# Patient Record
Sex: Male | Born: 1970 | Race: Black or African American | Hispanic: No | Marital: Single | Smoking: Current every day smoker
Health system: Southern US, Community
[De-identification: ages and names within clinical notes are randomized; demographics above are authoritative.]

## PROBLEM LIST (undated history)

## (undated) DIAGNOSIS — E785 Hyperlipidemia, unspecified: Secondary | ICD-10-CM

## (undated) DIAGNOSIS — I739 Peripheral vascular disease, unspecified: Secondary | ICD-10-CM

## (undated) DIAGNOSIS — I10 Essential (primary) hypertension: Secondary | ICD-10-CM

## (undated) DIAGNOSIS — E119 Type 2 diabetes mellitus without complications: Secondary | ICD-10-CM

## (undated) DIAGNOSIS — Z720 Tobacco use: Secondary | ICD-10-CM

## (undated) HISTORY — DX: Essential (primary) hypertension: I10

## (undated) HISTORY — DX: Type 2 diabetes mellitus without complications: E11.9

## (undated) HISTORY — DX: Hyperlipidemia, unspecified: E78.5

---

## 2019-12-10 ENCOUNTER — Other Ambulatory Visit: Payer: Self-pay

## 2019-12-10 ENCOUNTER — Encounter (HOSPITAL_COMMUNITY): Payer: Self-pay

## 2019-12-10 ENCOUNTER — Ambulatory Visit (HOSPITAL_COMMUNITY)
Admission: EM | Admit: 2019-12-10 | Discharge: 2019-12-10 | Disposition: A | Discharge status: Home / Self Care | Payer: Self-pay | Attending: Family Medicine | Admitting: Family Medicine

## 2019-12-10 DIAGNOSIS — M722 Plantar fascial fibromatosis: Secondary | ICD-10-CM

## 2019-12-10 DIAGNOSIS — M79672 Pain in left foot: Secondary | ICD-10-CM

## 2019-12-10 DIAGNOSIS — R030 Elevated blood-pressure reading, without diagnosis of hypertension: Secondary | ICD-10-CM

## 2019-12-10 MED ORDER — MELOXICAM 15 MG PO TABS: 15.0000 mg | ORAL_TABLET | Freq: Every day | ORAL | 0 refills | Status: DC

## 2019-12-10 NOTE — Discharge Instructions (Addendum)
Your blood pressure was noted to be elevated during your visit today. If you are currently taking medication for high blood pressure, please ensure you are taking this as directed. If you do not have a history of high blood pressure and your blood pressure remains persistently elevated, you may need to begin taking a medication at some point. You may return here within the next few days to recheck if unable to see your primary care provider or if do not have a one.  BP (!) 209/101 (BP Location: Right Arm)   Pulse 85   Temp 98.6 F (37 C) (Oral)   Resp 16   SpO2 100%

## 2019-12-10 NOTE — ED Provider Notes (Signed)
[facility]   [id number] 12/10/19 Arrival Time: 1056  ASSESSMENT & PLAN:  1. Foot pain, left   2. Elevated blood pressure reading without diagnosis of hypertension   3. Plantar fasciitis     No indication for plain imaging of foot today. See AVS for written information given. No s/s of hypertensive urgency.  Begin: Meds ordered this encounter  Medications  . meloxicam (MOBIC) 15 MG tablet    Sig: Take 1 tablet (15 mg total) by mouth daily.    Dispense:  14 tablet    Refill:  0    Fitted with CAM boot. WBAT.  Recommend: Follow-up Information    [facility]   Specialty: Specialist Why: If worsening or failing to improve as anticipated. Contact information: [address] [place] [address] [phone]        Schedule an appointment as soon as possible for a visit  with [facility].   Why: To re-check your blood pressure. Contact information: [address] [place] [address] [phone]            Discharge Instructions     Your blood pressure was noted to be elevated during your visit today. If you are currently taking medication for high blood pressure, please ensure you are taking this as directed. If you do not have a history of high blood pressure and your blood pressure remains persistently elevated, you may need to begin taking a medication at some point. You may return here within the next few days to recheck if unable to see your primary care provider or if do not have a one.  BP (!) 209/101 (BP Location: Right Arm)   Pulse 85   Temp 98.6 F (37 C) (Oral)   Resp 16   SpO2 100%        Reviewed expectations re: course of current medical issues. Questions answered. Outlined signs and symptoms indicating need for more acute intervention. Patient verbalized understanding. After Visit Summary given.  SUBJECTIVE: History from: patient. Jeffrey Fitzgerald is a 49 y.o. male who  reports fairly persistent marked pain of his planter left foot; described as aching; without radiation. Onset: gradual. First noted: approx 4 mo ago. Injury/trama: no. Symptoms have waxed and waned since beginning but still present. Worse in the morning. Aggravating factors: weight bearing. Alleviating factors: have not been identified. Associated symptoms: none reported. Extremity sensation changes or weakness: none. Self treatment: occas OTC without much change in symptoms.  History of similar: no.  History reviewed. No pertinent surgical history.   Increased blood pressure noted today. Reports that he has not been treated for hypertension in the past.  He reports no chest pain on exertion, no dyspnea on exertion, no swelling of ankles, no orthostatic dizziness or lightheadedness, no orthopnea or paroxysmal nocturnal dyspnea, no palpitations and no intermittent claudication symptoms.   OBJECTIVE:  Vitals:   12/10/19 1149  BP: (!) 209/101  Pulse: 85  Resp: 16  Temp: 98.6 F (37 C)  TempSrc: Oral  SpO2: 100%    General appearance: alert; no distress HEENT: [place]; AT Neck: supple with FROM CV: reg Resp: unlabored respirations; speaks full sentences without difficulty Extremities: . LLE: warm with well perfused appearance; plantar foot with TTP over insertion of plantar fascia at heal; otherise unremarkable Skin: warm and dry; no visible rashes Neurologic: gait normal; normal sensation and strength of LLE Psychological: alert and cooperative; normal mood and affect    No Known  Allergies  History reviewed. No pertinent past medical history.   Social History   Socioeconomic History  . Marital status: Single    Spouse name: Not on file  . Number of children: Not on file  . Years of education: Not on file  . Highest education level: Not on file  Occupational History  . Not on file  Tobacco Use  . Smoking status: Current Every Day Smoker    Packs/day: 0.50    Types:  Cigarettes  . Smokeless tobacco: Never Used  Substance and Sexual Activity  . Alcohol use: Yes    Comment: occasionally  . Drug use: Not on file  . Sexual activity: Not on file  Other Topics Concern  . Not on file  Social History Narrative  . Not on file   Social Determinants of Health   Financial Resource Strain:   . Difficulty of Paying Living Expenses:   Food Insecurity:   . Worried About Charity fundraiser in the Last Year:   . Arboriculturist in the Last Year:   Transportation Needs:   . Film/video editor (Medical):   Marland Kitchen Lack of Transportation (Non-Medical):   Physical Activity:   . Days of Exercise per Week:   . Minutes of Exercise per Session:   Stress:   . Feeling of Stress :   Social Connections:   . Frequency of Communication with Friends and Family:   . Frequency of Social Gatherings with Friends and Family:   . Attends Religious Services:   . Active Member of Clubs or Organizations:   . Attends Archivist Meetings:   Marland Kitchen Marital Status:    No family history on file. History reviewed. No pertinent surgical history.    Vanessa Kick, MD 12/10/19 1324

## 2019-12-10 NOTE — ED Triage Notes (Signed)
Pt reports left food pain that started in December. States his mom and sister made him come today to get checked out d/t the fact he has had no relief

## 2020-01-09 ENCOUNTER — Ambulatory Visit: Payer: Self-pay

## 2020-01-09 ENCOUNTER — Telehealth: Payer: Self-pay | Admitting: *Deleted

## 2020-01-09 ENCOUNTER — Encounter: Payer: Self-pay | Admitting: General Practice

## 2020-01-09 NOTE — Telephone Encounter (Signed)
Call to patient about missed appointment .  Message left that Clinics had called and to call to reschedule if needed.  Angelina Ok, RN 01/09/2020 11:28 AM.

## 2020-01-12 ENCOUNTER — Emergency Department (HOSPITAL_COMMUNITY): Payer: Self-pay

## 2020-01-12 ENCOUNTER — Encounter (HOSPITAL_COMMUNITY): Payer: Self-pay | Admitting: Emergency Medicine

## 2020-01-12 ENCOUNTER — Other Ambulatory Visit: Payer: Self-pay

## 2020-01-12 ENCOUNTER — Emergency Department (HOSPITAL_COMMUNITY)
Admission: EM | Admit: 2020-01-12 | Discharge: 2020-01-12 | Disposition: A | Discharge status: Home / Self Care | Payer: Self-pay | Attending: Emergency Medicine | Admitting: Emergency Medicine

## 2020-01-12 DIAGNOSIS — R030 Elevated blood-pressure reading, without diagnosis of hypertension: Secondary | ICD-10-CM | POA: Insufficient documentation

## 2020-01-12 DIAGNOSIS — F17210 Nicotine dependence, cigarettes, uncomplicated: Secondary | ICD-10-CM | POA: Insufficient documentation

## 2020-01-12 DIAGNOSIS — M79672 Pain in left foot: Secondary | ICD-10-CM | POA: Insufficient documentation

## 2020-01-12 LAB — CBC WITH DIFFERENTIAL/PLATELET
Abs Immature Granulocytes: 0.03 10*3/uL (ref 0.00–0.07)
Basophils Absolute: 0 10*3/uL (ref 0.0–0.1)
Basophils Relative: 0 %
Eosinophils Absolute: 0.1 10*3/uL (ref 0.0–0.5)
Eosinophils Relative: 1 %
HCT: 48.1 % (ref 39.0–52.0)
Hemoglobin: 15.6 g/dL (ref 13.0–17.0)
Immature Granulocytes: 0 %
Lymphocytes Relative: 37 %
Lymphs Abs: 4.3 10*3/uL — ABNORMAL HIGH (ref 0.7–4.0)
MCH: 28.8 pg (ref 26.0–34.0)
MCHC: 32.4 g/dL (ref 30.0–36.0)
MCV: 88.7 fL (ref 80.0–100.0)
Monocytes Absolute: 0.9 10*3/uL (ref 0.1–1.0)
Monocytes Relative: 7 %
Neutro Abs: 6.4 10*3/uL (ref 1.7–7.7)
Neutrophils Relative %: 55 %
Platelets: 258 10*3/uL (ref 150–400)
RBC: 5.42 MIL/uL (ref 4.22–5.81)
RDW: 14.6 % (ref 11.5–15.5)
WBC: 11.7 10*3/uL — ABNORMAL HIGH (ref 4.0–10.5)
nRBC: 0 % (ref 0.0–0.2)

## 2020-01-12 LAB — BASIC METABOLIC PANEL
Anion gap: 11 (ref 5–15)
BUN: 15 mg/dL (ref 6–20)
CO2: 27 mmol/L (ref 22–32)
Calcium: 9.2 mg/dL (ref 8.9–10.3)
Chloride: 99 mmol/L (ref 98–111)
Creatinine, Ser: 1.31 mg/dL — ABNORMAL HIGH (ref 0.61–1.24)
GFR calc Af Amer: >60 mL/min (ref >60)
GFR calc non Af Amer: >60 mL/min (ref >60)
Glucose, Bld: 93 mg/dL (ref 70–99)
Potassium: 4.8 mmol/L (ref 3.5–5.1)
Sodium: 137 mmol/L (ref 135–145)

## 2020-01-12 MED ORDER — CYCLOBENZAPRINE HCL 10 MG PO TABS: 10.0000 mg | ORAL_TABLET | Freq: Every day | ORAL | 0 refills | Status: DC

## 2020-01-12 MED ORDER — AMLODIPINE BESYLATE 5 MG PO TABS: 5.0000 mg | ORAL_TABLET | Freq: Every day | ORAL | 0 refills | Status: DC

## 2020-01-12 MED ORDER — DICLOFENAC SODIUM 1 % EX GEL: 4.0000 g | Freq: Four times a day (QID) | CUTANEOUS | 0 refills | Status: DC

## 2020-01-12 MED ORDER — HYDROCODONE-ACETAMINOPHEN 5-325 MG PO TABS
1.0000 | ORAL_TABLET | Freq: Once | ORAL | Status: AC
  Administered 2020-01-12: 1 via ORAL
  Filled 2020-01-12: qty 1

## 2020-01-12 MED ORDER — CLONIDINE HCL 0.1 MG PO TABS: 0.1000 mg | ORAL_TABLET | Freq: Once | ORAL | Status: DC

## 2020-01-12 NOTE — Discharge Instructions (Addendum)
You have been seen today for foot pain. Please read and follow all provided instructions. Return to the emergency room for worsening condition or new concerning symptoms.    1. Medications:  -Prescription sent to your pharmacy for amlodipine.  This is a medication used to treat high blood pressure.  Please take as prescribed.  This medicine can cause swelling to your legs, if you notice that please return to the emergency department for evaluation. -Prescription also sent for Voltaren gel.  This is used to help treat pain.  Applied here foot as needed. -Prescription also sent for Flexeril.  This is a muscle relaxer.  It can help with pain.  This medicine can make you drowsy so do not take if you are going to be working or driving.  Continue usual home medications Take medications as prescribed. Please review all of the medicines and only take them if you do not have an allergy to them.   2. Treatment: rest.  Wear the Ace wrap for comfort.  Elevate your foot when you have pain.  You can also try getting insoles for your shoes.  3. Follow Up:  Please follow up with [facility] to get your blood pressure rechecked within 1 week.  I have given you the contact information above.  Call the office to schedule an example appointment.   It is also a possibility that you have an allergic reaction to any of the medicines that you have been prescribed - Everybody reacts differently to medications and while MOST people have no trouble with most medicines, you may have a reaction such as nausea, vomiting, rash, swelling, shortness of breath. If this is the case, please stop taking the medicine immediately and contact your physician.  ?

## 2020-01-12 NOTE — ED Provider Notes (Signed)
[facility]-EMERGENCY DEPT Provider Note   CSN: [id number] Arrival date & time: 01/12/20  1230     History Chief Complaint  Patient presents with  . Foot Pain    Jeffrey Fitzgerald is a 49 y.o. male with no known past medical history presenting to emergency department today with chief complaint of intermittent left foot pain x5 months.  Patient states his pain has progressively worsened over the last 2 days.  He is reporting a sharp pain in his left foot that radiates up his left leg.  He denies any recent fall, injury or trauma to the ankle.  He states he was seen by urgent care last month and was told he had plantar fasciitis and prescribed meloxicam.  He denies any improvement with the pain despite taking the meloxicam.  He reports that his pain is worse with ambulation.  He has not tried any other over-the-counter medications.  He denies any fever, chills, headache, neck pain, chest pain, abdominal pain, nausea, vomiting, numbness, weakness, decreased sensation in left lower extremity.    History reviewed. No pertinent past medical history.  There are no problems to display for this patient.   History reviewed. No pertinent surgical history.     No family history on file.  Social History   Tobacco Use  . Smoking status: Current Every Day Smoker    Packs/day: 0.50    Types: Cigarettes  . Smokeless tobacco: Never Used  Substance Use Topics  . Alcohol use: Yes    Comment: occasionally  . Drug use: Not on file    Home Medications Prior to Admission medications   Medication Sig Start Date End Date Taking? Authorizing Provider  amLODipine (NORVASC) 5 MG tablet Take 1 tablet (5 mg total) by mouth daily. 01/12/20 02/11/20  Harrold Fitchett E, PA-C  cyclobenzaprine (FLEXERIL) 10 MG tablet Take 1 tablet (10 mg total) by mouth at bedtime. 01/12/20   Burnie Hank E, PA-C  diclofenac Sodium (VOLTAREN) 1 % GEL Apply 4 g topically 4 (four) times daily. 01/12/20    Layaan Mott E, PA-C  meloxicam (MOBIC) 15 MG tablet Take 1 tablet (15 mg total) by mouth daily. 12/10/19   Mardella Layman, MD    Allergies    Patient has no known allergies.  Review of Systems   Review of Systems All other systems are reviewed and are negative for acute change except as noted in the HPI.  Physical Exam Updated Vital Signs BP (!) 164/114 (BP Location: Left Arm)   Pulse (!) 104   Temp 98.3 F (36.8 C) (Oral)   Resp 17   SpO2 96%   Physical Exam Vitals and nursing note reviewed.  Constitutional:      General: He is not in acute distress.    Appearance: He is not ill-appearing.  HENT:     Head: Normocephalic and atraumatic.     Right Ear: Tympanic membrane and external ear normal.     Left Ear: Tympanic membrane and external ear normal.     Nose: Nose normal.     Mouth/Throat:     Mouth: Mucous membranes are moist.     Pharynx: Oropharynx is clear.  Eyes:     General: No scleral icterus.       Right eye: No discharge.        Left eye: No discharge.     Extraocular Movements: Extraocular movements intact.     Conjunctiva/sclera: Conjunctivae normal.     Pupils: Pupils are equal,  round, and reactive to light.  Neck:     Vascular: No JVD.  Cardiovascular:     Rate and Rhythm: Normal rate and regular rhythm.     Pulses: Normal pulses.          Radial pulses are 2+ on the right side and 2+ on the left side.       Dorsalis pedis pulses are 2+ on the right side and 2+ on the left side.     Heart sounds: Normal heart sounds.  Pulmonary:     Comments: Lungs clear to auscultation in all fields. Symmetric chest rise. No wheezing, rales, or rhonchi. Abdominal:     Comments: Abdomen is soft, non-distended, and non-tender in all quadrants. No rigidity, no guarding. No peritoneal signs.  Musculoskeletal:        General: Normal range of motion.     Cervical back: Normal range of motion.     Comments: Homans sign absent bilaterally, no lower extremity edema, no  palpable cords, compartments are soft.   There is no swelling noted to left foot or lower extremity.  He does have bony tenderness over forefoot and medial malleolus.  No overt deformity.  No tenderness to palpation of base of fifth metatarsal.  The ankle joint is intact. No break in skin. Good pedal pulse and cap refill of all toes. Wiggling toes without difficulty.   Skin:    General: Skin is warm and dry.     Capillary Refill: Capillary refill takes less than 2 seconds.  Neurological:     Mental Status: He is oriented to person, place, and time.     GCS: GCS eye subscore is 4. GCS verbal subscore is 5. GCS motor subscore is 6.     Comments: Speech is clear and goal oriented, follows commands CN III-XII intact, no facial droop Normal strength in upper and lower extremities bilaterally including dorsiflexion and plantar flexion, strong and equal grip strength Sensation normal to light and sharp touch Moves extremities without ataxia, coordination intact Normal finger to nose and rapid alternating movements Normal balance, mildly antalgic gait   Psychiatric:        Behavior: Behavior normal.       ED Results / Procedures / Treatments   Labs (all labs ordered are listed, but only abnormal results are displayed) Labs Reviewed  CBC WITH DIFFERENTIAL/PLATELET - Abnormal; Notable for the following components:      Result Value   WBC 11.7 (*)    Lymphs Abs 4.3 (*)    All other components within normal limits  BASIC METABOLIC PANEL - Abnormal; Notable for the following components:   Creatinine, Ser 1.31 (*)    All other components within normal limits    EKG None  Radiology DG Ankle Complete Left  Result Date: 01/12/2020 CLINICAL DATA:  Plantar pain for several months, no known injury, initial encounter EXAM: LEFT ANKLE COMPLETE - 3+ VIEW COMPARISON:  None. FINDINGS: There is no evidence of fracture, dislocation, or joint effusion. There is no evidence of arthropathy or other  focal bone abnormality. Soft tissues are unremarkable. IMPRESSION: No acute abnormality noted. Electronically Signed   By: Inez Catalina M.D.   On: 01/12/2020 14:44   DG Foot Complete Left  Result Date: 01/12/2020 CLINICAL DATA:  Plantar pain for several months, no known injury, initial encounter EXAM: LEFT FOOT - COMPLETE 3+ VIEW COMPARISON:  None. FINDINGS: There is no evidence of fracture or dislocation. There is no evidence of arthropathy or  other focal bone abnormality. Soft tissues are unremarkable. IMPRESSION: No acute abnormality noted. Electronically Signed   By: Alcide Clever M.D.   On: 01/12/2020 14:45    Procedures Procedures (including critical care time)  Medications Ordered in ED Medications  HYDROcodone-acetaminophen (NORCO/VICODIN) 5-325 MG per tablet 1 tablet (1 tablet Oral Given 01/12/20 1439)    ED Course  I have reviewed the triage vital signs and the nursing notes.  Pertinent labs & imaging results that were available during my care of the patient were reviewed by me and considered in my medical decision making (see chart for details).  Vitals:   01/12/20 1243 01/12/20 1400 01/12/20 1536  BP: (!) 164/114 (!) 204/154 (!) 178/104  Pulse: (!) 104 93 98  Resp: 17 16 18   Temp: 98.3 F (36.8 C)    TempSrc: Oral    SpO2: 96% 98% 95%      MDM Rules/Calculators/A&P                      History provided by patient with additional history obtained from chart review.  I reviewed patient's urgent care visit from 12/10/2019.  He was discharged with prescription for meloxicam and given a walking boot.  It was recommended he follow up with [facility] for blood pressure recheck. There is also a telephone note from 01/09/2020 that patient missed his appointment and it was recommended he call to reschedule.   Patient seen and examined. Patient presents awake, alert, hemodynamically stable, afebrile, non toxic. His blood pressure on arrival is 164/114 he is tachycardic  to 104.  On my exam I rechecked pressure and it is 204/154 and his heart rate is 93.  Patient tells me he has not seen a primary care doctor since he was a child.  His ankle has no obvious deformity.  He has tenderness palpation of forefoot and medial malleolus.  No tenderness to base of fifth metatarsal.  There are no open wounds.  Brisk cap refill.  He is able to ambulate with mildly antalgic gait.  Negative Homans' sign.  Neuro exam is normal except that patient ambulates with mildly antalgic gait.  No focal weakness.  Strong equal strength in bilateral upper and lower extremities.  Left lower extremity is appropriately warm to the touch and has a well perfused appearance. Left lower extremity is neurovascularly intact.  He continues to deny any headache, neck pain, chest pain, back pain.  Basic labs checked given he has had multiple readings of elevated high blood pressure rule out endorgan damage.  CBC with nonspecific leukocytosis of 11.7, without left shift. No anemia. BMP shows no severe electrolyte derangement. He does have elevated creatinine at 1.31, no prior to compare. BUN is normal at 15. Xrays of left ankle and foot viewed by me are negative for dislocation or fracture.   Patient given pain medication.  Blood pressure was rechecked and is improved at 178/104.  I discussed patient with ED attending Dr. 03/10/2020 who agrees with plan to start amlodipine. His creatinine is slightly elevated but we feel he does not need admission for hypertensive emergency at this time.   With his BP improving and negative films, will discharge home with symptomatic care and start patient on amlodipine.  Discussed the possible side effect of peripheral edema and signs and symptoms that would warrant him to return to emergency department. Also recommend he follow up with orthopedics to further evaluate his pain.  The patient appears reasonably screened  and/or stabilized for discharge and I doubt any other medical  condition or other [facility] requiring further screening, evaluation, or treatment in the ED at this time prior to discharge. The patient is safe for discharge with strict return precautions discussed.    Portions of this note were generated with Scientist, clinical (histocompatibility and immunogenetics). Dictation errors may occur despite best attempts at proofreading.    Final Clinical Impression(s) / ED Diagnoses Final diagnoses:  Elevated blood pressure reading  Left foot pain    Rx / DC Orders ED Discharge Orders         Ordered    amLODipine (NORVASC) 5 MG tablet  Daily     01/12/20 1603    cyclobenzaprine (FLEXERIL) 10 MG tablet  Daily at bedtime     01/12/20 1603    diclofenac Sodium (VOLTAREN) 1 % GEL  4 times daily     01/12/20 1603           Apryle Stowell, Keddie 01/12/20 1657    Lorre Nick, MD 01/13/20 1215

## 2020-01-12 NOTE — ED Triage Notes (Addendum)
Per EMS, patient from home, c/o pain in left foot after injury in December. Seen at UC last week and given boot and meloxicam. States dx with plantar fasciitis Reports he is out of meloxicam. Hypertensive with EMS. Denies BP prescription. Denies headache, chest pain, SOB.

## 2020-01-12 NOTE — Progress Notes (Signed)
Orthopedic Tech Progress Note Patient Details:  Jeffrey Fitzgerald 1970/12/05 [id number]  Ortho Devices Type of Ortho Device: Ace wrap, Postop shoe/boot Ortho Device/Splint Interventions: Application   Post Interventions Patient Tolerated: Well Instructions Provided: Care of device   Saul Fordyce 01/12/2020, 4:35 PM

## 2020-01-25 ENCOUNTER — Other Ambulatory Visit: Payer: Self-pay

## 2020-01-25 ENCOUNTER — Ambulatory Visit: Payer: Self-pay | Admitting: Internal Medicine

## 2020-01-25 DIAGNOSIS — M79672 Pain in left foot: Secondary | ICD-10-CM

## 2020-01-25 DIAGNOSIS — I10 Essential (primary) hypertension: Secondary | ICD-10-CM

## 2020-01-25 MED ORDER — HYDROCHLOROTHIAZIDE 25 MG PO TABS: 25.0000 mg | ORAL_TABLET | Freq: Every day | ORAL | 0 refills | Status: DC

## 2020-01-25 MED ORDER — AMLODIPINE BESYLATE 5 MG PO TABS: 5.0000 mg | ORAL_TABLET | Freq: Every day | ORAL | 0 refills | Status: DC

## 2020-01-25 MED ORDER — DICLOFENAC SODIUM 1 % EX GEL: 4.0000 g | Freq: Four times a day (QID) | CUTANEOUS | 0 refills | Status: DC

## 2020-01-25 NOTE — Assessment & Plan Note (Addendum)
Patient states that he has been having left foot pain for the past few months.Paitent started noticing pain on the plantar aspect of his foot since December 2020. He has noticed the pain every 2 weeks since that time.   The patient now presents with severe pain in the the distal aspect of the left foot especially over his toes, worsened when he puts his foot down, when he walks around, and at nighttime. Alleviated with voltaren gel and flexeril. Left foot xray and ankle 01/12/20 without any fracture or bony abnormality. Patient is sitting in a wheelchair and tearful on exam secondary to pain. On physical exam the patient's pain is localized to metatarsal region on plantar aspect of foot with notable pain in first and 3rd metatarsal joints.   Assessment and plan  Patient's signs and symptoms are concerning for metatarsalgia vs gout vs pad (given patient's risk factor of smoking). Patient's severe pain appears out of proportion to exam and history of alleviation with voltaren gel. Ordered ABIs which showed that bilateral pad. Advised to stop smoking. Will send to vascular surgery. Symptomatic relief with voltaren gel, ice, and low dose ibuprofen.

## 2020-01-25 NOTE — Assessment & Plan Note (Signed)
Patient's blood pressure during this visit is 173/116. He has been prescribed amlodipine 5mg  qd that he has been taking for the past 2 weeks. Bmp shows elevated creatinine of 1.31. Electrolytes within normal range.   Denies headaches, chest pain, palpitations, dyspnea, blurry vision.   Assessment and plan  Patient's blood pressure is not under control. Will add hctz 25mg  to amlodipine 5mg  qd. Counseled on smoking cessation.

## 2020-01-25 NOTE — Progress Notes (Signed)
Internal Medicine Clinic Attending  I saw and evaluated the patient.  I personally confirmed the key portions of the history and exam documented by Dr. Delma Officer and I reviewed pertinent patient test results.  The assessment, diagnosis, and plan were formulated together and I agree with the documentation in the resident's note.   49 year old patient here to establish care and with acute complaint of progressive severe left fore-foot pain. On exam, he has tinea pedis, onychomycosis, no ulcers or injury to the foot, pain with even light palpation of the toes, top and bottom of the foot. I could not palpate a DP pulse, foot is warm. Office ABI suggests severe PAD, wave forms are very diminished in both legs. He uses tobacco so is at risk of large vessel disease. No reliable rest pain or ulcers, so I don't think this is critical limb ischemia. Will refer to vascular surgery, lack of insurance may be a barrier to accessing specialty care, will try to work through this. Medical management for now with aspirin and strongly encouraged tobacco cessation.

## 2020-01-25 NOTE — Progress Notes (Signed)
   CC: Left foot pain   HPI:  [name] is a 49 y.o. with recent diagnosis of hypertension and left foot pain who comes to establish care and follow up on these problems. Please see problem based charting for evaluation, assessment, and plan. The patient has not had a pcp in the past 30 yrs.   Past Medical History:  Diagnosis Date  . Hypertension      Family History  Problem Relation Age of Onset  . Hypertension Father   . Heart disease Father    Social History   Socioeconomic History  . Marital status: Single    Spouse name: Not on file  . Number of children: Not on file  . Years of education: Not on file  . Highest education level: Not on file  Occupational History  . Not on file  Tobacco Use  . Smoking status: Current Every Day Smoker    Packs/day: 0.50    Years: 30.00    Pack years: 15.00    Types: Cigarettes  . Smokeless tobacco: Never Used  Substance and Sexual Activity  . Alcohol use: Yes    Comment: occasionally  . Drug use: Yes    Types: Marijuana  . Sexual activity: Not on file  Other Topics Concern  . Not on file  Social History Narrative  . Not on file   Social Determinants of Health   Financial Resource Strain:   . Difficulty of Paying Living Expenses:   Food Insecurity:   . Worried About Programme researcher, broadcasting/film/video in the Last Year:   . Barista in the Last Year:   Transportation Needs:   . Freight forwarder (Medical):   Marland Kitchen Lack of Transportation (Non-Medical):   Physical Activity:   . Days of Exercise per Week:   . Minutes of Exercise per Session:   Stress:   . Feeling of Stress :   Social Connections:   . Frequency of Communication with Friends and Family:   . Frequency of Social Gatherings with Friends and Family:   . Attends Religious Services:   . Active Member of Clubs or Organizations:   . Attends Banker Meetings:   Marland Kitchen Marital Status:     Review of Systems:    Per hpi  Physical Exam:  Vitals:   01/25/20 0941  BP: (!) 173/116  Pulse: (!) 113  Temp: 98.2 F (36.8 C)  TempSrc: Oral  SpO2: 100%  Weight: 145 lb 11.2 oz (66.1 kg)  Height: 5\' 5"  (1.651 m)   Physical Exam  Constitutional: He appears well-developed and well-nourished. No distress.  HENT:  Head: Normocephalic and atraumatic.  Cardiovascular: Normal rate, regular rhythm and normal heart sounds.  Faint dp palpable  Respiratory: Effort normal and breath sounds normal. No respiratory distress. He has no wheezes.  GI: Soft. Bowel sounds are normal. He exhibits no distension. There is no abdominal tenderness.  Musculoskeletal:        General: No edema.     Comments: ttp in metatarsal region most notably in first and third metatarsal joints. Decreased rom in toes.   Neurological: He is alert.  Skin: He is not diaphoretic.  Psychiatric: He has a normal mood and affect. His behavior is normal. Judgment and thought content normal.   Assessment & Plan:   See Encounters Tab for problem based charting.  Patient discussed with Dr. 

## 2020-01-25 NOTE — Patient Instructions (Addendum)
It was a pleasure to see you today Mr. Duell. Please make the following changes:  It appears that you may have peripheral artery disease. We will refer you to a vascular surgeon -please start taking aspirin 81mg  daily -please stop smoking -please continue to use voltaren gel as needed on the left foot  -please take ibuprofen 200mg  every 6hrs as needed for the pain  -apply ice to the left foot  -please start using hydrochlorothiazide in addition to amlodipine for your blood pressure  -follow up in 2 weeks for blood pressure recheck   If you have any questions or concerns, please call our clinic at [phone] between 9am-5pm and after hours call [phone] and ask for the internal medicine resident on call. If you feel you are having a medical emergency please call 911.   Thank you, we look forward to help you remain healthy!  [phone], MD Internal Medicine PGY3   To schedule an appointment for a COVID vaccine or be added to the vaccine wait list: Go to [phone]   OR Go to Lorenso Courier                  OR Call [phone]                                     OR Call [phone] and select Option 2  [phone] [facility] [place]) [place] [facility]. [place], [facility] [place] Hours: Mon,Thu 8-5, Sat 8-12 Type: Pfizer (12+)  [place]  [facility] [place]) [facility] [employer] [address] [place], [place] Hours: Thu: 1-5 Type: Pfizer (12+)    Atherosclerosis  Atherosclerosis is narrowing and hardening of the arteries. Arteries are blood vessels that carry blood from the heart to all parts of the body. This blood contains oxygen. Arteries can become narrow or clogged with a buildup of fat, cholesterol, calcium, and other substances (plaque). Plaque decreases the amount of blood that can flow through the artery. Atherosclerosis can affect any artery in the body, including:  Heart arteries (coronary  artery disease). This may cause a heart attack.  Brain arteries. This may cause a stroke (cerebrovascular accident).  Leg, arm, and pelvis arteries (peripheral artery disease). This may cause pain and numbness.  Kidney arteries. This may cause kidney (renal) failure. Treatment may slow the disease and prevent further damage to the heart, brain, peripheral arteries, and kidneys. What are the causes? Atherosclerosis develops slowly over many years. The inner layers of your arteries become damaged and allow the gradual buildup of plaque. The exact cause of atherosclerosis is not fully understood. Symptoms of atherosclerosis do not occur until the artery becomes narrow or blocked. What increases the risk? The following factors may make you more likely to develop this condition:  High blood pressure.  High cholesterol.  Being middle-aged or older.  Having a family history of atherosclerosis.  Having high blood fats (triglycerides).  Diabetes.  Being overweight.  Smoking tobacco.  Not exercising enough (sedentary lifestyle).  Having a substance in the blood called C-reactive protein (CRP). This is a sign of increased levels of inflammation in the body.  Sleep apnea.  Being stressed.  Drinking too much alcohol. What are the signs or symptoms? This condition may not cause any symptoms. If you have symptoms, they are caused by damage to an area of your body that is not getting enough blood.  Coronary  artery disease may cause chest pain and shortness of breath.  Decreased blood supply to your brain may cause a stroke. Signs of a stroke may include sudden: ? Weakness on one side of the body. ? Confusion. ? Changes in vision. ? Inability to speak or understand speech. ? Loss of balance, coordination, or the ability to walk. ? Severe headache. ? Loss of consciousness.  Peripheral arterial disease may cause pain and numbness, often in the legs and hips.  Renal failure may  cause fatigue, nausea, swelling, and itchy skin. How is this diagnosed? This condition is diagnosed based on your medical history and a physical exam. During the exam:  Your health care provider will: ? Check your pulse in different places. ? Listen for a "whooshing" sound over your arteries (bruit).  You may have tests, such as: ? Blood tests to check your levels of cholesterol, triglycerides, and CRP. ? Electrocardiogram (ECG) to check for heart damage. ? Chest X-ray to see if you have an enlarged heart, which is a sign of heart failure. ? Stress test to see how your heart reacts to exercise. ? Echocardiogram to get images of the inside of your heart. ? Ankle-brachial index to compare blood pressure in your arms to blood pressure in your ankles. ? Ultrasound of your peripheral arteries to check blood flow. ? CT scan to check for damage to your heart or brain. ? X-rays of blood vessels after dye has been injected (angiogram) to check blood flow. How is this treated? Treatment starts with lifestyle changes, which may include:  Changing your diet.  Losing weight.  Reducing stress.  Exercising and being physically active more regularly.  Not smoking. You may also need medicine to:  Lower triglycerides and cholesterol.  Control blood pressure.  Prevent blood clots.  Lower inflammation in your body.  Control your blood sugar. Sometimes, surgery is needed to:  Remove plaque from an artery (endarterectomy).  Open or widen a narrowed heart artery (angioplasty).  Create a new path for your blood with one of these procedures: ? Heart (coronary) artery bypass graft surgery. ? Peripheral artery bypass graft surgery. Follow these instructions at home: Eating and drinking   Eat a heart-healthy diet. Talk with your health care provider or a diet and nutrition specialist (dietitian) if you need help. A heart-healthy diet involves: ? Limiting unhealthy fats and increasing  healthy fats. Some examples of healthy fats are olive oil and canola oil. ? Eating plant-based foods, such as fruits, vegetables, nuts, whole grains, and legumes (such as peas and lentils).  Limit alcohol intake to no more than 1 drink a day for nonpregnant women and 2 drinks a day for men. One drink equals 12 oz of beer, 5 oz of wine, or 1 oz of hard liquor. Lifestyle  Follow an exercise program as told by your health care provider.  Maintain a healthy weight. Lose weight if your health care provider says that you need to do that.  Rest when you are tired.  Learn to manage your stress.  Do not use any products that contain nicotine or tobacco, such as cigarettes and e-cigarettes. If you need help quitting, ask your health care provider.  Do not abuse drugs. General instructions  Take over-the-counter and prescription medicines only as told by your health care provider.  Manage other health conditions as told by your health care provider.  Keep all follow-up visits as told by your health care provider. This is important. Contact a health care  provider if:  You have chest pain or discomfort. This includes squeezing chest pain that may feel like indigestion (angina).  You have shortness of breath.  You have an irregular heartbeat.  You have unexplained fatigue.  You have unexplained pain or numbness in an arm, leg, or hip.  You have nausea, swelling of your hands or feet, and itchy skin. Get help right away if:  You have any symptoms of a heart attack, such as: ? Chest pain. ? Shortness of breath. ? Pain in your neck, jaw, arms, back, or stomach. ? Cold sweat. ? Nausea. ? Light-headedness.  You have any symptoms of a stroke. "BE FAST" is an easy way to remember the main warning signs of a stroke: ? B - Balance. Signs are dizziness, sudden trouble walking, or loss of balance. ? E - Eyes. Signs are trouble seeing or a sudden change in vision. ? F - Face. Signs are  sudden weakness or numbness of the face, or the face or eyelid drooping on one side. ? A - Arms. Signs are weakness or numbness in an arm. This happens suddenly and usually on one side of the body. ? S - Speech. Signs are sudden trouble speaking, slurred speech, or trouble understanding what people say. ? T - Time. Time to call emergency services. Write down what time symptoms started.  You have other signs of a stroke, such as: ? A sudden, severe headache with no known cause. ? Nausea or vomiting. ? Seizure. These symptoms may represent a serious problem that is an emergency. Do not wait to see if the symptoms will go away. Get medical help right away. Call your local emergency services (911 in the [place] Do not drive yourself to the hospital. Summary  Atherosclerosis is narrowing and hardening of the arteries.  Arteries can become narrow or clogged with a buildup of fat, cholesterol, calcium, and other substances (plaque).  This condition may not cause any symptoms. If you do have symptoms, they are caused by damage to an area of your body that is not getting enough blood.  Treatment may include lifestyle changes and medicines. In some cases, surgery is needed. This information is not intended to replace advice given to you by your health care provider. Make sure you discuss any questions you have with your health care provider. Document Revised: 11/25/2017 Document Reviewed: 04/21/2017 Elsevier Patient Education  [place].

## 2020-02-20 ENCOUNTER — Ambulatory Visit: Payer: Self-pay

## 2020-03-06 ENCOUNTER — Encounter: Payer: Self-pay | Admitting: Internal Medicine

## 2020-04-07 NOTE — Addendum Note (Signed)
Addended by: Neomia Dear on: 04/07/2020 07:14 PM   Modules accepted: Orders

## 2021-02-06 ENCOUNTER — Encounter: Payer: Self-pay | Admitting: *Deleted

## 2021-05-02 IMAGING — CR DG FOOT COMPLETE 3+V*L*
3 series · 3 of 3 positions shown · non-contrast
Comparison: None.

CLINICAL DATA: Plantar pain for several months, no known injury,
initial encounter

EXAM:
LEFT FOOT - COMPLETE 3+ VIEW

[x foot lat left]
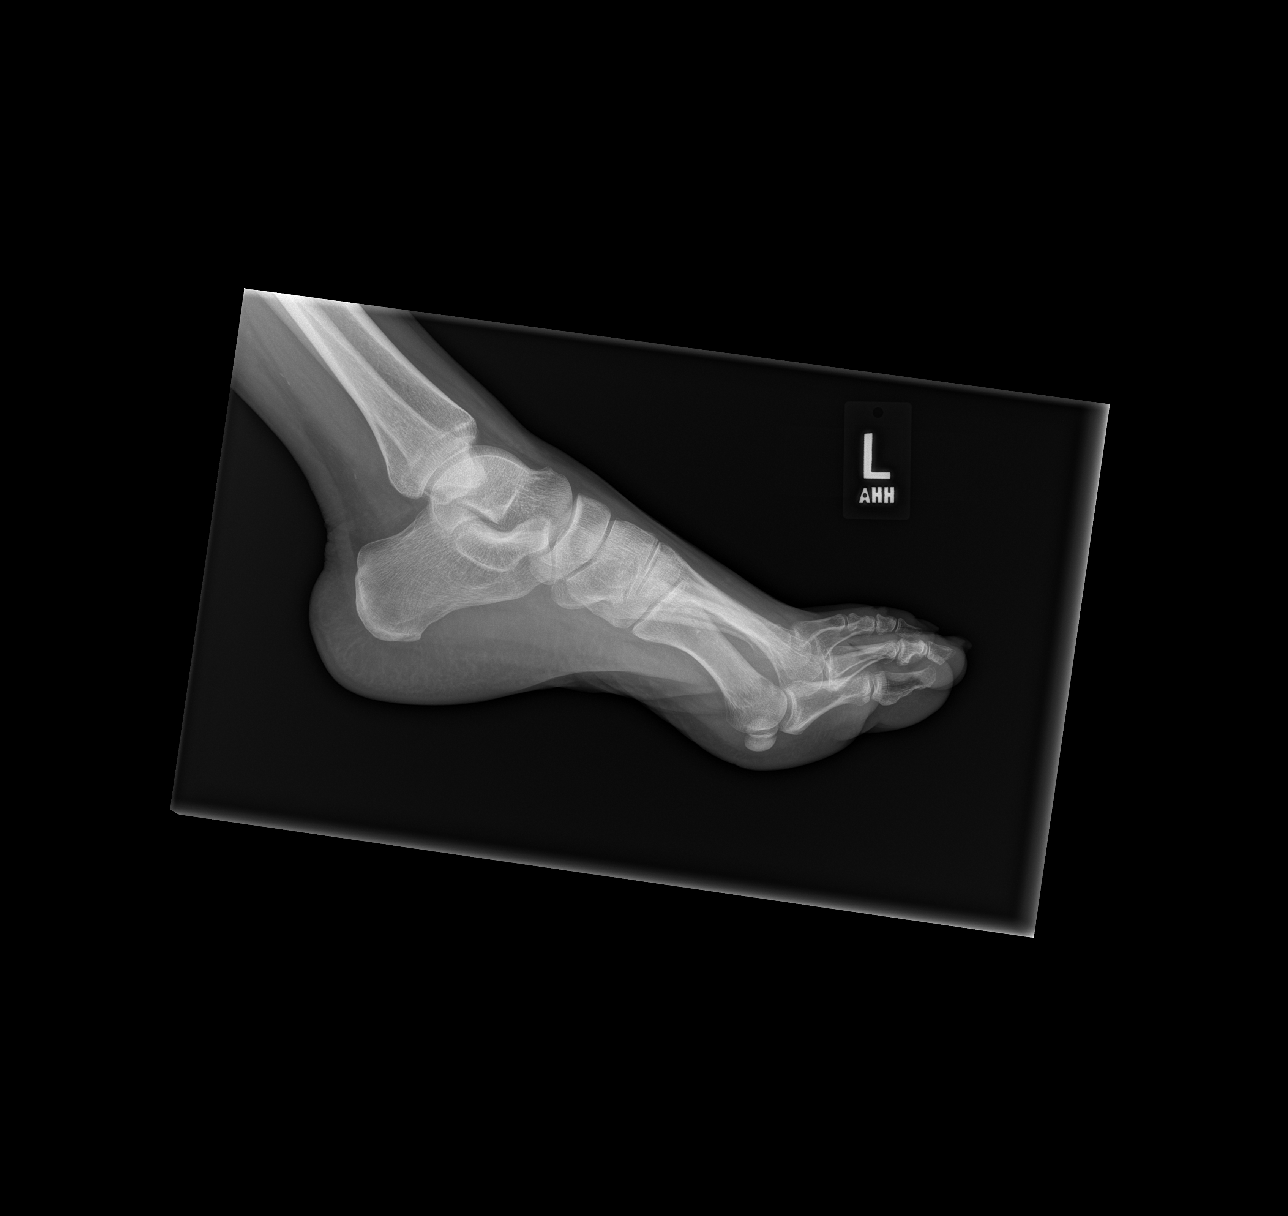

[x foot ap left]
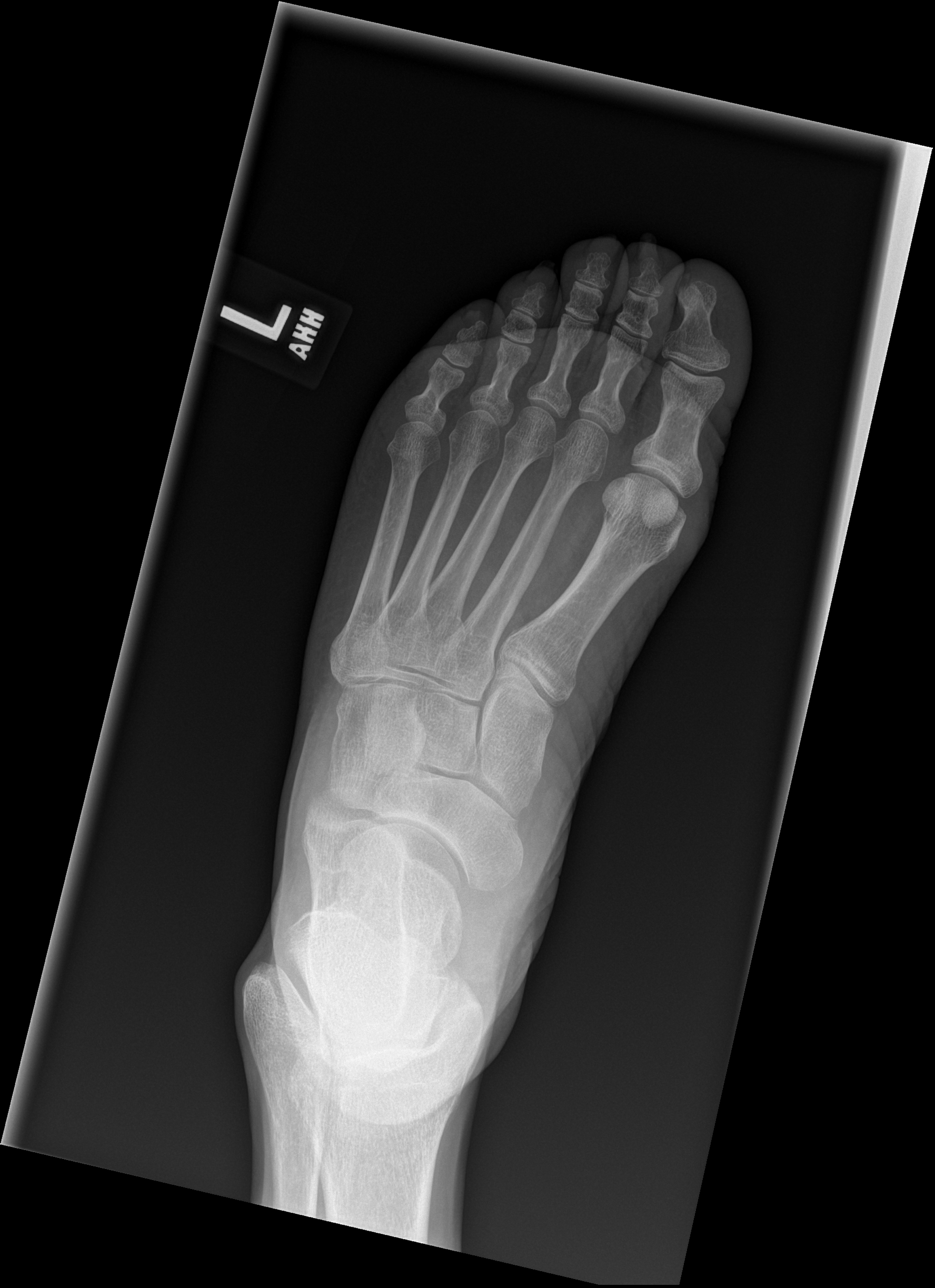

[x foot obl left]
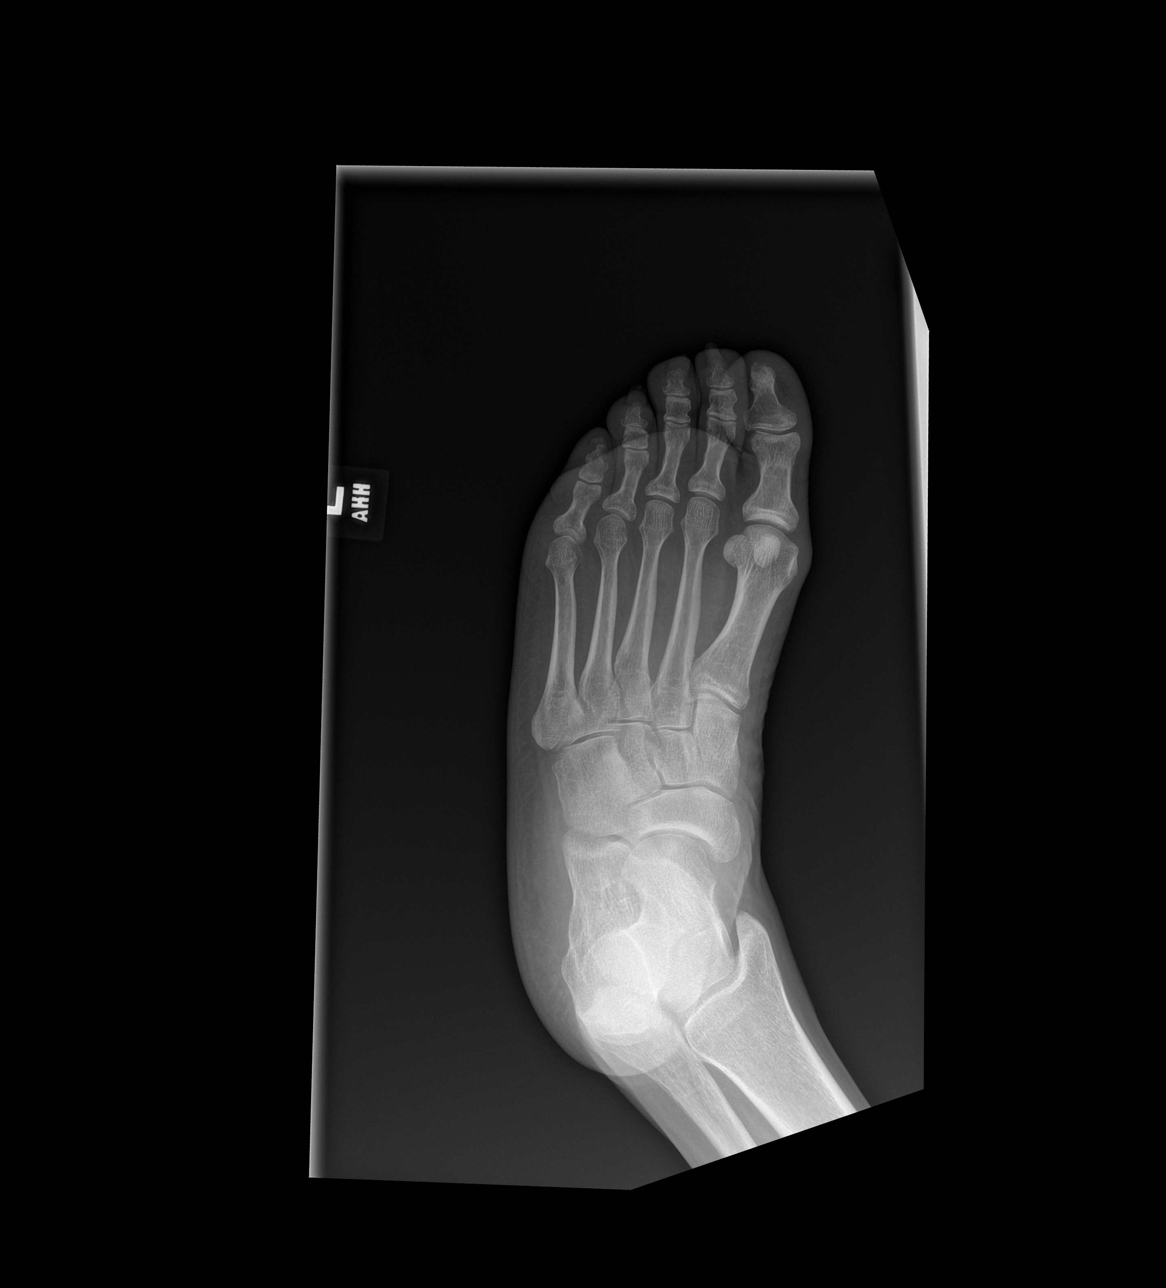

[3 of 3 positions shown; findings below may reference images not displayed]

FINDINGS: There is no evidence of fracture or dislocation. There is no
evidence of arthropathy or other focal bone abnormality. Soft
tissues are unremarkable.
IMPRESSION: No acute abnormality noted.

## 2021-05-02 IMAGING — CR DG ANKLE COMPLETE 3+V*L*
3 series · 3 of 3 positions shown · non-contrast
Comparison: None.

CLINICAL DATA: Plantar pain for several months, no known injury,
initial encounter

EXAM:
LEFT ANKLE COMPLETE - 3+ VIEW

[x ankle ap left]
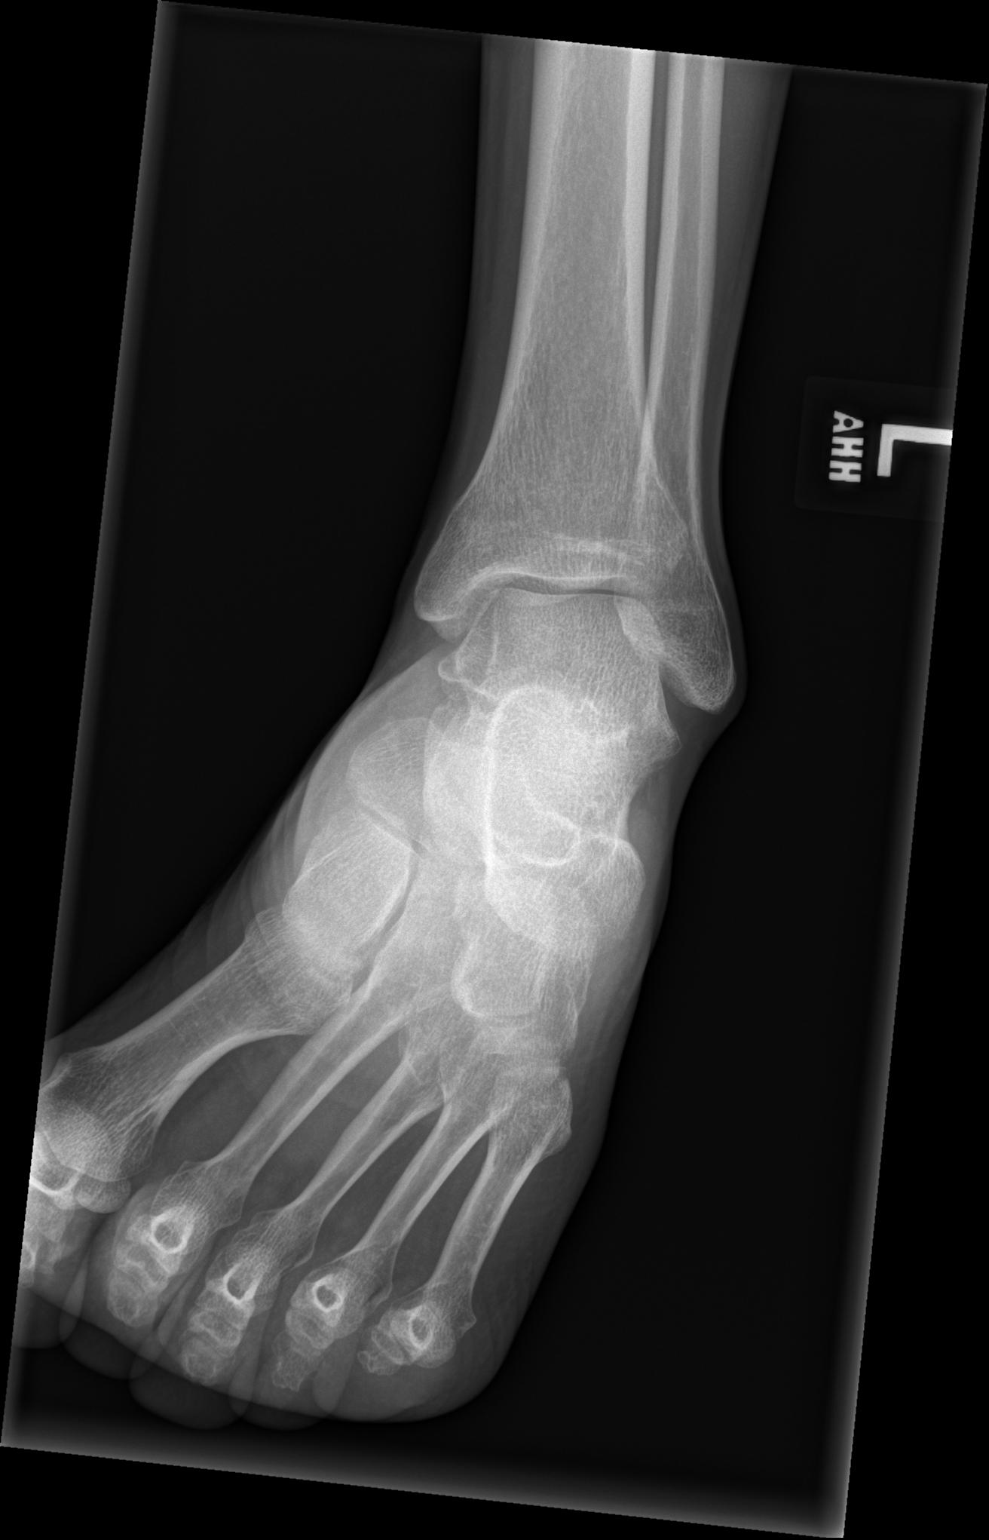

[x ankle obl left]
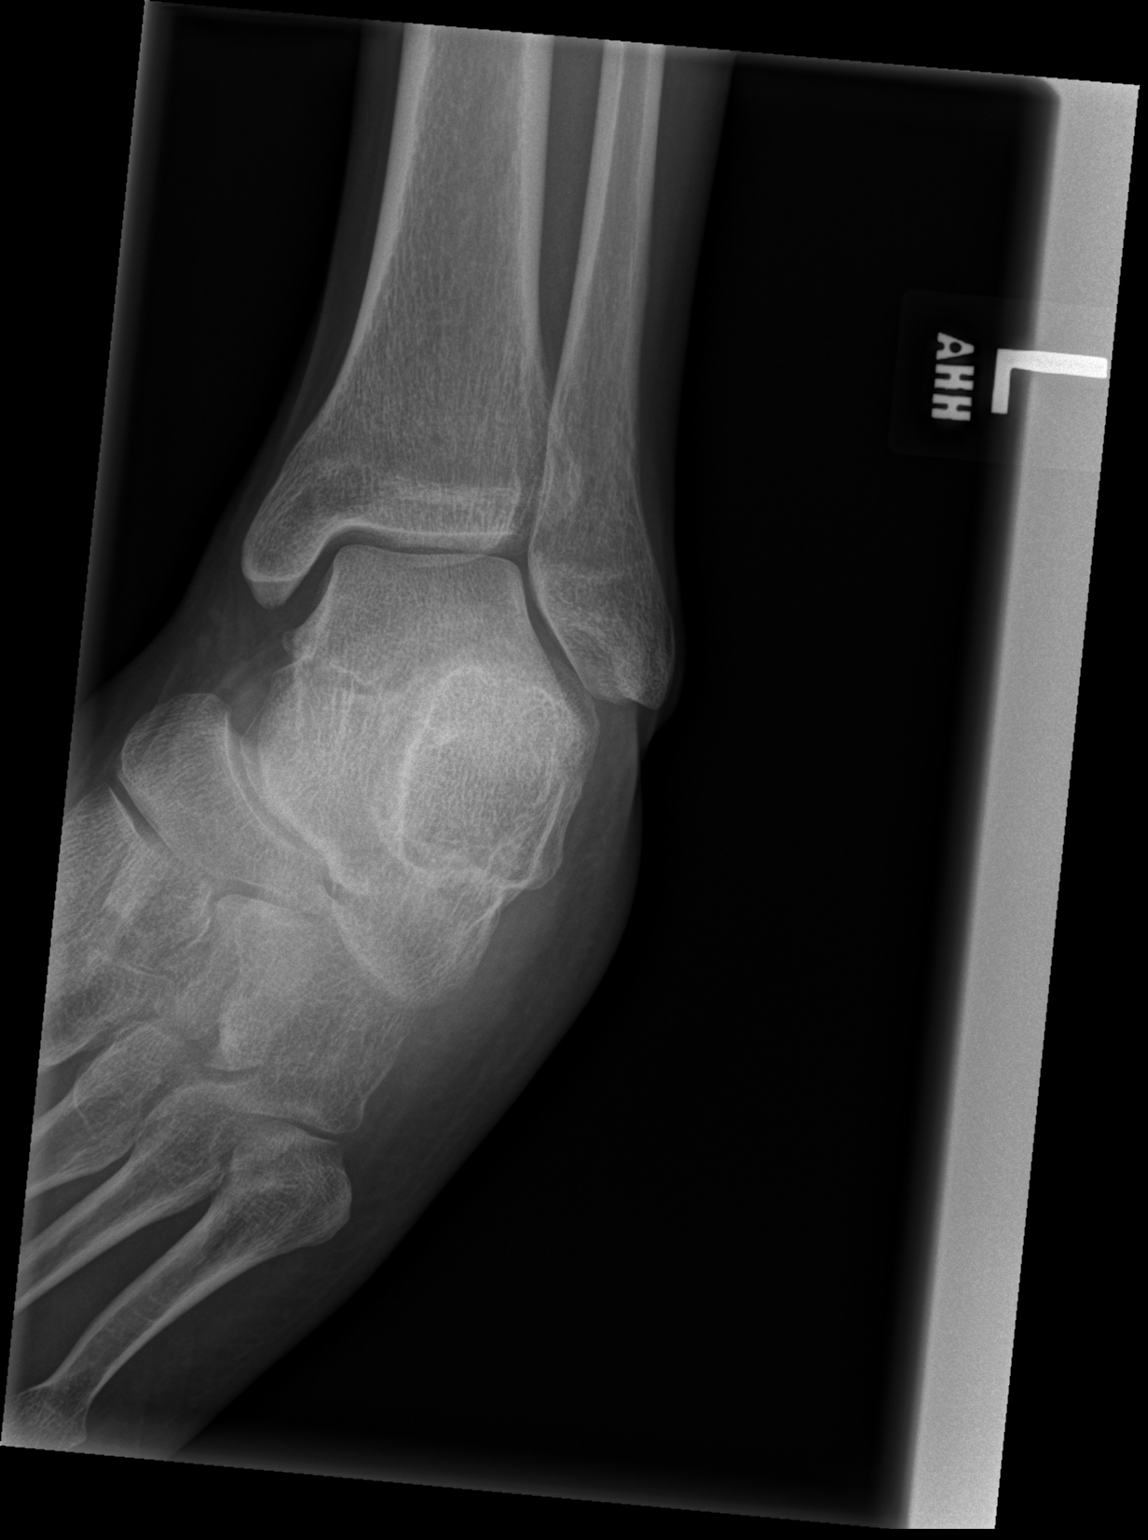

[x ankle lat left]
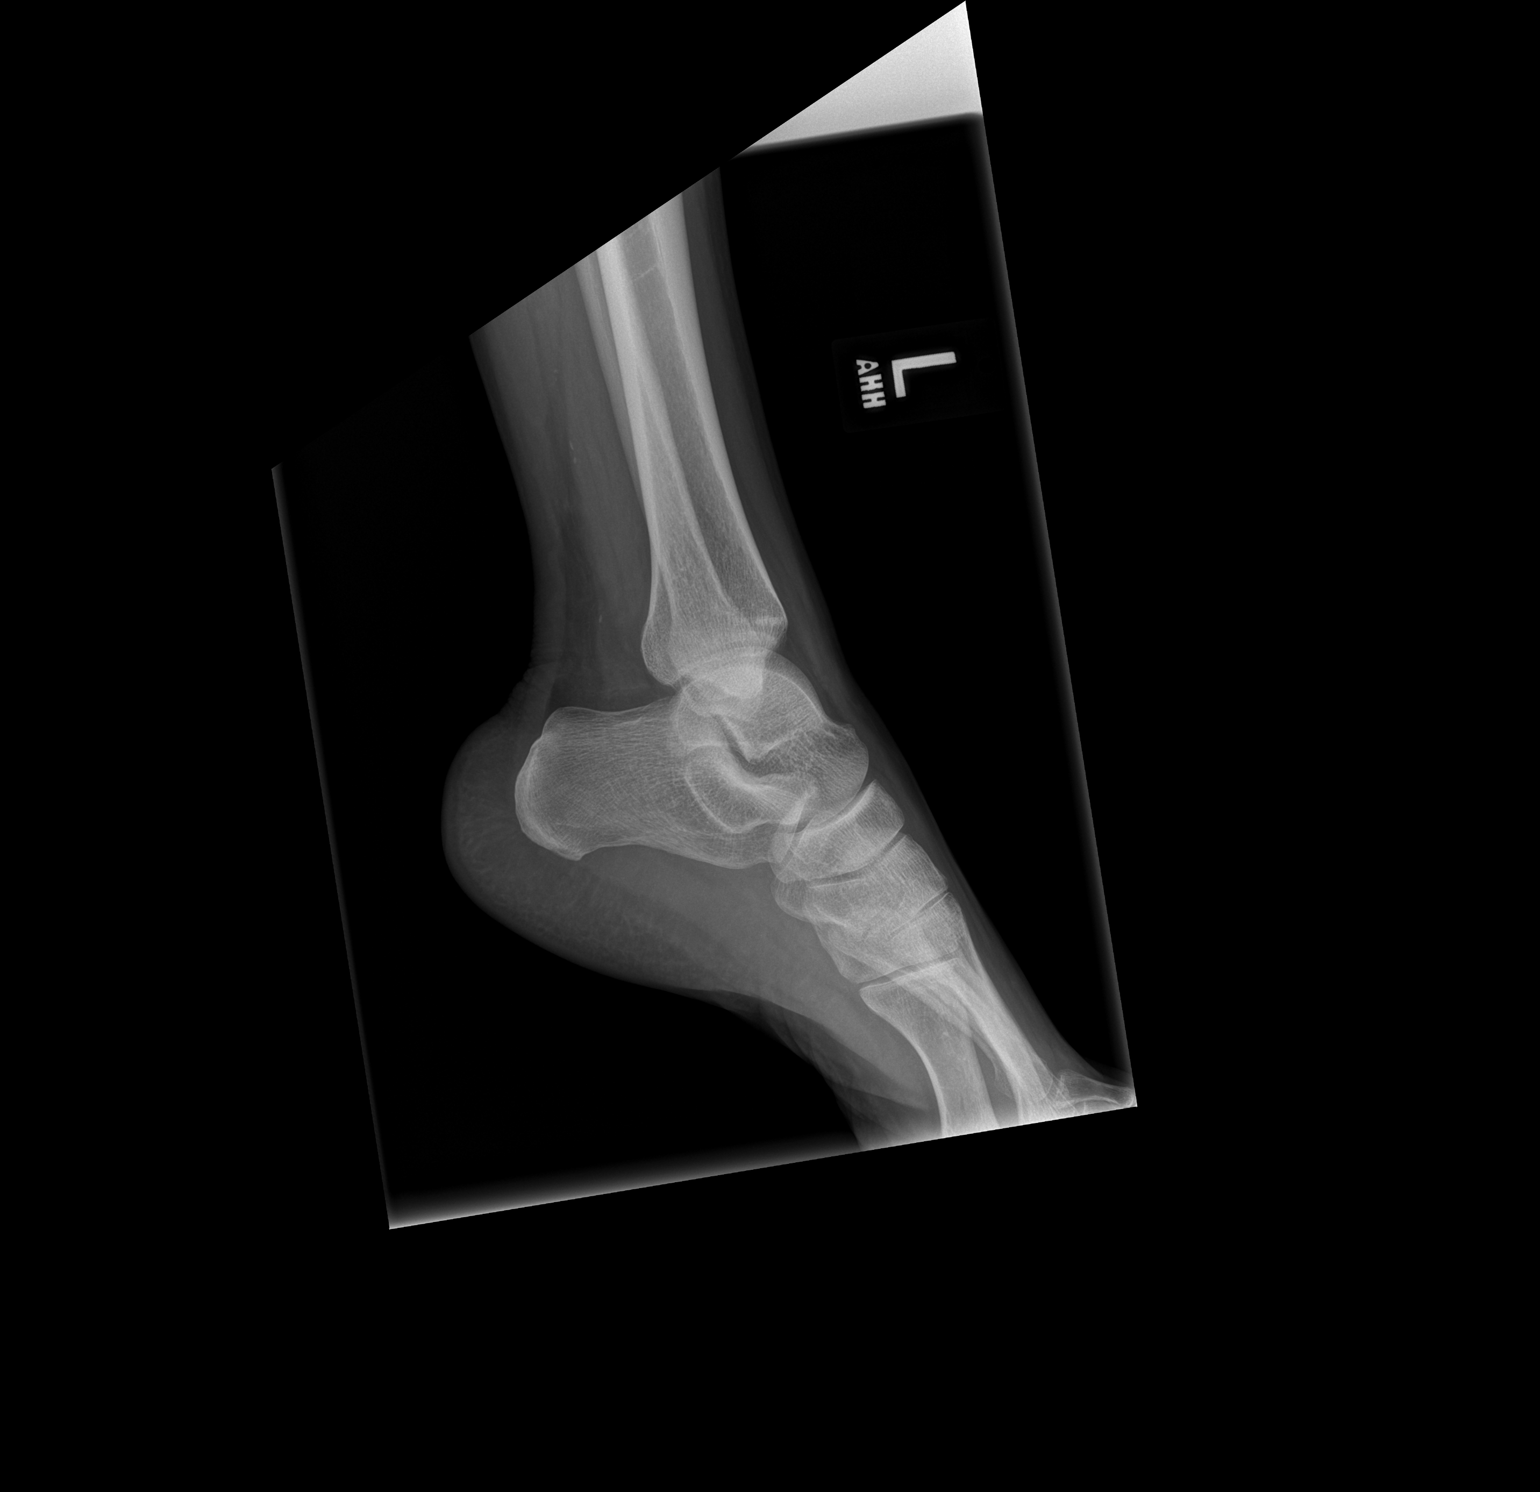

[3 of 3 positions shown; findings below may reference images not displayed]

FINDINGS: There is no evidence of fracture, dislocation, or joint effusion.
There is no evidence of arthropathy or other focal bone abnormality.
Soft tissues are unremarkable.
IMPRESSION: No acute abnormality noted.

## 2022-03-08 ENCOUNTER — Emergency Department (HOSPITAL_COMMUNITY)
Admission: EM | Admit: 2022-03-08 | Discharge: 2022-03-08 | Disposition: A | Discharge status: Home / Self Care | Payer: Self-pay | Attending: Emergency Medicine | Admitting: Emergency Medicine

## 2022-03-08 ENCOUNTER — Other Ambulatory Visit: Payer: Self-pay

## 2022-03-08 ENCOUNTER — Emergency Department (HOSPITAL_COMMUNITY): Payer: Self-pay

## 2022-03-08 DIAGNOSIS — Z720 Tobacco use: Secondary | ICD-10-CM | POA: Insufficient documentation

## 2022-03-08 DIAGNOSIS — I10 Essential (primary) hypertension: Secondary | ICD-10-CM | POA: Insufficient documentation

## 2022-03-08 DIAGNOSIS — Z79899 Other long term (current) drug therapy: Secondary | ICD-10-CM | POA: Insufficient documentation

## 2022-03-08 DIAGNOSIS — R7309 Other abnormal glucose: Secondary | ICD-10-CM | POA: Insufficient documentation

## 2022-03-08 DIAGNOSIS — I739 Peripheral vascular disease, unspecified: Secondary | ICD-10-CM

## 2022-03-08 DIAGNOSIS — L97329 Non-pressure chronic ulcer of left ankle with unspecified severity: Secondary | ICD-10-CM | POA: Insufficient documentation

## 2022-03-08 DIAGNOSIS — B351 Tinea unguium: Secondary | ICD-10-CM | POA: Insufficient documentation

## 2022-03-08 LAB — BASIC METABOLIC PANEL
Anion gap: 9 (ref 5–15)
BUN: 16 mg/dL (ref 6–20)
CO2: 25 mmol/L (ref 22–32)
Calcium: 9.4 mg/dL (ref 8.9–10.3)
Chloride: 104 mmol/L (ref 98–111)
Creatinine, Ser: 1.25 mg/dL — ABNORMAL HIGH (ref 0.61–1.24)
GFR, Estimated: >60 mL/min (ref >60)
Glucose, Bld: 92 mg/dL (ref 70–99)
Potassium: 4.4 mmol/L (ref 3.5–5.1)
Sodium: 138 mmol/L (ref 135–145)

## 2022-03-08 LAB — CBC WITH DIFFERENTIAL/PLATELET
Abs Immature Granulocytes: 0.02 10*3/uL (ref 0.00–0.07)
Basophils Absolute: 0 10*3/uL (ref 0.0–0.1)
Basophils Relative: 0 %
Eosinophils Absolute: 0.1 10*3/uL (ref 0.0–0.5)
Eosinophils Relative: 1 %
HCT: 45.4 % (ref 39.0–52.0)
Hemoglobin: 14.4 g/dL (ref 13.0–17.0)
Immature Granulocytes: 0 %
Lymphocytes Relative: 45 %
Lymphs Abs: 3.6 10*3/uL (ref 0.7–4.0)
MCH: 28.1 pg (ref 26.0–34.0)
MCHC: 31.7 g/dL (ref 30.0–36.0)
MCV: 88.5 fL (ref 80.0–100.0)
Monocytes Absolute: 0.6 10*3/uL (ref 0.1–1.0)
Monocytes Relative: 7 %
Neutro Abs: 3.7 10*3/uL (ref 1.7–7.7)
Neutrophils Relative %: 47 %
Platelets: 303 10*3/uL (ref 150–400)
RBC: 5.13 MIL/uL (ref 4.22–5.81)
RDW: 14.9 % (ref 11.5–15.5)
WBC: 7.9 10*3/uL (ref 4.0–10.5)
nRBC: 0 % (ref 0.0–0.2)

## 2022-03-08 LAB — CBG MONITORING, ED: Glucose-Capillary: 110 mg/dL — ABNORMAL HIGH (ref 70–99)

## 2022-03-08 MED ORDER — IOHEXOL 350 MG/ML SOLN
115.0000 mL | Freq: Once | INTRAVENOUS | Status: AC | PRN
  Administered 2022-03-08: 115 mL via INTRAVENOUS

## 2022-03-08 MED ORDER — ACETAMINOPHEN 325 MG PO TABS
650.0000 mg | ORAL_TABLET | Freq: Once | ORAL | Status: AC
  Administered 2022-03-08: 650 mg via ORAL
  Filled 2022-03-08: qty 2

## 2022-03-08 NOTE — ED Notes (Signed)
Pt discharged home. Discharge instructions provided and pt verbalized understanding. Pt refused vital signs at discharge stating "I'm ready to go home./ Able to wheel himself to ED lobby with no distress noted.

## 2022-03-08 NOTE — ED Provider Notes (Signed)
[facility] EMERGENCY DEPARTMENT Provider Note   CSN: [id number] Arrival date & time: 03/08/22  1338     History  Chief Complaint  Patient presents with   Leg Pain    Jeffrey Fitzgerald is a 51 y.o. male presenting with 2 years of left foot pain.  Says it has become intolerable.  Has started to walk with a walker because he is having difficulty getting up in the morning.  Has not tried any NSAIDs but says he occasionally takes Tylenol.  Has never been seen by PCP.  Denies any medical problems.    Leg Pain      Home Medications Prior to Admission medications   Medication Sig Start Date End Date Taking? Authorizing Provider  amLODipine (NORVASC) 5 MG tablet Take 1 tablet (5 mg total) by mouth daily. 01/25/20 04/24/20  Lorenso Courier, MD  cyclobenzaprine (FLEXERIL) 10 MG tablet Take 1 tablet (10 mg total) by mouth at bedtime. 01/12/20   Walisiewicz, Yvonna Alanis E, PA-C  diclofenac Sodium (VOLTAREN) 1 % GEL Apply 4 g topically 4 (four) times daily. 01/25/20   Chundi, Sherlyn Lees, MD  hydrochlorothiazide (HYDRODIURIL) 25 MG tablet Take 1 tablet (25 mg total) by mouth daily. 01/25/20 04/24/20  Lorenso Courier, MD  meloxicam (MOBIC) 15 MG tablet Take 1 tablet (15 mg total) by mouth daily. 12/10/19   Mardella Layman, MD      Allergies    Patient has no known allergies.    Review of Systems   Review of Systems  Physical Exam Updated Vital Signs BP (!) 176/97 (BP Location: Left Arm)   Pulse 96   Temp 98.8 F (37.1 C) (Oral)   Resp 20   SpO2 97%  Physical Exam Vitals and nursing note reviewed.  Constitutional:      Appearance: Normal appearance.  HENT:     Head: Normocephalic and atraumatic.  Eyes:     General: No scleral icterus.    Conjunctiva/sclera: Conjunctivae normal.  Cardiovascular:     Comments: Very faint palpable radial pulses bilaterally.  No palpable or dopplerable DP/PT pulses. Pulmonary:     Effort: Pulmonary effort is normal. No respiratory distress.   Musculoskeletal:     Comments: Range of motion of all joints of the bilateral lower extremities.  Sensation intact.  Falling off left fourth toenail as well as onychomycosis in all toes.  Ulcerated wound noted to the left lateral ankle  Skin:    General: Skin is dry.     Findings: No rash.  Neurological:     Mental Status: He is alert.  Psychiatric:        Mood and Affect: Mood normal.     ED Results / Procedures / Treatments   Labs (all labs ordered are listed, but only abnormal results are displayed) Labs Reviewed  CBG MONITORING, ED - Abnormal; Notable for the following components:      Result Value   Glucose-Capillary 110 (*)    All other components within normal limits  CBC WITH DIFFERENTIAL/PLATELET  BASIC METABOLIC PANEL    EKG None  Radiology No results found.  Procedures Procedures   Medications Ordered in ED Medications  acetaminophen (TYLENOL) tablet 650 mg (has no administration in time range)    ED Course/ Medical Decision Making/ A&P                           Medical Decision Making Amount and/or Complexity of Data Reviewed Labs: ordered. Radiology:  ordered.  Risk OTC drugs.   This patient presents to the ED for concern of left leg pain.  Differential includes but is not limited to DVT, cellulitis, venous insufficiency, PAD, claudication,   This is not an exhaustive differential.    Past Medical History / Co-morbidities / Social History: Hypertension, tobacco use   Additional history: Per chart review, patient was diagnosed with hypertension via the emergency department in May 2021.  The next week he followed up with internal medicine who referred him to vascular surgery due to severe PAD. This visit did not occur.  Internal medicine physician said he had a very faint palpable DP pulse at that time.  ABIs with IM showed bilateral peripheral artery disease.   Physical Exam: Pertinent physical exam findings include no palpable or dopplerable  distal lower extremity pulses.  Very faint palpable radial pulses bilaterally.  Lab Tests: CBC and chemistry in process.  Fingerstick glucose 110.   Imaging Studies: I ordered vascular runoff.  Pending at this time.    Medications: I ordered medication including Tylenol for patient's current discomfort.  Disposition: Imaging and labs pending at this time.  Patient signed out to Dr. Jeraldine Loots and Dr. Geraldo Pitter.  Please see their notes for results and ultimate disposition.  I suspect patient will need a vascular surgery consult prior to discharge or potentially during admission depending on the findings and lab results.    Woodroe Chen 03/08/22 1903    Gerhard Munch, MD 03/08/22 2156

## 2022-03-08 NOTE — Discharge Instructions (Signed)
As discussed, our vascular surgery colleagues will contact you in the next day or 2 for follow-up.  It is extremely important to keep that appointment as, you and I discussed, your peripheral arterial disease is extensive and requires prompt management.  In the interim, monitor your condition carefully and do not hesitate to return here if you develop new, or concerning changes in your condition.

## 2022-03-08 NOTE — ED Triage Notes (Signed)
Pt reports left leg pain x 2 years.  HX: HTN

## 2022-03-09 ENCOUNTER — Emergency Department (HOSPITAL_COMMUNITY)
Admission: EM | Admit: 2022-03-09 | Discharge: 2022-03-10 | Disposition: A | Discharge status: Home / Self Care | Payer: Self-pay | Attending: Emergency Medicine | Admitting: Emergency Medicine

## 2022-03-09 ENCOUNTER — Encounter (HOSPITAL_COMMUNITY): Payer: Self-pay

## 2022-03-09 ENCOUNTER — Emergency Department (HOSPITAL_COMMUNITY): Payer: Self-pay

## 2022-03-09 ENCOUNTER — Other Ambulatory Visit: Payer: Self-pay

## 2022-03-09 DIAGNOSIS — I10 Essential (primary) hypertension: Secondary | ICD-10-CM | POA: Insufficient documentation

## 2022-03-09 DIAGNOSIS — I739 Peripheral vascular disease, unspecified: Secondary | ICD-10-CM | POA: Insufficient documentation

## 2022-03-09 NOTE — ED Provider Triage Note (Signed)
Emergency Medicine Provider Triage Evaluation Note  Jeffrey Fitzgerald , a 51 y.o. male  was evaluated in triage.  Pt complains of ischemic leg. Pt report progressive worsening pain to L left leg.  Was seen at [facility] yesterday for his leg discomfort and a non healing wound to L ankle at the urging of her aunt.  Was noted to have lack of palpable or dopplerable pulses bilaterally. Had CT angio of the legs and was recommended intervention by vascular surgeon Dr. Chestine Spore, however pt prefers to manage outpt.  He is here again today at the urging of his aunt to have intervention.  He is amenable to staying for further management.  Denies fever, chest pain, sob.  Noted to have elevated BP of 211/133  Review of Systems  Positive: As above Negative: As above  Physical Exam  BP (!) 211/133 (BP Location: Right Arm)   Pulse (!) 104   Temp 98.1 F (36.7 C) (Oral)   Resp 18   Ht 5\' 5"  (1.651 m)   Wt 65.8 kg   SpO2 99%   BMI 24.13 kg/m  Gen:   Awake, no distress   Resp:  Normal effort  MSK:   Moves extremities without difficulty  Other:  Sitting on an electric scooter.    Medical Decision Making  Medically screening exam initiated at 11:10 PM.  Appropriate orders placed.  Jeffrey Fitzgerald was informed that the remainder of the evaluation will be completed by another provider, this initial triage assessment does not replace that evaluation, and the importance of remaining in the ED until their evaluation is complete.     Jeffrey Pitt, PA-C 03/09/22 2313

## 2022-03-09 NOTE — ED Triage Notes (Signed)
Patient seen yesterday, for the same chronic left foot pain that he has had for 2 years. Able to feel his foot. Uses a scooter to get around.

## 2022-03-10 LAB — CBC WITH DIFFERENTIAL/PLATELET
Abs Immature Granulocytes: 0.01 10*3/uL (ref 0.00–0.07)
Basophils Absolute: 0 10*3/uL (ref 0.0–0.1)
Basophils Relative: 0 %
Eosinophils Absolute: 0.1 10*3/uL (ref 0.0–0.5)
Eosinophils Relative: 1 %
HCT: 45.3 % (ref 39.0–52.0)
Hemoglobin: 14.3 g/dL (ref 13.0–17.0)
Immature Granulocytes: 0 %
Lymphocytes Relative: 52 %
Lymphs Abs: 4.3 10*3/uL — ABNORMAL HIGH (ref 0.7–4.0)
MCH: 28.1 pg (ref 26.0–34.0)
MCHC: 31.6 g/dL (ref 30.0–36.0)
MCV: 89.2 fL (ref 80.0–100.0)
Monocytes Absolute: 0.5 10*3/uL (ref 0.1–1.0)
Monocytes Relative: 7 %
Neutro Abs: 3.3 10*3/uL (ref 1.7–7.7)
Neutrophils Relative %: 40 %
Platelets: 268 10*3/uL (ref 150–400)
RBC: 5.08 MIL/uL (ref 4.22–5.81)
RDW: 15.1 % (ref 11.5–15.5)
WBC: 8.2 10*3/uL (ref 4.0–10.5)
nRBC: 0 % (ref 0.0–0.2)

## 2022-03-10 LAB — BASIC METABOLIC PANEL
Anion gap: 8 (ref 5–15)
BUN: 12 mg/dL (ref 6–20)
CO2: 27 mmol/L (ref 22–32)
Calcium: 9.7 mg/dL (ref 8.9–10.3)
Chloride: 106 mmol/L (ref 98–111)
Creatinine, Ser: 1.22 mg/dL (ref 0.61–1.24)
GFR, Estimated: >60 mL/min (ref >60)
Glucose, Bld: 109 mg/dL — ABNORMAL HIGH (ref 70–99)
Potassium: 4.2 mmol/L (ref 3.5–5.1)
Sodium: 141 mmol/L (ref 135–145)

## 2022-03-10 LAB — PROTIME-INR
INR: 0.9 (ref 0.8–1.2)
Prothrombin Time: 12.5 seconds (ref 11.4–15.2)

## 2022-03-10 MED ORDER — HYDRALAZINE HCL 20 MG/ML IJ SOLN
5.0000 mg | Freq: Once | INTRAMUSCULAR | Status: AC
  Administered 2022-03-10: 5 mg via INTRAVENOUS
  Filled 2022-03-10: qty 1

## 2022-03-10 NOTE — ED Notes (Signed)
Doppler to left foot. Unable to palpate or auscultate pedal pulses on doppler. Pitting edema to left foot with odor.

## 2022-03-10 NOTE — Discharge Instructions (Addendum)
You were seen today for concerns for known peripheral vascular disease.  You should receive a call later today to schedule your angiography.  You were notably hypertensive.  Make sure to take your blood pressure medications as prescribed.

## 2022-03-10 NOTE — ED Provider Notes (Addendum)
[facility]-EMERGENCY DEPT Provider Note   CSN: [id number] Arrival date & time: 03/09/22  2251     History  Chief Complaint  Patient presents with   Foot Pain    Osmel Granberg is a 51 y.o. male.  HPI     This is a 51 year old male with a history of claudication and known peripheral vascular disease who presents "to see vascular surgery."  Patient was seen and evaluated yesterday.  At that time he was having increasing left leg pain.  CT angio was obtained secondary to nondopplerable pulses and a nonhealing wound on the left ankle.  CT angio was concerning for occlusion with distal revascularization.  Intervention was recommended by vascular surgery although the patient declined admission.  He was to follow-up as an outpatient.  Patient states that since being discharged, he had a family member who was a nurse and told him that he could die if he did not have this fixed.  This "changed his mind."  He states that his leg pain is unchanged.  Home Medications Prior to Admission medications   Medication Sig Start Date End Date Taking? Authorizing Provider  amLODipine (NORVASC) 5 MG tablet Take 1 tablet (5 mg total) by mouth daily. 01/25/20 04/24/20  Lorenso Courier, MD  cyclobenzaprine (FLEXERIL) 10 MG tablet Take 1 tablet (10 mg total) by mouth at bedtime. 01/12/20   Walisiewicz, Yvonna Alanis E, PA-C  diclofenac Sodium (VOLTAREN) 1 % GEL Apply 4 g topically 4 (four) times daily. 01/25/20   Chundi, Sherlyn Lees, MD  hydrochlorothiazide (HYDRODIURIL) 25 MG tablet Take 1 tablet (25 mg total) by mouth daily. 01/25/20 04/24/20  Lorenso Courier, MD  meloxicam (MOBIC) 15 MG tablet Take 1 tablet (15 mg total) by mouth daily. 12/10/19   Mardella Layman, MD      Allergies    Patient has no known allergies.    Review of Systems   Review of Systems  Constitutional:  Negative for fever.  Skin:  Positive for wound.  All other systems reviewed and are negative.   Physical Exam Updated Vital  Signs BP (!) 190/101   Pulse 82   Temp 98.1 F (36.7 C) (Oral)   Resp 20   Ht 1.651 m (5\' 5" )   Wt 65.8 kg   SpO2 99%   BMI 24.13 kg/m  Physical Exam Vitals and nursing note reviewed.  Constitutional:      Appearance: He is well-developed. He is not ill-appearing.  HENT:     Head: Normocephalic and atraumatic.  Eyes:     Pupils: Pupils are equal, round, and reactive to light.  Cardiovascular:     Rate and Rhythm: Normal rate and regular rhythm.  Pulmonary:     Effort: Pulmonary effort is normal. No respiratory distress.  Abdominal:     Palpations: Abdomen is soft.     Tenderness: There is no abdominal tenderness. There is no rebound.  Musculoskeletal:     Cervical back: Neck supple.     Comments: Slightly swollen left foot with ulcer noted on the lateral malleolus, unable to palpate DP or PT pulses, foot is tepid to touch  Lymphadenopathy:     Cervical: No cervical adenopathy.  Skin:    General: Skin is warm and dry.  Neurological:     Mental Status: He is alert and oriented to person, place, and time.     ED Results / Procedures / Treatments   Labs (all labs ordered are listed, but only abnormal results are displayed) Labs  Reviewed  BASIC METABOLIC PANEL - Abnormal; Notable for the following components:      Result Value   Glucose, Bld 109 (*)    All other components within normal limits  CBC WITH DIFFERENTIAL/PLATELET - Abnormal; Notable for the following components:   Lymphs Abs 4.3 (*)    All other components within normal limits  PROTIME-INR    EKG EKG Interpretation  Date/Time:  Wednesday March 10 2022 01:08:51 EDT Ventricular Rate:  101 PR Interval:  180 QRS Duration: 95 QT Interval:  344 QTC Calculation: 446 R Axis:   45 Text Interpretation: Sinus tachycardia Probable left atrial enlargement Left ventricular hypertrophy ST elev, probable normal early repol pattern Baseline wander in lead(s) V1 Confirmed by Ross Marcus ([id number]) on 03/10/2022  3:10:01 AM  Radiology DG Chest 2 View  Result Date: 03/09/2022 CLINICAL DATA:  Preoperative. EXAM: CHEST - 2 VIEW COMPARISON:  CT angiogram abdominal aorta. FINDINGS: The heart size and mediastinal contours are within normal limits. Both lungs are clear. The visualized skeletal structures are unremarkable. IMPRESSION: No active cardiopulmonary disease. Electronically Signed   By: Darliss Cheney M.D.   On: 03/09/2022 23:43   CT Angio Aortobifemoral W and/or Wo Contrast  Result Date: 03/08/2022 CLINICAL DATA:  Left foot pain. Pain for 2 years, progressive over the last month. EXAM: CT ANGIOGRAPHY OF ABDOMINAL AORTA WITH ILIOFEMORAL RUNOFF TECHNIQUE: Multidetector CT imaging of the abdomen, pelvis and lower extremities was performed using the standard protocol during bolus administration of intravenous contrast. Multiplanar CT image reconstructions and MIPs were obtained to evaluate the vascular anatomy. RADIATION DOSE REDUCTION: This exam was performed according to the departmental dose-optimization program which includes automated exposure control, adjustment of the mA and/or kV according to patient size and/or use of iterative reconstruction technique. CONTRAST:  OMNIPAQUE IOHEXOL 350 MG/ML SOLN COMPARISON:  None Available. FINDINGS: VASCULAR Aorta: Moderate calcified and irregular noncalcified atheromatous plaque. Noncalcified plaque causes 40% stenosis of the distal abdominal aorta. No aneurysm, dissection, or acute aortic findings. No vasculitis or perivascular haziness. Celiac: Patent without evidence of aneurysm, dissection, vasculitis or significant stenosis. SMA: Patent without evidence of aneurysm, dissection, vasculitis or significant stenosis. Renals: Single right and 2 left renal arteries. All renal arteries are patent. The inferior left renal arteries accessory. No stenosis, dissection, vasculitis or fibromuscular dysplasia. IMA: Patent coursing through atheromatous plaque. RIGHT Lower  Extremity Inflow: Moderately calcified with approximately 50% stenosis of the external iliac just beyond the internal external bifurcation. The internal iliac artery is diffusely small in caliber, intermittently occluded. There is distal reconstitution as it exits pelvis Outflow: Common iliac artery is moderately calcified with less than 50% stenosis. The superficial femoral artery is diffusely diseased. Complete occlusion of the superficial femoral artery in the midportion with distal reconstitution at the popliteal, occluded segment spans greater than 20 cm. The popliteal artery is diffusely diseased and small in caliber. Runoff: The calf vessels are diffusely diseased and small in caliber, calcifications limits runoff assessment. There is likely single-vessel runoff to the ankle. LEFT Lower Extremity Inflow: Common iliac artery is occluded at the bifurcation. The external iliac artery is completely occluded. There is distal reconstitution at the inguinal crease. Outflow: Common femoral artery is mildly diseased with less than 50% stenosis. The superficial femoral arteries intermittently occluded, including short segment occlusion at the origin. More moderate length segment of occlusion spans nearly 13 cm. There is some reconstitution of the superficial femoral artery in the mid thigh. The more distal superficial femoral artery is  diffusely diseased with areas of high-grade stenosis and near occlusion. The popliteal artery is small in caliber, high-grade stenosis of the distal popliteal artery. Runoff: Calf vessels are diffusely diseased. There is only intermittent flow. There is reconstituted 2 vessel runoff to the ankle. Veins: Not well assessed on this arterial phase exam. Presumed contrast mixing within the portal and splenic veins. Review of the MIP images confirms the above findings. NON-VASCULAR Lower chest: No basilar pulmonary embolus. Subsegmental lingular atelectasis. No pleural effusion. Hepatobiliary:  No focal hepatic abnormality on this arterial phase exam. Gallbladder physiologically distended, no calcified stone. No biliary dilatation. Pancreas: Unremarkable. No pancreatic ductal dilatation or surrounding inflammatory changes. Spleen: Normal in size. Arterial enhancing focus in the anterior spleen is nonspecific, possible hemangioma. Adrenals/Urinary Tract: Normal adrenal glands. No hydronephrosis. No evidence of focal renal abnormality on this arterial phase exam. Partially distended urinary bladder. Stomach/Bowel: Stomach is within normal limits. Appendix appears normal. No evidence of bowel wall thickening, distention, or inflammatory changes. Lymphatic: No bulky adenopathy. Reproductive: Prostate is unremarkable. Other: No ascites. No free air. Minimal fat containing right inguinal hernia Musculoskeletal: There are no acute or suspicious osseous abnormalities. IMPRESSION: VASCULAR Aorta: 1. Advanced vascular disease, particularly for age. 2. Mixed calcified and irregular noncalcified atheromatous plaque in the abdominal aorta. Noncalcified plaque causes 40% luminal narrowing of the distal aorta. Right lower extremity: 1. Diffusely diseased common and external iliac artery. Superficial femoral artery occlusion spans more than 20 cm. 2. Calcified and small caliber calf vessels, poorly opacified. There is single-vessel runoff to the ankle. Left lower extremity: 1. Long segment common iliac artery occlusion extending into the internal and external iliac arteries. Distal reconstitution at the distal external iliac. 2. Diffusely diseased superficial femoral artery, intermittently occluded including a 13 cm length occlusion. Distal superficial femoral artery is diffusely diseased and small in caliber with varying degrees of stenosis. 3. Calf vessels are diffusely calcified and small in caliber. There is 2 vessel runoff to the foot. NON-VASCULAR 1. No acute nonvascular abnormalities. 2. Small fat containing right  inguinal hernia. Electronically Signed   By: Keith Rake M.D.   On: 03/08/2022 21:03    Procedures Procedures    Medications Ordered in ED Medications  hydrALAZINE (APRESOLINE) injection 5 mg (5 mg Intravenous Given 03/10/22 0349)    ED Course/ Medical Decision Making/ A&P Clinical Course as of 03/10/22 0555  Wed Mar 10, 2022  0553 Spoke with Dr. Donzetta Matters, vascular surgery.  Patient's exam is stable from yesterday based on chart review.  I reviewed CTA imaging with Dr. Donzetta Matters who recommends that he have his angiogram as an outpatient as previously planned.  He will contact him later today for scheduling.  I advised the patient that this is very important but can be done as an outpatient especially given stable exam and chronicity of findings. [CH]    Clinical Course User Index [CH] Jarryn Altland, Barbette Hair, MD                           Medical Decision Making Risk Prescription drug management.   This patient presents to the ED for concern of left leg pain, this involves an extensive number of treatment options, and is a complaint that carries with it a high risk of complications and morbidity.  I considered the following differential and admission for this acute, potentially life threatening condition.  The differential diagnosis includes peripheral vascular disease, acute occlusion  MDM:  This is a 51 year old male with known peripheral vascular disease.  Was offered admission yesterday for arterial obstruction but declined.  Returns after family member convinced him.  Symptoms are unchanged.  Exam based on chart review is unchanged as well.  I am unable to Doppler pulses as well.  Labs are largely reassuring.  He is hypertensive with a blood pressure 199/107.  Do not feel I need to wake up vascular surgery at this early hour given that the exam is unchanged.  We will consult with them later this morning regarding with the patient should be admitted versus continuing with outpatient  follow-up.  (Labs, imaging, consults)  Labs: I Ordered, and personally interpreted labs.  The pertinent results include: CBC, BMP  Imaging Studies ordered: I ordered imaging studies including reviewed CTA from prior evaluation I independently visualized and interpreted imaging. I agree with the radiologist interpretation  Additional history obtained from chart review.  External records from outside source obtained and reviewed including CTA  Cardiac Monitoring: The patient was maintained on a cardiac monitor.  I personally viewed and interpreted the cardiac monitored which showed an underlying rhythm of: Normal sinus rhythm  Reevaluation: After the interventions noted above, I reevaluated the patient and found that they have :stayed the same  Social Determinants of Health: Lives independently  Disposition: Pending  Co morbidities that complicate the patient evaluation  Past Medical History:  Diagnosis Date   Hypertension      Medicines Meds ordered this encounter  Medications   hydrALAZINE (APRESOLINE) injection 5 mg    I have reviewed the patients home medicines and have made adjustments as needed  Problem List / ED Course: Problem List Items Addressed This Visit       Cardiovascular and Mediastinum   Hypertension   Other Visit Diagnoses     Claudication [facility])    -  Primary                   Final Clinical Impression(s) / ED Diagnoses Final diagnoses:  Claudication [facility])  Primary hypertension    Rx / DC Orders ED Discharge Orders     None         Eknoor Novack, Barbette Hair, MD 03/10/22 0344    Merryl Hacker, MD 03/10/22 [phone]

## 2022-03-23 ENCOUNTER — Other Ambulatory Visit: Payer: Self-pay | Admitting: *Deleted

## 2022-03-23 DIAGNOSIS — M79672 Pain in left foot: Secondary | ICD-10-CM

## 2022-03-30 ENCOUNTER — Other Ambulatory Visit: Payer: Self-pay | Admitting: *Deleted

## 2022-03-30 ENCOUNTER — Encounter: Payer: Self-pay | Admitting: *Deleted

## 2022-03-30 ENCOUNTER — Encounter: Payer: Self-pay | Admitting: Vascular Surgery

## 2022-03-30 ENCOUNTER — Ambulatory Visit (INDEPENDENT_AMBULATORY_CARE_PROVIDER_SITE_OTHER): Payer: Medicaid Other | Admitting: Vascular Surgery

## 2022-03-30 ENCOUNTER — Ambulatory Visit (HOSPITAL_COMMUNITY)
Admission: RE | Admit: 2022-03-30 | Discharge: 2022-03-30 | Disposition: A | Discharge status: Home / Self Care | Payer: Medicaid Other | Source: Ambulatory Visit | Attending: Vascular Surgery | Admitting: Vascular Surgery

## 2022-03-30 DIAGNOSIS — I70243 Atherosclerosis of native arteries of left leg with ulceration of ankle: Secondary | ICD-10-CM | POA: Diagnosis not present

## 2022-03-30 DIAGNOSIS — I745 Embolism and thrombosis of iliac artery: Secondary | ICD-10-CM

## 2022-03-30 DIAGNOSIS — M79672 Pain in left foot: Secondary | ICD-10-CM | POA: Diagnosis present

## 2022-03-30 DIAGNOSIS — I70222 Atherosclerosis of native arteries of extremities with rest pain, left leg: Secondary | ICD-10-CM

## 2022-03-30 NOTE — Progress Notes (Addendum)
Patient name: Jeffrey Fitzgerald MRN: [id number] DOB: 1971/05/26 Sex: male  REASON FOR CONSULT: Critical limb ischemia of left lower extremity with wound  HPI: Jeffrey Fitzgerald is a 51 y.o. male, with history of hypertension that presents for evaluation of critical limb ischemia of the left lower extremity with tissue loss.  Patient states he has had pain in his left foot for the last 2 years.  He cannot sleep at night.  States he really has had difficult time even moving his foot for several years.  He has since developed a wound on the left lateral ankle as well as the left fourth toe.  He states this was about a month ago when he fell out of his scooter.  No previous abdominal surgery.  States he smokes half a pack of cigarettes a day for the last 30 years.  Denies other illicit drug use.  He denies any problems with his right lower extremity  Past Medical History:  Diagnosis Date   Hypertension     History reviewed. No pertinent surgical history.  Family History  Problem Relation Age of Onset   Hypertension Father    Heart disease Father     SOCIAL HISTORY: Social History   Socioeconomic History   Marital status: Single    Spouse name: Not on file   Number of children: Not on file   Years of education: Not on file   Highest education level: Not on file  Occupational History   Not on file  Tobacco Use   Smoking status: Every Day    Packs/day: 0.50    Years: 30.00    Total pack years: 15.00    Types: Cigarettes   Smokeless tobacco: Never  Vaping Use   Vaping Use: Never used  Substance and Sexual Activity   Alcohol use: Yes    Comment: occasionally   Drug use: Yes    Types: Marijuana   Sexual activity: Not on file  Other Topics Concern   Not on file  Social History Narrative   Not on file   Social Determinants of Health   Financial Resource Strain: Not on file  Food Insecurity: Not on file  Transportation Needs: Not on file  Physical Activity: Not on file   Stress: Not on file  Social Connections: Not on file  Intimate Partner Violence: Not on file    No Known Allergies  Current Outpatient Medications  Medication Sig Dispense Refill   cyclobenzaprine (FLEXERIL) 10 MG tablet Take 1 tablet (10 mg total) by mouth at bedtime. 7 tablet 0   diclofenac Sodium (VOLTAREN) 1 % GEL Apply 4 g topically 4 (four) times daily. 350 g 0   meloxicam (MOBIC) 15 MG tablet Take 1 tablet (15 mg total) by mouth daily. 14 tablet 0   amLODipine (NORVASC) 5 MG tablet Take 1 tablet (5 mg total) by mouth daily. 90 tablet 0   hydrochlorothiazide (HYDRODIURIL) 25 MG tablet Take 1 tablet (25 mg total) by mouth daily. 90 tablet 0   No current facility-administered medications for this visit.    REVIEW OF SYSTEMS:  [X]  denotes positive finding, [ ]  denotes negative finding Cardiac  Comments:  Chest pain or chest pressure:    Shortness of breath upon exertion:    Short of breath when lying flat:    Irregular heart rhythm:        Vascular    Pain in calf, thigh, or hip brought on by ambulation:    Pain in feet  at night that wakes you up from your sleep:  x Left   Blood clot in your veins:    Leg swelling:         Pulmonary    Oxygen at home:    Productive cough:     Wheezing:         Neurologic    Sudden weakness in arms or legs:     Sudden numbness in arms or legs:     Sudden onset of difficulty speaking or slurred speech:    Temporary loss of vision in one eye:     Problems with dizziness:         Gastrointestinal    Blood in stool:     Vomited blood:         Genitourinary    Burning when urinating:     Blood in urine:        Psychiatric    Major depression:         Hematologic    Bleeding problems:    Problems with blood clotting too easily:        Skin    Rashes or ulcers:        Constitutional    Fever or chills:      PHYSICAL EXAM: Vitals:   03/30/22 1449  BP: (!) 180/90  Pulse: (!) 111  Resp: 16  Temp: 98.2 F (36.8 C)   TempSrc: Temporal  SpO2: 96%  Weight: 145 lb (65.8 kg)  Height: 5\' 5"  (1.651 m)    GENERAL: The patient is a well-nourished male, in no acute distress. The vital signs are documented above. CARDIAC: There is a regular rate and rhythm.  VASCULAR:  Right femoral pulse palpable Left femoral pulse nonpalpable Left lateral ankle ulcer pictured Left 4th toe with open ulcer and purulent drainage PULMONARY: No respiratory distress. ABDOMEN: Soft and non-tender. MUSCULOSKELETAL: There are no major deformities or cyanosis. NEUROLOGIC: No focal weakness or paresthesias are detected. SKIN: There are no ulcers or rashes noted. PSYCHIATRIC: The patient has a normal affect.     DATA:   ABIs today are 0.33 on the right dampened monophasic and absent on the left  CTA reviewed from 03/08/2022 showing a long segment left common and external iliac occlusion.  He reconstitutes his common femoral with disease in the proximal profunda.  He has significant infrainguinal disease with an occluded left SFA and likely popliteal and tibial disease.  CTA shows two-vessel runoff to the ankle in the PT and AT.  Assessment/Plan:  51 year old male presents with critical limb ischemia with tissue loss of the left lower extremity.  He describes about 2 years of rest pain in the left foot now progressing to tissue loss over the last month with a left ankle wound as well as a left fourth toe wound.  I have ultimately discussed the limb threatening nature of his presentation.  CTA from 03/08/2022 reviewed and shows a long segment left common and external iliac occlusion with significant infrainguinal disease as well with occluded SFA and significant popliteal and tibial disease.  I discussed that I think he needs an aortobifemoral bypass with likely left leg bypass as well.  I will plan to do an angiogram on Thursday in the Cath Lab from a right transfemoral approach to evaluate for distal targets in the left leg.  I will  then admit him after his procedure to get cardiology clearance as I think we need to expedite his care and he also has  some purulence draining from the left 4th toe and I would like to put him on antibiotics prior to bypass.  I discussed I would tentatively schedule his aortobifemoral bypass with left lower extremity bypass next Wednesday, 04/07/2022.  Discussed that this is a high cardiac risk procedure with aortobifemoral bypass and we will need to ensure cardiology feels he can tolerate the operation.  I discussed risk of aortobifemoral bypass including risk of heart attack, stroke, renal failure, wound problems including graft infection, risk of anesthesia, risk of bleeding, prolonged hospital stay, even death.    Cephus Shelling, MD [facility] Office: [phone]

## 2022-04-01 ENCOUNTER — Encounter (HOSPITAL_COMMUNITY)
Admission: RE | Disposition: A | Discharge status: Home Health | Payer: Self-pay | Source: Home / Self Care | Attending: Vascular Surgery

## 2022-04-01 ENCOUNTER — Other Ambulatory Visit: Payer: Self-pay

## 2022-04-01 ENCOUNTER — Inpatient Hospital Stay (HOSPITAL_COMMUNITY)
Admission: RE | Admit: 2022-04-01 | Discharge: 2022-05-19 | DRG: 271 | Disposition: A | Discharge status: Home Health | Payer: Medicaid Other | Attending: Vascular Surgery | Admitting: Vascular Surgery

## 2022-04-01 ENCOUNTER — Encounter (HOSPITAL_COMMUNITY): Payer: Self-pay | Admitting: Vascular Surgery

## 2022-04-01 DIAGNOSIS — D62 Acute posthemorrhagic anemia: Secondary | ICD-10-CM | POA: Diagnosis not present

## 2022-04-01 DIAGNOSIS — I160 Hypertensive urgency: Secondary | ICD-10-CM | POA: Diagnosis present

## 2022-04-01 DIAGNOSIS — E876 Hypokalemia: Secondary | ICD-10-CM | POA: Diagnosis present

## 2022-04-01 DIAGNOSIS — I70222 Atherosclerosis of native arteries of extremities with rest pain, left leg: Secondary | ICD-10-CM | POA: Diagnosis not present

## 2022-04-01 DIAGNOSIS — Z515 Encounter for palliative care: Secondary | ICD-10-CM | POA: Diagnosis not present

## 2022-04-01 DIAGNOSIS — D649 Anemia, unspecified: Secondary | ICD-10-CM | POA: Diagnosis present

## 2022-04-01 DIAGNOSIS — M79A21 Nontraumatic compartment syndrome of right lower extremity: Secondary | ICD-10-CM | POA: Diagnosis not present

## 2022-04-01 DIAGNOSIS — N289 Disorder of kidney and ureter, unspecified: Secondary | ICD-10-CM | POA: Diagnosis not present

## 2022-04-01 DIAGNOSIS — T82868A Thrombosis of vascular prosthetic devices, implants and grafts, initial encounter: Secondary | ICD-10-CM | POA: Diagnosis not present

## 2022-04-01 DIAGNOSIS — N179 Acute kidney failure, unspecified: Secondary | ICD-10-CM | POA: Diagnosis not present

## 2022-04-01 DIAGNOSIS — Z79899 Other long term (current) drug therapy: Secondary | ICD-10-CM | POA: Diagnosis not present

## 2022-04-01 DIAGNOSIS — I9589 Other hypotension: Secondary | ICD-10-CM | POA: Diagnosis not present

## 2022-04-01 DIAGNOSIS — Y838 Other surgical procedures as the cause of abnormal reaction of the patient, or of later complication, without mention of misadventure at the time of the procedure: Secondary | ICD-10-CM | POA: Diagnosis not present

## 2022-04-01 DIAGNOSIS — I70223 Atherosclerosis of native arteries of extremities with rest pain, bilateral legs: Secondary | ICD-10-CM | POA: Diagnosis present

## 2022-04-01 DIAGNOSIS — I7409 Other arterial embolism and thrombosis of abdominal aorta: Secondary | ICD-10-CM | POA: Diagnosis not present

## 2022-04-01 DIAGNOSIS — I743 Embolism and thrombosis of arteries of the lower extremities: Secondary | ICD-10-CM | POA: Diagnosis not present

## 2022-04-01 DIAGNOSIS — K219 Gastro-esophageal reflux disease without esophagitis: Secondary | ICD-10-CM | POA: Diagnosis not present

## 2022-04-01 DIAGNOSIS — L97329 Non-pressure chronic ulcer of left ankle with unspecified severity: Secondary | ICD-10-CM | POA: Diagnosis present

## 2022-04-01 DIAGNOSIS — D6959 Other secondary thrombocytopenia: Secondary | ICD-10-CM | POA: Diagnosis not present

## 2022-04-01 DIAGNOSIS — I70243 Atherosclerosis of native arteries of left leg with ulceration of ankle: Secondary | ICD-10-CM | POA: Diagnosis not present

## 2022-04-01 DIAGNOSIS — R200 Anesthesia of skin: Secondary | ICD-10-CM | POA: Diagnosis not present

## 2022-04-01 DIAGNOSIS — E872 Acidosis, unspecified: Secondary | ICD-10-CM | POA: Diagnosis not present

## 2022-04-01 DIAGNOSIS — I739 Peripheral vascular disease, unspecified: Secondary | ICD-10-CM

## 2022-04-01 DIAGNOSIS — E785 Hyperlipidemia, unspecified: Secondary | ICD-10-CM | POA: Diagnosis present

## 2022-04-01 DIAGNOSIS — T82898A Other specified complication of vascular prosthetic devices, implants and grafts, initial encounter: Secondary | ICD-10-CM | POA: Diagnosis not present

## 2022-04-01 DIAGNOSIS — I70263 Atherosclerosis of native arteries of extremities with gangrene, bilateral legs: Secondary | ICD-10-CM | POA: Diagnosis not present

## 2022-04-01 DIAGNOSIS — F17210 Nicotine dependence, cigarettes, uncomplicated: Secondary | ICD-10-CM | POA: Diagnosis present

## 2022-04-01 DIAGNOSIS — I745 Embolism and thrombosis of iliac artery: Secondary | ICD-10-CM | POA: Diagnosis present

## 2022-04-01 DIAGNOSIS — K567 Ileus, unspecified: Secondary | ICD-10-CM | POA: Diagnosis not present

## 2022-04-01 DIAGNOSIS — R7401 Elevation of levels of liver transaminase levels: Secondary | ICD-10-CM | POA: Diagnosis not present

## 2022-04-01 DIAGNOSIS — L97529 Non-pressure chronic ulcer of other part of left foot with unspecified severity: Secondary | ICD-10-CM | POA: Diagnosis present

## 2022-04-01 DIAGNOSIS — I998 Other disorder of circulatory system: Secondary | ICD-10-CM | POA: Diagnosis not present

## 2022-04-01 DIAGNOSIS — L8990 Pressure ulcer of unspecified site, unspecified stage: Secondary | ICD-10-CM | POA: Diagnosis not present

## 2022-04-01 DIAGNOSIS — D6489 Other specified anemias: Secondary | ICD-10-CM | POA: Diagnosis not present

## 2022-04-01 DIAGNOSIS — Z781 Physical restraint status: Secondary | ICD-10-CM

## 2022-04-01 DIAGNOSIS — I70221 Atherosclerosis of native arteries of extremities with rest pain, right leg: Secondary | ICD-10-CM | POA: Diagnosis not present

## 2022-04-01 DIAGNOSIS — T8131XA Disruption of external operation (surgical) wound, not elsewhere classified, initial encounter: Secondary | ICD-10-CM | POA: Diagnosis not present

## 2022-04-01 DIAGNOSIS — I97618 Postprocedural hemorrhage and hematoma of a circulatory system organ or structure following other circulatory system procedure: Secondary | ICD-10-CM | POA: Diagnosis not present

## 2022-04-01 DIAGNOSIS — I509 Heart failure, unspecified: Secondary | ICD-10-CM | POA: Diagnosis not present

## 2022-04-01 DIAGNOSIS — Z597 Insufficient social insurance and welfare support: Secondary | ICD-10-CM

## 2022-04-01 DIAGNOSIS — I130 Hypertensive heart and chronic kidney disease with heart failure and stage 1 through stage 4 chronic kidney disease, or unspecified chronic kidney disease: Secondary | ICD-10-CM | POA: Diagnosis not present

## 2022-04-01 DIAGNOSIS — Z791 Long term (current) use of non-steroidal anti-inflammatories (NSAID): Secondary | ICD-10-CM

## 2022-04-01 DIAGNOSIS — R6510 Systemic inflammatory response syndrome (SIRS) of non-infectious origin without acute organ dysfunction: Secondary | ICD-10-CM | POA: Diagnosis not present

## 2022-04-01 DIAGNOSIS — I70262 Atherosclerosis of native arteries of extremities with gangrene, left leg: Secondary | ICD-10-CM | POA: Diagnosis not present

## 2022-04-01 DIAGNOSIS — N170 Acute kidney failure with tubular necrosis: Secondary | ICD-10-CM | POA: Diagnosis not present

## 2022-04-01 DIAGNOSIS — D72829 Elevated white blood cell count, unspecified: Secondary | ICD-10-CM | POA: Diagnosis not present

## 2022-04-01 DIAGNOSIS — N189 Chronic kidney disease, unspecified: Secondary | ICD-10-CM | POA: Diagnosis not present

## 2022-04-01 DIAGNOSIS — M24562 Contracture, left knee: Secondary | ICD-10-CM | POA: Diagnosis not present

## 2022-04-01 DIAGNOSIS — Z9181 History of falling: Secondary | ICD-10-CM

## 2022-04-01 DIAGNOSIS — Z01810 Encounter for preprocedural cardiovascular examination: Secondary | ICD-10-CM | POA: Diagnosis not present

## 2022-04-01 DIAGNOSIS — N171 Acute kidney failure with acute cortical necrosis: Secondary | ICD-10-CM | POA: Diagnosis not present

## 2022-04-01 DIAGNOSIS — Z8249 Family history of ischemic heart disease and other diseases of the circulatory system: Secondary | ICD-10-CM

## 2022-04-01 DIAGNOSIS — I10 Essential (primary) hypertension: Secondary | ICD-10-CM | POA: Diagnosis not present

## 2022-04-01 DIAGNOSIS — Y832 Surgical operation with anastomosis, bypass or graft as the cause of abnormal reaction of the patient, or of later complication, without mention of misadventure at the time of the procedure: Secondary | ICD-10-CM | POA: Diagnosis not present

## 2022-04-01 DIAGNOSIS — I110 Hypertensive heart disease with heart failure: Secondary | ICD-10-CM | POA: Diagnosis not present

## 2022-04-01 DIAGNOSIS — F17200 Nicotine dependence, unspecified, uncomplicated: Secondary | ICD-10-CM | POA: Diagnosis not present

## 2022-04-01 DIAGNOSIS — R350 Frequency of micturition: Secondary | ICD-10-CM | POA: Diagnosis not present

## 2022-04-01 DIAGNOSIS — Z7189 Other specified counseling: Secondary | ICD-10-CM | POA: Diagnosis not present

## 2022-04-01 DIAGNOSIS — E7800 Pure hypercholesterolemia, unspecified: Secondary | ICD-10-CM | POA: Diagnosis not present

## 2022-04-01 DIAGNOSIS — I97638 Postprocedural hematoma of a circulatory system organ or structure following other circulatory system procedure: Secondary | ICD-10-CM | POA: Diagnosis not present

## 2022-04-01 DIAGNOSIS — S81801A Unspecified open wound, right lower leg, initial encounter: Secondary | ICD-10-CM | POA: Diagnosis not present

## 2022-04-01 DIAGNOSIS — R066 Hiccough: Secondary | ICD-10-CM | POA: Diagnosis not present

## 2022-04-01 HISTORY — PX: ABDOMINAL AORTOGRAM W/LOWER EXTREMITY: CATH118223

## 2022-04-01 HISTORY — DX: Tobacco use: Z72.0

## 2022-04-01 LAB — POCT I-STAT, CHEM 8
BUN: 15 mg/dL (ref 6–20)
Calcium, Ion: 1.26 mmol/L (ref 1.15–1.40)
Chloride: 100 mmol/L (ref 98–111)
Creatinine, Ser: 1.1 mg/dL (ref 0.61–1.24)
Glucose, Bld: 142 mg/dL — ABNORMAL HIGH (ref 70–99)
HCT: 48 % (ref 39.0–52.0)
Hemoglobin: 16.3 g/dL (ref 13.0–17.0)
Potassium: 4.1 mmol/L (ref 3.5–5.1)
Sodium: 139 mmol/L (ref 135–145)
TCO2: 26 mmol/L (ref 22–32)

## 2022-04-01 SURGERY — ABDOMINAL AORTOGRAM W/LOWER EXTREMITY
Anesthesia: LOCAL

## 2022-04-01 MED ORDER — MIDAZOLAM HCL 2 MG/2ML IJ SOLN
INTRAMUSCULAR | Status: AC
  Filled 2022-04-01: qty 2

## 2022-04-01 MED ORDER — FENTANYL CITRATE (PF) 100 MCG/2ML IJ SOLN
INTRAMUSCULAR | Status: DC | PRN
  Administered 2022-04-01 (×2): 25 ug via INTRAVENOUS

## 2022-04-01 MED ORDER — HYDRALAZINE HCL 20 MG/ML IJ SOLN
5.0000 mg | INTRAMUSCULAR | Status: AC | PRN
  Administered 2022-04-02 – 2022-04-04 (×2): 5 mg via INTRAVENOUS
  Filled 2022-04-01 (×2): qty 1

## 2022-04-01 MED ORDER — MORPHINE SULFATE (PF) 2 MG/ML IV SOLN
2.0000 mg | INTRAVENOUS | Status: DC | PRN
  Administered 2022-04-02 – 2022-04-07 (×2): 2 mg via INTRAVENOUS
  Filled 2022-04-01 (×2): qty 1

## 2022-04-01 MED ORDER — SODIUM CHLORIDE 0.9 % IV SOLN: INTRAVENOUS | Status: AC

## 2022-04-01 MED ORDER — MIDAZOLAM HCL 2 MG/2ML IJ SOLN
INTRAMUSCULAR | Status: DC | PRN
  Administered 2022-04-01 (×2): 1 mg via INTRAVENOUS

## 2022-04-01 MED ORDER — SODIUM CHLORIDE 0.9 % IV SOLN
250.0000 mL | INTRAVENOUS | Status: DC | PRN
  Administered 2022-04-13: 250 mL via INTRAVENOUS

## 2022-04-01 MED ORDER — HEPARIN (PORCINE) IN NACL 1000-0.9 UT/500ML-% IV SOLN
INTRAVENOUS | Status: AC
  Filled 2022-04-01: qty 500

## 2022-04-01 MED ORDER — LABETALOL HCL 5 MG/ML IV SOLN
10.0000 mg | INTRAVENOUS | Status: AC | PRN
  Administered 2022-04-02 (×4): 10 mg via INTRAVENOUS
  Filled 2022-04-01 (×4): qty 4

## 2022-04-01 MED ORDER — FENTANYL CITRATE (PF) 100 MCG/2ML IJ SOLN
INTRAMUSCULAR | Status: AC
  Filled 2022-04-01: qty 2

## 2022-04-01 MED ORDER — LIDOCAINE HCL (PF) 1 % IJ SOLN
INTRAMUSCULAR | Status: AC
  Filled 2022-04-01: qty 30

## 2022-04-01 MED ORDER — HYDRALAZINE HCL 20 MG/ML IJ SOLN
INTRAMUSCULAR | Status: AC
  Filled 2022-04-01: qty 1

## 2022-04-01 MED ORDER — ONDANSETRON HCL 4 MG/2ML IJ SOLN: 4.0000 mg | Freq: Four times a day (QID) | INTRAMUSCULAR | Status: DC | PRN

## 2022-04-01 MED ORDER — CARVEDILOL 6.25 MG PO TABS
6.2500 mg | ORAL_TABLET | Freq: Two times a day (BID) | ORAL | Status: DC
  Administered 2022-04-01 – 2022-04-05 (×9): 6.25 mg via ORAL
  Filled 2022-04-01 (×11): qty 1

## 2022-04-01 MED ORDER — LABETALOL HCL 5 MG/ML IV SOLN
INTRAVENOUS | Status: AC
  Filled 2022-04-01: qty 4

## 2022-04-01 MED ORDER — HYDRALAZINE HCL 20 MG/ML IJ SOLN
10.0000 mg | Freq: Once | INTRAMUSCULAR | Status: AC
  Administered 2022-04-06: 10 mg via INTRAVENOUS

## 2022-04-01 MED ORDER — MORPHINE SULFATE (PF) 2 MG/ML IV SOLN
2.0000 mg | Freq: Once | INTRAVENOUS | Status: AC
  Administered 2022-04-01: 2 mg via INTRAVENOUS
  Filled 2022-04-01: qty 1

## 2022-04-01 MED ORDER — ATORVASTATIN CALCIUM 40 MG PO TABS
40.0000 mg | ORAL_TABLET | Freq: Every day | ORAL | Status: DC
  Administered 2022-04-01 – 2022-04-06 (×6): 40 mg via ORAL
  Filled 2022-04-01 (×6): qty 1

## 2022-04-01 MED ORDER — LABETALOL HCL 5 MG/ML IV SOLN
INTRAVENOUS | Status: DC | PRN
  Administered 2022-04-01: 10 mg via INTRAVENOUS

## 2022-04-01 MED ORDER — LIDOCAINE HCL (PF) 1 % IJ SOLN
INTRAMUSCULAR | Status: DC | PRN
  Administered 2022-04-01: 15 mL
  Administered 2022-04-01: 10 mL

## 2022-04-01 MED ORDER — SODIUM CHLORIDE 0.9% FLUSH: 3.0000 mL | INTRAVENOUS | Status: DC | PRN

## 2022-04-01 MED ORDER — HEPARIN (PORCINE) IN NACL 1000-0.9 UT/500ML-% IV SOLN
INTRAVENOUS | Status: DC | PRN
  Administered 2022-04-01 (×2): 500 mL

## 2022-04-01 MED ORDER — ACETAMINOPHEN 325 MG PO TABS
650.0000 mg | ORAL_TABLET | ORAL | Status: DC | PRN
  Administered 2022-04-03: 650 mg via ORAL
  Filled 2022-04-01: qty 2

## 2022-04-01 MED ORDER — HYDRALAZINE HCL 20 MG/ML IJ SOLN
INTRAMUSCULAR | Status: DC | PRN
  Administered 2022-04-01: 10 mg via INTRAVENOUS

## 2022-04-01 MED ORDER — SODIUM CHLORIDE 0.9 % IV SOLN: INTRAVENOUS | Status: DC

## 2022-04-01 MED ORDER — SODIUM CHLORIDE 0.9% FLUSH
3.0000 mL | Freq: Two times a day (BID) | INTRAVENOUS | Status: DC
  Administered 2022-04-02 – 2022-05-19 (×67): 3 mL via INTRAVENOUS

## 2022-04-01 SURGICAL SUPPLY — 11 items
CATH OMNI FLUSH 5F 65CM (CATHETERS) ×1 IMPLANT
KIT MICROPUNCTURE NIT STIFF (SHEATH) ×1 IMPLANT
KIT PV (KITS) ×2 IMPLANT
SHEATH PINNACLE 5F 10CM (SHEATH) ×1 IMPLANT
SHEATH PROBE COVER 6X72 (BAG) ×1 IMPLANT
SYR MEDRAD MARK 7 150ML (SYRINGE) ×2 IMPLANT
TRANSDUCER W/STOPCOCK (MISCELLANEOUS) ×2 IMPLANT
TRAY PV CATH (CUSTOM PROCEDURE TRAY) ×2 IMPLANT
TUBING INJECTOR 48 (MISCELLANEOUS) ×1 IMPLANT
WIRE BENTSON .035X145CM (WIRE) ×1 IMPLANT
WIRE TORQFLEX AUST .018X40CM (WIRE) ×1 IMPLANT

## 2022-04-01 NOTE — Op Note (Signed)
Patient name: Mical Kicklighter MRN: [id number] DOB: 15-May-1971 Sex: male  04/01/2022 Pre-operative Diagnosis:  1.  Critical limb ischemia of the left lower extremity with tissue loss 2.  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  Attempted ultrasound-guided access left common femoral artery 2.  Ultrasound-guided access right common femoral artery 3.  Aortogram with catheter selection of aorta 4.  Bilateral lower extremity arteriogram with runoff 5.  32 minutes monitored moderate conscious sedation time  Indications: 51 year old male who presented to the office on Tuesday with evidence of critical limb ischemia with tissue loss in the left lower extremity with a ulcer over his lateral ankle and also an open ulcer on the left fourth toe.  ABIs were 0.  CT scan from the ED showed left common and external iliac occlusion with significant infrainguinal occlusive disease.  He is here today for evaluation of targets in the left lower extremity needing multilevel revascularization.  Findings:   Aortogram showed his infrarenal aorta was very diseased but patent.  The left common and external iliac artery are completely occluded.  The right common and external iliac artery are patent with multiple levels of high-grade calcified stenosis.  The left common femoral artery was underfilled due to the proximal iliac artery occlusion and was poorly visualized.    The right common femoral is patent with two very diseased profunda branches.  He has a short stump of SFA on the right that then occludes.  There is no visualized SFA on the left.  We had a very difficult time evaluating targets on the left with mostly collaterals in the left leg.  With a high volume of contrast on delayed imaging we did visualize a posterior tibial in the  mid calf on the left.   Procedure:  The patient was identified in the holding area and taken to room 8.  The patient was then placed supine on the table and prepped and draped in the usual sterile fashion.  A time out was called.  Patient received Versed and fentanyl for moderate sedation.  We monitored heart rate, oxygenation, blood pressure and respiratory rate and I was present for all of sedation.  Ultrasound was initially used to evaluate the left common femoral artery.  I knew that there was a more proximal iliac occlusion.  I wanted to put a sheath in the left groin in order to evaluate runoff for target.  I placed a microneedle into the left common femoral artery and then a microwire but this would only advance a couple centimeters and I did not have enough purchase to place the sheath.  I then went to the right groin where he had a femoral pulse.  This was evaluated with ultrasound and was patent.  Micro access needle was placed in the artery with a microwire and a micro sheath.  Advanced a Bentson wire to the infrarenal aorta and then placed a 5 French sheath and then an Omni Flush catheter.  We then got an abdominal aortogram with bilateral lower extremity runoff.  He has significant aortoiliac and infrainguinal disease that made visualizing targets extremely difficult.  Pertinent findings are noted above.  Wires and catheters were removed.  Taken to holding to have the sheath removed  Plan: Patient is being evaluated for complex multilevel revascularization including aortobifemoral bypass and left femoral to tibial bypass.  I am going to admit him for cardiac work-up.  Echocardiogram and stress test tomorrow.  We will further discuss options with the patient as he has severe multilevel occlusive disease.  Cephus Shelling, MD [facility] Office: [phone]

## 2022-04-01 NOTE — Progress Notes (Signed)
Site area: Right groin a 5 french arterial sheath was removed Jeffrey Fitzgerald  Site Prior to Removal:  Level 0  Pressure Applied For 20 MINUTES    Bedrest Beginning at 1715 X 4 hours  Manual:   Yes.    Patient Status During Pull:  stable  Post Pull Groin Site:  Level 0  Post Pull Instructions Given:  Yes.    Post Pull Pulses Present:  Yes.    Dressing Applied:  Yes.    Comments:

## 2022-04-01 NOTE — H&P (Signed)
Patient name: Jeffrey Fitzgerald          MRN: [id number]        DOB: [birth year]        Sex: male   REASON FOR CONSULT: Critical limb ischemia of left lower extremity with wound   HPI: Jeffrey Fitzgerald is a 51 y.o. male, with history of hypertension that presents for evaluation of critical limb ischemia of the left lower extremity with tissue loss.  Patient states he has had pain in his left foot for the last 2 years.  He cannot sleep at night.  States he really has had difficult time even moving his foot for several years.  He has since developed a wound on the left lateral ankle as well as the left fourth toe.  He states this was about a month ago when he fell out of his scooter.  No previous abdominal surgery.  States he smokes half a pack of cigarettes a day for the last 30 years.  Denies other illicit drug use.  He denies any problems with his right lower extremity       Past Medical History:  Diagnosis Date   Hypertension        History reviewed. No pertinent surgical history.        Family History  Problem Relation Age of Onset   Hypertension Father     Heart disease Father        SOCIAL HISTORY: Social History         Socioeconomic History   Marital status: Single      Spouse name: Not on file   Number of children: Not on file   Years of education: Not on file   Highest education level: Not on file  Occupational History   Not on file  Tobacco Use   Smoking status: Every Day      Packs/day: 0.50      Years: 30.00      Total pack years: 15.00      Types: Cigarettes   Smokeless tobacco: Never  Vaping Use   Vaping Use: Never used  Substance and Sexual Activity   Alcohol use: Yes      Comment: occasionally   Drug use: Yes      Types: Marijuana   Sexual activity: Not on file  Other Topics Concern   Not on file  Social History Narrative   Not on file    Social Determinants of Health    Financial Resource Strain: Not on file  Food Insecurity: Not on file   Transportation Needs: Not on file  Physical Activity: Not on file  Stress: Not on file  Social Connections: Not on file  Intimate Partner Violence: Not on file      No Known Allergies         Current Outpatient Medications  Medication Sig Dispense Refill   cyclobenzaprine (FLEXERIL) 10 MG tablet Take 1 tablet (10 mg total) by mouth at bedtime. 7 tablet 0   diclofenac Sodium (VOLTAREN) 1 % GEL Apply 4 g topically 4 (four) times daily. 350 g 0   meloxicam (MOBIC) 15 MG tablet Take 1 tablet (15 mg total) by mouth daily. 14 tablet 0   amLODipine (NORVASC) 5 MG tablet Take 1 tablet (5 mg total) by mouth daily. 90 tablet 0   hydrochlorothiazide (HYDRODIURIL) 25 MG tablet Take 1 tablet (25 mg total) by mouth daily. 90 tablet 0    No current facility-administered medications for this visit.  REVIEW OF SYSTEMS:  [X]  denotes positive finding, [ ]  denotes negative finding Cardiac   Comments:  Chest pain or chest pressure:      Shortness of breath upon exertion:      Short of breath when lying flat:      Irregular heart rhythm:             Vascular      Pain in calf, thigh, or hip brought on by ambulation:      Pain in feet at night that wakes you up from your sleep:  x Left   Blood clot in your veins:      Leg swelling:              Pulmonary      Oxygen at home:      Productive cough:       Wheezing:              Neurologic      Sudden weakness in arms or legs:       Sudden numbness in arms or legs:       Sudden onset of difficulty speaking or slurred speech:      Temporary loss of vision in one eye:       Problems with dizziness:              Gastrointestinal      Blood in stool:       Vomited blood:              Genitourinary      Burning when urinating:       Blood in urine:             Psychiatric      Major depression:              Hematologic      Bleeding problems:      Problems with blood clotting too easily:             Skin      Rashes or ulcers:              Constitutional      Fever or chills:          PHYSICAL EXAM:    Vitals:    03/30/22 1449  BP: (!) 180/90  Pulse: (!) 111  Resp: 16  Temp: 98.2 F (36.8 C)  TempSrc: Temporal  SpO2: 96%  Weight: 145 lb (65.8 kg)  Height: 5\' 5"  (1.651 m)      GENERAL: The patient is a well-nourished male, in no acute distress. The vital signs are documented above. CARDIAC: There is a regular rate and rhythm.  VASCULAR:  Right femoral pulse palpable Left femoral pulse nonpalpable Left lateral ankle ulcer pictured Left 4th toe with open ulcer and purulent drainage PULMONARY: No respiratory distress. ABDOMEN: Soft and non-tender. MUSCULOSKELETAL: There are no major deformities or cyanosis. NEUROLOGIC: No focal weakness or paresthesias are detected. SKIN: There are no ulcers or rashes noted. PSYCHIATRIC: The patient has a normal affect.        DATA:    ABIs today are 0.33 on the right dampened monophasic and absent on the left   CTA reviewed from 03/08/2022 showing a long segment left common and external iliac occlusion.  He reconstitutes his common femoral with disease in the proximal profunda.  He has significant infrainguinal disease with an occluded left SFA and likely popliteal and tibial disease.  CTA shows  some purulence draining from the left 4th toe and I would like to put him on antibiotics prior to bypass.  I discussed I would tentatively schedule his aortobifemoral bypass with left lower extremity bypass next Wednesday, 04/07/2022.  Discussed that this is a high cardiac risk procedure with aortobifemoral bypass and we will need to ensure cardiology feels he can tolerate the operation.  I discussed risk of aortobifemoral bypass including risk of heart attack, stroke, renal failure, wound problems including graft infection, risk of anesthesia, risk of bleeding, prolonged hospital stay, even death.    Jeffrey Mackowiak J. Perris Conwell, MD [facility] [place] Office: [phone]     

## 2022-04-01 NOTE — Progress Notes (Signed)
Patient arrived to 4E from the cath lab. Vitals taken and stable. Tele placed and CCMD notified. Patient on bedrest until 2200. Patient verbally acknowledged bedrest. Patient oriented to unit and staff. Call bell within reach. CHG completed.  [place] C Alexa Golebiewski

## 2022-04-01 NOTE — Consult Note (Addendum)
Cardiology Consultation:   Patient ID: Jeffrey Fitzgerald MRN: [id number]; DOB: 03-08-1971  Admit date: 04/01/2022 Date of Consult: 04/01/2022  PCP:  Patient, No Pcp Per   [facility] Providers Cardiologist:  Jeffrey Rotunda, MD   {  Patient Profile:   Jeffrey Fitzgerald is a 51 y.o. male with a hx of HTN, tobacco smoking and PVD who is being seen 04/01/2022 for the evaluation of pre op cardiac clearance for aortobifemoral bypass  at the request of Jeffrey Fitzgerald.  History of Present Illness:   Jeffrey Fitzgerald had no prior cardiac hx. Established care with Jeffrey Fitzgerald 03/30/2022 for critical limb ischemia of left lower extremity with wound. Has painful left foot for past 2 years. Mostly sedentary life style and using electric chair/scooter for movement due to worsening left leg pain. Has unsteady gait. He fall from scooter about 1 month ago and  how has wound on left fourth toe and left lateral ankle.  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   ABI 03/30/2022: Right 0.33 Left absent   He presented today for abdominal aortogram with LE extremity today and plan to keep here until scheduled aorta bifemoral bypass w/ left leg on 04/07/2022. Cardiology is asked for surgical clearance.   He smokes 1/2 pack of cigarettes for last 30 years. He used to smoke marijuana but hasn't done in past 6 months. No hx of cocaine use.   He was diagnosed with HTN about 5 years ago. Does not have PCP. He is not taking antihypertensive medication of past one year as unable to refill this.    Past Medical History:  Diagnosis Date   Hypertension    Tobacco abuse     History reviewed. No pertinent surgical history.    Inpatient Medications: Scheduled Meds:  carvedilol  6.25 mg Oral BID WC   hydrALAZINE  10 mg Intravenous Once    Continuous Infusions:  sodium chloride 100  mL/hr at 04/01/22 1150   PRN Meds:   Allergies:   No Known Allergies  Social History:   Social History   Socioeconomic History   Marital status: Single    Spouse name: Not on file   Number of children: Not on file   Years of education: Not on file   Highest education level: Not on file  Occupational History   Not on file  Tobacco Use   Smoking status: Every Day    Packs/day: 0.50    Years: 30.00    Total pack years: 15.00    Types: Cigarettes   Smokeless tobacco: Never  Vaping Use   Vaping Use: Never used  Substance and Sexual Activity   Alcohol use: Yes    Comment: occasionally   Drug use: Yes    Types: Marijuana   Sexual activity: Not on file  Other Topics Concern   Not on file  Social History Narrative   He is currently living with his mom.  He still smokes cigarettes.  Used to work in a [employer] .   Social Determinants of Health   Financial Resource Strain: Not on file  Food Insecurity: Not on file  Transportation Needs: Not on file  Physical Activity: Not on file  Stress: Not on file  Social Connections: Not on file  Intimate Partner Violence: Not on file    Family History:    Family History  Problem Relation Age of  Onset   Hypertension Father    Heart disease Father        No details.  Died in his earlies 9s and might have had heart attacks or coronary disease prior to that.     ROS:  Please see the history of present illness.  All other ROS reviewed and negative.     Physical Exam/Data:   Vitals:   04/01/22 1121 04/01/22 1135  BP: (!) 195/118 (!) 216/121  Pulse: (!) 125 (!) 136  Temp: 97.7 F (36.5 C)   TempSrc: Temporal   SpO2: 100% 100%  Weight: 65.8 kg   Height: 5\' 5"  (1.651 m)    No intake or output data in the 24 hours ending 04/01/22 1423    04/01/2022   11:21 AM 03/30/2022    2:49 PM 03/09/2022   10:58 PM  Last 3 Weights  Weight (lbs) 145 lb 145 lb 145 lb  Weight (kg) 65.772 kg 65.772 kg 65.772 kg     Body mass index is  24.13 kg/m.  General:  Well nourished, well developed, in no acute distress HEENT: normal Neck: no JVD Vascular: No carotid bruits; Distal pulses 2+ bilaterally Cardiac:  normal S1, S2; regular tachycardic; no murmur  Lungs:  clear to auscultation bilaterally, no wheezing, rhonchi or rales  Abd: soft, nontender, no hepatomegaly  Ext: no edema, Left lateral ankle and 4th toe with ulcer Musculoskeletal:  BUE and BLE strength normal and equal Skin: warm and dry  Neuro:  CNs 2-12 intact, no focal abnormalities noted Psych:  Normal affect   EKG:  The EKG was personally reviewed and demonstrates:  Sinus tachycardia, LVH Telemetry:  Telemetry was personally reviewed and demonstrates:  N/A  Relevant CV Studies: N/A  Laboratory Data:  Chemistry Recent Labs  Lab 04/01/22 1130  NA 139  K 4.1  CL 100  GLUCOSE 142*  BUN 15  CREATININE 1.10    Hematology Recent Labs  Lab 04/01/22 1130  HGB 16.3  HCT 48.0   Radiology/Studies:  VAS 06/01/22 ABI WITH/WO TBI  Result Date: 03/30/2022  LOWER EXTREMITY DOPPLER STUDY [name]  Date of Exam:   03/30/2022 Medical Rec #: [id number]         Accession #:    [id number] Date of Birth: 1970-12-03        Patient Gender: M Patient Age:   53 years Exam Location:  [facility] Procedure:      VAS Rudene Anda ABI WITH/WO TBI Referring Phys: --------------------------------------------------------------------------------  Indications: Rest pain, and ulceration. High Risk Factors: Current smoker.  Vascular Interventions: 03/08/2022 CT Angio:                         Left lower extremity:                          1. Long segment common iliac artery occlusion extending                         into the                         internal and external iliac arteries. Distal                         reconstitution at the  distal external iliac.                         2. Diffusely diseased superficial femoral artery,                          intermittently                         occluded including a 13 cm length occlusion. Distal                         superficial                         femoral artery is diffusely diseased and small in                         caliber with                         varying degrees of stenosis.                         3. Calf vessels are diffusely calcified and small in                         caliber. There                         is 2 vessel runoff to the foot.                          NON-VASCULAR. Limitations: Today's exam was limited due to Poor patient cooperation. Performing Technologist: Dorthula Matas RVS, RCS  Examination Guidelines: A complete evaluation includes at minimum, Doppler waveform signals and systolic blood pressure reading at the level of bilateral brachial, anterior tibial, and posterior tibial arteries, when vessel segments are accessible. Bilateral testing is considered an integral part of a complete examination. Photoelectric Plethysmograph (PPG) waveforms and toe systolic pressure readings are included as required and additional duplex testing as needed. Limited examinations for reoccurring indications may be performed as noted.  ABI Findings: +-----+------------------+-----+-------------------+--------+ RightRt Pressure (mmHg)IndexWaveform           Comment  +-----+------------------+-----+-------------------+--------+ PTA  70                0.32 dampened monophasic         +-----+------------------+-----+-------------------+--------+ DP   72                0.33 dampened monophasic         +-----+------------------+-----+-------------------+--------+ +--------+------------------+-----+--------+-------+ Left    Lt Pressure (mmHg)IndexWaveformComment +--------+------------------+-----+--------+-------+ [id number]                                    +--------+------------------+-----+--------+-------+ PTA                            absent           +--------+------------------+-----+--------+-------+ DP  absent          +--------+------------------+-----+--------+-------+ +-------+-----------+----------------+------------+------------+ ABI/TBIToday's ABIToday's TBI     Previous ABIPrevious TBI +-------+-----------+----------------+------------+------------+ Right  0.33       unable to obtain                         +-------+-----------+----------------+------------+------------+ Left   absent     unable to obtain                         +-------+-----------+----------------+------------+------------+   Summary: Right: Resting right ankle-brachial index indicates severe right lower extremity arterial disease. Left: Resting left pedal pulses could not be detected.  *See table(s) above for measurements and observations.  Electronically signed by Sherald Hesshristopher Clark MD on 03/30/2022 at 4:40:14 PM.    Final      Assessment and Plan:   Critical limb ischemia of left lower extremity with wound PAD - Plan for abdominal aortogram with LE extremity today & keep here until scheduled aorta bifemoral bypass w/ left leg on 04/07/2022 -Consider Lipid panel, TSH, UA and HgbA1c   3. Hypertensive Urgency -Dx 5 years ago. Not taking antihypertensive for past 1 year. - BP 216/1231 - Start Coreg 6.25mg  BID (helps with tachycardia as well) - Give IV hydralazine 10mg  x 1  4. Pre-op cardiac clearance - Patient is not getting > 4 Mets of activity. Get stress test tomorrow and echo.   5. Tobacco use - Recommended cessation  He will need CM and SW help to established primary care.    For questions or updates, please contact [facility] Please consult [email or web address] for contact info under    Signed, Jeffrey RotundaJames Cheyenna Pankowski, MD  04/01/2022 2:23 PM   History and all data above reviewed.  Patient examined.  I agree with the findings as above.   The patient presents for preoperative evaluation prior to her extremity  revascularization.  He has no significant past cardiac history although he has not had a lot of medical care.  He says he has been getting leg pain for about 2 years dating back to when he was living in the [place].  Been there about a year.  This has progressed to pain at rest and nonhealing left lower extremity wounds.  He has been very limited in his activities because of this and actually getting around with a scooter.  He is not describing any chest discomfort, neck or arm discomfort.  Not describing any shortness of breath.  However, his functional level is very low.  He is not having any palpitations, presyncope or syncope.  He still smokes cigarettes.  The patient exam reveals COR: Regular rate and rhythm, S1 and S2 within normal limits, no S3, no S4, no murmurs.,  Lungs: Clear to auscultation bilaterally,  Abd: Positive bowel sounds normal in frequency and pitch, no rebound or guarding. Ext 2+ pulses, absent dorsalis pedis posterior tibialis, unable to appreciate popliteals, nonhealing ulcers on the left lower extremity.  All available labs, radiology testing, previous records reviewed. Agree with documented assessment and plan.  Preoperative evaluation: The patient has very low functional status.  Significant vascular disease.  He is going for high risk procedure.  I made it very prudent to screen him with an echocardiogram and perfusion study prior to surgery.  I discussed this with the patient and he understands and agrees with that approach.  Tobacco: We discussed need to stop smoking we will continue to educate him.  Risk reduction we will be checking a lipid, TSH and A1c.  Hypertension: Blood pressure is not well controlled.  Will be managed as above.  Jeffrey Fitzgerald  2:23 PM  04/01/2022

## 2022-04-02 ENCOUNTER — Inpatient Hospital Stay (HOSPITAL_COMMUNITY): Payer: Medicaid Other

## 2022-04-02 ENCOUNTER — Encounter (HOSPITAL_COMMUNITY): Payer: Self-pay | Admitting: Vascular Surgery

## 2022-04-02 DIAGNOSIS — Z01810 Encounter for preprocedural cardiovascular examination: Secondary | ICD-10-CM

## 2022-04-02 DIAGNOSIS — I70223 Atherosclerosis of native arteries of extremities with rest pain, bilateral legs: Secondary | ICD-10-CM

## 2022-04-02 LAB — NM MYOCAR MULTI W/SPECT W/WALL MOTION / EF
Estimated workload: 1
Exercise duration (min): 5 min
Exercise duration (sec): 16 s
Peak HR: 117 {beats}/min
Rest HR: 90 {beats}/min
ST Depression (mm): 0 mm

## 2022-04-02 LAB — ECHOCARDIOGRAM COMPLETE
Height: 65 in
S' Lateral: 3.2 cm
Weight: 2320 oz

## 2022-04-02 LAB — LIPID PANEL
Cholesterol: 207 mg/dL — ABNORMAL HIGH (ref 0–200)
HDL: 45 mg/dL (ref >40)
LDL Cholesterol: 144 mg/dL — ABNORMAL HIGH (ref 0–99)
Total CHOL/HDL Ratio: 4.6 RATIO
Triglycerides: 92 mg/dL (ref <150)
VLDL: 18 mg/dL (ref 0–40)

## 2022-04-02 LAB — TSH: TSH: 0.753 u[IU]/mL (ref 0.350–4.500)

## 2022-04-02 LAB — HEMOGLOBIN A1C
Hgb A1c MFr Bld: 5.6 % (ref 4.8–5.6)
Mean Plasma Glucose: 114.02 mg/dL

## 2022-04-02 MED ORDER — TECHNETIUM TC 99M TETROFOSMIN IV KIT
10.1000 | PACK | Freq: Once | INTRAVENOUS | Status: AC | PRN
  Administered 2022-04-02: 10.1 via INTRAVENOUS

## 2022-04-02 MED ORDER — TECHNETIUM TC 99M TETROFOSMIN IV KIT
30.8000 | PACK | Freq: Once | INTRAVENOUS | Status: AC | PRN
  Administered 2022-04-02: 30.8 via INTRAVENOUS

## 2022-04-02 MED ORDER — PIPERACILLIN-TAZOBACTAM 3.375 G IVPB
3.3750 g | Freq: Three times a day (TID) | INTRAVENOUS | Status: AC
  Administered 2022-04-02 – 2022-04-15 (×39): 3.375 g via INTRAVENOUS
  Filled 2022-04-02 (×40): qty 50

## 2022-04-02 MED ORDER — REGADENOSON 0.4 MG/5ML IV SOLN
0.4000 mg | Freq: Once | INTRAVENOUS | Status: AC
  Filled 2022-04-02: qty 5

## 2022-04-02 MED ORDER — NICOTINE 14 MG/24HR TD PT24
14.0000 mg | MEDICATED_PATCH | Freq: Every day | TRANSDERMAL | Status: DC
  Administered 2022-04-02 – 2022-04-06 (×4): 14 mg via TRANSDERMAL
  Filled 2022-04-02 (×5): qty 1

## 2022-04-02 MED ORDER — AMLODIPINE BESYLATE 5 MG PO TABS
5.0000 mg | ORAL_TABLET | Freq: Every day | ORAL | Status: DC
  Administered 2022-04-02 – 2022-04-09 (×7): 5 mg via ORAL
  Filled 2022-04-02 (×7): qty 1

## 2022-04-02 MED ORDER — OXYCODONE-ACETAMINOPHEN 5-325 MG PO TABS
1.0000 | ORAL_TABLET | ORAL | Status: DC | PRN
  Administered 2022-04-02 – 2022-05-11 (×33): 2 via ORAL
  Filled 2022-04-02 (×8): qty 2
  Filled 2022-04-02: qty 1
  Filled 2022-04-02 (×7): qty 2
  Filled 2022-04-02: qty 1
  Filled 2022-04-02 (×20): qty 2

## 2022-04-02 MED ORDER — VANCOMYCIN HCL 1500 MG/300ML IV SOLN
1500.0000 mg | Freq: Once | INTRAVENOUS | Status: AC
  Administered 2022-04-02: 1500 mg via INTRAVENOUS
  Filled 2022-04-02: qty 300

## 2022-04-02 MED ORDER — VANCOMYCIN HCL 750 MG/150ML IV SOLN
750.0000 mg | Freq: Two times a day (BID) | INTRAVENOUS | Status: DC
  Administered 2022-04-02 – 2022-04-04 (×4): 750 mg via INTRAVENOUS
  Filled 2022-04-02 (×5): qty 150

## 2022-04-02 MED ORDER — REGADENOSON 0.4 MG/5ML IV SOLN
INTRAVENOUS | Status: AC
  Administered 2022-04-02: 0.4 mg via INTRAVENOUS
  Filled 2022-04-02: qty 5

## 2022-04-02 NOTE — Progress Notes (Signed)
  Echocardiogram 2D Echocardiogram has been performed.  Leta Jungling M 04/02/2022, 8:03 AM

## 2022-04-02 NOTE — Progress Notes (Signed)
Jeffrey Fitzgerald presented for a nuclear stress test today.  No immediate complications.  Stress imaging is pending at this time.   Preliminary EKG findings may be listed in the chart, but the stress test result will not be finalized until perfusion imaging is complete.   Manson Passey, [place]  04/02/2022 12:54 PM

## 2022-04-02 NOTE — Progress Notes (Signed)
Pharmacy Antibiotic Note  Jeffrey Fitzgerald is a 51 y.o. male admitted on 04/01/2022 with critical limb ischemia, wound with drainage.  Pharmacy has been consulted for Vancomycin / Zosyn dosing.  Plan: Vancomycin 1500 mg iv x 1 then 750 mg iv Q 12 Zosyn 4 hr infusion  Follow up vascular plan  Height: 5\' 5"  (165.1 cm) Weight: 65.8 kg (145 lb) IBW/kg (Calculated) : 61.5  Temp (24hrs), Avg:98.1 F (36.7 C), Min:97.7 F (36.5 C), Max:98.7 F (37.1 C)  Recent Labs  Lab 04/01/22 1130  CREATININE 1.10    Estimated Creatinine Clearance: 69.9 mL/min (by C-G formula based on SCr of 1.1 mg/dL).    No Known Allergies  Thank you for allowing pharmacy to be a part of this patient's care. 06/01/22, PharmD  04/02/2022 8:50 AM

## 2022-04-02 NOTE — Progress Notes (Addendum)
  Progress Note    04/02/2022 7:54 AM 1 Day Post-Op  Subjective:  L foot pain   Vitals:   04/02/22 0646 04/02/22 0700  BP: (!) 179/100 (!) 169/103  Pulse: 88 88  Resp:    Temp:    SpO2: 100% 100%   Physical Exam: Lungs:  non labored Incisions:  R groin cath site without hematoma Ext: purulent drainage L 4th toe Neurologic: A&O  CBC    Component Value Date/Time   WBC 8.2 03/09/2022 2310   RBC 5.08 03/09/2022 2310   HGB 16.3 04/01/2022 1130   HCT 48.0 04/01/2022 1130   PLT 268 03/09/2022 2310   MCV 89.2 03/09/2022 2310   MCH 28.1 03/09/2022 2310   MCHC 31.6 03/09/2022 2310   RDW 15.1 03/09/2022 2310   LYMPHSABS 4.3 (H) 03/09/2022 2310   MONOABS 0.5 03/09/2022 2310   EOSABS 0.1 03/09/2022 2310   BASOSABS 0.0 03/09/2022 2310    BMET    Component Value Date/Time   NA 139 04/01/2022 1130   K 4.1 04/01/2022 1130   CL 100 04/01/2022 1130   CO2 27 03/09/2022 2310   GLUCOSE 142 (H) 04/01/2022 1130   BUN 15 04/01/2022 1130   CREATININE 1.10 04/01/2022 1130   CALCIUM 9.7 03/09/2022 2310   GFRNONAA >60 03/09/2022 2310   GFRAA >60 01/12/2020 1421    INR    Component Value Date/Time   INR 0.9 03/09/2022 2310     Intake/Output Summary (Last 24 hours) at 04/02/2022 0754 Last data filed at 04/02/2022 0335 Gross per 24 hour  Intake 273.7 ml  Output 300 ml  Net -26.3 ml     Assessment/Plan:  51 y.o. male is s/p Aortogram 1 Day Post-Op   Based on angiography, plan will be for Aortobifemoral bypass with LLE bypass.  He was admitted for pre-op cardiac workup.  He is scheduled for a stress test today.  We will start the patient on broad spectrum IV antibiotics due to purulent drainage from L 4th toe.  The above surgery is tentatively planned for 8/9.   Emilie Rutter, PA-C [facility] [phone] 04/02/2022 7:54 AM   I have seen and evaluated the patient. I agree with the PA note as documented above.  51 year old male admitted for critical  limb ischemia with tissue loss in the left lower extremity with extensive multilevel occlusive disease.  He underwent angiogram yesterday to evaluate for distal target.  Tentative plan is aortobifemoral bypass with a left fem-tib bypass on Wednesday pending further work-up.  Appreciate cardiology input.  Echo and stress test today.  We will start broad-spectrum antibiotics for purulent drainage from the left fourth toe wound.  He appears to have some cognitive delay.  He does not have any family that I can talk to.  I have updated him on the plan of care.  Cephus Shelling, MD [facility] [place] Office: [phone]

## 2022-04-02 NOTE — Progress Notes (Signed)
Patient returned from stress test. Vitals stable and patient oriented to unit and staff. Call bell within reach. Jeffrey Fitzgerald

## 2022-04-02 NOTE — Progress Notes (Addendum)
Progress Note  Patient Name: Jeffrey Fitzgerald Date of Encounter: 04/02/2022  [facility] Cardiologist: Minus Breeding, MD   Subjective   Pt sitting on side of bed. Only complaint is left foot pain.   Inpatient Medications    Scheduled Meds:  atorvastatin  40 mg Oral Daily   carvedilol  6.25 mg Oral BID WC   hydrALAZINE  10 mg Intravenous Once   sodium chloride flush  3 mL Intravenous Q12H   Continuous Infusions:  sodium chloride     PRN Meds: sodium chloride, acetaminophen, hydrALAZINE, morphine injection, ondansetron (ZOFRAN) IV, sodium chloride flush   Vital Signs    Vitals:   04/02/22 0500 04/02/22 0530 04/02/22 0600 04/02/22 0646  BP: (!) 181/110 (!) 175/101 (!) 175/103 (!) 179/100  Pulse: 93 95 87 88  Resp:      Temp:      TempSrc:      SpO2: 100% 100% 100% 100%  Weight:      Height:        Intake/Output Summary (Last 24 hours) at 04/02/2022 0658 Last data filed at 04/02/2022 0335 Gross per 24 hour  Intake 273.7 ml  Output 300 ml  Net -26.3 ml      04/01/2022   11:21 AM 03/30/2022    2:49 PM 03/09/2022   10:58 PM  Last 3 Weights  Weight (lbs) 145 lb 145 lb 145 lb  Weight (kg) 65.772 kg 65.772 kg 65.772 kg      Telemetry    NA - Personally Reviewed  ECG    No new tracings - Personally Reviewed  Physical Exam   GEN: No acute distress.   Neck: No JVD Cardiac: RRR, no murmurs, rubs, or gallops.  Respiratory: Clear to auscultation bilaterally. GI: Soft, nontender, non-distended  MS: No edema; Neuro:  Nonfocal  Psych: Normal affect   Labs    High Sensitivity Troponin:  No results for input(s): "TROPONINIHS" in the last 720 hours.   Chemistry Recent Labs  Lab 04/01/22 1130  NA 139  K 4.1  CL 100  GLUCOSE 142*  BUN 15  CREATININE 1.10    Lipids  Recent Labs  Lab 04/02/22 0304  CHOL 207*  TRIG 92  HDL 45  LDLCALC 144*  CHOLHDL 4.6    Hematology Recent Labs  Lab 04/01/22 1130  HGB 16.3  HCT 48.0   Thyroid  Recent Labs   Lab 04/02/22 0304  TSH 0.753    BNPNo results for input(s): "BNP", "PROBNP" in the last 168 hours.  DDimer No results for input(s): "DDIMER" in the last 168 hours.   Radiology    PERIPHERAL VASCULAR CATHETERIZATION  Result Date: [id number] Patient name: Jeffrey Fitzgerald          MRN: [id number]        DOB: Oct 16, 1970        Sex: male  04/01/2022 Pre-operative Diagnosis: 1.  Critical limb ischemia of the left lower extremity with tissue loss 2.  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  Attempted ultrasound-guided access left common femoral artery 2.  Ultrasound-guided access right common femoral artery 3.  Aortogram with catheter selection of aorta 4.  Bilateral lower extremity arteriogram with runoff 5.  32 minutes monitored moderate conscious sedation time  Indications: 51 year old male who presented to the office on Tuesday with evidence of critical limb ischemia  with tissue loss in the left lower extremity with a ulcer over his lateral ankle and also an open ulcer on the left fourth toe.  ABIs were 0.  CT scan from the ED showed left common and external iliac occlusion with significant infrainguinal occlusive disease.  He is here today for evaluation of targets in the left lower extremity needing multilevel revascularization.  Findings:  Aortogram showed his infrarenal aorta was very diseased but patent.  The left common and external iliac artery are completely occluded.  The right common and external iliac artery are patent with multiple levels of high-grade calcified stenosis.  The left common femoral artery was underfilled due to the proximal iliac artery occlusion and was poorly visualized.   The right common femoral is patent with two very diseased profunda branches.  He has a short stump of SFA on the right that then  occludes.  There is no visualized SFA on the left.  We had a very difficult time evaluating targets on the left with mostly collaterals in the left leg.  With a high volume of contrast on delayed imaging we did visualize a posterior tibial in the mid calf on the left.             Procedure:  The patient was identified in the holding area and taken to room 8.  The patient was then placed supine on the table and prepped and draped in the usual sterile fashion.  A time out was called.  Patient received Versed and fentanyl for moderate sedation.  We monitored heart rate, oxygenation, blood pressure and respiratory rate and I was present for all of sedation.  Ultrasound was initially used to evaluate the left common femoral artery.  I knew that there was a more proximal iliac occlusion.  I wanted to put a sheath in the left groin in order to evaluate runoff for target.  I placed a microneedle into the left common femoral artery and then a microwire but this would only advance a couple centimeters and I did not have enough purchase to place the sheath.  I then went to the right groin where he had a femoral pulse.  This was evaluated with ultrasound and was patent.  Micro access needle was placed in the artery with a microwire and a micro sheath.  Advanced a Bentson wire to the infrarenal aorta and then placed a 5 French sheath and then an Omni Flush catheter.  We then got an abdominal aortogram with bilateral lower extremity runoff.  He has significant aortoiliac and infrainguinal disease that made visualizing targets extremely difficult.  Pertinent findings are noted above.  Wires and catheters were removed.  Taken to holding to have the sheath removed  Plan: Patient is being evaluated for complex multilevel revascularization including aortobifemoral bypass and left femoral to tibial bypass.  I am going to admit him for cardiac work-up.  Echocardiogram and stress test tomorrow.  We will further discuss options with the  patient as he has severe multilevel occlusive disease.  Cephus Shelling, MD [facility] [place] Office: [phone]    Cardiac Studies   Echo pending  Nuclear stress test pending  Patient Profile     51 y.o. male with a hx of HTN, tobacco smoking and PVD who is being seen 04/01/2022 for the evaluation of pre op cardiac clearance for aortobifemoral bypass    Assessment & Plan    Preoperative evaluation for risk of MACE Obtain echo and nuclear  stress test today   Untreated hypertension Hypertensive urgency No medications prior to arrival Started coreg 6.25 mg BID BP remains elevated Will start amlodipine   Hyperlipidemia with LDL goal < 70 04/02/2022: Cholesterol 207; HDL 45; LDL Cholesterol 144; Triglycerides 92; VLDL 18 On 40 mg lipitor - new this admission   PAD Critical limb ischemia - left VVS planning for bifemoral bypass next week pending our evaluation   Disposition Will need to establish with PCP     For questions or updates, please contact [facility] Please consult [email or web address] for contact info under        Signed, Marcelino Duster, PA  04/02/2022, 6:58 AM    History and all data above reviewed.  Patient examined.  I agree with the findings as above.  No chest pain or SOB The patient exam reveals COR:RRR  ,  Lungs: Decreased breath sounds  ,  Abd: Positive bowel sounds, no rebound no guarding, Ext No edema  .  All available labs, radiology testing, previous records reviewed. Agree with documented assessment and plan.  Echo with normal LV function.  Lexiscan Myoview without ischemia.  No further work up.  Acceptable risk for planned surgery.  HTN:  Started Adrian Prince Nolan Tuazon  10:07 AM  04/02/2022

## 2022-04-03 NOTE — Progress Notes (Signed)
   Studies as listed.  No high risk findings.  Please call with further questions.

## 2022-04-03 NOTE — Progress Notes (Addendum)
  Progress Note    04/03/2022 7:42 AM 2 Days Post-Op  Subjective:  Feeling pain in his left foot.  Vitals:   04/02/22 2345 04/03/22 0424  BP: 129/77 (!) 156/91  Pulse: (!) 116 100  Resp: 20 20  Temp: 98.1 F (36.7 C) 98.6 F (37 C)  SpO2: 100% 100%    Physical Exam: Lungs:  nonlabored Incisions:  R groin cath site is intact without drainage or hematoma Extremities:  No drainage from L 4th toe currently. Toe is exquisitely tender  CBC    Component Value Date/Time   WBC 8.2 03/09/2022 2310   RBC 5.08 03/09/2022 2310   HGB 16.3 04/01/2022 1130   HCT 48.0 04/01/2022 1130   PLT 268 03/09/2022 2310   MCV 89.2 03/09/2022 2310   MCH 28.1 03/09/2022 2310   MCHC 31.6 03/09/2022 2310   RDW 15.1 03/09/2022 2310   LYMPHSABS 4.3 (H) 03/09/2022 2310   MONOABS 0.5 03/09/2022 2310   EOSABS 0.1 03/09/2022 2310   BASOSABS 0.0 03/09/2022 2310    BMET    Component Value Date/Time   NA 139 04/01/2022 1130   K 4.1 04/01/2022 1130   CL 100 04/01/2022 1130   CO2 27 03/09/2022 2310   GLUCOSE 142 (H) 04/01/2022 1130   BUN 15 04/01/2022 1130   CREATININE 1.10 04/01/2022 1130   CALCIUM 9.7 03/09/2022 2310   GFRNONAA >60 03/09/2022 2310   GFRAA >60 01/12/2020 1421    INR    Component Value Date/Time   INR 0.9 03/09/2022 2310     Intake/Output Summary (Last 24 hours) at 04/03/2022 0742 Last data filed at 04/03/2022 0544 Gross per 24 hour  Intake 522.21 ml  Output 150 ml  Net 372.21 ml     Assessment/Plan:  51 y.o. male is 2 days post op, s/p: aortogram   -Plan is for aortobifemoral bypass with left fem-tib bypass on Wednesday, 8/9. Pending cardiac workup and clearance -Patient is on broad-spectrum antibiotics currently for L 4th toe. Toe has no drainage today, however it is very tender -R groin intact without hematoma   Loel Dubonnet, PA-C [facility] [phone] 04/03/2022 7:42 AM  I have interviewed the patient and examined the patient. I agree  with the findings by the PA.    Cardiology is completing his cardiac work-up.  His stress test yesterday showed no reversible ischemia or infarct.  Left ventricular ejection fraction was 36% which was significantly lower than the echo results.  This may have been an issue with patient movement.   His echo showed a left ventricular ejection fraction of 60 to 65% with normal LV function.   He has significant pain in the left foot and an ulcer on his lateral malleolus and also several toes.  He is at high risk for limb loss.     Cari Caraway, MD

## 2022-04-04 LAB — CREATININE, SERUM
Creatinine, Ser: 1.69 mg/dL — ABNORMAL HIGH (ref 0.61–1.24)
GFR, Estimated: 49 mL/min — ABNORMAL LOW (ref >60)

## 2022-04-04 MED ORDER — VANCOMYCIN HCL 1250 MG/250ML IV SOLN
1250.0000 mg | INTRAVENOUS | Status: DC
Start: 2022-04-05 — End: 2022-04-05

## 2022-04-04 NOTE — Progress Notes (Addendum)
Pharmacy Antibiotic Note  Jeffrey Fitzgerald is a 51 y.o. male admitted on 04/01/2022 with critical limb ischemia, wound with drainage.  Pharmacy has been consulted for Vancomycin / Zosyn dosing.  Scr up to 1.69 today. We will adjust vanc. Avoid using zosyn/vanc combo due to risk for nephotoxicity.    Plan: Change vanc to 1.25g IV q48 starting tomorrow night Zosyn 4 hr infusion  Daily bmet  Height: 5\' 5"  (165.1 cm) Weight: 65.8 kg (145 lb) IBW/kg (Calculated) : 61.5  Temp (24hrs), Avg:98.1 F (36.7 C), Min:97.8 F (36.6 C), Max:98.5 F (36.9 C)  Recent Labs  Lab 04/01/22 1130 04/04/22 1607  CREATININE 1.10 1.69*     Estimated Creatinine Clearance: 45.5 mL/min (A) (by C-G formula based on SCr of 1.69 mg/dL (H)).    No Known Allergies  06/04/22, PharmD, BCIDP, AAHIVP, CPP Infectious Disease Pharmacist 04/04/2022 5:30 PM

## 2022-04-04 NOTE — Progress Notes (Addendum)
  Progress Note    04/04/2022 7:34 AM 3 Days Post-Op  Subjective: He reports severe pain in his left foot.  He would like some coffee this morning    Vitals:   04/04/22 0348 04/04/22 0704  BP: (!) 168/111 (!) 181/100  Pulse: 91   Resp: 18   Temp: 98 F (36.7 C)   SpO2: 100%     Physical Exam: Lungs: Nonlabored Incisions: R groin cath site is intact without drainage or hematoma Extremities:  L fourth toe ulceration is currently dry. L lateral malleolus ulceration is currently dry.   CBC    Component Value Date/Time   WBC 8.2 03/09/2022 2310   RBC 5.08 03/09/2022 2310   HGB 16.3 04/01/2022 1130   HCT 48.0 04/01/2022 1130   PLT 268 03/09/2022 2310   MCV 89.2 03/09/2022 2310   MCH 28.1 03/09/2022 2310   MCHC 31.6 03/09/2022 2310   RDW 15.1 03/09/2022 2310   LYMPHSABS 4.3 (H) 03/09/2022 2310   MONOABS 0.5 03/09/2022 2310   EOSABS 0.1 03/09/2022 2310   BASOSABS 0.0 03/09/2022 2310    BMET    Component Value Date/Time   NA 139 04/01/2022 1130   K 4.1 04/01/2022 1130   CL 100 04/01/2022 1130   CO2 27 03/09/2022 2310   GLUCOSE 142 (H) 04/01/2022 1130   BUN 15 04/01/2022 1130   CREATININE 1.10 04/01/2022 1130   CALCIUM 9.7 03/09/2022 2310   GFRNONAA >60 03/09/2022 2310   GFRAA >60 01/12/2020 1421    INR    Component Value Date/Time   INR 0.9 03/09/2022 2310     Intake/Output Summary (Last 24 hours) at 04/04/2022 0734 Last data filed at 04/04/2022 0200 Gross per 24 hour  Intake 730 ml  Output 725 ml  Net 5 ml     Assessment/Plan:  51 y.o. male is 3 days postop, s/p: Aortogram   -Current cardiac work-up reveals no high risks for surgery.  Plan is for aortobifemoral bypass with left fem-tib bypass on Wednesday, 8/9 -Right groin intact without hematoma   Loel Dubonnet, PA-C [facility] [phone] 04/04/2022 7:34 AM  I have interviewed the patient and examined the patient. I agree with the findings by the PA.  Cardiology has  cleared him for surgery.  I believe he is scheduled for Wednesday for aortofemoral bypass grafting.  Cari Caraway, MD

## 2022-04-04 NOTE — Progress Notes (Signed)
Patient called front desk to inquire if he could leave the unit with his cousin to go outside for "some fresh air."  He was told that he needed to stay on the unit.   This nurse left the room after the patient and his cousin started arguing regarding nurse's response. 15 minutes later, this nurse was at the front desk when patient left the unit on his scooter with his cousin.  He was asked where he was going, he stated that he was going "outside to smoke."  I advised him that the doctor will be consulted regarding his non-compliance.   Security was called due to patient leaving unit to smoke without permission.

## 2022-04-05 LAB — BASIC METABOLIC PANEL
Anion gap: 10 (ref 5–15)
BUN: 20 mg/dL (ref 6–20)
CO2: 22 mmol/L (ref 22–32)
Calcium: 9 mg/dL (ref 8.9–10.3)
Chloride: 104 mmol/L (ref 98–111)
Creatinine, Ser: 1.46 mg/dL — ABNORMAL HIGH (ref 0.61–1.24)
GFR, Estimated: 58 mL/min — ABNORMAL LOW (ref >60)
Glucose, Bld: 90 mg/dL (ref 70–99)
Potassium: 3.8 mmol/L (ref 3.5–5.1)
Sodium: 136 mmol/L (ref 135–145)

## 2022-04-05 MED ORDER — SODIUM CHLORIDE 0.9 % IV SOLN: INTRAVENOUS | Status: DC

## 2022-04-05 MED ORDER — VANCOMYCIN HCL 1250 MG/250ML IV SOLN
1250.0000 mg | INTRAVENOUS | Status: DC
  Administered 2022-04-05: 1250 mg via INTRAVENOUS
  Filled 2022-04-05: qty 250

## 2022-04-05 NOTE — Progress Notes (Signed)
Pharmacy Antibiotic Note  Jeffrey Fitzgerald is a 51 y.o. male admitted on 04/01/2022 with critical limb ischemia and LLE wound with drainage.  Pharmacy has been consulted for Vancomycin / Zosyn dosing.  SCr improved to 1.46.  Will need to adjust Vancomycin dosing.  Plan for Vancomycin 1250mg  IV q24h with estimated AUC 548 (goal 400-600).  Would caution the use of Vancomycin/Zosyn combination given risk for nephotoxicity.   Plan: Change Vancomycin to 1.25g IV q24h starting at 6pm Continues on Zosyn 3.375 gm IV q8h (4 hour infusion). Daily BMET to continue to monitor renal function.  Height: 5\' 5"  (165.1 cm) Weight: 65.8 kg (145 lb) IBW/kg (Calculated) : 61.5  Temp (24hrs), Avg:98 F (36.7 C), Min:97.8 F (36.6 C), Max:98.1 F (36.7 C)  Recent Labs  Lab 04/01/22 1130 04/04/22 1607 04/05/22 0254  CREATININE 1.10 1.69* 1.46*     Estimated Creatinine Clearance: 52.7 mL/min (A) (by C-G formula based on SCr of 1.46 mg/dL (H)).    No Known Allergies Jeffrey Fitzgerald, Pharm.D., BCPS Clinical Pharmacist Clinical phone for 04/05/2022 from 7:30-3:00 is [phone].  **Pharmacist phone directory can be found on [email or web address] listed under [facility].  04/05/2022 10:20 AM

## 2022-04-05 NOTE — Progress Notes (Signed)
Mobility Specialist: Progress Note   04/05/22 1100  Mobility  Activity Ambulated with assistance in hallway  Level of Assistance Minimal assist, patient does 75% or more  Assistive Device Front wheel walker  Distance Ambulated (ft) 70 ft  Activity Response Tolerated well  $Mobility charge 1 Mobility   Post-Mobility: 92 HR, 179/93 BP, 100% SpO2  Pt received in the bed and agreeable to mobility. C/o 5-6/10 LLE pain during session, otherwise asymptomatic. Pt sitting EOB after session with call bell and phone at his side.   [facility] Keiera Strathman Mobility Specialist Mobility Specialist [address] [place]: [phone]

## 2022-04-05 NOTE — Progress Notes (Addendum)
  Progress Note    04/05/2022 7:13 AM 4 Days Post-Op  Subjective:  continues to have left leg pain  Afebrile HR 70's-90's NSR 140's-160's systolic 92% RA  Vitals:   04/04/22 2346 04/05/22 0345  BP: (!) 158/107 (!) 169/92  Pulse: 90 92  Resp: 18 17  Temp: 98 F (36.7 C) 98 F (36.7 C)  SpO2: 98% 99%    Physical Exam: General:  no distress Lungs:  non labored   CBC    Component Value Date/Time   WBC 8.2 03/09/2022 2310   RBC 5.08 03/09/2022 2310   HGB 16.3 04/01/2022 1130   HCT 48.0 04/01/2022 1130   PLT 268 03/09/2022 2310   MCV 89.2 03/09/2022 2310   MCH 28.1 03/09/2022 2310   MCHC 31.6 03/09/2022 2310   RDW 15.1 03/09/2022 2310   LYMPHSABS 4.3 (H) 03/09/2022 2310   MONOABS 0.5 03/09/2022 2310   EOSABS 0.1 03/09/2022 2310   BASOSABS 0.0 03/09/2022 2310    BMET    Component Value Date/Time   NA 136 04/05/2022 0254   K 3.8 04/05/2022 0254   CL 104 04/05/2022 0254   CO2 22 04/05/2022 0254   GLUCOSE 90 04/05/2022 0254   BUN 20 04/05/2022 0254   CREATININE 1.46 (H) 04/05/2022 0254   CALCIUM 9.0 04/05/2022 0254   GFRNONAA 58 (L) 04/05/2022 0254   GFRAA >60 01/12/2020 1421    INR    Component Value Date/Time   INR 0.9 03/09/2022 2310     Intake/Output Summary (Last 24 hours) at 04/05/2022 0713 Last data filed at 04/05/2022 0600 Gross per 24 hour  Intake 674.13 ml  Output 610 ml  Net 64.13 ml     Assessment/Plan:  51 y.o. male is s/p:  Angiogram 04/01/2022 by Dr. Chestine Spore  4 Days Post-Op   -pt with CLI left leg and plan for ABF with left femoral to tibial bypass on 8/9.   -pt had AKI with creatinine up to 1.69 yesterday and improved today to 1.46.  (creatinine on admit was 1.10).  pt on vanc/zosyn, which is being titrated by pharmacy.   May need IVF for hydration.  -DVT prophylaxis:  will d/w Dr. Chestine Spore if ok to start sq heparin -continue pain control    Doreatha Massed, PA-C [facility] [phone] 04/05/2022 7:13 AM   I  have seen and evaluated the patient. I agree with the PA note as documented above.  51 year old male with multilevel occlusive disease with critical limb ischemia with tissue loss in the left leg.  Cardiology has cleared him for aortic surgery after stress test and echo.  I went over plans today for aortobifemoral bypass with a left fem-tib bypass on Wednesday.  He is exceedingly high risk for limb loss.  I discussed with him risk of stroke, MI, bleeding problems including return to the OR, renal failure, infections, even risk of death and prolonged ICU stay.  I discussed even with maximal intervention there is some risk he could lose his leg given the severity of his occlusive disease.  All questions answered.  He wishes to proceed.  We will continue antibiotics until Wednesday given some purulent drainage from the toe.  I will gently hydrate him today as he had a slight bump in his creatinine.  Cephus Shelling, MD [facility] [place] Office: [phone]

## 2022-04-06 ENCOUNTER — Encounter (HOSPITAL_COMMUNITY): Payer: Self-pay | Admitting: Vascular Surgery

## 2022-04-06 ENCOUNTER — Other Ambulatory Visit: Payer: Self-pay

## 2022-04-06 DIAGNOSIS — I10 Essential (primary) hypertension: Secondary | ICD-10-CM

## 2022-04-06 DIAGNOSIS — E7800 Pure hypercholesterolemia, unspecified: Secondary | ICD-10-CM

## 2022-04-06 LAB — BASIC METABOLIC PANEL
Anion gap: 7 (ref 5–15)
BUN: 12 mg/dL (ref 6–20)
CO2: 23 mmol/L (ref 22–32)
Calcium: 9.1 mg/dL (ref 8.9–10.3)
Chloride: 108 mmol/L (ref 98–111)
Creatinine, Ser: 1.08 mg/dL (ref 0.61–1.24)
GFR, Estimated: >60 mL/min (ref >60)
Glucose, Bld: 88 mg/dL (ref 70–99)
Potassium: 3.9 mmol/L (ref 3.5–5.1)
Sodium: 138 mmol/L (ref 135–145)

## 2022-04-06 MED ORDER — CARVEDILOL 12.5 MG PO TABS
12.5000 mg | ORAL_TABLET | Freq: Two times a day (BID) | ORAL | Status: DC
  Administered 2022-04-06 – 2022-04-09 (×5): 12.5 mg via ORAL
  Filled 2022-04-06 (×5): qty 1

## 2022-04-06 MED ORDER — VANCOMYCIN HCL 750 MG/150ML IV SOLN
750.0000 mg | Freq: Two times a day (BID) | INTRAVENOUS | Status: DC
  Administered 2022-04-06 – 2022-04-08 (×4): 750 mg via INTRAVENOUS
  Filled 2022-04-06 (×8): qty 150

## 2022-04-06 MED ORDER — HYDRALAZINE HCL 20 MG/ML IJ SOLN
5.0000 mg | Freq: Four times a day (QID) | INTRAMUSCULAR | Status: DC | PRN
  Administered 2022-04-07 (×2): 5 mg via INTRAVENOUS
  Filled 2022-04-06 (×3): qty 1

## 2022-04-06 NOTE — Plan of Care (Signed)

## 2022-04-06 NOTE — Progress Notes (Addendum)
  Progress Note    04/06/2022 7:36 AM 5 Days Post-Op  Subjective:  sitting on side of bed.  No complaints.  Ready to proceed with surgery.   Afebrile HR 90's-100's NSR 160's-180's systolic   Vitals:   04/06/22 3474 04/06/22 0344  BP: (!) 165/95 (!) 174/90  Pulse: 91 95  Resp: 20 18  Temp:  98.7 F (37.1 C)  SpO2:  98%    Physical Exam: General:  no distress Lungs:  non labored  CBC    Component Value Date/Time   WBC 8.2 03/09/2022 2310   RBC 5.08 03/09/2022 2310   HGB 16.3 04/01/2022 1130   HCT 48.0 04/01/2022 1130   PLT 268 03/09/2022 2310   MCV 89.2 03/09/2022 2310   MCH 28.1 03/09/2022 2310   MCHC 31.6 03/09/2022 2310   RDW 15.1 03/09/2022 2310   LYMPHSABS 4.3 (H) 03/09/2022 2310   MONOABS 0.5 03/09/2022 2310   EOSABS 0.1 03/09/2022 2310   BASOSABS 0.0 03/09/2022 2310    BMET    Component Value Date/Time   NA 138 04/06/2022 0328   K 3.9 04/06/2022 0328   CL 108 04/06/2022 0328   CO2 23 04/06/2022 0328   GLUCOSE 88 04/06/2022 0328   BUN 12 04/06/2022 0328   CREATININE 1.08 04/06/2022 0328   CALCIUM 9.1 04/06/2022 0328   GFRNONAA >60 04/06/2022 0328   GFRAA >60 01/12/2020 1421    INR    Component Value Date/Time   INR 0.9 03/09/2022 2310     Intake/Output Summary (Last 24 hours) at 04/06/2022 0736 Last data filed at 04/06/2022 0353 Gross per 24 hour  Intake 1110.87 ml  Output 900 ml  Net 210.87 ml     Assessment/Plan:  51 y.o. male is s/p:  Angiogram 04/01/2022 by Dr. Chestine Spore  5 Days Post-Op   -pt with CLI of left leg -pt cleared by cardiology for surgery after stress test and echo. -pt with AKI-hydrated with IVF; creatinine is 1.08 this morning. - check labs for tomorrow  -npo after MN/labs in am/consent -hypertensive-was not on antihypertensives at home.  He is on Coreg/Norvasc here in hospital started by cardiology.  Will order prn hydralazine.  Will ask cardiology to re-evaluate for his HTN.   Doreatha Massed, PA-C [facility] [phone] 04/06/2022 7:36 AM  I have seen and evaluated the patient. I agree with the PA note as documented above.  Discussed plan for aortobifemoral bypass with a left fem-tib bypass tomorrow for critical limb ischemia with tissue loss.  He is exceedingly high risk for limb loss given the severity of his occlusive disease.  I again discussed risk of heart attack, stroke, renal insufficiency including renal failure, wound problems bleeding even risk of death and prolonged ICU stay.  He wishes to proceed.  Coreg per cardiology.  He had a negative stress test and preserved echo and has been cleared by cardiology for aortic surgery.  Please keep n.p.o. after midnight.  Gentle hydration.  Cr 1.46 --> 1.08 today.   Cephus Shelling, MD [facility] [place] Office: [phone]

## 2022-04-06 NOTE — Progress Notes (Signed)
Mobility Specialist: Progress Note   04/06/22 1216  Mobility  Activity Ambulated with assistance in hallway  Level of Assistance Minimal assist, patient does 75% or more  Assistive Device Front wheel walker  Distance Ambulated (ft) 100 ft  Activity Response Tolerated well  $Mobility charge 1 Mobility   Pre-Mobility: 98 HR Post-Mobility: 106 HR  Received pt in bed having no complaints and agreeable to mobility. MinA to stand from EOB. Pt was asymptomatic throughout ambulation and returned to room w/o fault. Left sitting EOB w/ call bell in reach and all needs met.  [facility] Sherlon Nied Mobility Specialist Mobility Specialist [address]: [phone]

## 2022-04-06 NOTE — Progress Notes (Signed)
Progress Note  Patient Name: Jeffrey Fitzgerald Date of Encounter: 04/06/2022  [facility] Cardiologist: Rollene Rotunda, MD   Subjective   No CP or dyspnea; complains of foot pain  Inpatient Medications    Scheduled Meds:  amLODipine  5 mg Oral Daily   atorvastatin  40 mg Oral Daily   carvedilol  6.25 mg Oral BID WC   hydrALAZINE  10 mg Intravenous Once   nicotine  14 mg Transdermal Daily   sodium chloride flush  3 mL Intravenous Q12H   Continuous Infusions:  sodium chloride     sodium chloride 50 mL/hr at 04/05/22 2117   piperacillin-tazobactam (ZOSYN)  IV 3.375 g (04/06/22 0257)   vancomycin     PRN Meds: sodium chloride, acetaminophen, hydrALAZINE, morphine injection, ondansetron (ZOFRAN) IV, oxyCODONE-acetaminophen, sodium chloride flush   Vital Signs    Vitals:   04/05/22 2045 04/05/22 2302 04/06/22 0004 04/06/22 0344  BP: (!) 163/88 (!) 184/94 (!) 165/95 (!) 174/90  Pulse: 92 92 91 95  Resp:  18 20 18   Temp: 98.4 F (36.9 C) 98.3 F (36.8 C)  98.7 F (37.1 C)  TempSrc: Oral Oral  Oral  SpO2: 100% 96%  98%  Weight:      Height:        Intake/Output Summary (Last 24 hours) at 04/06/2022 0940 Last data filed at 04/06/2022 0900 Gross per 24 hour  Intake 1110.87 ml  Output 900 ml  Net 210.87 ml      04/01/2022   11:21 AM 03/30/2022    2:49 PM 03/09/2022   10:58 PM  Last 3 Weights  Weight (lbs) 145 lb 145 lb 145 lb  Weight (kg) 65.772 kg 65.772 kg 65.772 kg      Telemetry    Sinus tachycardia - Personally Reviewed   Physical Exam   GEN: No acute distress.   Neck: No JVD Cardiac: RRR, no murmurs, rubs, or gallops.  Respiratory: Clear to auscultation bilaterally. GI: Soft, nontender, non-distended  MS: Trace edema Neuro:  Nonfocal  Psych: Normal affect   Labs     Chemistry Recent Labs  Lab 04/01/22 1130 04/04/22 1607 04/05/22 0254 04/06/22 0328  NA 139  --  136 138  K 4.1  --  3.8 3.9  CL 100  --  104 108  CO2  --   --  22 23   GLUCOSE 142*  --  90 88  BUN 15  --  20 12  CREATININE 1.10 1.69* 1.46* 1.08  CALCIUM  --   --  9.0 9.1  GFRNONAA  --  49* 58* >60  ANIONGAP  --   --  10 7    Lipids  Recent Labs  Lab 04/02/22 0304  CHOL 207*  TRIG 92  HDL 45  LDLCALC 144*  CHOLHDL 4.6    Hematology Recent Labs  Lab 04/01/22 1130  HGB 16.3  HCT 48.0   Thyroid  Recent Labs  Lab 04/02/22 0304  TSH 0.753    Patient Profile     51 y.o. male with past medical history of hypertension, tobacco abuse, peripheral vascular disease recently evaluated preoperatively prior to bifemoral bypass for follow-up of hypertension.  Nuclear study August 2023 showed no ischemia or infarction and ejection fraction 36%.  Echocardiogram August 2023 showed normal LV function, basal inferior hypokinesis, grade 1 diastolic dysfunction.  Assessment & Plan    1 preoperative evaluation prior to peripheral vascular surgery-nuclear study showed no ischemia.  Ejection fraction was reduced but  follow-up echocardiogram showed normal LV function.  Patient may proceed without further cardiac evaluation.  2 peripheral vascular disease-continue statin.  He is for OR tomorrow for bifemoral bypass.  3 hypertension-patient's blood pressure remains elevated.  Increase carvedilol to 12.5 mg twice daily.  We will continue to follow his pressure and advance regimen as needed.  Can continue to advance carvedilol or amlodipine.  4 hyperlipidemia-given vascular disease will increase Lipitor to 80 mg daily.  Needs lipids and liver checked in 8 weeks.  Olga Millers, MD   For questions or updates, please contact [facility] Please consult [email or web address] for contact info under        Signed, Olga Millers, MD  04/06/2022, 9:40 AM

## 2022-04-06 NOTE — Progress Notes (Addendum)
Jeffrey Fitzgerald denies chest pain or shortness of breath. Patient denies having any s/s of Covid in his household.  Patient denies any known exposure to Covid.   Jeffrey Fitzgerald has had HTN in the past, but not at this time.  Jeffrey Fitzgerald does not have a PCP.  After speaking to Jeffrey Fitzgerald, I see that it has that he is on 4E, I called back and confirmed with patient that he is  in the hospital.

## 2022-04-06 NOTE — TOC Initial Note (Signed)
Transition of Care (TOC) - Initial/Assessment Note  Donn Pierini RN, BSN Transitions of Care Unit 4E- RN Case Manager See Treatment Team for direct phone #    Patient Details  Name: Jeffrey Fitzgerald MRN: [id number] Date of Birth: March 22, 1971  Transition of Care [facility]) CM/SW Contact:    Darrold Span, RN Phone Number: 04/06/2022, 12:08 PM  Clinical Narrative:                 Pt awaiting OR for ABFB tomorrow.  CM spoke with pt at bedside to discuss potential transition needs.  Pt lives at home, reports that he will have family that can assist him if needed post discharge and provide transportation home.   Pt has electric scooter at bedside, also reports he has walkers at home. Will assess HH and further DME needs post surgery.   Pt confirmed he does not have insurance, no PCP- will need to be set up with one of the clinics for primary care needs closer to discharge- TOC to follow. Will also follow for potential MATCH needs at discharge. Discussed PCP and MATCH with pt who who agreeable.   We will continue to monitor patient advancement through interdisciplinary progression rounds. If new patient transition needs arise, please place a TOC consult.   Expected Discharge Plan: Home w Home Health Services Barriers to Discharge: Continued Medical Work up   Patient Goals and CMS Choice Patient states their goals for this hospitalization and ongoing recovery are:: have surgery and get better to return home      Expected Discharge Plan and Services Expected Discharge Plan: Home w Home Health Services   Discharge Planning Services: CM Consult   Living arrangements for the past 2 months: Apartment                                      Prior Living Arrangements/Services Living arrangements for the past 2 months: Apartment Lives with:: Self Patient language and need for interpreter reviewed:: Yes Do you feel safe going back to the place where you live?: Yes      Need  for Family Participation in Patient Care: Yes (Comment) Care giver support system in place?: Yes (comment) Current home services: [employer], walkers) Criminal Activity/Legal Involvement Pertinent to Current Situation/Hospitalization: No - Comment as needed  Activities of Daily Living Home Assistive Devices/Equipment: Electric scooter ADL Screening (condition at time of admission) Patient's cognitive ability adequate to safely complete daily activities?: No Is the patient deaf or have difficulty hearing?: No Does the patient have difficulty seeing, even when wearing glasses/contacts?: No Does the patient have difficulty concentrating, remembering, or making decisions?: No Patient able to express need for assistance with ADLs?: Yes Does the patient have difficulty dressing or bathing?: Yes Independently performs ADLs?: Yes (appropriate for developmental age) Does the patient have difficulty walking or climbing stairs?: Yes Weakness of Legs: Both Weakness of Arms/Hands: None  Permission Sought/Granted                  Emotional Assessment Appearance:: Appears stated age Attitude/Demeanor/Rapport: Engaged Affect (typically observed): Accepting, Appropriate, Pleasant Orientation: : Oriented to Self, Oriented to Place, Oriented to  Time, Oriented to Situation Alcohol / Substance Use: Not Applicable Psych Involvement: No (comment)  Admission diagnosis:  Critical limb ischemia of both lower extremities (HCC) [I70.223] Patient Active Problem List   Diagnosis Date Noted   Critical limb ischemia of  both lower extremities (HCC) 04/01/2022   Critical limb ischemia of left lower extremity with ulceration of ankle (HCC) 03/30/2022   Iliac artery occlusion, 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   Hypertension 01/25/2020   Left foot pain 01/25/2020   PCP:  Patient, No Pcp Per Pharmacy:   [id number] - [place] - [address] AT [facility] [address] [place] [address] Phone: [phone] Fax: [phone]  [id number] [place], [place] - [address] AT [facility] & [address] [place] [address] Phone: [phone] Fax: [phone]     Social Determinants of Health (SDOH) Interventions    Readmission Risk Interventions     No data to display

## 2022-04-06 NOTE — Progress Notes (Signed)
Pharmacy Antibiotic Note  Jeffrey Fitzgerald is a 51 y.o. male admitted on 04/01/2022 with critical limb ischemia and LLE wound with drainage.  Pharmacy has been consulted for Vancomycin / Zosyn dosing.  SCr continues to improve, back ot baseline at 1.08.  Will need to adjust Vancomycin dosing.    Plan: Change Vancomycin to 750 IV q12 (estimated AUC 498, SCr 1.08) Continues on Zosyn 3.375 gm IV q8h (4 hour infusion). Daily BMET to continue to monitor renal function.  Height: 5\' 5"  (165.1 cm) Weight: 65.8 kg (145 lb) IBW/kg (Calculated) : 61.5  Temp (24hrs), Avg:98.3 F (36.8 C), Min:97.9 F (36.6 C), Max:98.7 F (37.1 C)  Recent Labs  Lab 04/01/22 1130 04/04/22 1607 04/05/22 0254 04/06/22 0328  CREATININE 1.10 1.69* 1.46* 1.08     Estimated Creatinine Clearance: 71.2 mL/min (by C-G formula based on SCr of 1.08 mg/dL).    No Known Allergies Tron Flythe, Pharm.D., BCPS Clinical Pharmacist Clinical phone for 04/06/2022 from 7:30-3:00 is [phone].  **Pharmacist phone directory can be found on [email or web address] listed under [facility].  04/06/2022 9:01 AM

## 2022-04-07 ENCOUNTER — Inpatient Hospital Stay (HOSPITAL_COMMUNITY): Payer: Medicaid Other | Admitting: Certified Registered Nurse Anesthetist

## 2022-04-07 ENCOUNTER — Other Ambulatory Visit: Payer: Self-pay

## 2022-04-07 ENCOUNTER — Encounter (HOSPITAL_COMMUNITY): Payer: Self-pay

## 2022-04-07 ENCOUNTER — Encounter (HOSPITAL_COMMUNITY)
Admission: RE | Disposition: A | Discharge status: Home Health | Payer: Self-pay | Source: Home / Self Care | Attending: Vascular Surgery

## 2022-04-07 ENCOUNTER — Inpatient Hospital Stay (HOSPITAL_COMMUNITY): Payer: Medicaid Other

## 2022-04-07 ENCOUNTER — Inpatient Hospital Stay (HOSPITAL_COMMUNITY): Payer: Medicaid Other | Admitting: Anesthesiology

## 2022-04-07 ENCOUNTER — Inpatient Hospital Stay (HOSPITAL_COMMUNITY): Admission: RE | Admit: 2022-04-07 | Payer: Self-pay | Source: Home / Self Care | Admitting: Vascular Surgery

## 2022-04-07 DIAGNOSIS — I739 Peripheral vascular disease, unspecified: Secondary | ICD-10-CM | POA: Diagnosis present

## 2022-04-07 DIAGNOSIS — I70262 Atherosclerosis of native arteries of extremities with gangrene, left leg: Secondary | ICD-10-CM

## 2022-04-07 DIAGNOSIS — I10 Essential (primary) hypertension: Secondary | ICD-10-CM

## 2022-04-07 DIAGNOSIS — F17200 Nicotine dependence, unspecified, uncomplicated: Secondary | ICD-10-CM

## 2022-04-07 DIAGNOSIS — I70221 Atherosclerosis of native arteries of extremities with rest pain, right leg: Secondary | ICD-10-CM

## 2022-04-07 DIAGNOSIS — I998 Other disorder of circulatory system: Secondary | ICD-10-CM

## 2022-04-07 DIAGNOSIS — I7409 Other arterial embolism and thrombosis of abdominal aorta: Secondary | ICD-10-CM

## 2022-04-07 DIAGNOSIS — I97638 Postprocedural hematoma of a circulatory system organ or structure following other circulatory system procedure: Secondary | ICD-10-CM

## 2022-04-07 DIAGNOSIS — F17210 Nicotine dependence, cigarettes, uncomplicated: Secondary | ICD-10-CM

## 2022-04-07 HISTORY — DX: Peripheral vascular disease, unspecified: I73.9

## 2022-04-07 HISTORY — PX: FEMORAL-POPLITEAL BYPASS GRAFT: SHX937

## 2022-04-07 HISTORY — PX: THROMBECTOMY FEMORAL ARTERY: SHX6406

## 2022-04-07 HISTORY — PX: AORTA - BILATERAL FEMORAL ARTERY BYPASS GRAFT: SHX1175

## 2022-04-07 HISTORY — PX: PATCH ANGIOPLASTY: SHX6230

## 2022-04-07 LAB — COMPREHENSIVE METABOLIC PANEL
ALT: 26 U/L (ref 0–44)
AST: 39 U/L (ref 15–41)
Albumin: 2.3 g/dL — ABNORMAL LOW (ref 3.5–5.0)
Alkaline Phosphatase: 23 U/L — ABNORMAL LOW (ref 38–126)
Anion gap: 7 (ref 5–15)
BUN: 12 mg/dL (ref 6–20)
CO2: 19 mmol/L — ABNORMAL LOW (ref 22–32)
Calcium: 6.9 mg/dL — ABNORMAL LOW (ref 8.9–10.3)
Chloride: 112 mmol/L — ABNORMAL HIGH (ref 98–111)
Creatinine, Ser: 1.11 mg/dL (ref 0.61–1.24)
GFR, Estimated: >60 mL/min (ref >60)
Glucose, Bld: 201 mg/dL — ABNORMAL HIGH (ref 70–99)
Potassium: 4 mmol/L (ref 3.5–5.1)
Sodium: 138 mmol/L (ref 135–145)
Total Bilirubin: 2 mg/dL — ABNORMAL HIGH (ref 0.3–1.2)
Total Protein: 3.9 g/dL — ABNORMAL LOW (ref 6.5–8.1)

## 2022-04-07 LAB — POCT I-STAT 7, (LYTES, BLD GAS, ICA,H+H)
Acid-base deficit: 3 mmol/L — ABNORMAL HIGH (ref 0.0–2.0)
Acid-base deficit: 2 mmol/L (ref 0.0–2.0)
Acid-base deficit: 5 mmol/L — ABNORMAL HIGH (ref 0.0–2.0)
Acid-base deficit: 11 mmol/L — ABNORMAL HIGH (ref 0.0–2.0)
Acid-base deficit: 2 mmol/L (ref 0.0–2.0)
Bicarbonate: 21.9 mmol/L (ref 20.0–28.0)
Bicarbonate: 23 mmol/L (ref 20.0–28.0)
Bicarbonate: 20.9 mmol/L (ref 20.0–28.0)
Bicarbonate: 14.3 mmol/L — ABNORMAL LOW (ref 20.0–28.0)
Bicarbonate: 23 mmol/L (ref 20.0–28.0)
Calcium, Ion: 1.17 mmol/L (ref 1.15–1.40)
Calcium, Ion: 1.13 mmol/L — ABNORMAL LOW (ref 1.15–1.40)
Calcium, Ion: 1.04 mmol/L — ABNORMAL LOW (ref 1.15–1.40)
Calcium, Ion: 0.84 mmol/L — CL (ref 1.15–1.40)
Calcium, Ion: 1.08 mmol/L — ABNORMAL LOW (ref 1.15–1.40)
HCT: 31 % — ABNORMAL LOW (ref 39.0–52.0)
HCT: 26 % — ABNORMAL LOW (ref 39.0–52.0)
HCT: 28 % — ABNORMAL LOW (ref 39.0–52.0)
HCT: 25 % — ABNORMAL LOW (ref 39.0–52.0)
HCT: 25 % — ABNORMAL LOW (ref 39.0–52.0)
Hemoglobin: 10.5 g/dL — ABNORMAL LOW (ref 13.0–17.0)
Hemoglobin: 8.8 g/dL — ABNORMAL LOW (ref 13.0–17.0)
Hemoglobin: 9.5 g/dL — ABNORMAL LOW (ref 13.0–17.0)
Hemoglobin: 8.5 g/dL — ABNORMAL LOW (ref 13.0–17.0)
Hemoglobin: 8.5 g/dL — ABNORMAL LOW (ref 13.0–17.0)
O2 Saturation: 100 %
O2 Saturation: 100 %
O2 Saturation: 100 %
O2 Saturation: 100 %
O2 Saturation: 100 %
Patient temperature: 35.5
Patient temperature: 35.8
Potassium: 3.3 mmol/L — ABNORMAL LOW (ref 3.5–5.1)
Potassium: 3.5 mmol/L (ref 3.5–5.1)
Potassium: 3.8 mmol/L (ref 3.5–5.1)
Potassium: 2.9 mmol/L — ABNORMAL LOW (ref 3.5–5.1)
Potassium: 3.4 mmol/L — ABNORMAL LOW (ref 3.5–5.1)
Sodium: 138 mmol/L (ref 135–145)
Sodium: 140 mmol/L (ref 135–145)
Sodium: 139 mmol/L (ref 135–145)
Sodium: 146 mmol/L — ABNORMAL HIGH (ref 135–145)
Sodium: 144 mmol/L (ref 135–145)
TCO2: 23 mmol/L (ref 22–32)
TCO2: 24 mmol/L (ref 22–32)
TCO2: 22 mmol/L (ref 22–32)
TCO2: 15 mmol/L — ABNORMAL LOW (ref 22–32)
TCO2: 24 mmol/L (ref 22–32)
pCO2 arterial: 36.8 mmHg (ref 32–48)
pCO2 arterial: 40.6 mmHg (ref 32–48)
pCO2 arterial: 39.9 mmHg (ref 32–48)
pCO2 arterial: 27.4 mmHg — ABNORMAL LOW (ref 32–48)
pCO2 arterial: 35.9 mmHg (ref 32–48)
pH, Arterial: 7.383 (ref 7.35–7.45)
pH, Arterial: 7.36 (ref 7.35–7.45)
pH, Arterial: 7.327 — ABNORMAL LOW (ref 7.35–7.45)
pH, Arterial: 7.319 — ABNORMAL LOW (ref 7.35–7.45)
pH, Arterial: 7.409 (ref 7.35–7.45)
pO2, Arterial: 242 mmHg — ABNORMAL HIGH (ref 83–108)
pO2, Arterial: 289 mmHg — ABNORMAL HIGH (ref 83–108)
pO2, Arterial: 254 mmHg — ABNORMAL HIGH (ref 83–108)
pO2, Arterial: 434 mmHg — ABNORMAL HIGH (ref 83–108)
pO2, Arterial: 218 mmHg — ABNORMAL HIGH (ref 83–108)

## 2022-04-07 LAB — ABO/RH: ABO/RH(D): A POS

## 2022-04-07 LAB — BLOOD GAS, ARTERIAL
Acid-base deficit: 8.6 mmol/L — ABNORMAL HIGH (ref 0.0–2.0)
Bicarbonate: 16.6 mmol/L — ABNORMAL LOW (ref 20.0–28.0)
Drawn by: 44475
O2 Saturation: 99.5 %
Patient temperature: 36.1
pCO2 arterial: 32 mmHg (ref 32–48)
pH, Arterial: 7.32 — ABNORMAL LOW (ref 7.35–7.45)
pO2, Arterial: 97 mmHg (ref 83–108)

## 2022-04-07 LAB — CBC
HCT: 38.8 % — ABNORMAL LOW (ref 39.0–52.0)
HCT: 39.9 % (ref 39.0–52.0)
Hemoglobin: 13.1 g/dL (ref 13.0–17.0)
Hemoglobin: 13.5 g/dL (ref 13.0–17.0)
MCH: 28.7 pg (ref 26.0–34.0)
MCH: 29.9 pg (ref 26.0–34.0)
MCHC: 33.8 g/dL (ref 30.0–36.0)
MCHC: 33.8 g/dL (ref 30.0–36.0)
MCV: 84.9 fL (ref 80.0–100.0)
MCV: 88.3 fL (ref 80.0–100.0)
Platelets: 235 10*3/uL (ref 150–400)
Platelets: UNDETERMINED 10*3/uL (ref 150–400)
RBC: 4.57 MIL/uL (ref 4.22–5.81)
RBC: 4.52 MIL/uL (ref 4.22–5.81)
RDW: 14.8 % (ref 11.5–15.5)
RDW: 14 % (ref 11.5–15.5)
WBC: 6.9 10*3/uL (ref 4.0–10.5)
WBC: 12.8 10*3/uL — ABNORMAL HIGH (ref 4.0–10.5)
nRBC: 0 % (ref 0.0–0.2)
nRBC: 0 % (ref 0.0–0.2)

## 2022-04-07 LAB — PREPARE RBC (CROSSMATCH)

## 2022-04-07 LAB — BASIC METABOLIC PANEL
Anion gap: 9 (ref 5–15)
BUN: 11 mg/dL (ref 6–20)
CO2: 23 mmol/L (ref 22–32)
Calcium: 9.5 mg/dL (ref 8.9–10.3)
Chloride: 105 mmol/L (ref 98–111)
Creatinine, Ser: 1.02 mg/dL (ref 0.61–1.24)
GFR, Estimated: >60 mL/min (ref >60)
Glucose, Bld: 101 mg/dL — ABNORMAL HIGH (ref 70–99)
Potassium: 3.8 mmol/L (ref 3.5–5.1)
Sodium: 137 mmol/L (ref 135–145)

## 2022-04-07 LAB — POCT ACTIVATED CLOTTING TIME
Activated Clotting Time: 251 seconds
Activated Clotting Time: 335 seconds
Activated Clotting Time: 227 seconds
Activated Clotting Time: 239 seconds
Activated Clotting Time: 245 seconds

## 2022-04-07 LAB — GLUCOSE, CAPILLARY: Glucose-Capillary: 143 mg/dL — ABNORMAL HIGH (ref 70–99)

## 2022-04-07 LAB — PROTIME-INR
INR: 1.5 — ABNORMAL HIGH (ref 0.8–1.2)
Prothrombin Time: 18.1 seconds — ABNORMAL HIGH (ref 11.4–15.2)

## 2022-04-07 LAB — MRSA NEXT GEN BY PCR, NASAL: MRSA by PCR Next Gen: NOT DETECTED

## 2022-04-07 LAB — MAGNESIUM: Magnesium: 1.4 mg/dL — ABNORMAL LOW (ref 1.7–2.4)

## 2022-04-07 LAB — APTT: aPTT: 30 seconds (ref 24–36)

## 2022-04-07 SURGERY — CREATION, BYPASS, ARTERIAL, AORTA TO FEMORAL, BILATERAL, USING GRAFT
Anesthesia: General | Site: Leg Lower

## 2022-04-07 SURGERY — ANGIOPLASTY, USING PATCH GRAFT
Anesthesia: General | Site: Groin | Laterality: Right

## 2022-04-07 MED ORDER — DEXMEDETOMIDINE (PRECEDEX) IN NS 20 MCG/5ML (4 MCG/ML) IV SYRINGE
PREFILLED_SYRINGE | INTRAVENOUS | Status: DC | PRN
  Administered 2022-04-07: 12 ug via INTRAVENOUS

## 2022-04-07 MED ORDER — MIDAZOLAM HCL 2 MG/2ML IJ SOLN
INTRAMUSCULAR | Status: AC
  Filled 2022-04-07: qty 2

## 2022-04-07 MED ORDER — PROPOFOL 10 MG/ML IV BOLUS
INTRAVENOUS | Status: AC
  Filled 2022-04-07: qty 20

## 2022-04-07 MED ORDER — SODIUM CHLORIDE 0.9% IV SOLUTION: Freq: Once | INTRAVENOUS | Status: DC

## 2022-04-07 MED ORDER — CHLORHEXIDINE GLUCONATE CLOTH 2 % EX PADS
6.0000 | MEDICATED_PAD | Freq: Once | CUTANEOUS | Status: AC
  Administered 2022-04-07: 6 via TOPICAL

## 2022-04-07 MED ORDER — LABETALOL HCL 5 MG/ML IV SOLN
INTRAVENOUS | Status: AC
  Filled 2022-04-07: qty 4

## 2022-04-07 MED ORDER — GUAIFENESIN-DM 100-10 MG/5ML PO SYRP: 15.0000 mL | ORAL_SOLUTION | ORAL | Status: DC | PRN

## 2022-04-07 MED ORDER — HEPARIN 6000 UNIT IRRIGATION SOLUTION
Status: DC | PRN
  Administered 2022-04-07: 1

## 2022-04-07 MED ORDER — SUGAMMADEX SODIUM 200 MG/2ML IV SOLN
INTRAVENOUS | Status: DC | PRN
  Administered 2022-04-07: 200 mg via INTRAVENOUS

## 2022-04-07 MED ORDER — DOCUSATE SODIUM 100 MG PO CAPS
100.0000 mg | ORAL_CAPSULE | Freq: Every day | ORAL | Status: DC
  Administered 2022-04-09 – 2022-05-04 (×17): 100 mg via ORAL
  Filled 2022-04-07 (×21): qty 1

## 2022-04-07 MED ORDER — CHLORHEXIDINE GLUCONATE CLOTH 2 % EX PADS
6.0000 | MEDICATED_PAD | Freq: Every day | CUTANEOUS | Status: DC
  Administered 2022-04-08 – 2022-04-23 (×13): 6 via TOPICAL

## 2022-04-07 MED ORDER — FENTANYL CITRATE (PF) 100 MCG/2ML IJ SOLN
INTRAMUSCULAR | Status: DC | PRN
  Administered 2022-04-07: 150 ug via INTRAVENOUS
  Administered 2022-04-07: 100 ug via INTRAVENOUS
  Administered 2022-04-07 (×2): 50 ug via INTRAVENOUS

## 2022-04-07 MED ORDER — CALCIUM CHLORIDE 10 % IV SOLN
INTRAVENOUS | Status: DC | PRN
  Administered 2022-04-07 (×4): 200 mg via INTRAVENOUS

## 2022-04-07 MED ORDER — CEFAZOLIN SODIUM-DEXTROSE 2-4 GM/100ML-% IV SOLN: 2.0000 g | INTRAVENOUS | Status: DC

## 2022-04-07 MED ORDER — PHENYLEPHRINE 80 MCG/ML (10ML) SYRINGE FOR IV PUSH (FOR BLOOD PRESSURE SUPPORT)
PREFILLED_SYRINGE | INTRAVENOUS | Status: AC
  Filled 2022-04-07: qty 10

## 2022-04-07 MED ORDER — 0.9 % SODIUM CHLORIDE (POUR BTL) OPTIME
TOPICAL | Status: DC | PRN
  Administered 2022-04-07: 2000 mL

## 2022-04-07 MED ORDER — AMISULPRIDE (ANTIEMETIC) 5 MG/2ML IV SOLN
10.0000 mg | Freq: Once | INTRAVENOUS | Status: DC | PRN
Start: 2022-04-07 — End: 2022-04-07

## 2022-04-07 MED ORDER — PROTAMINE SULFATE 10 MG/ML IV SOLN
INTRAVENOUS | Status: AC
  Filled 2022-04-07: qty 5

## 2022-04-07 MED ORDER — FENTANYL CITRATE (PF) 250 MCG/5ML IJ SOLN
INTRAMUSCULAR | Status: DC | PRN
  Administered 2022-04-07: 50 ug via INTRAVENOUS
  Administered 2022-04-07: 100 ug via INTRAVENOUS
  Administered 2022-04-07 (×4): 50 ug via INTRAVENOUS
  Administered 2022-04-07: 100 ug via INTRAVENOUS
  Administered 2022-04-07: 50 ug via INTRAVENOUS

## 2022-04-07 MED ORDER — HYDRALAZINE HCL 20 MG/ML IJ SOLN
5.0000 mg | INTRAMUSCULAR | Status: AC | PRN
  Administered 2022-04-20 – 2022-04-22 (×2): 5 mg via INTRAVENOUS
  Filled 2022-04-07 (×2): qty 1

## 2022-04-07 MED ORDER — SUCCINYLCHOLINE CHLORIDE 200 MG/10ML IV SOSY
PREFILLED_SYRINGE | INTRAVENOUS | Status: DC | PRN
  Administered 2022-04-07: 140 mg via INTRAVENOUS

## 2022-04-07 MED ORDER — ONDANSETRON HCL 4 MG/2ML IJ SOLN
INTRAMUSCULAR | Status: AC
  Filled 2022-04-07: qty 2

## 2022-04-07 MED ORDER — VASOPRESSIN 20 UNIT/ML IV SOLN
INTRAVENOUS | Status: DC | PRN
  Administered 2022-04-07 (×4): 1 [IU] via INTRAVENOUS

## 2022-04-07 MED ORDER — MEPERIDINE HCL 25 MG/ML IJ SOLN: 6.2500 mg | INTRAMUSCULAR | Status: DC | PRN

## 2022-04-07 MED ORDER — OXYCODONE HCL 5 MG/5ML PO SOLN: 5.0000 mg | Freq: Once | ORAL | Status: DC | PRN

## 2022-04-07 MED ORDER — LABETALOL HCL 5 MG/ML IV SOLN
10.0000 mg | INTRAVENOUS | Status: AC | PRN
  Administered 2022-04-11 – 2022-04-20 (×4): 10 mg via INTRAVENOUS
  Filled 2022-04-07 (×3): qty 4

## 2022-04-07 MED ORDER — VASOPRESSIN 20 UNIT/ML IV SOLN
INTRAVENOUS | Status: AC
  Filled 2022-04-07: qty 1

## 2022-04-07 MED ORDER — PHENYLEPHRINE HCL (PRESSORS) 10 MG/ML IV SOLN
INTRAVENOUS | Status: DC | PRN
  Administered 2022-04-07: 160 ug via INTRAVENOUS
  Administered 2022-04-07: 80 ug via INTRAVENOUS

## 2022-04-07 MED ORDER — SODIUM CHLORIDE 0.9 % IV SOLN: INTRAVENOUS | Status: DC

## 2022-04-07 MED ORDER — HEPARIN 6000 UNIT IRRIGATION SOLUTION
Status: AC
  Filled 2022-04-07: qty 500

## 2022-04-07 MED ORDER — OXYCODONE HCL 5 MG PO TABS: 5.0000 mg | ORAL_TABLET | Freq: Once | ORAL | Status: DC | PRN

## 2022-04-07 MED ORDER — FENTANYL CITRATE (PF) 250 MCG/5ML IJ SOLN
INTRAMUSCULAR | Status: AC
  Filled 2022-04-07: qty 5

## 2022-04-07 MED ORDER — ACETAMINOPHEN 325 MG RE SUPP: 325.0000 mg | RECTAL | Status: DC | PRN

## 2022-04-07 MED ORDER — ROCURONIUM BROMIDE 10 MG/ML (PF) SYRINGE
PREFILLED_SYRINGE | INTRAVENOUS | Status: DC | PRN
  Administered 2022-04-07: 20 mg via INTRAVENOUS
  Administered 2022-04-07: 30 mg via INTRAVENOUS

## 2022-04-07 MED ORDER — LIDOCAINE 2% (20 MG/ML) 5 ML SYRINGE
INTRAMUSCULAR | Status: AC
  Filled 2022-04-07: qty 5

## 2022-04-07 MED ORDER — ROCURONIUM BROMIDE 10 MG/ML (PF) SYRINGE
PREFILLED_SYRINGE | INTRAVENOUS | Status: AC
  Filled 2022-04-07: qty 10

## 2022-04-07 MED ORDER — PHENYLEPHRINE 80 MCG/ML (10ML) SYRINGE FOR IV PUSH (FOR BLOOD PRESSURE SUPPORT)
PREFILLED_SYRINGE | INTRAVENOUS | Status: DC | PRN
  Administered 2022-04-07: 80 ug via INTRAVENOUS
  Administered 2022-04-07: 160 ug via INTRAVENOUS
  Administered 2022-04-07 (×3): 80 ug via INTRAVENOUS
  Administered 2022-04-07: 40 ug via INTRAVENOUS
  Administered 2022-04-07: 160 ug via INTRAVENOUS
  Administered 2022-04-07: 40 ug via INTRAVENOUS
  Administered 2022-04-07 (×2): 80 ug via INTRAVENOUS
  Administered 2022-04-07 (×2): 160 ug via INTRAVENOUS
  Administered 2022-04-07 (×3): 80 ug via INTRAVENOUS

## 2022-04-07 MED ORDER — HYDROMORPHONE HCL 1 MG/ML IJ SOLN
0.2500 mg | INTRAMUSCULAR | Status: DC | PRN
  Administered 2022-04-07 (×2): 0.5 mg via INTRAVENOUS

## 2022-04-07 MED ORDER — ONDANSETRON HCL 4 MG/2ML IJ SOLN
4.0000 mg | Freq: Four times a day (QID) | INTRAMUSCULAR | Status: DC | PRN
Start: 2022-04-07 — End: 2022-05-19

## 2022-04-07 MED ORDER — SODIUM CHLORIDE 0.9 % IV SOLN
10.0000 mL/h | Freq: Once | INTRAVENOUS | Status: AC
  Administered 2022-04-07: 10 mL/h via INTRAVENOUS

## 2022-04-07 MED ORDER — ACETAMINOPHEN 325 MG PO TABS
325.0000 mg | ORAL_TABLET | ORAL | Status: DC | PRN
  Administered 2022-04-17 – 2022-05-11 (×6): 650 mg via ORAL
  Filled 2022-04-07 (×6): qty 2

## 2022-04-07 MED ORDER — DEXAMETHASONE SODIUM PHOSPHATE 10 MG/ML IJ SOLN
INTRAMUSCULAR | Status: DC | PRN
  Administered 2022-04-07: 10 mg via INTRAVENOUS

## 2022-04-07 MED ORDER — SODIUM BICARBONATE 8.4 % IV SOLN
INTRAVENOUS | Status: DC | PRN
  Administered 2022-04-07 (×2): 50 meq via INTRAVENOUS

## 2022-04-07 MED ORDER — PROTAMINE SULFATE 10 MG/ML IV SOLN
INTRAVENOUS | Status: DC | PRN
  Administered 2022-04-07 (×5): 10 mg via INTRAVENOUS

## 2022-04-07 MED ORDER — MAGNESIUM SULFATE 2 GM/50ML IV SOLN
2.0000 g | Freq: Every day | INTRAVENOUS | Status: AC | PRN
  Administered 2022-04-08: 2 g via INTRAVENOUS
  Filled 2022-04-07: qty 50

## 2022-04-07 MED ORDER — HEPARIN SODIUM (PORCINE) 1000 UNIT/ML IJ SOLN
INTRAMUSCULAR | Status: AC
  Filled 2022-04-07: qty 10

## 2022-04-07 MED ORDER — ONDANSETRON HCL 4 MG/2ML IJ SOLN
INTRAMUSCULAR | Status: DC | PRN
  Administered 2022-04-07: 4 mg via INTRAVENOUS

## 2022-04-07 MED ORDER — ESMOLOL HCL 100 MG/10ML IV SOLN
INTRAVENOUS | Status: DC | PRN
  Administered 2022-04-07: 30 mg via INTRAVENOUS

## 2022-04-07 MED ORDER — ASPIRIN 81 MG PO TBEC
81.0000 mg | DELAYED_RELEASE_TABLET | Freq: Every day | ORAL | Status: DC
Start: 2022-04-08 — End: 2022-04-17
  Administered 2022-04-10 – 2022-04-16 (×7): 81 mg via ORAL
  Filled 2022-04-07 (×8): qty 1

## 2022-04-07 MED ORDER — HEPARIN SODIUM (PORCINE) 5000 UNIT/ML IJ SOLN
5000.0000 [IU] | Freq: Three times a day (TID) | INTRAMUSCULAR | Status: DC
  Administered 2022-04-08: 5000 [IU] via SUBCUTANEOUS
  Filled 2022-04-07: qty 1

## 2022-04-07 MED ORDER — HYDROMORPHONE HCL 1 MG/ML IJ SOLN: 0.2500 mg | INTRAMUSCULAR | Status: DC | PRN

## 2022-04-07 MED ORDER — STERILE WATER FOR IRRIGATION IR SOLN
Status: DC | PRN
  Administered 2022-04-07: 1000 mL

## 2022-04-07 MED ORDER — PROPOFOL 10 MG/ML IV BOLUS
INTRAVENOUS | Status: DC | PRN
  Administered 2022-04-07: 150 mg via INTRAVENOUS

## 2022-04-07 MED ORDER — LIDOCAINE 2% (20 MG/ML) 5 ML SYRINGE
INTRAMUSCULAR | Status: DC | PRN
  Administered 2022-04-07: 80 mg via INTRAVENOUS

## 2022-04-07 MED ORDER — DEXAMETHASONE SODIUM PHOSPHATE 10 MG/ML IJ SOLN
INTRAMUSCULAR | Status: AC
  Filled 2022-04-07: qty 1

## 2022-04-07 MED ORDER — HYDROMORPHONE HCL 1 MG/ML IJ SOLN
INTRAMUSCULAR | Status: AC
  Filled 2022-04-07: qty 1

## 2022-04-07 MED ORDER — PHENYLEPHRINE HCL-NACL 20-0.9 MG/250ML-% IV SOLN
INTRAVENOUS | Status: DC | PRN
  Administered 2022-04-07: 25 ug/min via INTRAVENOUS

## 2022-04-07 MED ORDER — ESMOLOL HCL 100 MG/10ML IV SOLN
INTRAVENOUS | Status: AC
  Filled 2022-04-07: qty 10

## 2022-04-07 MED ORDER — HEPARIN SODIUM (PORCINE) 1000 UNIT/ML IJ SOLN
INTRAMUSCULAR | Status: DC | PRN
  Administered 2022-04-07: 3000 [IU] via INTRAVENOUS
  Administered 2022-04-07: 8000 [IU] via INTRAVENOUS
  Administered 2022-04-07: 2000 [IU] via INTRAVENOUS

## 2022-04-07 MED ORDER — HEMOSTATIC AGENTS (NO CHARGE) OPTIME
TOPICAL | Status: DC | PRN
  Administered 2022-04-07: 1 via TOPICAL

## 2022-04-07 MED ORDER — POLYETHYLENE GLYCOL 3350 17 G PO PACK
17.0000 g | PACK | Freq: Every day | ORAL | Status: DC | PRN
  Administered 2022-04-19: 17 g via ORAL
  Filled 2022-04-07 (×2): qty 1

## 2022-04-07 MED ORDER — HEPARIN SODIUM (PORCINE) 1000 UNIT/ML IJ SOLN
INTRAMUSCULAR | Status: DC | PRN
  Administered 2022-04-07: 2000 [IU] via INTRAVENOUS
  Administered 2022-04-07: 6000 [IU] via INTRAVENOUS

## 2022-04-07 MED ORDER — ESMOLOL HCL 100 MG/10ML IV SOLN
INTRAVENOUS | Status: DC | PRN
  Administered 2022-04-07: 20 mg via INTRAVENOUS

## 2022-04-07 MED ORDER — CEFAZOLIN SODIUM-DEXTROSE 2-4 GM/100ML-% IV SOLN
2.0000 g | Freq: Three times a day (TID) | INTRAVENOUS | Status: AC
  Administered 2022-04-07: 2 g via INTRAVENOUS
  Filled 2022-04-07 (×2): qty 100

## 2022-04-07 MED ORDER — PANTOPRAZOLE SODIUM 40 MG PO TBEC
40.0000 mg | DELAYED_RELEASE_TABLET | Freq: Every day | ORAL | Status: DC
  Administered 2022-04-08 – 2022-05-19 (×41): 40 mg via ORAL
  Filled 2022-04-07 (×43): qty 1

## 2022-04-07 MED ORDER — HEMOSTATIC AGENTS (NO CHARGE) OPTIME
TOPICAL | Status: DC | PRN
  Administered 2022-04-07: 3 via TOPICAL

## 2022-04-07 MED ORDER — HEMOSTATIC AGENTS (NO CHARGE) OPTIME
TOPICAL | Status: DC | PRN
  Administered 2022-04-07 (×4): 1

## 2022-04-07 MED ORDER — SODIUM CHLORIDE 0.9 % IV SOLN: 500.0000 mL | Freq: Once | INTRAVENOUS | Status: DC | PRN

## 2022-04-07 MED ORDER — LIDOCAINE 2% (20 MG/ML) 5 ML SYRINGE
INTRAMUSCULAR | Status: DC | PRN
  Administered 2022-04-07: 40 mg via INTRAVENOUS

## 2022-04-07 MED ORDER — POTASSIUM CHLORIDE CRYS ER 20 MEQ PO TBCR
20.0000 meq | EXTENDED_RELEASE_TABLET | Freq: Every day | ORAL | Status: AC | PRN
  Administered 2022-04-14: 40 meq via ORAL
  Filled 2022-04-07: qty 2

## 2022-04-07 MED ORDER — PROMETHAZINE HCL 25 MG/ML IJ SOLN: 6.2500 mg | INTRAMUSCULAR | Status: DC | PRN

## 2022-04-07 MED ORDER — ALUM & MAG HYDROXIDE-SIMETH 200-200-20 MG/5ML PO SUSP: 15.0000 mL | ORAL | Status: DC | PRN

## 2022-04-07 MED ORDER — MIDAZOLAM HCL 2 MG/2ML IJ SOLN
INTRAMUSCULAR | Status: DC | PRN
  Administered 2022-04-07: 2 mg via INTRAVENOUS

## 2022-04-07 MED ORDER — MIDAZOLAM HCL 2 MG/2ML IJ SOLN: 0.5000 mg | Freq: Once | INTRAMUSCULAR | Status: DC | PRN

## 2022-04-07 MED ORDER — ROCURONIUM BROMIDE 10 MG/ML (PF) SYRINGE
PREFILLED_SYRINGE | INTRAVENOUS | Status: DC | PRN
  Administered 2022-04-07: 80 mg via INTRAVENOUS
  Administered 2022-04-07 (×5): 20 mg via INTRAVENOUS

## 2022-04-07 MED ORDER — PROPOFOL 10 MG/ML IV BOLUS
INTRAVENOUS | Status: DC | PRN
  Administered 2022-04-07: 200 mg via INTRAVENOUS

## 2022-04-07 MED ORDER — PROTAMINE SULFATE 10 MG/ML IV SOLN
INTRAVENOUS | Status: DC | PRN
  Administered 2022-04-07: 20 mg via INTRAVENOUS
  Administered 2022-04-07: 10 mg via INTRAVENOUS
  Administered 2022-04-07: 20 mg via INTRAVENOUS

## 2022-04-07 MED ORDER — BISACODYL 10 MG RE SUPP: 10.0000 mg | Freq: Every day | RECTAL | Status: DC | PRN

## 2022-04-07 MED ORDER — LACTATED RINGERS IV SOLN: INTRAVENOUS | Status: DC | PRN

## 2022-04-07 MED ORDER — ALBUMIN HUMAN 5 % IV SOLN: INTRAVENOUS | Status: DC | PRN

## 2022-04-07 MED ORDER — METOPROLOL TARTRATE 5 MG/5ML IV SOLN: 2.0000 mg | INTRAVENOUS | Status: DC | PRN

## 2022-04-07 MED ORDER — PHENOL 1.4 % MT LIQD: 1.0000 | OROMUCOSAL | Status: DC | PRN

## 2022-04-07 MED ORDER — CALCIUM CHLORIDE 10 % IV SOLN
INTRAVENOUS | Status: AC
Start: 2022-04-07 — End: ?
  Filled 2022-04-07: qty 20

## 2022-04-07 MED ORDER — SODIUM CHLORIDE 0.9% IV SOLUTION: Freq: Once | INTRAVENOUS | Status: AC

## 2022-04-07 MED ORDER — LABETALOL HCL 5 MG/ML IV SOLN
10.0000 mg | INTRAVENOUS | Status: DC | PRN
  Administered 2022-04-07: 10 mg via INTRAVENOUS

## 2022-04-07 SURGICAL SUPPLY — 51 items
BAG COUNTER SPONGE SURGICOUNT (BAG) ×3 IMPLANT
CANISTER SUCT 3000ML PPV (MISCELLANEOUS) ×3 IMPLANT
CATH EMB 2FR 60CM (CATHETERS) ×2 IMPLANT
CLIP TI MEDIUM 6 (CLIP) ×6 IMPLANT
CLIP VESOCCLUDE MED 24/CT (CLIP) ×3 IMPLANT
CLIP VESOCCLUDE SM WIDE 24/CT (CLIP) ×3 IMPLANT
COVER BACK TABLE 60X90IN (DRAPES) ×4 IMPLANT
COVER DOME SNAP 22 D (MISCELLANEOUS) ×2 IMPLANT
COVER PROBE W GEL 5X96 (DRAPES) ×7 IMPLANT
DERMABOND ADVANCED (GAUZE/BANDAGES/DRESSINGS) ×3
DERMABOND ADVANCED .7 DNX12 (GAUZE/BANDAGES/DRESSINGS) ×2 IMPLANT
DRAPE HALF SHEET 40X57 (DRAPES) IMPLANT
DRESSING PEEL AND PLC PRVNA 13 (GAUZE/BANDAGES/DRESSINGS) ×1 IMPLANT
DRSG PEEL AND PLACE PREVENA 13 (GAUZE/BANDAGES/DRESSINGS) ×3
ELECT REM PT RETURN 9FT ADLT (ELECTROSURGICAL) ×3
ELECTRODE REM PT RTRN 9FT ADLT (ELECTROSURGICAL) ×2 IMPLANT
GAUZE 4X4 16PLY ~~LOC~~+RFID DBL (SPONGE) ×2 IMPLANT
GLOVE BIO SURGEON STRL SZ7.5 (GLOVE) ×3 IMPLANT
GLOVE BIOGEL PI IND STRL 8 (GLOVE) ×2 IMPLANT
GLOVE BIOGEL PI INDICATOR 8 (GLOVE) ×3
GOWN STRL REUS W/ TWL LRG LVL3 (GOWN DISPOSABLE) ×4 IMPLANT
GOWN STRL REUS W/ TWL XL LVL3 (GOWN DISPOSABLE) ×4 IMPLANT
GOWN STRL REUS W/TWL 2XL LVL3 (GOWN DISPOSABLE) ×2 IMPLANT
GOWN STRL REUS W/TWL LRG LVL3 (GOWN DISPOSABLE) ×6
GOWN STRL REUS W/TWL XL LVL3 (GOWN DISPOSABLE) ×6
GRAFT VASC PATCH XENOSURE 1X14 (Vascular Products) ×2 IMPLANT
HEMOSTAT SNOW SURGICEL 2X4 (HEMOSTASIS) ×8 IMPLANT
KIT BASIN OR (CUSTOM PROCEDURE TRAY) ×3 IMPLANT
KIT DRSG PREVENA PLUS 7DAY 125 (MISCELLANEOUS) ×2 IMPLANT
KIT TURNOVER KIT B (KITS) ×3 IMPLANT
NS IRRIG 1000ML POUR BTL (IV SOLUTION) ×6 IMPLANT
PACK PERIPHERAL VASCULAR (CUSTOM PROCEDURE TRAY) ×3 IMPLANT
PAD ARMBOARD 7.5X6 YLW CONV (MISCELLANEOUS) ×6 IMPLANT
SET MICROPUNCTURE 5F STIFF (MISCELLANEOUS) ×2 IMPLANT
SPONGE T-LAP 18X18 ~~LOC~~+RFID (SPONGE) ×4 IMPLANT
STOPCOCK 4 WAY LG BORE MALE ST (IV SETS) ×3 IMPLANT
SUT MNCRL AB 4-0 PS2 18 (SUTURE) ×4 IMPLANT
SUT PROLENE 5 0 C 1 24 (SUTURE) ×3 IMPLANT
SUT PROLENE 5 0 C 1 36 (SUTURE) ×4 IMPLANT
SUT PROLENE 6 0 BV (SUTURE) ×13 IMPLANT
SUT VIC AB 2-0 CT1 27 (SUTURE) ×3
SUT VIC AB 2-0 CT1 TAPERPNT 27 (SUTURE) ×3 IMPLANT
SUT VIC AB 3-0 SH 27 (SUTURE) ×3
SUT VIC AB 3-0 SH 27X BRD (SUTURE) ×3 IMPLANT
SYR 10ML LL (SYRINGE) ×4 IMPLANT
SYR 30ML SLIP (SYRINGE) ×2 IMPLANT
SYR TB 1ML LUER SLIP (SYRINGE) ×2 IMPLANT
TOWEL GREEN STERILE (TOWEL DISPOSABLE) ×3 IMPLANT
UNDERPAD 30X36 HEAVY ABSORB (UNDERPADS AND DIAPERS) ×3 IMPLANT
WATER STERILE IRR 1000ML POUR (IV SOLUTION) ×3 IMPLANT
WIRE BENTSON .035X145CM (WIRE) ×2 IMPLANT

## 2022-04-07 SURGICAL SUPPLY — 61 items
BAG COUNTER SPONGE SURGICOUNT (BAG) IMPLANT
BAG ISOLATION DRAPE 18X18 (DRAPES) ×6 IMPLANT
CANISTER SUCT 3000ML PPV (MISCELLANEOUS) ×4 IMPLANT
CLIP VESOCCLUDE MED 24/CT (CLIP) ×4 IMPLANT
CLIP VESOCCLUDE SM WIDE 24/CT (CLIP) ×4 IMPLANT
COVER PROBE W GEL 5X96 (DRAPES) ×2 IMPLANT
DERMABOND ADVANCED (GAUZE/BANDAGES/DRESSINGS) ×5
DERMABOND ADVANCED .7 DNX12 (GAUZE/BANDAGES/DRESSINGS) ×9 IMPLANT
DRAPE ISOLATION BAG 18X18 (DRAPES) ×4
ELECT BLADE 4.0 EZ CLEAN MEGAD (MISCELLANEOUS)
ELECT BLADE 6.5 EXT (BLADE) IMPLANT
ELECT REM PT RETURN 9FT ADLT (ELECTROSURGICAL) ×4
ELECTRODE BLDE 4.0 EZ CLN MEGD (MISCELLANEOUS) IMPLANT
ELECTRODE REM PT RTRN 9FT ADLT (ELECTROSURGICAL) ×3 IMPLANT
FELT TEFLON 1X6 (MISCELLANEOUS) ×1 IMPLANT
GAUZE 4X4 16PLY ~~LOC~~+RFID DBL (SPONGE) ×1 IMPLANT
GLOVE BIO SURGEON STRL SZ7.5 (GLOVE) ×4 IMPLANT
GLOVE BIOGEL PI IND STRL 8 (GLOVE) ×3 IMPLANT
GLOVE BIOGEL PI INDICATOR 8 (GLOVE) ×1
GOWN STRL REUS W/ TWL LRG LVL3 (GOWN DISPOSABLE) ×9 IMPLANT
GOWN STRL REUS W/ TWL XL LVL3 (GOWN DISPOSABLE) ×3 IMPLANT
GOWN STRL REUS W/TWL LRG LVL3 (GOWN DISPOSABLE) ×12
GOWN STRL REUS W/TWL XL LVL3 (GOWN DISPOSABLE) ×4
GRAFT HEMASHIELD 16X8MM (Vascular Products) ×1 IMPLANT
GRAFT PROPATEN W/RING 6X80X60 (Vascular Products) ×1 IMPLANT
HEMOSTAT SNOW SURGICEL 2X4 (HEMOSTASIS) ×3 IMPLANT
INSERT FOGARTY 61MM (MISCELLANEOUS) ×4 IMPLANT
INSERT FOGARTY SM (MISCELLANEOUS) ×8 IMPLANT
KIT BASIN OR (CUSTOM PROCEDURE TRAY) ×4 IMPLANT
KIT TURNOVER KIT B (KITS) ×4 IMPLANT
LOOP VESSEL MINI RED (MISCELLANEOUS) ×17 IMPLANT
NS IRRIG 1000ML POUR BTL (IV SOLUTION) ×12 IMPLANT
PACK AORTA (CUSTOM PROCEDURE TRAY) ×4 IMPLANT
PAD ARMBOARD 7.5X6 YLW CONV (MISCELLANEOUS) ×8 IMPLANT
PENCIL BUTTON HOLSTER BLD 10FT (ELECTRODE) ×1 IMPLANT
RETAINER VISCERA MED (MISCELLANEOUS) ×4 IMPLANT
SHEATH PROBE COVER 6X72 (BAG) ×1 IMPLANT
SPONGE T-LAP 18X18 ~~LOC~~+RFID (SPONGE) ×2 IMPLANT
SUT MNCRL AB 4-0 PS2 18 (SUTURE) ×19 IMPLANT
SUT PDS AB 1 TP1 96 (SUTURE) ×8 IMPLANT
SUT PROLENE 3 0 SH DA (SUTURE) ×1 IMPLANT
SUT PROLENE 3 0 SH1 36 (SUTURE) ×9 IMPLANT
SUT PROLENE 5 0 C 1 24 (SUTURE) ×17 IMPLANT
SUT PROLENE 5 0 C 1 36 (SUTURE) ×1 IMPLANT
SUT PROLENE 6 0 BV (SUTURE) ×7 IMPLANT
SUT PROLENE 6 0 C 1 24 (SUTURE) ×1 IMPLANT
SUT PROLENE 6 0 C 1 30 (SUTURE) ×1 IMPLANT
SUT SILK 2 0 (SUTURE) ×4
SUT SILK 2 0 SH CR/8 (SUTURE) ×4 IMPLANT
SUT SILK 2-0 18XBRD TIE 12 (SUTURE) ×3 IMPLANT
SUT SILK 4 0 (SUTURE) ×4
SUT SILK 4-0 18XBRD TIE 12 (SUTURE) ×3 IMPLANT
SUT VIC AB 2-0 CT1 27 (SUTURE) ×16
SUT VIC AB 2-0 CT1 TAPERPNT 27 (SUTURE) ×12 IMPLANT
SUT VIC AB 3-0 SH 27 (SUTURE) ×24
SUT VIC AB 3-0 SH 27X BRD (SUTURE) ×18 IMPLANT
TOWEL GREEN STERILE (TOWEL DISPOSABLE) ×4 IMPLANT
TOWEL ~~LOC~~+RFID 17X26 BLUE (SPONGE) ×5 IMPLANT
TRAY FOLEY MTR SLVR 16FR STAT (SET/KITS/TRAYS/PACK) ×3 IMPLANT
TRAY FOLEY W/BAG SLVR 14FR (SET/KITS/TRAYS/PACK) ×1 IMPLANT
WATER STERILE IRR 1000ML POUR (IV SOLUTION) ×8 IMPLANT

## 2022-04-07 NOTE — Transfer of Care (Signed)
Immediate Anesthesia Transfer of Care Note  Patient: Jeffrey Fitzgerald  Procedure(s) Performed: AORTA BIFEMORAL BYPASS WITH LEFT LEG BYPASS (Bilateral: Abdomen) APPLICATION OF CELL SAVER (Abdomen) LEFT LEG BYPASS GRAFT FEMORAL-POPLITEAL ARTERY (Left: Leg Lower)  Patient Location: PACU  Anesthesia Type:General  Level of Consciousness: awake, alert  and oriented  Airway & Oxygen Therapy: Patient Spontanous Breathing and Patient connected to face mask oxygen  Post-op Assessment: Report given to RN, Post -op Vital signs reviewed and stable and Patient moving all extremities X 4  Post vital signs: Reviewed and stable  Last Vitals:  Vitals Value Taken Time  BP    Temp    Pulse 108 04/07/22 1529  Resp 14 04/07/22 1529  SpO2 100 % 04/07/22 1529  Vitals shown include unvalidated device data.  Last Pain:  Vitals:   04/07/22 0549  TempSrc: Oral  PainSc:       Patients Stated Pain Goal: 0 (04/05/22 2110)  Complications: No notable events documented.

## 2022-04-07 NOTE — Anesthesia Procedure Notes (Signed)
Central Venous Catheter Insertion Performed by: Marcene Duos, MD, anesthesiologist Start/End8/04/2022 7:35 AM, 04/07/2022 7:50 AM Patient location: Pre-op. Preanesthetic checklist: patient identified, IV checked, site marked, risks and benefits discussed, surgical consent, monitors and equipment checked, pre-op evaluation, timeout performed and anesthesia consent Position: Trendelenburg Lidocaine 1% used for infiltration and patient sedated Hand hygiene performed , maximum sterile barriers used  and Seldinger technique used Catheter size: 8.5 Fr Total catheter length 10. Central line was placed.Sheath introducer Swan type:thermodilution Procedure performed using ultrasound guided technique. Ultrasound Notes:anatomy identified, needle tip was noted to be adjacent to the nerve/plexus identified, no ultrasound evidence of intravascular and/or intraneural injection and image(s) printed for medical record Attempts: 1 Following insertion, line sutured, dressing applied and Biopatch. Post procedure assessment: blood return through all ports, free fluid flow and no air  Patient tolerated the procedure well with no immediate complications. Additional procedure comments: Triple lumen slic catheter inserted through introducer port. All ports aspirated and flushed.Marland Kitchen

## 2022-04-07 NOTE — Op Note (Addendum)
Date: April 07, 2022  Preoperative diagnosis: Ischemic right lower extremity after aortobifemoral bypass with right common femoral endarterectomy and profundoplasty  Postoperative diagnosis: Same  Procedure: 1.  Redo exploration of right groin 2.  Right common femoral artery thrombectomy 3.  Extensive endarterectomy of the right profunda with bovine pericardial patch angioplasty 4.  Revision of right limb of the aortobifemoral bypass graft  Surgeon: Dr. Cephus Shelling, MD  Assistant: Ladonna Snide, MD and Loel Dubonnet, [place]  Indications: 51 year old male that has extensive multilevel occlusive disease.  He underwent aortobifemoral bypass with bilateral femoral endarterectomy and profundoplasty earlier today and a left femoral to posterior tibial bypass.  In the PACU, he had right foot pain and numbness.  We could not find doppler signals.  We elected to take him back to the operating room to reexplore the right groin after risks-benefit discussed.  An assistant was needed for exposure and to expedite the case and for complexity.  Findings: There was acute thrombus in the right common femoral artery with thrombus in the two main profunda branches.  There was no backbleeding from either profunda branch.  The common femoral was then thrombectomized.  We then opened the larger profunda branch about 12 to 15 cm and really got no backbleeding and the vessel appears chronically occluded.  The other profunda branch we opened for about 5 cm and found a lumen with backbleeding.  This is a heavily diseased vessel and performed bovine patch and then revised the right limb of the aortobifemoral bypass onto the patent profunda branch.  Anesthesia: General  Details: Patient was taken back to the operating room after informed consent was obtained.  Placed on operative table supine position.  General endotracheal anesthesia was induced.  Antibiotics were updated.  The abdominal wall and right groin and  right leg were prepped and draped in standard sterile fashion.  An incision was made in the right groin and we reopened the previous vertical groin incision.  I did extend this so that we could dissect out more the profunda distally.  There were two main profunda branches that were both dissected out much further distances after mobilization of the SFA.  We gave 100 units/kg IV heparin.  Both profunda branches were examined with Doppler and there was no flow in either branch.  We then clamped the right limb of the aortobifemoral graft with Henle clamp and detached the hood.  There was acute thrombus in the right common femoral artery with no backbleeding from the profunda branches and there was thrombus in both profunda branches as well.  All of this was thrombectomized under direct visualization.  We then used Pott scissors and opened the main profunda branch about 12 to 15 cm and this appeared chronically thrombosed and we could not find any identifiable lumen distally with the help of my assistant.  I then opened the second smaller profunda branch for about 5 cm and we found a lumen with backbleeding with the help of my assistant.  A bovine patch was then placed onto this profundal branch up through the common femoral artery with 5-0 Prolene parachute technique.  We then made a arteriotomy in the patch and then we performed a new anastomose the right limb of the aortobifemoral graft with 5-0 Prolene parachute technique.  This was de-aired prior to completion.  Excellent Doppler signals in the right profunda branch.  The groin was then irrigated out after Surgicel snow was used for hemostasis with protamine for reversal.  It was closed in multiple layers of 2-0 Vicryl, 3-0 Vicryl, 4-0 Monocryl and Dermabond.  Incisional vac was applied to the right groin.    Complication: None  Condition: Stable  Cephus Shelling, MD [facility]  Office: [phone]   Cephus Shelling

## 2022-04-07 NOTE — Brief Op Note (Signed)
DATE OF SERVICE: 04/07/2022  PATIENT:  Jeffrey Fitzgerald  51 y.o. male  PRE-OPERATIVE DIAGNOSIS:  aortoiliac occlusive disease, left lower extremity atherosclerosis causing gangrene  POST-OPERATIVE DIAGNOSIS:  Same  PROCEDURE:   1) aorto-bi-femoral bypass (16x36mm Dacron) 2) bilateral common femoral and profunda femoris endarterectomies 3) harvest left greater saphenous vein 4) left common femoral to posterior tibial artery bypass with composite 79mm ePTFE and reversed greater saphenous vein in anatomic tunnel  COSURGEONS:    Cephus Shelling, MD    * Leonie Douglas, MD  ASSISTANT: Nathanial Rancher, PA-C  An experienced assistant was required given the complexity of this procedure and the standard of surgical care. My assistant helped with exposure through counter tension, suctioning, ligation and retraction to better visualize the surgical field.  My assistant expedited sewing during the case by following my sutures. Wherever I use the term "we" in the report, my assistant actively helped me with that portion of the procedure.  ANESTHESIA:   general  EBL:  BLOOD ADMINISTERED: PRBC  Cell saver  DRAINS: none   LOCAL MEDICATIONS USED:  NONE  SPECIMEN:  none  COUNTS: confirmed correct.  TOURNIQUET:  none  PATIENT DISPOSITION:  PACU - hemodynamically stable.   Delay start of Pharmacological VTE agent (>24hrs) due to surgical blood loss or risk of bleeding: no

## 2022-04-07 NOTE — Anesthesia Preprocedure Evaluation (Signed)
Anesthesia Evaluation  Patient identified by MRN, date of birth, ID band Patient awake    Reviewed: Allergy & Precautions, NPO status , Patient's Chart, lab work & pertinent test results  History of Anesthesia Complications Negative for: history of anesthetic complications  Airway Mallampati: II  TM Distance: >3 FB Neck ROM: Full    Dental  (+) Dental Advisory Given, Missing, Poor Dentition   Pulmonary Current Smoker and Patient abstained from smoking.,    breath sounds clear to auscultation       Cardiovascular hypertension, + Peripheral Vascular Disease (s/p aorto-bifem today)   Rhythm:Regular Rate:Normal  04/02/2022 ECHO: EF 60-65%, LV has normal overall function, basal hypokinesis, mild LVH, Grade 1 DD, normal RVF, no significant valvular abnormalities   Neuro/Psych negative neurological ROS     GI/Hepatic Neg liver ROS, NG tube in place   Endo/Other  negative endocrine ROS  Renal/GU negative Renal ROS     Musculoskeletal   Abdominal   Peds  Hematology Received 3 PRBC, cell saver, 1000 cc Albumin during aorto-bifem today   Anesthesia Other Findings   Reproductive/Obstetrics                             Anesthesia Physical Anesthesia Plan  ASA: 3 and emergent  Anesthesia Plan: General   Post-op Pain Management:    Induction: Intravenous and Rapid sequence  PONV Risk Score and Plan: 1 and Ondansetron, Dexamethasone and Treatment may vary due to age or medical condition  Airway Management Planned: Oral ETT  Additional Equipment: Arterial line  Intra-op Plan:   Post-operative Plan: Possible Post-op intubation/ventilation  Informed Consent: I have reviewed the patients History and Physical, chart, labs and discussed the procedure including the risks, benefits and alternatives for the proposed anesthesia with the patient or authorized representative who has indicated his/her  understanding and acceptance.     Dental advisory given  Plan Discussed with: CRNA and Surgeon  Anesthesia Plan Comments:         Anesthesia Quick Evaluation

## 2022-04-07 NOTE — Progress Notes (Signed)
Patient underwent aortobifemoral bypass with a left fem to posterior tibial bypass today for CLI with tissue loss.  Both of his common femorals required extensive endarterectomy with profundoplasty.  Woke up in recovery with a numb right foot now painful.  He has no signals.  Will proceed back to the OR for likely arteriogram and possible revision of the right limb of the aortobifem and a possible fem distal bypass.  Cephus Shelling, MD [facility] [place] Office: [phone]   Cephus Shelling

## 2022-04-07 NOTE — Plan of Care (Signed)
  Problem: Activity: Goal: Ability to return to baseline activity level will improve Outcome: Progressing   Problem: Cardiovascular: Goal: Ability to achieve and maintain adequate cardiovascular perfusion will improve Outcome: Progressing Goal: Vascular access site(s) Level 0-1 will be maintained Outcome: Progressing   Problem: Health Behavior/Discharge Planning: Goal: Ability to safely manage health-related needs after discharge will improve Outcome: Progressing   

## 2022-04-07 NOTE — Progress Notes (Signed)
[facility] [place]  Subjective  - left foot hurts.   Objective (!) 167/99 (!) 110 98.3 F (36.8 C) (Oral) 17 100%  Intake/Output Summary (Last 24 hours) at 04/07/2022 0806 Last data filed at 04/06/2022 1832 Gross per 24 hour  Intake 720 ml  Output 300 ml  Net 420 ml    Ulcer left lateral ankle and left 4 th toe  Laboratory Lab Results: Recent Labs    04/07/22 0258  WBC 6.9  HGB 13.1  HCT 38.8*  PLT 235   BMET Recent Labs    04/06/22 0328 04/07/22 0258  NA 138 137  K 3.9 3.8  CL 108 105  CO2 23 23  GLUCOSE 88 101*  BUN 12 11  CREATININE 1.08 1.02  CALCIUM 9.1 9.5    COAG Lab Results  Component Value Date   INR 0.9 03/09/2022   No results found for: "PTT"  Assessment/Planning:  Discussed plan for aortobifemoral bypass with a left fem-tib bypass for critical limb ischemia with tissue loss.  He is exceedingly high risk for limb loss given the severity of his occlusive disease.  I again discussed risk of heart attack, stroke, renal insufficiency including renal failure, wound problems, bleeding even risk of death and prolonged ICU stay.  He wishes to proceed.  Coreg per cardiology.  He had a negative stress test and preserved echo and has been cleared by cardiology for aortic surgery.      Jeffrey Fitzgerald 04/07/2022 8:06 AM --

## 2022-04-07 NOTE — Progress Notes (Signed)
Patient left 4E to go to OR. Report given to CRNA.  Jeffrey Fitzgerald

## 2022-04-07 NOTE — Anesthesia Preprocedure Evaluation (Signed)
Anesthesia Evaluation  Patient identified by MRN, date of birth, ID band Patient awake    Reviewed: Allergy & Precautions, NPO status , Patient's Chart, lab work & pertinent test results  Airway Mallampati: II  TM Distance: >3 FB Neck ROM: Full    Dental  (+) Dental Advisory Given   Pulmonary Current Smoker and Patient abstained from smoking.,    breath sounds clear to auscultation       Cardiovascular hypertension, Pt. on medications + Peripheral Vascular Disease   Rhythm:Regular Rate:Normal     Neuro/Psych negative neurological ROS     GI/Hepatic negative GI ROS, Neg liver ROS,   Endo/Other  negative endocrine ROS  Renal/GU negative Renal ROS     Musculoskeletal   Abdominal   Peds  Hematology negative hematology ROS (+)   Anesthesia Other Findings   Reproductive/Obstetrics                             Lab Results  Component Value Date   WBC 6.9 04/07/2022   HGB 13.1 04/07/2022   HCT 38.8 (L) 04/07/2022   MCV 84.9 04/07/2022   PLT 235 04/07/2022   Lab Results  Component Value Date   CREATININE 1.02 04/07/2022   BUN 11 04/07/2022   NA 137 04/07/2022   K 3.8 04/07/2022   CL 105 04/07/2022   CO2 23 04/07/2022    Anesthesia Physical Anesthesia Plan  ASA: 3  Anesthesia Plan: General   Post-op Pain Management: Tylenol PO (pre-op)*   Induction: Intravenous  PONV Risk Score and Plan: 1 and Dexamethasone, Ondansetron and Treatment may vary due to age or medical condition  Airway Management Planned: Oral ETT  Additional Equipment: Arterial line and CVP  Intra-op Plan:   Post-operative Plan: Extubation in OR and Possible Post-op intubation/ventilation  Informed Consent: I have reviewed the patients History and Physical, chart, labs and discussed the procedure including the risks, benefits and alternatives for the proposed anesthesia with the patient or authorized  representative who has indicated his/her understanding and acceptance.     Dental advisory given  Plan Discussed with: CRNA  Anesthesia Plan Comments:         Anesthesia Quick Evaluation

## 2022-04-07 NOTE — Transfer of Care (Signed)
Immediate Anesthesia Transfer of Care Note  Patient: Jeffrey Fitzgerald  Procedure(s) Performed: PATCH ANGIOPLASTY USING XENOSURE BIOLOGIC (Right: Groin) RIGHT FEMORAL ARTERY THROMBECTOMY (Right: Groin)  Patient Location: PACU  Anesthesia Type:General  Level of Consciousness: drowsy  Airway & Oxygen Therapy: Patient Spontanous Breathing and Patient connected to face mask oxygen  Post-op Assessment: Report given to RN, Post -op Vital signs reviewed and stable and Patient moving all extremities  Post vital signs: Reviewed and stable  Last Vitals:  Vitals Value Taken Time  BP 188/105 04/07/22 2030  Temp 37.1 C 04/07/22 2015  Pulse 114 04/07/22 2034  Resp 14 04/07/22 2034  SpO2 98 % 04/07/22 2034  Vitals shown include unvalidated device data.  Last Pain:  Vitals:   04/07/22 1619  TempSrc:   PainSc: 6       Patients Stated Pain Goal: 0 (04/05/22 2110)  Complications: No notable events documented.

## 2022-04-07 NOTE — Anesthesia Procedure Notes (Signed)
Procedure Name: Intubation Date/Time: 04/07/2022 5:27 PM  Performed by: Moshe Salisbury, CRNAPre-anesthesia Checklist: Patient identified, Emergency Drugs available, Suction available and Patient being monitored Patient Re-evaluated:Patient Re-evaluated prior to induction Oxygen Delivery Method: Circle System Utilized Preoxygenation: Pre-oxygenation with 100% oxygen Induction Type: IV induction, Rapid sequence and Cricoid Pressure applied Laryngoscope Size: Mac and 4 Grade View: Grade II Tube type: Oral Tube size: 8.0 mm Number of attempts: 1 Airway Equipment and Method: Stylet Placement Confirmation: ETT inserted through vocal cords under direct vision, positive ETCO2 and breath sounds checked- equal and bilateral Secured at: 22 cm Tube secured with: Tape Dental Injury: Teeth and Oropharynx as per pre-operative assessment

## 2022-04-07 NOTE — Anesthesia Procedure Notes (Signed)
Procedure Name: Intubation Date/Time: 04/07/2022 8:52 AM  Performed by: Marena Chancy, CRNAPre-anesthesia Checklist: Patient identified, Emergency Drugs available, Suction available and Patient being monitored Patient Re-evaluated:Patient Re-evaluated prior to induction Oxygen Delivery Method: Circle System Utilized Preoxygenation: Pre-oxygenation with 100% oxygen Induction Type: IV induction Ventilation: Mask ventilation without difficulty Laryngoscope Size: Miller and 2 Grade View: Grade II Tube type: Oral Tube size: 8.0 mm Number of attempts: 1 Airway Equipment and Method: Stylet and Oral airway Placement Confirmation: ETT inserted through vocal cords under direct vision, positive ETCO2 and breath sounds checked- equal and bilateral Tube secured with: Tape Dental Injury: Teeth and Oropharynx as per pre-operative assessment

## 2022-04-07 NOTE — Op Note (Signed)
DATE OF SERVICE: 04/07/2022  PATIENT:  Jeffrey Fitzgerald  51 y.o. male  PRE-OPERATIVE DIAGNOSIS:  aortoiliac occlusive disease, left lower extremity atherosclerosis causing gangrene  POST-OPERATIVE DIAGNOSIS:  Same  PROCEDURE:   1) aorto-bi-femoral bypass (16x36mm Dacron) 2) bilateral common femoral and profunda femoris endarterectomies 3) harvest left greater saphenous vein 4) left common femoral to posterior tibial artery bypass with composite 68mm ePTFE and reversed greater saphenous vein in anatomic tunnel  COSURGEONS:    Cephus Shelling, MD    * Leonie Douglas, MD  ASSISTANT: Nathanial Rancher, PA-C  An experienced assistant was required given the complexity of this procedure and the standard of surgical care. My assistant helped with exposure through counter tension, suctioning, ligation and retraction to better visualize the surgical field.  My assistant expedited sewing during the case by following my sutures. Wherever I use the term "we" in the report, my assistant actively helped me with that portion of the procedure.  ANESTHESIA:   general  EBL:  BLOOD ADMINISTERED: PRBC  Cell saver  DRAINS: none   LOCAL MEDICATIONS USED:  NONE  SPECIMEN:  none  COUNTS: confirmed correct.  TOURNIQUET:  none  PATIENT DISPOSITION:  PACU - hemodynamically stable.   Delay start of Pharmacological VTE agent (>24hrs) due to surgical blood loss or risk of bleeding: no  INDICATION FOR PROCEDURE: Jeffrey Fitzgerald is a 51 y.o. male with left lower extremity chronic limb threatening ischemia with tissue loss of the forefoot preoperative imaging showed incredibly complex vascular disease requiring multilevel revascularization. After careful discussion of risks, benefits, and alternatives the patient was offered aortobifemoral bypass, left femoral posterior tibial bypass. The patient understood and wished to proceed.  OPERATIVE FINDINGS: 16 x 8 mm aortobifemoral bypass.   Bilateral common femoral endarterectomy and profundoplasty.  Good Doppler flow in both profunda femoris arteries at completion.  Unremarkable common femoral to left posterior tibial artery bypass with composite graft.  Good Doppler signal in the left foot at completion.  No Doppler flow in the right foot at completion.  DESCRIPTION OF PROCEDURE: After identification of the patient in the pre-operative holding area, the patient was transferred to the operating room. The patient was positioned supine on the operating room table. Anesthesia was induced. The abdomen and bilateral lower extremities were prepped and draped in standard fashion. A surgical pause was performed confirming correct patient, procedure, and operative location.  Dr. Chestine Spore began by exposing the right common femoral artery, which she will dictate separately.  I exposed the left common femoral artery through a longitudinal incision in the left groin.  Incision was carried down through subcutaneous tissue until the common femoral artery was identified.  I skeletonized the common femoral artery and its branches including the bifurcation.  Exposure was carried down onto the left profunda femoris artery into his main anterior and posterior division.  The anterior and posterior branches were encircled with Silastic Vesseloops.  Exposure was carried cranially on the common femoral artery onto the external iliac artery under the inguinal ligament.  Circumflex iliac veins were identified and ligated and divided.  Retroperitoneal tunnel was begun with digital dissection.  The left posterior tibial artery was exposed through a longitudinal incision in the left leg in the medial mid calf.  Incision was carried down through subcutaneous tissue until the fascia of the posterior compartments were identified.  The fascia was incised.  The tibial soleal attachments were then taken down.  We identified the posterior tibial artery and  its typical position and  skeletonized several centimeters at the distal target.  Dr. Chestine Spore had made a laparotomy and expose the infrarenal aorta and its retroperitoneal position.  He does like to clamp sites which are soft and was ready to proceed with aortobifemoral bypass.  I assisted him with creation of the proximal anastomosis and tunneled the 2 limbs of the bifurcated graft after achieving hemostasis in the anastomosis.  I performed a left common femoral and profunda femoris endarterectomy using standard technique with a Astronomer.  Clamps were applied to the left external iliac artery, superficial femoral artery, profunda femoris artery divisions and an anterior arteriotomy made on the profunda femoris artery extending onto the common femoral artery.  All visible plaque was removed.  The endarterectomy plane was carefully inspected with loupe magnification all residual debris was removed.  Good backbleeding was achieved from both profunda femoris limbs.  The left limb of the aortobifemoral bypass was then spatulated and anastomosed end to side to the common femoral endarterectomy arteriotomy.  This was done using continuous running suture of 5-0 Prolene.  Immediately prior to completion anastomosis was flushed and de-aired.  The anastomosis was completed.  Hemostasis was achieved in the anastomosis.  The profunda femoris artery was interrogated with Doppler machine.  Good Doppler flow was noted in the profunda femoris artery.  A 6 mm externally supported PTFE vascular graft was delivered in an anatomic tunnel between the left posterior tibial artery exposure and the left common femoral artery exposure.  We harvested a small segment of greater saphenous vein to use as a composite adjunct to allow better size match to the small posterior tibial artery of the calf.  A Cooley clamp was applied to the left limb of the aortobifemoral bypass graft.  An anterior graftotomy was made with 11 blade extended with Potts scissors.   The proximal aspect of the vascular graft was spatulated to allow end-to-side anastomosis to the graftotomy.  This was performed using continuous running suture of 5-0 Prolene.  Immediately prior to completion anastomosis was flushed down the open end of the vascular graft.  Hemostasis was achieved in the anastomosis.  I then assisted Dr. Chestine Spore with anastomosing the distal end of the vascular graft to the reverse segment of greater saphenous vein and then again to the posterior tibial artery in an end-to-side configuration.  At completion there was brisk Doppler flow in the left foot.  We did not note any Doppler flow in the right foot.  Given the indirect nature of the revascularization, we hoped this would improve given time, resuscitation, and improved temperature.  Upon completion of the case instrument and sharps counts were confirmed correct. The patient was transferred to the ICU in good condition. I was present for all portions of the procedure.  Rande Brunt. Lenell Antu, MD [facility] Phone Number: [phone] 04/07/2022 3:10 PM

## 2022-04-07 NOTE — Op Note (Signed)
Date: April 07, 2022  Preoperative diagnosis:  1.  Aortoiliac occlusive disease 2.  Infrainguinal occlusive disease 3.  Critical limb ischemia with tissue loss left lower extremity  Postoperative diagnosis: Same  Procedure: 1.  Aortobifemoral bypass (16 mm x 8 mm bifurcated dacron graft) 2.  Right common femoral endarterectomy with profundoplasty 3.  Left common femoral endarterectomy with profundoplasty 4.  Harvest of left leg great saphenous vein 5.  Left common femoral to posterior tibial bypass with composite PTFE and great saphenous vein  Surgeon: Dr. Cephus Shelling, MD  Co-surgeon: Dr. Heath Lark, MD  Assistant: Nathanial Rancher, PA and Loel Dubonnet, [place]  Indications: 51 year old male seen with multilevel occlusive disease including a left common and external iliac occlusion as well as significant bilateral common femoral disease and significant infrainguinal occlusive disease.  He presented to the office with left lower extremity tissue loss with a ankle wound and also purulent drainage from his left fourth toe with an ABI of 0.  He underwent arteriogram to identify target in the left lower extremity.  He presents today for multilevel revascularization including aortobifemoral bypass and left femoral to PT bypass after risks benefits discussed.  Co-surgeon was required given the complexity of the case.  Findings: Aortobifemoral bypass using a 16 mm x 8 mm bifurcated dacron graft was sewn to the infrarenal abdominal aorta with a felt strip in end to end fashion.  Both limbs were tunneled under the ureter to the bilateral groins.  Bilateral common femoral endarterectomies with profundoplasty's were required.  He has known bilateral SFA occlusions.  Dominant runoff is profunda in bilateral groins.  We had triphasic profunda signals in both groins at completion.  On the left we then harvested a segment of saphenous vein in the left thigh (there was no long segment vein long enough  for autogenous bypass) and a composite bypass was sewn using PTFE and great saphenous vein from the left limb of the aortobifemoral bypass to the posterior tibial artery in the mid calf that was tunneled subfascial subsartorial.  Brisk PT signal in the left foot at completion.  EBL: 3 L  Resuscitation: 700 mL Cell Saver and 3 units PRBCs  Anesthesia: General  Details: Patient was taken to the operating room after informed consent was obtained.  Placed on the operative table in supine position.  General endotracheal anesthesia was induced.  An NG tube was placed.  The abdomen and both groins as well as the left leg were prepped and draped in standard sterile fashion.  Antibiotics were given and timeout performed.  I initially started in the right groin with a vertical groin incision over the common femoral artery and dissected down with Bovie cautery and used cerebellar retractors for added visualization.  The SFA and profunda were dissected out as well as the common femoral artery and distal external iliac artery.  The circumflex vein was ligated and we created a tunnel over the distal external iliac artery for tunneling of the bypass.  While I dissected out the right groin, Dr. Juanetta Gosling was in the left groin dissecting out the common femoral artery using the exact same steps getting out the SFA and profunda as well as the common femoral and distal external iliac artery.  A midline incision was then made in the abdomen from the xiphoid to just above the pubis and divided the midline of the fascia and entered the peritoneal cavity.  We explored all quadrants and did not see any abnormalities.  I eviscerated  the colon cephalad and the small bowel was eviscerated in the right upper quadrant.  All this was wrapped with a wet towel.  Omni retractor was then placed on the field and a fence was used in the right upper quadrant for the small bowel with a splanchnic to retract the transverse colon.  We entered the  retroperitoneum between the duodenum and the IMV.  We then dissected down to the aorta and this was dissected out from the left renal vein all the way down to the aortic bifurcation.  I used clips to control some of the lymphatic branches.  Ultimately I went up and identified both the left and right renal artery and I was able to put umbilical tape below the renal arteries for proximal clamp site on the aorta.  We then tunneled under both ureters to both common femoral arteries in there retroperitoneum and passed umbilical tapes.  We selected a 16 mm x 8 mm bifurcated dacron graft.  Patient was given 100 units/kg IV heparin.  We checked an ACT to ensure it was greater than 250.  While we are doing this, Dr. Lenell Antu dissected out the posterior tibial target in the left calf using a longitudinal incision on the medial calf.  The soleus and gastrocnemius were reflected posteriorly using Meyerding retractor and the posterior tibial was small only measuring about 1 mm but was soft.  Once the patient had a therapeutic ACT, we then clamped just above the IMA with a aortic debakey clamp and below the renals with a Harken C-clamp.  The aorta was divided in the infrarenal segment and oversewed the stump with a 3-0 Prolene running suture.  I then brought the bifurcated graft on the field and the main body was cut short and I used a felt strip and a proximal end to end anastomosis was sewn to the infrarenal aorta using 3-0 Prolene parachute technique.  Once we came off clamp we did have to put a repair stitch on the posterior wall of the aorta with a pledget.  We then tunneled both limbs to the common femoral arteries under the ureter through retroperitoneal tunnels using umbilical tape that had already been tunneled.  Initially started in the right groin where the SFA and profunda were controlled with Vesseloops and the distal external iliac was controlled with a Henley clamp.  We opened the common femoral with 11 blade scalpel  and Potts scissors and the profunda was quite diseased and we elected to perform endarterectomy of the common femoral onto the profunda and had good backbleeding from the profunda.  The graft was pulled to the appropriate length and an end to side anastomosis onto the right common femoral artery 5-0 Prolene parachute technique and the graft was de-aired prior to completion.  We did have to put several pledgeted repair sutures in the right common femoral artery.  We then turned our attention to the left groin where we then tunneled the graft under the ureter through the retroperitoneal tunnel that had been created using umbilical tape.  Again the graft was straightened and cut to the appropriate length and we controlled the left common femoral artery branches with Vesseloops and a Henley clamp and opened the left common femoral artery.  There was significant disease here as correlated on CT scan and extensive endarterectomy was performed and we carried the arteriotomy down to the takeoff of the main profunda branch.  We then sewed the graft end to side to the left common femoral artery  onto the profunda.  We de-aired this prior to completion.  At that point time we had evaluated the vein in the calf and it was quite small and we do not have a long segment of vein for bypass to get to the PT target.  I did open the popliteal fossa below the knee through this incision and then I tunneled from the below-knee popliteal space up to the groin with a large Gore tunneled in the subfascial subsartorial plane.  We then delivered a 6 mm ringed PTFE graft.  Dr. Lenell Antu harvested a segment of saphenous vein from the left thigh and this was passed off the field so we could do a composite bypass given a very small posterior tibial artery target in the calf.  The left limb of the aortobifem was then controlled with a Cooley clamp.  We then opened this with 11 blade scalpel and Potts scissors and an end to side anastomosis with a 6 mm  PTFE was sewn to the left limb of the aortobifemoral graft.  We then pulled the graft to the appropriate length and tunneled this subfascial in the calf down to the PT target below the knee.   I then brought the vein segment on the field and this was spatulated after it was reversed and an end-to-end anastomosis was sewn to the 6 mm ringed PTFE graft below the knee.  We then used bulldog clamps to control the posterior tibial artery in the mid calf where it was patent on arteriogram.  We passed #1, #1.5, and then #2 dilator distally and got some backbleeding the artery was quite small.  We then spatulated the vein and an end to side anastomosis was sewn to the PT target and this was de-aired prior to completion.  We had a brisk PT signal in the left foot.  We did not have any signals in the right foot but the foot was warm and viable.  He only has profunda runoff.  Protamine was given for reversal.  Peritoneal cavity was then irrigated out and we had good hemostasis proximally and added Surgicel snow for hemostasis around the proximal aortic anastomosis.  All the bowel was viable.  There was brisk doppler flow in the IMA.  The retroperitoneum was then irrigated out and closed with a running 3-0 Vicryl.  I put all the bowel back into the peritoneal cavity and the midline fascia was run closed with #1 double-stranded PDS and the skin in the midline incision was closed with 3-0 Vicryl 4-0 Monocryl and Dermabond.  Both groin incisions were irrigated out and closed with multiple layers of 2-0 Vicryl, 3-0 Vicryl, 4-0 Monocryl, and Dermabond.  The vein harvest incisions in the left calf and thigh were closed with 3-0 Vicryl 4-0 Monocryl and Dermabond.  The left calf incision where the distal anastomosis was sewn was closed with 3-0 Vicryl 4-0 Monocryl and Dermabond.  Extubated and taken to recovery in stable condition.    Complication: None  Condition: Stable, ICU post-op  Cephus Shelling, MD [facility] [place] Office: [phone]   Cephus Shelling

## 2022-04-07 NOTE — Anesthesia Procedure Notes (Signed)
Arterial Line Insertion Start/End8/04/2022 8:09 AM, 04/07/2022 8:09 AM Performed by: CRNA  Patient location: Pre-op. Preanesthetic checklist: patient identified, IV checked, site marked, risks and benefits discussed, surgical consent, monitors and equipment checked, pre-op evaluation, timeout performed and anesthesia consent Lidocaine 1% used for infiltration Left, radial was placed Catheter size: 20 G Hand hygiene performed  and maximum sterile barriers used   Attempts: 1 Procedure performed without using ultrasound guided technique. Following insertion, Biopatch and dressing applied. Post procedure assessment: normal  Patient tolerated the procedure well with no immediate complications.

## 2022-04-08 ENCOUNTER — Inpatient Hospital Stay (HOSPITAL_COMMUNITY): Payer: Medicaid Other | Admitting: Anesthesiology

## 2022-04-08 ENCOUNTER — Encounter (HOSPITAL_COMMUNITY)
Admission: RE | Disposition: A | Discharge status: Home Health | Payer: Self-pay | Source: Home / Self Care | Attending: Vascular Surgery

## 2022-04-08 ENCOUNTER — Inpatient Hospital Stay (HOSPITAL_COMMUNITY): Payer: Medicaid Other

## 2022-04-08 ENCOUNTER — Encounter (HOSPITAL_COMMUNITY): Payer: Self-pay | Admitting: Vascular Surgery

## 2022-04-08 DIAGNOSIS — T82868A Thrombosis of vascular prosthetic devices, implants and grafts, initial encounter: Secondary | ICD-10-CM

## 2022-04-08 DIAGNOSIS — I10 Essential (primary) hypertension: Secondary | ICD-10-CM

## 2022-04-08 DIAGNOSIS — I998 Other disorder of circulatory system: Secondary | ICD-10-CM

## 2022-04-08 DIAGNOSIS — F17210 Nicotine dependence, cigarettes, uncomplicated: Secondary | ICD-10-CM

## 2022-04-08 DIAGNOSIS — I97638 Postprocedural hematoma of a circulatory system organ or structure following other circulatory system procedure: Secondary | ICD-10-CM

## 2022-04-08 DIAGNOSIS — I70243 Atherosclerosis of native arteries of left leg with ulceration of ankle: Secondary | ICD-10-CM

## 2022-04-08 HISTORY — PX: APPLICATION OF WOUND VAC: SHX5189

## 2022-04-08 HISTORY — PX: THROMBECTOMY FEMORAL ARTERY: SHX6406

## 2022-04-08 HISTORY — PX: FEMORAL-TIBIAL BYPASS GRAFT: SHX938

## 2022-04-08 HISTORY — PX: LOWER EXTREMITY ANGIOGRAM: SHX5508

## 2022-04-08 HISTORY — PX: THIGH FASCIOTOMY: SHX6718

## 2022-04-08 LAB — COMPREHENSIVE METABOLIC PANEL
ALT: 39 U/L (ref 0–44)
AST: 66 U/L — ABNORMAL HIGH (ref 15–41)
Albumin: 2.7 g/dL — ABNORMAL LOW (ref 3.5–5.0)
Alkaline Phosphatase: 26 U/L — ABNORMAL LOW (ref 38–126)
Anion gap: 8 (ref 5–15)
BUN: 17 mg/dL (ref 6–20)
CO2: 19 mmol/L — ABNORMAL LOW (ref 22–32)
Calcium: 7.8 mg/dL — ABNORMAL LOW (ref 8.9–10.3)
Chloride: 113 mmol/L — ABNORMAL HIGH (ref 98–111)
Creatinine, Ser: 1.51 mg/dL — ABNORMAL HIGH (ref 0.61–1.24)
GFR, Estimated: 56 mL/min — ABNORMAL LOW (ref >60)
Glucose, Bld: 145 mg/dL — ABNORMAL HIGH (ref 70–99)
Potassium: 4 mmol/L (ref 3.5–5.1)
Sodium: 140 mmol/L (ref 135–145)
Total Bilirubin: 2.4 mg/dL — ABNORMAL HIGH (ref 0.3–1.2)
Total Protein: 4.4 g/dL — ABNORMAL LOW (ref 6.5–8.1)

## 2022-04-08 LAB — PREPARE FRESH FROZEN PLASMA: Unit division: 0

## 2022-04-08 LAB — POCT I-STAT 7, (LYTES, BLD GAS, ICA,H+H)
Acid-base deficit: 4 mmol/L — ABNORMAL HIGH (ref 0.0–2.0)
Acid-base deficit: 6 mmol/L — ABNORMAL HIGH (ref 0.0–2.0)
Acid-base deficit: 6 mmol/L — ABNORMAL HIGH (ref 0.0–2.0)
Bicarbonate: 20.2 mmol/L (ref 20.0–28.0)
Bicarbonate: 19.1 mmol/L — ABNORMAL LOW (ref 20.0–28.0)
Bicarbonate: 20.1 mmol/L (ref 20.0–28.0)
Calcium, Ion: 1.01 mmol/L — ABNORMAL LOW (ref 1.15–1.40)
Calcium, Ion: 1.02 mmol/L — ABNORMAL LOW (ref 1.15–1.40)
Calcium, Ion: 0.91 mmol/L — ABNORMAL LOW (ref 1.15–1.40)
HCT: 27 % — ABNORMAL LOW (ref 39.0–52.0)
HCT: 23 % — ABNORMAL LOW (ref 39.0–52.0)
HCT: 24 % — ABNORMAL LOW (ref 39.0–52.0)
Hemoglobin: 9.2 g/dL — ABNORMAL LOW (ref 13.0–17.0)
Hemoglobin: 7.8 g/dL — ABNORMAL LOW (ref 13.0–17.0)
Hemoglobin: 8.2 g/dL — ABNORMAL LOW (ref 13.0–17.0)
O2 Saturation: 99 %
O2 Saturation: 99 %
O2 Saturation: 99 %
Patient temperature: 35.7
Patient temperature: 35.6
Patient temperature: 35.5
Potassium: 3.8 mmol/L (ref 3.5–5.1)
Potassium: 3.6 mmol/L (ref 3.5–5.1)
Potassium: 4.2 mmol/L (ref 3.5–5.1)
Sodium: 142 mmol/L (ref 135–145)
Sodium: 142 mmol/L (ref 135–145)
Sodium: 142 mmol/L (ref 135–145)
TCO2: 21 mmol/L — ABNORMAL LOW (ref 22–32)
TCO2: 20 mmol/L — ABNORMAL LOW (ref 22–32)
TCO2: 21 mmol/L — ABNORMAL LOW (ref 22–32)
pCO2 arterial: 31.4 mmHg — ABNORMAL LOW (ref 32–48)
pCO2 arterial: 33.7 mmHg (ref 32–48)
pCO2 arterial: 38.8 mmHg (ref 32–48)
pH, Arterial: 7.412 (ref 7.35–7.45)
pH, Arterial: 7.354 (ref 7.35–7.45)
pH, Arterial: 7.315 — ABNORMAL LOW (ref 7.35–7.45)
pO2, Arterial: 123 mmHg — ABNORMAL HIGH (ref 83–108)
pO2, Arterial: 157 mmHg — ABNORMAL HIGH (ref 83–108)
pO2, Arterial: 159 mmHg — ABNORMAL HIGH (ref 83–108)

## 2022-04-08 LAB — BPAM FFP
Blood Product Expiration Date: 202308142359
Blood Product Expiration Date: 202308142359
ISSUE DATE / TIME: 202308091442
ISSUE DATE / TIME: 202308091746
Unit Type and Rh: 600
Unit Type and Rh: 6200

## 2022-04-08 LAB — CBC
HCT: 39.5 % (ref 39.0–52.0)
Hemoglobin: 14.2 g/dL (ref 13.0–17.0)
MCH: 30.2 pg (ref 26.0–34.0)
MCHC: 35.9 g/dL (ref 30.0–36.0)
MCV: 84 fL (ref 80.0–100.0)
Platelets: 65 10*3/uL — ABNORMAL LOW (ref 150–400)
RBC: 4.7 MIL/uL (ref 4.22–5.81)
RDW: 14.3 % (ref 11.5–15.5)
WBC: 12.8 10*3/uL — ABNORMAL HIGH (ref 4.0–10.5)
nRBC: 0 % (ref 0.0–0.2)

## 2022-04-08 LAB — AMYLASE: Amylase: 58 U/L (ref 28–100)

## 2022-04-08 LAB — POCT ACTIVATED CLOTTING TIME
Activated Clotting Time: 414 seconds
Activated Clotting Time: 323 seconds
Activated Clotting Time: 257 seconds

## 2022-04-08 LAB — GLUCOSE, CAPILLARY: Glucose-Capillary: 104 mg/dL — ABNORMAL HIGH (ref 70–99)

## 2022-04-08 LAB — MAGNESIUM: Magnesium: 1.3 mg/dL — ABNORMAL LOW (ref 1.7–2.4)

## 2022-04-08 LAB — PREPARE RBC (CROSSMATCH)

## 2022-04-08 SURGERY — CREATION, BYPASS, ARTERIAL, FEMORAL TO TIBIAL, USING GRAFT
Anesthesia: General | Site: Leg Lower | Laterality: Right

## 2022-04-08 MED ORDER — HEPARIN (PORCINE) 25000 UT/250ML-% IV SOLN
500.0000 [IU]/h | INTRAVENOUS | Status: DC
  Administered 2022-04-08 – 2022-04-20 (×9): 500 [IU]/h via INTRAVENOUS
  Filled 2022-04-08 (×9): qty 250

## 2022-04-08 MED ORDER — SUGAMMADEX SODIUM 200 MG/2ML IV SOLN
INTRAVENOUS | Status: DC | PRN
  Administered 2022-04-08: 200 mg via INTRAVENOUS

## 2022-04-08 MED ORDER — ONDANSETRON HCL 4 MG/2ML IJ SOLN
INTRAMUSCULAR | Status: DC | PRN
  Administered 2022-04-08: 4 mg via INTRAVENOUS

## 2022-04-08 MED ORDER — HEMOSTATIC AGENTS (NO CHARGE) OPTIME
TOPICAL | Status: DC | PRN
  Administered 2022-04-08: 1 via TOPICAL
  Administered 2022-04-08: 3 via TOPICAL

## 2022-04-08 MED ORDER — 0.9 % SODIUM CHLORIDE (POUR BTL) OPTIME
TOPICAL | Status: DC | PRN
  Administered 2022-04-08: 1000 mL

## 2022-04-08 MED ORDER — ATORVASTATIN CALCIUM 80 MG PO TABS
80.0000 mg | ORAL_TABLET | Freq: Every day | ORAL | Status: DC
  Administered 2022-04-08 – 2022-05-19 (×41): 80 mg via ORAL
  Filled 2022-04-08 (×42): qty 1

## 2022-04-08 MED ORDER — LIDOCAINE 2% (20 MG/ML) 5 ML SYRINGE
INTRAMUSCULAR | Status: AC
  Filled 2022-04-08: qty 5

## 2022-04-08 MED ORDER — LINEZOLID 600 MG/300ML IV SOLN
600.0000 mg | Freq: Two times a day (BID) | INTRAVENOUS | Status: AC
Start: 2022-04-08 — End: 2022-04-15
  Administered 2022-04-08 – 2022-04-15 (×15): 600 mg via INTRAVENOUS
  Filled 2022-04-08 (×16): qty 300

## 2022-04-08 MED ORDER — HYDROMORPHONE HCL 1 MG/ML IJ SOLN: 0.2500 mg | INTRAMUSCULAR | Status: DC | PRN

## 2022-04-08 MED ORDER — ARTIFICIAL TEARS OPHTHALMIC OINT
TOPICAL_OINTMENT | OPHTHALMIC | Status: AC
  Filled 2022-04-08: qty 3.5

## 2022-04-08 MED ORDER — MIDAZOLAM HCL 2 MG/2ML IJ SOLN
INTRAMUSCULAR | Status: DC | PRN
  Administered 2022-04-08: 2 mg via INTRAVENOUS

## 2022-04-08 MED ORDER — CHLORHEXIDINE GLUCONATE 0.12 % MT SOLN
15.0000 mL | Freq: Once | OROMUCOSAL | Status: AC
  Administered 2022-04-08: 15 mL via OROMUCOSAL

## 2022-04-08 MED ORDER — ACETAMINOPHEN 10 MG/ML IV SOLN
INTRAVENOUS | Status: DC | PRN
  Administered 2022-04-08: 1000 mg via INTRAVENOUS

## 2022-04-08 MED ORDER — PROTAMINE SULFATE 10 MG/ML IV SOLN
INTRAVENOUS | Status: DC | PRN
  Administered 2022-04-08 (×2): 50 mg via INTRAVENOUS

## 2022-04-08 MED ORDER — SODIUM CHLORIDE 0.9 % IV SOLN: INTRAVENOUS | Status: DC | PRN

## 2022-04-08 MED ORDER — HEPARIN SODIUM (PORCINE) 1000 UNIT/ML IJ SOLN
INTRAMUSCULAR | Status: DC | PRN
  Administered 2022-04-08: 7000 [IU] via INTRAVENOUS
  Administered 2022-04-08: 1000 [IU] via INTRAVENOUS

## 2022-04-08 MED ORDER — HEPARIN 6000 UNIT IRRIGATION SOLUTION
Status: AC
  Filled 2022-04-08: qty 500

## 2022-04-08 MED ORDER — ROCURONIUM BROMIDE 10 MG/ML (PF) SYRINGE
PREFILLED_SYRINGE | INTRAVENOUS | Status: DC | PRN
  Administered 2022-04-08: 70 mg via INTRAVENOUS

## 2022-04-08 MED ORDER — HEPARIN 6000 UNIT IRRIGATION SOLUTION
Status: DC | PRN
  Administered 2022-04-08: 1

## 2022-04-08 MED ORDER — LACTATED RINGERS IV SOLN: INTRAVENOUS | Status: DC

## 2022-04-08 MED ORDER — LACTATED RINGERS IV SOLN: INTRAVENOUS | Status: DC | PRN

## 2022-04-08 MED ORDER — SODIUM CHLORIDE 0.9 % IV SOLN: 10.0000 mL/h | Freq: Once | INTRAVENOUS | Status: DC

## 2022-04-08 MED ORDER — ROCURONIUM BROMIDE 10 MG/ML (PF) SYRINGE
PREFILLED_SYRINGE | INTRAVENOUS | Status: AC
  Filled 2022-04-08: qty 10

## 2022-04-08 MED ORDER — PAPAVERINE HCL 30 MG/ML IJ SOLN
INTRAMUSCULAR | Status: AC
  Filled 2022-04-08: qty 2

## 2022-04-08 MED ORDER — PHENYLEPHRINE 80 MCG/ML (10ML) SYRINGE FOR IV PUSH (FOR BLOOD PRESSURE SUPPORT)
PREFILLED_SYRINGE | INTRAVENOUS | Status: DC | PRN
  Administered 2022-04-08: 200 ug via INTRAVENOUS
  Administered 2022-04-08: 160 ug via INTRAVENOUS

## 2022-04-08 MED ORDER — MIDAZOLAM HCL 2 MG/2ML IJ SOLN
INTRAMUSCULAR | Status: AC
  Filled 2022-04-08: qty 2

## 2022-04-08 MED ORDER — SODIUM CHLORIDE (PF) 0.9 % IJ SOLN
INTRAVENOUS | Status: DC | PRN
  Administered 2022-04-08: 20 mL via INTRAMUSCULAR

## 2022-04-08 MED ORDER — ONDANSETRON HCL 4 MG/2ML IJ SOLN
INTRAMUSCULAR | Status: AC
  Filled 2022-04-08: qty 2

## 2022-04-08 MED ORDER — ALBUMIN HUMAN 5 % IV SOLN: INTRAVENOUS | Status: DC | PRN

## 2022-04-08 MED ORDER — PROPOFOL 10 MG/ML IV BOLUS
INTRAVENOUS | Status: DC | PRN
  Administered 2022-04-08: 120 mg via INTRAVENOUS

## 2022-04-08 MED ORDER — ORAL CARE MOUTH RINSE: 15.0000 mL | Freq: Once | OROMUCOSAL | Status: AC

## 2022-04-08 MED ORDER — PHENYLEPHRINE HCL-NACL 20-0.9 MG/250ML-% IV SOLN
INTRAVENOUS | Status: DC | PRN
  Administered 2022-04-08: 50 ug/min via INTRAVENOUS

## 2022-04-08 MED ORDER — LIDOCAINE 2% (20 MG/ML) 5 ML SYRINGE
INTRAMUSCULAR | Status: DC | PRN
  Administered 2022-04-08: 40 mg via INTRAVENOUS

## 2022-04-08 MED ORDER — PROTAMINE SULFATE 10 MG/ML IV SOLN
INTRAVENOUS | Status: AC
  Filled 2022-04-08: qty 5

## 2022-04-08 MED ORDER — MORPHINE SULFATE (PF) 4 MG/ML IV SOLN
4.0000 mg | INTRAVENOUS | Status: DC | PRN
  Administered 2022-04-08 – 2022-04-11 (×5): 4 mg via INTRAVENOUS
  Filled 2022-04-08 (×5): qty 1

## 2022-04-08 MED ORDER — HEPARIN SODIUM (PORCINE) 1000 UNIT/ML IJ SOLN
INTRAMUSCULAR | Status: AC
  Filled 2022-04-08: qty 30

## 2022-04-08 MED ORDER — SUFENTANIL CITRATE 50 MCG/ML IV SOLN
INTRAVENOUS | Status: AC
  Filled 2022-04-08: qty 1

## 2022-04-08 MED ORDER — ACETAMINOPHEN 10 MG/ML IV SOLN
INTRAVENOUS | Status: AC
  Filled 2022-04-08: qty 100

## 2022-04-08 MED ORDER — SURGIFLO WITH THROMBIN (HEMOSTATIC MATRIX KIT) OPTIME
TOPICAL | Status: DC | PRN
  Administered 2022-04-08: 1 via TOPICAL

## 2022-04-08 MED ORDER — SUFENTANIL CITRATE 50 MCG/ML IV SOLN
INTRAVENOUS | Status: DC | PRN
  Administered 2022-04-08 (×4): 10 ug via INTRAVENOUS

## 2022-04-08 SURGICAL SUPPLY — 66 items
BAG COUNTER SPONGE SURGICOUNT (BAG) ×3 IMPLANT
BANDAGE ESMARK 6X9 LF (GAUZE/BANDAGES/DRESSINGS) IMPLANT
BENZOIN TINCTURE PRP APPL 2/3 (GAUZE/BANDAGES/DRESSINGS) ×9 IMPLANT
BNDG ELASTIC 4X5.8 VLCR STR LF (GAUZE/BANDAGES/DRESSINGS) ×1 IMPLANT
BNDG ELASTIC 6X5.8 VLCR STR LF (GAUZE/BANDAGES/DRESSINGS) ×1 IMPLANT
BNDG ESMARK 6X9 LF (GAUZE/BANDAGES/DRESSINGS)
CANISTER SUCT 3000ML PPV (MISCELLANEOUS) ×3 IMPLANT
CANISTER WOUNDNEG PRESSURE 500 (CANNISTER) ×1 IMPLANT
CANNULA VESSEL 3MM 2 BLNT TIP (CANNULA) ×3 IMPLANT
CATH EMB 2FR 60CM (CATHETERS) ×2 IMPLANT
CHLORAPREP W/TINT 26 (MISCELLANEOUS) ×6 IMPLANT
CONNECTOR Y ATS VAC SYSTEM (MISCELLANEOUS) ×1 IMPLANT
CUFF TOURN SGL QUICK 24 (TOURNIQUET CUFF)
CUFF TOURN SGL QUICK 34 (TOURNIQUET CUFF)
CUFF TOURN SGL QUICK 42 (TOURNIQUET CUFF) IMPLANT
CUFF TRNQT CYL 24X4X16.5-23 (TOURNIQUET CUFF) IMPLANT
CUFF TRNQT CYL 34X4.125X (TOURNIQUET CUFF) IMPLANT
DRAIN CHANNEL 15F RND FF W/TCR (WOUND CARE) IMPLANT
DRAPE C-ARM 42X72 X-RAY (DRAPES) IMPLANT
DRAPE HALF SHEET 40X57 (DRAPES) IMPLANT
DRAPE X-RAY CASS 24X20 (DRAPES) IMPLANT
DRSG PAD ABDOMINAL 8X10 ST (GAUZE/BANDAGES/DRESSINGS) ×4 IMPLANT
DRSG VAC ATS LRG SENSATRAC (GAUZE/BANDAGES/DRESSINGS) ×1 IMPLANT
DRSG VAC ATS MED SENSATRAC (GAUZE/BANDAGES/DRESSINGS) ×1 IMPLANT
ELECT REM PT RETURN 9FT ADLT (ELECTROSURGICAL) ×3
ELECTRODE REM PT RTRN 9FT ADLT (ELECTROSURGICAL) ×2 IMPLANT
EVACUATOR SILICONE 100CC (DRAIN) IMPLANT
GAUZE SPONGE 4X4 12PLY STRL (GAUZE/BANDAGES/DRESSINGS) ×5 IMPLANT
GLOVE BIO SURGEON STRL SZ8 (GLOVE) ×9 IMPLANT
GOWN STRL REUS W/ TWL LRG LVL3 (GOWN DISPOSABLE) ×4 IMPLANT
GOWN STRL REUS W/ TWL XL LVL3 (GOWN DISPOSABLE) ×2 IMPLANT
GOWN STRL REUS W/TWL LRG LVL3 (GOWN DISPOSABLE) ×6
GOWN STRL REUS W/TWL XL LVL3 (GOWN DISPOSABLE) ×3
GRAFT PROPATEN W/RING 6X80X60 (Vascular Products) ×1 IMPLANT
HEAD CUTTING VALVULOTOME LEMTR (VASCULAR PRODUCTS) IMPLANT
HEMOSTAT HEMOBLAST BELLOWS (HEMOSTASIS) ×3 IMPLANT
HEMOSTAT SNOW SURGICEL 2X4 (HEMOSTASIS) IMPLANT
KIT BASIN OR (CUSTOM PROCEDURE TRAY) ×3 IMPLANT
KIT TURNOVER KIT B (KITS) ×3 IMPLANT
NS IRRIG 1000ML POUR BTL (IV SOLUTION) ×6 IMPLANT
PACK PERIPHERAL VASCULAR (CUSTOM PROCEDURE TRAY) ×3 IMPLANT
PAD ARMBOARD 7.5X6 YLW CONV (MISCELLANEOUS) ×6 IMPLANT
SET COLLECT BLD 21X3/4 12 (NEEDLE) IMPLANT
SET WALTER ACTIVATION W/DRAPE (SET/KITS/TRAYS/PACK) IMPLANT
STOPCOCK 4 WAY LG BORE MALE ST (IV SETS) IMPLANT
STRIP CLOSURE SKIN 1/2X4 (GAUZE/BANDAGES/DRESSINGS) ×9 IMPLANT
SURGIFLO W/THROMBIN 8M KIT (HEMOSTASIS) ×1 IMPLANT
SUT ETHILON 2 0 PSLX (SUTURE) ×4 IMPLANT
SUT ETHILON 3 0 PS 1 (SUTURE) IMPLANT
SUT MNCRL AB 4-0 PS2 18 (SUTURE) ×6 IMPLANT
SUT PROLENE 5 0 C 1 24 (SUTURE) ×3 IMPLANT
SUT PROLENE 6 0 BV (SUTURE) ×9 IMPLANT
SUT SILK 2 0 SH (SUTURE) ×3 IMPLANT
SUT SILK 3 0 (SUTURE)
SUT SILK 3-0 18XBRD TIE 12 (SUTURE) IMPLANT
SUT VIC AB 2-0 CT1 27 (SUTURE) ×9
SUT VIC AB 2-0 CT1 TAPERPNT 27 (SUTURE) ×4 IMPLANT
SUT VIC AB 3-0 SH 27 (SUTURE) ×6
SUT VIC AB 3-0 SH 27X BRD (SUTURE) ×4 IMPLANT
TAPE UMBILICAL COTTON 1/8X30 (MISCELLANEOUS) ×3 IMPLANT
TOWEL GREEN STERILE (TOWEL DISPOSABLE) ×3 IMPLANT
TRAY FOLEY MTR SLVR 16FR STAT (SET/KITS/TRAYS/PACK) ×3 IMPLANT
UNDERPAD 30X36 HEAVY ABSORB (UNDERPADS AND DIAPERS) ×3 IMPLANT
VALVULOTOME HEAD CUTTING LEMTR (VASCULAR PRODUCTS) IMPLANT
VALVULOTOME LEMAITRE (VASCULAR PRODUCTS)
WATER STERILE IRR 1000ML POUR (IV SOLUTION) ×3 IMPLANT

## 2022-04-08 NOTE — Anesthesia Procedure Notes (Signed)
Procedure Name: Intubation Date/Time: 04/08/2022 12:02 PM  Performed by: Barrington Ellison, CRNAPre-anesthesia Checklist: Patient identified, Emergency Drugs available, Suction available and Patient being monitored Patient Re-evaluated:Patient Re-evaluated prior to induction Oxygen Delivery Method: Circle System Utilized Preoxygenation: Pre-oxygenation with 100% oxygen Induction Type: IV induction Ventilation: Mask ventilation without difficulty Laryngoscope Size: Mac and 4 Grade View: Grade I Tube type: Oral Tube size: 7.5 mm Number of attempts: 1 Airway Equipment and Method: Stylet and Oral airway Placement Confirmation: ETT inserted through vocal cords under direct vision, positive ETCO2 and breath sounds checked- equal and bilateral Secured at: 22 cm Tube secured with: Tape Dental Injury: Teeth and Oropharynx as per pre-operative assessment

## 2022-04-08 NOTE — Evaluation (Signed)
Physical Therapy Evaluation Patient Details Name: Jeffrey Fitzgerald MRN: [id number] DOB: 11-Jun-1971 Today's Date: 04/08/2022  History of Present Illness  51 yo male admitted 8/3 from cath lab after aortogram for LLE ischemia. 8/9 aortobifem BPG with bil fem endarterectomy. Post op RLE ischemia with return to OR same date for redo right groin exploration with thrombectomy and angioplasty. 8/10 planned RLE fem-pop BPG. PMhx: HTN  Clinical Impression  Pt pleasant and reports progressive decline in function and gait for 6 months with pt only walking limited distance to get to his scooter or in bathroom at home and otherwise is seated at scooter or chair throughout the day. Pt states he does stand to shower and performs BADLs on his own. Pt lives with mom who he reports can assist. Pt with significant weakness requiring assist with all transfers and unable to weight shift in standing. Pt will benefit from acute therapy to maximize mobility, safety and function to decrease burden of care. Anticipate pt may need SNf if unable to progress to gait.   HR 113 99% RA 136/78        Recommendations for follow up therapy are one component of a multi-disciplinary discharge planning process, led by the attending physician.  Recommendations may be updated based on patient status, additional functional criteria and insurance authorization.  Follow Up Recommendations Skilled nursing-short term rehab (<3 hours/day) Can patient physically be transported by private vehicle: No    Assistance Recommended at Discharge Frequent or constant Supervision/Assistance  Patient can return home with the following  A lot of help with walking and/or transfers;A lot of help with bathing/dressing/bathroom;Assist for transportation;Direct supervision/assist for financial management;Assistance with cooking/housework;Help with stairs or ramp for entrance;Direct supervision/assist for medications management    Equipment  Recommendations Rolling walker (2 wheels);BSC/3in1  Recommendations for Other Services       Functional Status Assessment Patient has had a recent decline in their functional status and demonstrates the ability to make significant improvements in function in a reasonable and predictable amount of time.     Precautions / Restrictions Precautions Precautions: Fall Precaution Comments: flo trac, Rt groin VAC      Mobility  Bed Mobility Overal bed mobility: Needs Assistance Bed Mobility: Sit to Supine, Supine to Sit     Supine to sit: Min assist Sit to supine: Min assist   General bed mobility comments: assist to move RLE, pivot hips and elevate trunk from surface with rise to sitting. Return to bed with assist to clear bil LE. Total +2 to slide toward HOB, mod assist to scoot hips in bed    Transfers Overall transfer level: Needs assistance   Transfers: Sit to/from Stand Sit to Stand: Min assist           General transfer comment: min assist to stand from bed x 2 with assist to rise and RW present. pt reliant on bil UE support. Incontinent stool upon standing with pt unaware and assist for 2nd stand for pericare prior to return to supine. Stood grossly 1 min max. Pt unable to weight shift or significantly step with LLE in standing    Ambulation/Gait               General Gait Details: unable  Stairs            Wheelchair Mobility    Modified Rankin (Stroke Patients Only)       Balance Overall balance assessment: Needs assistance   Sitting balance-Leahy Scale: Fair Sitting balance - Comments:  EOB with guarding   Standing balance support: Bilateral upper extremity supported, Reliant on assistive device for balance Standing balance-Leahy Scale: Poor Standing balance comment: bil UE support on RW                             Pertinent Vitals/Pain Pain Assessment Pain Assessment: Faces Pain Score: 7  Pain Location: LLE with touch Pain  Descriptors / Indicators: Guarding Pain Intervention(s): Limited activity within patient's tolerance, Repositioned, Monitored during session    Home Living Family/patient expects to be discharged to:: Private residence Living Arrangements: Parent Available Help at Discharge: Family;Available 24 hours/day Type of Home: House Home Access: Level entry       Home Layout: One level Home Equipment: Cane - single point;Electric scooter      Prior Function Prior Level of Function : Independent/Modified Independent             Mobility Comments: uses a scooter for all mobility other than walking in bathroom holding wall ADLs Comments: stands to shower, doesn't drive     Hand Dominance        Extremity/Trunk Assessment        Lower Extremity Assessment Lower Extremity Assessment: RLE deficits/detail;LLE deficits/detail RLE Deficits / Details: limited by pain grossly 40 degrees knee flexion, decreased strength with hip and knee flexion, unable to formally assess LLE Deficits / Details: grossly 50 degrees active knee and hip flexion, limited by pain and unable to fully assess, decreased strength    Cervical / Trunk Assessment Cervical / Trunk Assessment: Normal  Communication   Communication: No difficulties  Cognition Arousal/Alertness: Awake/alert Behavior During Therapy: WFL for tasks assessed/performed Overall Cognitive Status: Within Functional Limits for tasks assessed                                          General Comments      Exercises     Assessment/Plan    PT Assessment Patient needs continued PT services  PT Problem List Decreased strength;Decreased mobility;Decreased safety awareness;Decreased range of motion;Decreased coordination;Decreased activity tolerance;Decreased balance;Decreased knowledge of use of DME;Pain       PT Treatment Interventions Gait training;Balance training;DME instruction;Therapeutic exercise;Therapeutic  activities;Functional mobility training;Patient/family education;Stair training    PT Goals (Current goals can be found in the Care Plan section)  Acute Rehab PT Goals Patient Stated Goal: be able to walk PT Goal Formulation: With patient Time For Goal Achievement: 04/22/22 Potential to Achieve Goals: Fair    Frequency Min 3X/week     Co-evaluation               AM-PAC PT "6 Clicks" Mobility  Outcome Measure Help needed turning from your back to your side while in a flat bed without using bedrails?: A Little Help needed moving from lying on your back to sitting on the side of a flat bed without using bedrails?: A Little Help needed moving to and from a bed to a chair (including a wheelchair)?: A Lot Help needed standing up from a chair using your arms (e.g., wheelchair or bedside chair)?: A Little Help needed to walk in hospital room?: Total Help needed climbing 3-5 steps with a railing? : Total 6 Click Score: 13    End of Session   Activity Tolerance: Patient tolerated treatment well Patient left: in bed;with call bell/phone within reach;with  nursing/sitter in room Nurse Communication: Mobility status PT Visit Diagnosis: Other abnormalities of gait and mobility (R26.89);Difficulty in walking, not elsewhere classified (R26.2);Muscle weakness (generalized) (M62.81)    Time: 6314-9702 PT Time Calculation (min) (ACUTE ONLY): 25 min   Charges:   PT Evaluation $PT Eval Moderate Complexity: 1 Mod PT Treatments $Therapeutic Activity: 8-22 mins        Merryl Hacker, PT Acute Rehabilitation Services Office: [phone]   Enedina Finner Chandel Zaun 04/08/2022, 11:25 AM

## 2022-04-08 NOTE — Progress Notes (Signed)
Pharmacy Antibiotic Note  Jeffrey Fitzgerald is a 51 y.o. male admitted on 04/01/2022 with critical limb ischemia and LLE wound with drainage.  Pharmacy has been consulted for Vancomycin / Zosyn dosing.  Scr 1.51 yesterday. D/w Dr. Lenell Antu and we will change vanc to linezolid, especially, when it's given with Zosyn.   Plan: Dc vanc Zyvox 600mg  IV q12 Continues on Zosyn 3.375 gm IV q8h (4 hour infusion). Daily BMET to continue to monitor renal function.  Height: 5\' 5"  (165.1 cm) Weight: 65.8 kg (145 lb) IBW/kg (Calculated) : 61.5  Temp (24hrs), Avg:98.2 F (36.8 C), Min:97.7 F (36.5 C), Max:99 F (37.2 C)  Recent Labs  Lab 04/05/22 0254 04/06/22 0328 04/07/22 0258 04/07/22 1540 04/07/22 2350  WBC  --   --  6.9 12.8* 12.8*  CREATININE 1.46* 1.08 1.02 1.11 1.51*     Estimated Creatinine Clearance: 50.9 mL/min (A) (by C-G formula based on SCr of 1.51 mg/dL (H)).    No Known Allergies  Vancomycin 8/4>8/10 Zosyn 8/4> Zyvox 8/10>  06/07/22, PharmD, [place], AAHIVP, CPP Infectious Disease Pharmacist 04/08/2022 4:24 PM

## 2022-04-08 NOTE — Progress Notes (Signed)
OT Cancellation Note  Patient Details Name: Jeffrey Fitzgerald MRN: [id number] DOB: [birth year]   Cancelled Treatment:    Reason Eval/Treat Not Completed: Patient not medically ready.  Patient with scheduled procedure this date, will hold and continue efforts as appropriate after surgery.    Minnah Llamas D Marysa Wessner 04/08/2022, 9:58 AM 04/08/2022  RP, OTR/L  Acute Rehabilitation Services  Office:  [phone]

## 2022-04-08 NOTE — Progress Notes (Addendum)
Progress Note    04/08/2022 7:44 AM 1 Day Post-Op  Subjective:  asking about his bike. Also asking for coffee. Says pain is 2/10 after Dr. Myra Gianotti came in and adjusted pain regimen. Not passing flatus.   Vitals:   04/08/22 0400 04/08/22 0430  BP:    Pulse: (!) 118 (!) 110  Resp: 12 18  Temp: 97.9 F (36.6 C)   SpO2: 99% 99%   Physical Exam: Cardiac: tachy Lungs:  non labored Incisions:  laparotomy incision, b groin incision, and left leg incisions are intact and well appearing Extremities:  Right foot is cold with loss of motor function. No sensation. No doppler signal in right foot. Left foot warm. Left foot edematous. Left PT signal and AT signal monophasic. Left foot edematous  Abdomen:  soft, expected tenderness  Neurologic: alert and oriented  CBC    Component Value Date/Time   WBC 12.8 (H) 04/07/2022 2350   RBC 4.70 04/07/2022 2350   HGB 14.2 04/07/2022 2350   HCT 39.5 04/07/2022 2350   PLT 65 (L) 04/07/2022 2350   MCV 84.0 04/07/2022 2350   MCH 30.2 04/07/2022 2350   MCHC 35.9 04/07/2022 2350   RDW 14.3 04/07/2022 2350   LYMPHSABS 4.3 (H) 03/09/2022 2310   MONOABS 0.5 03/09/2022 2310   EOSABS 0.1 03/09/2022 2310   BASOSABS 0.0 03/09/2022 2310    BMET    Component Value Date/Time   NA 140 04/07/2022 2350   K 4.0 04/07/2022 2350   CL 113 (H) 04/07/2022 2350   CO2 19 (L) 04/07/2022 2350   GLUCOSE 145 (H) 04/07/2022 2350   BUN 17 04/07/2022 2350   CREATININE 1.51 (H) 04/07/2022 2350   CALCIUM 7.8 (L) 04/07/2022 2350   GFRNONAA 56 (L) 04/07/2022 2350   GFRAA >60 01/12/2020 1421    INR    Component Value Date/Time   INR 1.5 (H) 04/07/2022 1540     Intake/Output Summary (Last 24 hours) at 04/08/2022 0744 Last data filed at 04/08/2022 0400 Gross per 24 hour  Intake 11318.52 ml  Output 5350 ml  Net 5968.52 ml     Assessment/Plan:  51 y.o. male is s/p 1.  Redo exploration of right groin 2.  Right common femoral artery thrombectomy 3.   Extensive endarterectomy of the right profunda with bovine pericardial patch angioplasty 4.  Revision of right limb of the aortobifemoral bypass graft 1 Day Post-Op   NG was pulled out last night. No nausea. Continue NPO Abdomen remains soft. Not passing flatus. No BM Continue Pain control PRN His right leg is cold with loss of motor and sensation. No Doppler signal Left leg remains well perfused and warm with Doppler PT. Motor and sensation intact Scr up slightly from baseline 1.5 H&H stable  Plan is to take patient back to OR for fem distal bypass today with Dr. Lenell Antu Dr. Chestine Spore  DVT prophylaxis:  sq heparin   Graceann Congress, PA-C [facility] [phone] 04/08/2022 7:44 AM  I have seen and evaluated the patient. I agree with the PA note as documented above.  Complex multilevel revascularization yesterday with aortobifemoral bypass including significant endarterectomies in both groins and then left femoral to PT composite bypass for CLI with tissue loss.  We had to take him back to the operating room yesterday evening due to pain and numbness in the right foot with no signals.  He only had profunda runoff prior to surgery with known bilateral SFA occlusion.  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  I think he has limited outflow to support the limb of his aortobifem.  Today still having numbness in the foot although he says the pain is better and he is able to slightly wiggle his toes.  Although the exam is somewhat improved right foot is cool with no signals.  Will plan to take back to the OR today for for a right fem tib bypass as the right leg is of concern now.  Otherwise looks ok in the ICU.  Incisions all fine.  Brisk PT signal left foot.  Hgb 14.2 and Cr slightly up to 1.51.  Jeffrey Shelling, MD [facility] [place] Office: [phone]

## 2022-04-08 NOTE — Op Note (Signed)
DATE OF SERVICE: 04/08/2022  PATIENT:  Jeffrey Fitzgerald  51 y.o. male  PRE-OPERATIVE DIAGNOSIS: Right lower extremity acute limb ischemia after aortobifemoral bypass and thrombectomy of right limb and common femoral artery   POST-OPERATIVE DIAGNOSIS:  Same  PROCEDURE:   1) right greater saphenous vein harvest 2) right common femoral to posterior tibial artery bypass with 6 mm PTFE/greater saphenous vein composite graft 3) right lower extremity angiogram 4) right lower extremity 4 compartment Fasciotomy 5) right posterior tibial artery thrombectomy  SURGEON:  Surgeon(s) and Role:    * Leonie Douglas, MD - Primary    Cephus Shelling, MD - Assisting  ASSISTANT: Lianne Cure, PA-C  An experienced assistant was required given the complexity of this procedure and the standard of surgical care. My assistant helped with exposure through counter tension, suctioning, ligation and retraction to better visualize the surgical field.  My assistant expedited sewing during the case by following my sutures. Wherever I use the term "we" in the report, my assistant actively helped me with that portion of the procedure.  ANESTHESIA:   general  EBL: 650 mL  BLOOD ADMINISTERED:  1.2 L PRBC 273 mL platelets 395 mL FFP  DRAINS: none   LOCAL MEDICATIONS USED:  NONE  SPECIMEN:  none  COUNTS: confirmed correct.  TOURNIQUET:  none   PATIENT DISPOSITION:  PACU - hemodynamically stable.   Delay start of Pharmacological VTE agent (>24hrs) due to surgical blood loss or risk of bleeding: no  INDICATION FOR PROCEDURE: [name] is a 51 y.o. male with acutely ischemic right lower extremity after aortobifemoral bypass and takeback for thrombectomy of the right limb of the aortobifemoral bypass and extended profundoplasty.  The patient continues to have no Doppler flow in his right foot despite these maneuvers.  He has a cold, insensate foot. After careful discussion of risks, benefits,  and alternatives the patient was offered right femoral tibial bypass. The patient understood and wished to proceed.  OPERATIVE FINDINGS:  Right groin reopened.  Good Doppler flow confirmed in the profunda femoris artery Right posterior tibial artery exposed through right medial mid calf incision.  The posterior tibial artery appeared healthy here. Unremarkable CFA to PT bypass in anatomic tunnel with composite graft Good Doppler flow at completion in the bypass graft.  No Doppler flow noted in the foot.   On table angiogram performed.  No outflow noted through the bypass graft. Right calf 4 compartment fasciotomy showed gross bulging of all 4 compartments.  No fasciculation was noted in the anterior or deep posterior compartments. Still no Doppler flow noted in the foot.  We open the bypass graft hood and passed arterial dilators through the anastomosis confirming its patency.  Some backbleeding was noted.  A Doppler signal returned in the foot. I tried to loosely closed the medial calf incision over the bypass graft and lost signal in the foot.  The calf incisions were reopened.  There was still no Doppler flow in the foot I cut down on the right posterior tibial artery at the foot.  A small arteriotomy was made and #2 Fogarty embolectomy balloon catheter was passed proximally with return of torrential inflow.  No thrombus was returned.  I passed this into the foot and did not return any thrombus.  There was minimal backbleeding.  Doppler flow was confirmed in the foot. The cutdown was closed.  The calf fasciotomies were covered with a wet-to-dry dressing.  The groin incision was closed with staples and nylon's.  DESCRIPTION OF PROCEDURE: After identification of the patient in the pre-operative holding area, the patient was transferred to the operating room. The patient was positioned supine on the operating room table. Anesthesia was induced. The right leg was prepped and draped in standard fashion.  A surgical pause was performed confirming correct patient, procedure, and operative location.  The longitudinal incision in the right groin was reopened sharply with Metzenbaum scissors.  The common femoral artery was exposed.  We interrogated the profunda femoris artery with Doppler machine.  Good Doppler flow was noted the profunda femoris artery.  The right posterior tibial artery was exposed using a longitudinal incision in the right mid, medial calf.  Incision was carried down through subtenons tissue until the superficial posterior compartment was encountered.  This was divided.  The soleal tibial attachments were divided.  The posterior tibial vascular bundle was identified and skeletonized.  The posterior tibial artery appeared healthy at this point.  An anatomic tunnel was created between the 2 exposures with a Kelly-Wick tunneler.  A 6 mm externally supported PTFE vascular graft was delivered through the tunneler connecting the 2 exposures.  Great care was taken to avoid twisting or kinking the vascular graft.  The patient was systemically anticoagulated.  Activated clotting time measurements were used throughout the case to confirm adequate anticoagulation.    A Cooley clamp was applied to the hood of the right aortofemoral bypass limb.  An anterior graftotomy was made with an 11 blade and extended with Potts scissors.  The proximal aspect of the vascular graft was spatulated to allow end to side anastomosis.  The [place] anastomosis was performed using continuous running suture of 5-0 Prolene.  The anastomosis was completed.  The graft was flushed on the open end of the vascular graft.  Good inflow was achieved.  Needle hole bleeding was noted.  Packed.  Attention was turned to the calf.  A small segment of greater saphenous vein was isolated in the posterior tibial artery exposure.  This was reversed and anastomosed end to side to the posterior tibial artery using continuous running suture  of 6-0 Prolene.  The anastomosis was completed.  No backbleeding was noted after completion.  The distal end of the vascular graft was then anastomosed end to end to the greater saphenous vein already anastomosed to the posterior tibial artery.  Anastomosis was performed using continuous running suture of 6-0 Prolene.  Immediately prior to completion the anastomosis was flushed and de-aired.  The anastomosis was completed.  A palpable pulse was noted throughout the bypass graft.  There was Doppler flow in the posterior tibial artery immediately distal to the bypass graft.  There is no Doppler flow at the foot.  I called for radiology while I performed a 4 compartment Fasciotomy.  I had already released the superficial and deep posterior compartments.  I made an incision over the lateral calf to expose the anterior and lateral compartments.  Incision was carried down through subcutaneous tissue until the fascia overlying the 2 compartments were identified.  A longitudinal fasciotomy was made on both compartments.  All muscle bulged after incising the fascia.  The only muscle that vesiculated was the gastrocnemius.  On table angiogram was performed.  No contrast was seen moving through the bypass graft into the native arteries of the ankle.  I reopened the hood of the composite graft.  I passed a 2, 2.5, and 3 vascular dilators through the anastomosis.  We confirm the anastomosis was widely patent.  Some backbleeding was noted.  We closed the hole in the hood of the bypass graft using 6-0 Prolene.  Doppler flow was noted in the posterior tibial artery at the foot.  I tried to close the medial calf incision to cover the vascular graft.  In doing so, we lost Doppler flow in the foot.  I released all of her suture material and no Doppler flow was noted in the posterior tibial artery.  Cut down on the right posterior tibial artery at the ankle.  The incision was carried out subtenons tissue until the vascular  bundle was identified.  The bundle was skeletonized.  A transverse arteriotomy was made in the posterior tibial artery.  Some bleeding was noted.  I passed a #2 Fogarty embolectomy balloon catheter proximally.  1 pass yielded torrential inflow.  No thrombus was noted.  I passed the catheter distally and found no thrombus.  I closed the arteriotomy using interrupted 6-0 Prolene suture.  Doppler flow was noted in the foot.  I closed the posterior tibial artery exposure using 3-0 Vicryl, 2-0 nylon, surgical stapler.  The groin was closed in layers after achieving hemostasis using 2-0 Vicryl, 3-0 Vicryl, 2-0 nylon, skin stapler.  I elected to leave the calf fasciotomies open and packed these wet-to-dry.  Upon completion of the case instrument and sharps counts were confirmed correct. The patient was transferred to the PACU in good condition. I was present for all portions of the procedure.  Rande Brunt. Lenell Antu, MD [facility] Phone Number: [phone] 04/08/2022 4:38 PM

## 2022-04-08 NOTE — Progress Notes (Signed)
Confused and pulled out NG tube. When asked if he knows where he is at, he states, "[facility]?" Reoriented but continues to have some intermittent confusion to place and pulling at tubes intermittently. Mittens placed on BUE. Dr. Myra Gianotti contacted and is now at bedside to assess. See new order for pain medication . Continuing to monitor.

## 2022-04-08 NOTE — Progress Notes (Signed)
Came to evaluate patient.  Appears to be comfortable. Pulled NG tube out, soft restraints placed.  Will leave NG out for now as long as he does not have nausea Increased Morphine to 4mg  q2 hours Both legs are warm, but under a warming blanket Brisk femoral doppler signals bilaterally, biphasic left PT signal; No right foot signals Neither foot is mottled Minimal motor function on either foot Slightly better sensation on the left than right Limited to no options for revascularization on the right Continue with supportive care for now  

## 2022-04-08 NOTE — Anesthesia Preprocedure Evaluation (Addendum)
Anesthesia Evaluation  Patient identified by MRN, date of birth, ID band Patient awake    Reviewed: Allergy & Precautions, H&P , NPO status , Patient's Chart, lab work & pertinent test results  Airway Mallampati: II  TM Distance: >3 FB Neck ROM: Full    Dental no notable dental hx. (+) Teeth Intact, Dental Advisory Given   Pulmonary Current Smoker and Patient abstained from smoking.,    Pulmonary exam normal breath sounds clear to auscultation       Cardiovascular hypertension, + Peripheral Vascular Disease   Rhythm:Regular Rate:Normal     Neuro/Psych negative neurological ROS  negative psych ROS   GI/Hepatic negative GI ROS, Neg liver ROS,   Endo/Other  negative endocrine ROS  Renal/GU negative Renal ROS  negative genitourinary   Musculoskeletal   Abdominal   Peds  Hematology negative hematology ROS (+)   Anesthesia Other Findings   Reproductive/Obstetrics negative OB ROS                            Anesthesia Physical Anesthesia Plan  ASA: 3  Anesthesia Plan: General   Post-op Pain Management: Ofirmev IV (intra-op)*   Induction: Intravenous  PONV Risk Score and Plan: 2 and Ondansetron, Dexamethasone and Midazolam  Airway Management Planned: Oral ETT  Additional Equipment:   Intra-op Plan:   Post-operative Plan: Extubation in OR  Informed Consent: I have reviewed the patients History and Physical, chart, labs and discussed the procedure including the risks, benefits and alternatives for the proposed anesthesia with the patient or authorized representative who has indicated his/her understanding and acceptance.     Dental advisory given  Plan Discussed with: CRNA  Anesthesia Plan Comments:         Anesthesia Quick Evaluation

## 2022-04-08 NOTE — Plan of Care (Signed)

## 2022-04-08 NOTE — Anesthesia Postprocedure Evaluation (Signed)
Anesthesia Post Note  Patient: Jeffrey Fitzgerald  Procedure(s) Performed: AORTA BIFEMORAL BYPASS WITH LEFT LEG BYPASS (Bilateral: Abdomen) APPLICATION OF CELL SAVER (Abdomen) LEFT LEG BYPASS GRAFT FEMORAL-POPLITEAL ARTERY (Left: Leg Lower)     Patient location during evaluation: PACU Anesthesia Type: General Level of consciousness: awake and alert Pain management: pain level controlled Vital Signs Assessment: post-procedure vital signs reviewed and stable Respiratory status: spontaneous breathing, nonlabored ventilation, respiratory function stable and patient connected to nasal cannula oxygen Cardiovascular status: blood pressure returned to baseline and stable Postop Assessment: no apparent nausea or vomiting Anesthetic complications: no   No notable events documented.  Last Vitals:  Vitals:   04/08/22 1030 04/08/22 1121  BP:  (!) 201/95  Pulse: (!) 108 (!) 109  Resp: 14 16  Temp:  36.8 C  SpO2: 97% 97%    Last Pain:  Vitals:   04/08/22 1129  TempSrc:   PainSc: 0-No pain                 Kennieth Rad

## 2022-04-08 NOTE — Anesthesia Postprocedure Evaluation (Signed)
Anesthesia Post Note  Patient: Jeffrey Fitzgerald  Procedure(s) Performed: GREATER SAPHENOUS VEIN HARVEST, RIGHT LEG BYPASS GRAFT RIGHT COMMON FEMORAL-TIBIAL ARTERY WITH COMPOSITE GRAFT (Right: Groin) APPLICATION OF WOUND VAC (Right: Groin) FASCIOTOMIES (Right: Leg Lower) LOWER EXTREMITY ANGIOGRAM (Right: Leg Lower) THROMBECTOMY POSTERIOR TIBIAL (Right: Leg Lower)     Patient location during evaluation: PACU Anesthesia Type: General Level of consciousness: awake and alert Pain management: pain level controlled Vital Signs Assessment: post-procedure vital signs reviewed and stable Respiratory status: spontaneous breathing, nonlabored ventilation, respiratory function stable and patient connected to nasal cannula oxygen Cardiovascular status: blood pressure returned to baseline and stable Postop Assessment: no apparent nausea or vomiting Anesthetic complications: no   No notable events documented.  Last Vitals:  Vitals:   04/08/22 1753 04/08/22 1754  BP:    Pulse: (!) 118 (!) 117  Resp: 13 11  Temp:    SpO2: 98% 98%    Last Pain:  Vitals:   04/08/22 1700  TempSrc:   PainSc: 0-No pain                 Kennieth Rad

## 2022-04-08 NOTE — Progress Notes (Signed)
Progress Note  Patient Name: Jeffrey Fitzgerald Date of Encounter: 04/08/2022  [facility] Cardiologist: Rollene Rotunda, MD   Subjective   Pt denies CP or dyspnea  Inpatient Medications    Scheduled Meds:  sodium chloride   Intravenous Once   amLODipine  5 mg Oral Daily   aspirin EC  81 mg Oral Q0600   atorvastatin  40 mg Oral Daily   carvedilol  12.5 mg Oral BID WC   Chlorhexidine Gluconate Cloth  6 each Topical Daily   docusate sodium  100 mg Oral Daily   heparin  5,000 Units Subcutaneous Q8H   nicotine  14 mg Transdermal Daily   pantoprazole  40 mg Oral Daily   sodium chloride flush  3 mL Intravenous Q12H   Continuous Infusions:  sodium chloride     sodium chloride     sodium chloride 125 mL/hr at 04/07/22 2324    ceFAZolin (ANCEF) IV 2 g (04/07/22 2348)   piperacillin-tazobactam (ZOSYN)  IV 3.375 g (04/08/22 0117)   vancomycin 750 mg (04/08/22 0021)   PRN Meds: sodium chloride, sodium chloride, acetaminophen **OR** acetaminophen, alum & mag hydroxide-simeth, bisacodyl, guaiFENesin-dextromethorphan, hydrALAZINE, labetalol, metoprolol tartrate, morphine injection, ondansetron, oxyCODONE-acetaminophen, phenol, polyethylene glycol, potassium chloride, sodium chloride flush   Vital Signs    Vitals:   04/08/22 0300 04/08/22 0326 04/08/22 0400 04/08/22 0430  BP:      Pulse: (!) 112  (!) 118 (!) 110  Resp: 14  12 18   Temp:  97.9 F (36.6 C) 97.9 F (36.6 C)   TempSrc:  Oral Oral   SpO2: 98%  99% 99%  Weight:      Height:        Intake/Output Summary (Last 24 hours) at 04/08/2022 0754 Last data filed at 04/08/2022 0400 Gross per 24 hour  Intake 11318.52 ml  Output 5350 ml  Net 5968.52 ml       04/07/2022    9:57 PM 04/01/2022   11:21 AM 03/30/2022    2:49 PM  Last 3 Weights  Weight (lbs) 145 lb 1 oz 145 lb 145 lb  Weight (kg) 65.8 kg 65.772 kg 65.772 kg      Telemetry    Sinus tachycardia - Personally Reviewed   Physical Exam   GEN: NAD Neck:  supple Cardiac: regular and tachycardic Respiratory: CTA GI: S/P abd surgery MS: Trace edema Neuro:  Grossly intact Psych: Normal affect   Labs     Chemistry Recent Labs  Lab 04/07/22 0258 04/07/22 1021 04/07/22 1540 04/07/22 1753 04/07/22 1843 04/07/22 2350  NA 137   < > 138 146* 144 140  K 3.8   < > 4.0 2.9* 3.4* 4.0  CL 105  --  112*  --   --  113*  CO2 23  --  19*  --   --  19*  GLUCOSE 101*  --  201*  --   --  145*  BUN 11  --  12  --   --  17  CREATININE 1.02  --  1.11  --   --  1.51*  CALCIUM 9.5  --  6.9*  --   --  7.8*  MG  --   --  1.4*  --   --  1.3*  PROT  --   --  3.9*  --   --  4.4*  ALBUMIN  --   --  2.3*  --   --  2.7*  AST  --   --  39  --   --  66*  ALT  --   --  26  --   --  39  ALKPHOS  --   --  23*  --   --  26*  BILITOT  --   --  2.0*  --   --  2.4*  GFRNONAA >60  --  >60  --   --  56*  ANIONGAP 9  --  7  --   --  8   < > = values in this interval not displayed.     Lipids  Recent Labs  Lab 04/02/22 0304  CHOL 207*  TRIG 92  HDL 45  LDLCALC 144*  CHOLHDL 4.6     Hematology Recent Labs  Lab 04/07/22 0258 04/07/22 1021 04/07/22 1540 04/07/22 1753 04/07/22 1843 04/07/22 2350  WBC 6.9  --  12.8*  --   --  12.8*  RBC 4.57  --  4.52  --   --  4.70  HGB 13.1   < > 13.5 8.5* 8.5* 14.2  HCT 38.8*   < > 39.9 25.0* 25.0* 39.5  MCV 84.9  --  88.3  --   --  84.0  MCH 28.7  --  29.9  --   --  30.2  MCHC 33.8  --  33.8  --   --  35.9  RDW 14.8  --  14.0  --   --  14.3  PLT 235  --  PLATELET CLUMPS NOTED ON SMEAR, UNABLE TO ESTIMATE  --   --  65*   < > = values in this interval not displayed.    Thyroid  Recent Labs  Lab 04/02/22 0304  TSH 0.753     Patient Profile     51 y.o. male with past medical history of hypertension, tobacco abuse, peripheral vascular disease recently evaluated preoperatively prior to bifemoral bypass for follow-up of hypertension.  Nuclear study August 2023 showed no ischemia or infarction and ejection  fraction 36%.  Echocardiogram August 2023 showed normal LV function, basal inferior hypokinesis, grade 1 diastolic dysfunction.  Patient subsequent underwent surgical intervention on August 9.  Assessment & Plan    1 status post aortobifemoral bypass with left femoral to posterior tibial bypass; subsequently had redo exploration with right common femoral artery thrombectomy and revision of right limb of the aortobifemoral bypass graft-Per vascular surgery. Continue ASA and statin.  2 hypertension-patient's blood pressure has improved this morning.  Will continue present dose of carvedilol and amlodipine.  Will advance as needed.  3 hyperlipidemia-continue Lipitor 80 mg daily.  Olga Millers, MD   For questions or updates, please contact [facility] Please consult [email or web address] for contact info under        Signed, Olga Millers, MD  04/08/2022, 7:54 AM

## 2022-04-08 NOTE — Progress Notes (Signed)
ANTICOAGULATION CONSULT NOTE - Initial Consult  Pharmacy Consult for heparin Indication:  PVD  No Known Allergies  Patient Measurements: Height: 5\' 5"  (165.1 cm) Weight: 65.8 kg (145 lb) IBW/kg (Calculated) : 61.5 Heparin Dosing Weight: 66kg  Vital Signs: Temp: 97.8 F (36.6 C) (08/10 1617) Temp Source: Oral (08/10 1121) BP: 132/116 (08/10 1617) Pulse Rate: 114 (08/10 1617)  Labs: Recent Labs    04/07/22 0258 04/07/22 1021 04/07/22 1540 04/07/22 1753 04/07/22 1843 04/07/22 2350  HGB 13.1   < > 13.5 8.5* 8.5* 14.2  HCT 38.8*   < > 39.9 25.0* 25.0* 39.5  PLT 235  --  PLATELET CLUMPS NOTED ON SMEAR, UNABLE TO ESTIMATE  --   --  65*  APTT  --   --  30  --   --   --   LABPROT  --   --  18.1*  --   --   --   INR  --   --  1.5*  --   --   --   CREATININE 1.02  --  1.11  --   --  1.51*   < > = values in this interval not displayed.    Estimated Creatinine Clearance: 50.9 mL/min (A) (by C-G formula based on SCr of 1.51 mg/dL (H)).   Medical History: Past Medical History:  Diagnosis Date   Hypertension    Peripheral vascular disease (HCC)    Tobacco abuse     Medications:  Medications Prior to Admission  Medication Sig Dispense Refill Last Dose   acetaminophen (TYLENOL) 500 MG tablet Take 500 mg by mouth every 6 (six) hours as needed for moderate pain.   03/31/2022   ibuprofen (ADVIL) 200 MG tablet Take 200 mg by mouth every 6 (six) hours as needed for moderate pain.   03/31/2022   Scheduled:   [MAR Hold] sodium chloride   Intravenous Once   [MAR Hold] amLODipine  5 mg Oral Daily   [MAR Hold] aspirin EC  81 mg Oral Q0600   [MAR Hold] atorvastatin  80 mg Oral Daily   [MAR Hold] carvedilol  12.5 mg Oral BID WC   [MAR Hold] Chlorhexidine Gluconate Cloth  6 each Topical Daily   [MAR Hold] docusate sodium  100 mg Oral Daily   nicotine  14 mg Transdermal Daily   [MAR Hold] pantoprazole  40 mg Oral Daily   [MAR Hold] sodium chloride flush  3 mL Intravenous Q12H    Infusions:   [MAR Hold] sodium chloride     [MAR Hold] sodium chloride     sodium chloride Stopped (04/07/22 2348)   [MAR Hold] sodium chloride     [MAR Hold] sodium chloride     heparin 500 Units/hr (04/08/22 1557)   linezolid (ZYVOX) IV     [MAR Hold] piperacillin-tazobactam (ZOSYN)  IV 3.375 g (04/08/22 0919)   [MAR Hold] vancomycin 750 mg (04/08/22 0021)    Assessment: Pt with PVD who is s/p bypass. Heparin was ordered by [facility] at 500 units/hr with out titration. We will follow along for now.   Goal of Therapy:   Monitor platelets by anticoagulation protocol: Yes   Plan:  Heparin 500 units/hr per [facility]VS Daily heparin and CBC  06/08/22, PharmD, BCIDP, AAHIVP, CPP Infectious Disease Pharmacist 04/08/2022 4:33 PM

## 2022-04-08 NOTE — OR Nursing (Signed)
The patient returned to room. Dr. Juanetta Gosling and Dr. Chestine Spore at bedside at 1830 to check palpable pulses in left lower leg and foot with doppler machine. Unable to detect pulses in the lower left foot and ankles. Md made aware. Ordered to keep the patient NPO after midnight. Pt's dressing to right tib/fib was draining serosanguineous drainage throught abd pad, kerlex and ace wrap. Phoned MD. And he ordered to reinforce the dressing with more kerlex and ace wraps. Pt's bed changed and reinforced dressing. Pt has heparin drip infusing at 500 units per hr as ordered.Pt has a wound vac to the right groin flowing with 125 mmg HG. Clean dry and intact. Pt's right lower pulses slightly detectable but faint. Bedside report given to [facility]. PT's art line is c/d/I to left radial. Redressed by respiratory at 1830.

## 2022-04-08 NOTE — Transfer of Care (Signed)
Immediate Anesthesia Transfer of Care Note  Patient: Jeffrey Fitzgerald  Procedure(s) Performed: GREATER SAPHENOUS VEIN HARVEST, RIGHT LEG BYPASS GRAFT RIGHT COMMON FEMORAL-TIBIAL ARTERY WITH COMPOSITE GRAFT (Right: Groin) APPLICATION OF WOUND VAC (Right: Groin) FASCIOTOMIES (Right: Leg Lower) LOWER EXTREMITY ANGIOGRAM (Right: Leg Lower) THROMBECTOMY POSTERIOR TIBIAL (Right: Leg Lower)  Patient Location: PACU  Anesthesia Type:General  Level of Consciousness: awake, alert  and oriented  Airway & Oxygen Therapy: Patient Spontanous Breathing and Patient connected to face mask oxygen  Post-op Assessment: Report given to RN and Post -op Vital signs reviewed and stable  Post vital signs: Reviewed and stable  Last Vitals:  Vitals Value Taken Time  BP    Temp    Pulse    Resp    SpO2      Last Pain:  Vitals:   04/08/22 1129  TempSrc:   PainSc: 0-No pain      Patients Stated Pain Goal: 0 (04/08/22 1129)  Complications: No notable events documented.

## 2022-04-09 ENCOUNTER — Inpatient Hospital Stay (HOSPITAL_COMMUNITY): Payer: Medicaid Other | Admitting: Certified Registered"

## 2022-04-09 ENCOUNTER — Encounter (HOSPITAL_COMMUNITY)
Admission: RE | Disposition: A | Discharge status: Home Health | Payer: Self-pay | Source: Home / Self Care | Attending: Vascular Surgery

## 2022-04-09 ENCOUNTER — Encounter (HOSPITAL_COMMUNITY): Payer: Self-pay | Admitting: Vascular Surgery

## 2022-04-09 DIAGNOSIS — F17210 Nicotine dependence, cigarettes, uncomplicated: Secondary | ICD-10-CM

## 2022-04-09 DIAGNOSIS — I10 Essential (primary) hypertension: Secondary | ICD-10-CM

## 2022-04-09 DIAGNOSIS — T82868A Thrombosis of vascular prosthetic devices, implants and grafts, initial encounter: Secondary | ICD-10-CM

## 2022-04-09 DIAGNOSIS — I739 Peripheral vascular disease, unspecified: Secondary | ICD-10-CM

## 2022-04-09 HISTORY — PX: THROMBECTOMY FEMORAL ARTERY: SHX6406

## 2022-04-09 LAB — COMPREHENSIVE METABOLIC PANEL
ALT: 55 U/L — ABNORMAL HIGH (ref 0–44)
AST: 228 U/L — ABNORMAL HIGH (ref 15–41)
Albumin: 2.4 g/dL — ABNORMAL LOW (ref 3.5–5.0)
Alkaline Phosphatase: 23 U/L — ABNORMAL LOW (ref 38–126)
Anion gap: 5 (ref 5–15)
BUN: 38 mg/dL — ABNORMAL HIGH (ref 6–20)
CO2: 20 mmol/L — ABNORMAL LOW (ref 22–32)
Calcium: 6.9 mg/dL — ABNORMAL LOW (ref 8.9–10.3)
Chloride: 113 mmol/L — ABNORMAL HIGH (ref 98–111)
Creatinine, Ser: 2.67 mg/dL — ABNORMAL HIGH (ref 0.61–1.24)
GFR, Estimated: 28 mL/min — ABNORMAL LOW (ref >60)
Glucose, Bld: 128 mg/dL — ABNORMAL HIGH (ref 70–99)
Potassium: 4 mmol/L (ref 3.5–5.1)
Sodium: 138 mmol/L (ref 135–145)
Total Bilirubin: 1.1 mg/dL (ref 0.3–1.2)
Total Protein: 4 g/dL — ABNORMAL LOW (ref 6.5–8.1)

## 2022-04-09 LAB — CBC WITH DIFFERENTIAL/PLATELET
Abs Immature Granulocytes: 0.06 10*3/uL (ref 0.00–0.07)
Basophils Absolute: 0 10*3/uL (ref 0.0–0.1)
Basophils Relative: 0 %
Eosinophils Absolute: 0 10*3/uL (ref 0.0–0.5)
Eosinophils Relative: 0 %
HCT: 27 % — ABNORMAL LOW (ref 39.0–52.0)
Hemoglobin: 9.8 g/dL — ABNORMAL LOW (ref 13.0–17.0)
Immature Granulocytes: 1 %
Lymphocytes Relative: 16 %
Lymphs Abs: 2 10*3/uL (ref 0.7–4.0)
MCH: 30.5 pg (ref 26.0–34.0)
MCHC: 36.3 g/dL — ABNORMAL HIGH (ref 30.0–36.0)
MCV: 84.1 fL (ref 80.0–100.0)
Monocytes Absolute: 1.7 10*3/uL — ABNORMAL HIGH (ref 0.1–1.0)
Monocytes Relative: 14 %
Neutro Abs: 8.6 10*3/uL — ABNORMAL HIGH (ref 1.7–7.7)
Neutrophils Relative %: 69 %
Platelets: 104 10*3/uL — ABNORMAL LOW (ref 150–400)
RBC: 3.21 MIL/uL — ABNORMAL LOW (ref 4.22–5.81)
RDW: 14.6 % (ref 11.5–15.5)
WBC: 12.4 10*3/uL — ABNORMAL HIGH (ref 4.0–10.5)
nRBC: 0 % (ref 0.0–0.2)

## 2022-04-09 LAB — PREPARE FRESH FROZEN PLASMA

## 2022-04-09 LAB — BASIC METABOLIC PANEL
Anion gap: 6 (ref 5–15)
BUN: 42 mg/dL — ABNORMAL HIGH (ref 6–20)
CO2: 21 mmol/L — ABNORMAL LOW (ref 22–32)
Calcium: 7 mg/dL — ABNORMAL LOW (ref 8.9–10.3)
Chloride: 111 mmol/L (ref 98–111)
Creatinine, Ser: 2.98 mg/dL — ABNORMAL HIGH (ref 0.61–1.24)
GFR, Estimated: 25 mL/min — ABNORMAL LOW (ref >60)
Glucose, Bld: 107 mg/dL — ABNORMAL HIGH (ref 70–99)
Potassium: 4 mmol/L (ref 3.5–5.1)
Sodium: 138 mmol/L (ref 135–145)

## 2022-04-09 LAB — BPAM FFP
Blood Product Expiration Date: 202308142359
Blood Product Expiration Date: 202308152359
ISSUE DATE / TIME: 202308101441
ISSUE DATE / TIME: 202308101441
Unit Type and Rh: 600
Unit Type and Rh: 600

## 2022-04-09 LAB — PREPARE PLATELET PHERESIS: Unit division: 0

## 2022-04-09 LAB — BPAM PLATELET PHERESIS
Blood Product Expiration Date: 202308122359
ISSUE DATE / TIME: 202308101444
Unit Type and Rh: 5100

## 2022-04-09 LAB — POCT ACTIVATED CLOTTING TIME: Activated Clotting Time: 323 seconds

## 2022-04-09 LAB — HEPARIN LEVEL (UNFRACTIONATED): Heparin Unfractionated: <0.1 IU/mL — ABNORMAL LOW (ref 0.30–0.70)

## 2022-04-09 LAB — MAGNESIUM: Magnesium: 2.2 mg/dL (ref 1.7–2.4)

## 2022-04-09 SURGERY — THROMBECTOMY, ARTERY, FEMORAL
Anesthesia: General | Laterality: Bilateral

## 2022-04-09 MED ORDER — FENTANYL CITRATE (PF) 250 MCG/5ML IJ SOLN
INTRAMUSCULAR | Status: DC | PRN
  Administered 2022-04-09 (×3): 50 ug via INTRAVENOUS

## 2022-04-09 MED ORDER — HEPARIN SODIUM (PORCINE) 1000 UNIT/ML IJ SOLN
INTRAMUSCULAR | Status: DC | PRN
  Administered 2022-04-09: 6000 [IU] via INTRAVENOUS

## 2022-04-09 MED ORDER — ROCURONIUM BROMIDE 10 MG/ML (PF) SYRINGE
PREFILLED_SYRINGE | INTRAVENOUS | Status: AC
  Filled 2022-04-09: qty 10

## 2022-04-09 MED ORDER — MEPERIDINE HCL 25 MG/ML IJ SOLN: 6.2500 mg | INTRAMUSCULAR | Status: DC | PRN

## 2022-04-09 MED ORDER — PHENYLEPHRINE 80 MCG/ML (10ML) SYRINGE FOR IV PUSH (FOR BLOOD PRESSURE SUPPORT)
PREFILLED_SYRINGE | INTRAVENOUS | Status: DC | PRN
  Administered 2022-04-09 (×2): 80 ug via INTRAVENOUS
  Administered 2022-04-09 (×3): 160 ug via INTRAVENOUS
  Administered 2022-04-09: 80 ug via INTRAVENOUS

## 2022-04-09 MED ORDER — MIDAZOLAM HCL 2 MG/2ML IJ SOLN
INTRAMUSCULAR | Status: DC | PRN
  Administered 2022-04-09: 2 mg via INTRAVENOUS

## 2022-04-09 MED ORDER — ROCURONIUM BROMIDE 10 MG/ML (PF) SYRINGE
PREFILLED_SYRINGE | INTRAVENOUS | Status: AC
Start: 2022-04-09 — End: ?
  Filled 2022-04-09: qty 10

## 2022-04-09 MED ORDER — FENTANYL CITRATE (PF) 250 MCG/5ML IJ SOLN
INTRAMUSCULAR | Status: AC
  Filled 2022-04-09: qty 5

## 2022-04-09 MED ORDER — LIDOCAINE 2% (20 MG/ML) 5 ML SYRINGE
INTRAMUSCULAR | Status: AC
Start: 2022-04-09 — End: ?
  Filled 2022-04-09: qty 5

## 2022-04-09 MED ORDER — ONDANSETRON HCL 4 MG/2ML IJ SOLN
INTRAMUSCULAR | Status: AC
  Filled 2022-04-09: qty 4

## 2022-04-09 MED ORDER — ONDANSETRON HCL 4 MG/2ML IJ SOLN
INTRAMUSCULAR | Status: DC | PRN
  Administered 2022-04-09: 4 mg via INTRAVENOUS

## 2022-04-09 MED ORDER — PROPOFOL 10 MG/ML IV BOLUS
INTRAVENOUS | Status: AC
  Filled 2022-04-09: qty 20

## 2022-04-09 MED ORDER — PHENYLEPHRINE HCL-NACL 20-0.9 MG/250ML-% IV SOLN
INTRAVENOUS | Status: DC | PRN
  Administered 2022-04-09: 50 ug/min via INTRAVENOUS

## 2022-04-09 MED ORDER — PHENYLEPHRINE 80 MCG/ML (10ML) SYRINGE FOR IV PUSH (FOR BLOOD PRESSURE SUPPORT)
PREFILLED_SYRINGE | INTRAVENOUS | Status: AC
  Filled 2022-04-09: qty 10

## 2022-04-09 MED ORDER — HYDROMORPHONE HCL 1 MG/ML IJ SOLN: 0.2500 mg | INTRAMUSCULAR | Status: DC | PRN

## 2022-04-09 MED ORDER — ORAL CARE MOUTH RINSE: 15.0000 mL | Freq: Once | OROMUCOSAL | Status: AC

## 2022-04-09 MED ORDER — HEMOSTATIC AGENTS (NO CHARGE) OPTIME
TOPICAL | Status: DC | PRN
  Administered 2022-04-09 (×2): 1 via TOPICAL

## 2022-04-09 MED ORDER — ONDANSETRON HCL 4 MG/2ML IJ SOLN
4.0000 mg | Freq: Once | INTRAMUSCULAR | Status: DC | PRN
Start: 2022-04-09 — End: 2022-04-09

## 2022-04-09 MED ORDER — LIDOCAINE 2% (20 MG/ML) 5 ML SYRINGE
INTRAMUSCULAR | Status: DC | PRN
  Administered 2022-04-09: 100 mg via INTRAVENOUS

## 2022-04-09 MED ORDER — LIDOCAINE 2% (20 MG/ML) 5 ML SYRINGE
INTRAMUSCULAR | Status: AC
  Filled 2022-04-09: qty 5

## 2022-04-09 MED ORDER — ALBUMIN HUMAN 5 % IV SOLN: INTRAVENOUS | Status: DC | PRN

## 2022-04-09 MED ORDER — HEPARIN 6000 UNIT IRRIGATION SOLUTION
Status: DC | PRN
  Administered 2022-04-09: 1

## 2022-04-09 MED ORDER — HEPARIN 6000 UNIT IRRIGATION SOLUTION
Status: AC
  Filled 2022-04-09: qty 500

## 2022-04-09 MED ORDER — ROCURONIUM BROMIDE 10 MG/ML (PF) SYRINGE
PREFILLED_SYRINGE | INTRAVENOUS | Status: DC | PRN
  Administered 2022-04-09: 70 mg via INTRAVENOUS

## 2022-04-09 MED ORDER — ACETAMINOPHEN 500 MG PO TABS
1000.0000 mg | ORAL_TABLET | Freq: Once | ORAL | Status: DC
  Filled 2022-04-09: qty 2

## 2022-04-09 MED ORDER — CHLORHEXIDINE GLUCONATE 0.12 % MT SOLN
OROMUCOSAL | Status: AC
  Filled 2022-04-09: qty 15

## 2022-04-09 MED ORDER — SODIUM CHLORIDE 0.9 % IV SOLN: INTRAVENOUS | Status: DC

## 2022-04-09 MED ORDER — SODIUM CHLORIDE 0.9 % IV SOLN: INTRAVENOUS | Status: DC | PRN

## 2022-04-09 MED ORDER — MIDAZOLAM HCL 2 MG/2ML IJ SOLN
INTRAMUSCULAR | Status: AC
  Filled 2022-04-09: qty 2

## 2022-04-09 MED ORDER — CHLORHEXIDINE GLUCONATE 0.12 % MT SOLN
15.0000 mL | Freq: Once | OROMUCOSAL | Status: AC
  Administered 2022-04-09: 15 mL via OROMUCOSAL

## 2022-04-09 MED ORDER — 0.9 % SODIUM CHLORIDE (POUR BTL) OPTIME
TOPICAL | Status: DC | PRN
  Administered 2022-04-09: 2000 mL

## 2022-04-09 MED ORDER — PROPOFOL 10 MG/ML IV BOLUS
INTRAVENOUS | Status: DC | PRN
  Administered 2022-04-09: 150 mg via INTRAVENOUS

## 2022-04-09 MED ORDER — SUGAMMADEX SODIUM 200 MG/2ML IV SOLN
INTRAVENOUS | Status: DC | PRN
  Administered 2022-04-09: 275.2 mg via INTRAVENOUS

## 2022-04-09 MED ORDER — ACETAMINOPHEN 10 MG/ML IV SOLN: 1000.0000 mg | Freq: Once | INTRAVENOUS | Status: DC | PRN

## 2022-04-09 SURGICAL SUPPLY — 72 items
BAG COUNTER SPONGE SURGICOUNT (BAG) ×2 IMPLANT
BAG ISOLATION DRAPE 18X18 (DRAPES) IMPLANT
BANDAGE ESMARK 6X9 LF (GAUZE/BANDAGES/DRESSINGS) IMPLANT
BNDG ELASTIC 4X5.8 VLCR STR LF (GAUZE/BANDAGES/DRESSINGS) ×1 IMPLANT
BNDG ESMARK 6X9 LF (GAUZE/BANDAGES/DRESSINGS)
CANISTER SUCT 3000ML PPV (MISCELLANEOUS) ×2 IMPLANT
CATH EMB 2FR 60CM (CATHETERS) ×1 IMPLANT
CATH EMB 3FR 80CM (CATHETERS) ×1 IMPLANT
CATH EMB 4FR 80CM (CATHETERS) ×3 IMPLANT
CLIP TI MEDIUM 6 (CLIP) ×1 IMPLANT
CLIP TI WIDE RED SMALL 6 (CLIP) ×1 IMPLANT
CLIP VESOCCLUDE MED 24/CT (CLIP) ×1 IMPLANT
CLIP VESOCCLUDE SM WIDE 24/CT (CLIP) ×1 IMPLANT
CONNECTOR Y ATS VAC SYSTEM (MISCELLANEOUS) ×1 IMPLANT
COVER PROBE W GEL 5X96 (DRAPES) ×2 IMPLANT
CUFF TOURN SGL QUICK 24 (TOURNIQUET CUFF)
CUFF TOURN SGL QUICK 34 (TOURNIQUET CUFF)
CUFF TOURN SGL QUICK 42 (TOURNIQUET CUFF) IMPLANT
CUFF TRNQT CYL 24X4X16.5-23 (TOURNIQUET CUFF) IMPLANT
CUFF TRNQT CYL 34X4.125X (TOURNIQUET CUFF) IMPLANT
DRAIN CHANNEL 15F RND FF W/TCR (WOUND CARE) IMPLANT
DRAPE C-ARM 42X72 X-RAY (DRAPES) ×1 IMPLANT
DRAPE INCISE 23X17 IOBAN STRL (DRAPES) ×6
DRAPE INCISE 23X17 STRL (DRAPES) IMPLANT
DRAPE INCISE IOBAN 23X17 STRL (DRAPES) ×3 IMPLANT
DRAPE ISOLATION BAG 18X18 (DRAPES) ×2
DRSG COVADERM 4X10 (GAUZE/BANDAGES/DRESSINGS) ×1 IMPLANT
DRSG COVADERM 4X8 (GAUZE/BANDAGES/DRESSINGS) ×1 IMPLANT
DRSG VAC ATS LRG SENSATRAC (GAUZE/BANDAGES/DRESSINGS) ×1 IMPLANT
ELECT REM PT RETURN 9FT ADLT (ELECTROSURGICAL) ×2
ELECTRODE REM PT RTRN 9FT ADLT (ELECTROSURGICAL) ×1 IMPLANT
EVACUATOR SILICONE 100CC (DRAIN) IMPLANT
GLOVE BIO SURGEON STRL SZ7.5 (GLOVE) ×2 IMPLANT
GLOVE BIOGEL PI IND STRL 8 (GLOVE) ×1 IMPLANT
GLOVE BIOGEL PI INDICATOR 8 (GLOVE) ×1
GOWN STRL REUS W/ TWL LRG LVL3 (GOWN DISPOSABLE) ×2 IMPLANT
GOWN STRL REUS W/ TWL XL LVL3 (GOWN DISPOSABLE) ×2 IMPLANT
GOWN STRL REUS W/TWL LRG LVL3 (GOWN DISPOSABLE) ×4
GOWN STRL REUS W/TWL XL LVL3 (GOWN DISPOSABLE) ×4
HEMOSTAT SNOW SURGICEL 2X4 (HEMOSTASIS) ×2 IMPLANT
HEMOSTAT SPONGE AVITENE ULTRA (HEMOSTASIS) IMPLANT
HEMOSTAT SURGICEL 2X14 (HEMOSTASIS) ×1 IMPLANT
INSERT FOGARTY SM (MISCELLANEOUS) IMPLANT
KIT BASIN OR (CUSTOM PROCEDURE TRAY) ×2 IMPLANT
KIT TURNOVER KIT B (KITS) ×2 IMPLANT
NS IRRIG 1000ML POUR BTL (IV SOLUTION) ×4 IMPLANT
PACK PERIPHERAL VASCULAR (CUSTOM PROCEDURE TRAY) ×2 IMPLANT
PAD ARMBOARD 7.5X6 YLW CONV (MISCELLANEOUS) ×4 IMPLANT
PAD NEG PRESSURE SENSATRAC (MISCELLANEOUS) ×1 IMPLANT
STAPLER VISISTAT 35W (STAPLE) ×1 IMPLANT
STOPCOCK 4 WAY LG BORE MALE ST (IV SETS) IMPLANT
SUT ETHILON 2 0 PSLX (SUTURE) ×5 IMPLANT
SUT ETHILON 3 0 PS 1 (SUTURE) IMPLANT
SUT GORETEX 5 0 TT13 24 (SUTURE) IMPLANT
SUT GORETEX 6.0 TT13 (SUTURE) IMPLANT
SUT MNCRL AB 4-0 PS2 18 (SUTURE) ×4 IMPLANT
SUT PROLENE 5 0 C 1 24 (SUTURE) ×1 IMPLANT
SUT PROLENE 6 0 BV (SUTURE) ×3 IMPLANT
SUT PROLENE 7 0 BV 1 (SUTURE) IMPLANT
SUT SILK 2 0 PERMA HAND 18 BK (SUTURE) IMPLANT
SUT SILK 3 0 (SUTURE)
SUT SILK 3-0 18XBRD TIE 12 (SUTURE) IMPLANT
SUT VIC AB 2-0 CT1 27 (SUTURE) ×6
SUT VIC AB 2-0 CT1 TAPERPNT 27 (SUTURE) ×2 IMPLANT
SUT VIC AB 3-0 SH 27 (SUTURE)
SUT VIC AB 3-0 SH 27X BRD (SUTURE) ×3 IMPLANT
SYR 3ML LL SCALE MARK (SYRINGE) ×3 IMPLANT
TOWEL GREEN STERILE (TOWEL DISPOSABLE) ×2 IMPLANT
TRAY FOLEY MTR SLVR 16FR STAT (SET/KITS/TRAYS/PACK) ×1 IMPLANT
TUBING EXTENTION W/L.L. (IV SETS) IMPLANT
UNDERPAD 30X36 HEAVY ABSORB (UNDERPADS AND DIAPERS) ×2 IMPLANT
WATER STERILE IRR 1000ML POUR (IV SOLUTION) ×2 IMPLANT

## 2022-04-09 NOTE — Progress Notes (Addendum)
[facility] [place]  Subjective  - awake and has no complaints of pain   Objective (!) 133/90 (!) 110 98.3 F (36.8 C) (Oral) 18 97%  Intake/Output Summary (Last 24 hours) at 04/09/2022 0732 Last data filed at 04/09/2022 0650 Gross per 24 hour  Intake 5984.25 ml  Output 1555 ml  Net 4429.25 ml    No doppler signals in the left FOOT, loss of motor in the left foot and ankle, compartments soft.  Palpable left femoral pulse. Right LE dressing in place.  PT doppler signal at ankle intact left groin soft with healing incision,  incisional vac in place right groin Lungs non labored breathing  Heart Tachy 110 bpm Abdomin soft  Assessment/Planning: S/P  Redo exploration of right groin 2.  Right common femoral artery thrombectomy 3.  Extensive endarterectomy of the right profunda with bovine pericardial patch angioplasty 4.  Revision of right limb of the aortobifemoral bypass graft  Followed by return to the or for right LE CLI PROCEDURE:   1) right greater saphenous vein harvest 2) right common femoral to posterior tibial artery bypass with 6 mm PTFE/greater saphenous vein composite graft 3) right lower extremity angiogram 4) right lower extremity 4 compartment Fasciotomy 5) right posterior tibial artery thrombectomy  No doppler signals in the left foot this am, left LE warm to mid calf.  He does have a palpable left femoral pulse with good inflow.  Patient denies pain in B LE.  No distal motor B LE.  He may need left LE thrombectomy. Heparin 500/hr Cr elevated 2.9 s/p angiogram 04/08/22 will observe for now Leukocytosis stable 12.4 HGB stable post intraoperative transfusion for acute blood loss anemia 9.8 this am. Pending exam and recommendations per Dr. Chestine Spore Will observe for now he is NPO Plan for left LE thrombectomy and right LE fasciotomy irrigation and possible closure by Dr. Chestine Spore today 04/09/22  Mosetta Pigeon 04/09/2022 7:32  AM --  Laboratory Lab Results: Recent Labs    04/07/22 2350 04/08/22 1314 04/08/22 1444 04/09/22 0009  WBC 12.8*  --   --  12.4*  HGB 14.2   < > 8.2* 9.8*  HCT 39.5   < > 24.0* 27.0*  PLT 65*  --   --  104*   < > = values in this interval not displayed.   BMET Recent Labs    04/09/22 0009 04/09/22 0625  NA 138 138  K 4.0 4.0  CL 113* 111  CO2 20* 21*  GLUCOSE 128* 107*  BUN 38* 42*  CREATININE 2.67* 2.98*  CALCIUM 6.9* 7.0*    COAG Lab Results  Component Value Date   INR 1.5 (H) 04/07/2022   INR 0.9 03/09/2022   No results found for: "PTT"  I have seen and evaluated the patient. I agree with the PA note as documented above.  Postop day 2 status post aortobifemoral bypass with extensive bilateral femoral endarterectomies and profundoplasty's and a left fem-tib bypass.  Postop course has been complicated by acute on chronic ischemia of the right lower extremity requiring additional takeback's including a tibial bypass on the right.  Now has brisk PT signal in the right foot after common femoral to PT bypass yesterday.  No signals in the left foot and he had a brisk signal here yesterday.  Will plan to return to the OR today for thrombectomy of the left leg bypass and will washout and evaluate closing the right leg fasciotomies.  I think this is  our last attempt at limb salvage as I discussed with him.  He has severe multilevel occlusive disease with very compromised outflow that has made everything poorly durable.  Also now has acute renal failure with a creatinine of 2.9.  Continue IV hydration.  I suspect he has ATN from IntraOp hypotension.  Urine output 900 mL / 24 hours.  Hemoglobin today stable at 9.8.  He has been on broad-spectrum antibiotics for purulent drainage from the toe wound in the left foot.  I think he is very high risk for bilateral lower extremity amputations.  Prior to aortobifemoral bypass he could not heal even above knee amputation.  Cephus Shelling, MD [facility] [place] Office: [phone]

## 2022-04-09 NOTE — TOC Initial Note (Addendum)
Transition of Care [facility]) - Initial/Assessment Note    Patient Details  Name: Jeffrey Fitzgerald MRN: [id number] Date of Birth: 1970-09-19  [facility] of [facility]) CM/SW Contact:    Delilah Shan, LCSWA Phone Number: 04/09/2022, 2:02 PM  Clinical Narrative:           Update- 4:32pm- CSW spoke with [name] who reports patients dc plan will be to return home. Jeffrey Fitzgerald reports patient has no income and unable to pay for SNF privately. CSW informed patients sister that Case manager will reach out to her regarding patients home needs.Patients sister thanked CSW. TOC will continue to follow.  CSW received consult for possible SNF placement at time of discharge. CSW spoke with patient at bedside regarding PT recommendation of SNF placement at time of discharge.Patient reports he comes from home alone. Patient confirmed he does not have any insurance. Patient expressed understanding of PT recommendation and is agreeable to SNF placement  at time of discharge. Patient reports he will pay privately for rehab.Patient gave CSW permission to fax out initial referral near the [place] area.Patient reports he has received the COVID vaccines,no booster. Patient gave CSW permission to discuss his dc plan with his sister Jeffrey Fitzgerald. Permission given to reach out to financial counseling to screen patient for medicaid. CSW emailed Christia Reading with financial counseling and requested to screen patient for medicaid.No further questions reported at this time. CSW to continue to follow and assist with discharge planning needs.    Expected Discharge Plan: Skilled Nursing Facility Barriers to Discharge: Continued Medical Work up   Patient Goals and CMS Choice Patient states their goals for this hospitalization and ongoing recovery are:: SNF CMS Medicare.gov Compare Post Acute Care list provided to:: Patient Choice offered to / list presented to : Patient  Expected Discharge Plan and Services Expected  Discharge Plan: Skilled Nursing Facility In-house Referral: Clinical Social Work Discharge Planning Services: CM Consult   Living arrangements for the past 2 months: Single Family Home                                      Prior Living Arrangements/Services Living arrangements for the past 2 months: Single Family Home Lives with:: Self Patient language and need for interpreter reviewed:: Yes Do you feel safe going back to the place where you live?: No   SNF  Need for Family Participation in Patient Care: Yes (Comment) Care giver support system in place?: Yes (comment) Current home services: DME [employer], walkers) Criminal Activity/Legal Involvement Pertinent to Current Situation/Hospitalization: No - Comment as needed  Activities of Daily Living Home Assistive Devices/Equipment: Electric scooter ADL Screening (condition at time of admission) Patient's cognitive ability adequate to safely complete daily activities?: No Is the patient deaf or have difficulty hearing?: No Does the patient have difficulty seeing, even when wearing glasses/contacts?: No Does the patient have difficulty concentrating, remembering, or making decisions?: No Patient able to express need for assistance with ADLs?: Yes Does the patient have difficulty dressing or bathing?: Yes Independently performs ADLs?: Yes (appropriate for developmental age) Does the patient have difficulty walking or climbing stairs?: Yes Weakness of Legs: Both Weakness of Arms/Hands: None  Permission Sought/Granted Permission sought to share information with : Case Manager, Magazine features editor, Family Supports Permission granted to share information with : Yes, Verbal Permission Granted  Share Information with NAME: Jeffrey Fitzgerald  Permission granted to share info w AGENCY: SNF  Permission granted to share info w Relationship: sister  Permission granted to share info w Contact Information: Jeffrey Fitzgerald [phone]  Emotional  Assessment Appearance:: Appears stated age Attitude/Demeanor/Rapport: Gracious Affect (typically observed): Calm Orientation: : Oriented to Self, Oriented to Place, Oriented to  Time, Oriented to Situation Alcohol / Substance Use: Not Applicable Psych Involvement: No (comment)  Admission diagnosis:  Critical limb ischemia of both lower extremities (HCC) [I70.223] PAD (peripheral artery disease) (HCC) [I73.9] Patient Active Problem List   Diagnosis Date Noted   PAD (peripheral artery disease) (HCC) 04/07/2022   Critical limb ischemia of both lower extremities (HCC) 04/01/2022   Critical limb ischemia of left lower extremity with ulceration of ankle (HCC) 03/30/2022   Iliac artery occlusion, 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 [place] [address] Phone: [phone] Fax: [phone]     Social Determinants of Health (SDOH) Interventions    Readmission Risk Interventions     No data to display

## 2022-04-09 NOTE — Op Note (Signed)
Date: April 09, 2022  Preoperative diagnosis:  Thrombosed left common femoral to posterior tibial artery composite bypass in setting of critical limb ischemia with tissue loss Right calf fasciotomies  Postoperative diagnosis: Same  Procedure: 1.  Thrombectomy of left common femoral to posterior tibial artery bypass 2.  Closure of right medial calf fasciotomy 3.  Application of negative pressure wound VAC greater right lateral calf fasciotomy  Surgeon: Dr. Cephus Shelling, MD  Assistant: Doreatha Massed, PA  Indications: 51 year old male who is now postop day 2 status post aortobifemoral bypass with bilateral femoral endarterectomies including profundoplasty with known chronic SFA occlusion bilaterally and a left femoral to PT bypass for critical limb ischemia with tissue loss.  He has had a complicated postop course including thrombectomy of the right common femoral artery with revision of the right limb of the aortobifemoral bypass and also right common femoral to PT bypass for acute on chronic limb ischemia and right leg fasciotomies.  This morning he had no signal in the left foot and we suspect his bypass is occluded.  He presents for left bypass thrombectomy and also discussed trying to close his right leg fasciotomies after risk benefits discussed.  He remains high risk for limb loss.  Findings: The left medial calf incision was reopened and there was no pulse in the bypass.  Ultimately a #4 Fogarty was used to thrombectomized the PTFE segment of the bypass with excellent pulsatile inflow.  I then passed the #2 and #3 Fogarty distally into the vein graft and PT artery and got some acute thrombus as well.  I then passed a #2.5 and #3 coronary dilators down the posterior tibial into the ankle without any significant resistance.  I shortened the bypass and performed new end to end primary anastomosis of the PTFE and vein graft.  Excellent PT signal at completion.  The right medial  fasciotomies were closed with nylon's.  The right lateral fasciotomies were covered with a wound VAC.  Excellent right PT signal after wound closure.    Anesthesia: General  EBL: 250 mL  Details: Patient was taken to the operating room after informed consent was obtained.  Placed on operative table in the supine position.  General endotracheal anesthesia was induced.  Bilateral lower extremities were prepped and draped in standard sterile fashion.  Timeout was performed.  Antibiotics were updated.  Initially turned our attention of the left leg and reopened the distal incision in the medial calf where the distal anastomosis of the bypass was sewn to the posterior tibial artery.  Once we reopened this incision there was no pulse in the bypass.  I took down the anastomosis between the PTFE and the saphenous vein.  I then used a #4 Fogarty and thrombectomized the PTFE graft proximally and got excellent pulsatile inflow and this was controlled with a vascular clamp and the patient was given 6000 units of IV heparin.  We did check an ACT to confirm was greater than 250.  I then passed a #2 and #3 Fogarty distally through the vein graft and across the anastomosis to the posterior tibial artery and got a small amount of thrombus.  I then took a #2.5 and #3 coronary dilator across the distal anastomosis and down the posterior tibial artery with no resistance.  I then shorten the vein graft slightly and performed a new primary end-to-end anastomosis between the PTFE and the saphenous vein and de-aired this prior to completion.  Once we came off clamps we had  a triphasic PT signal distal to the bypass and into the ankle.  Surgicel snow was used for hemostasis.  We elected not to reverse.  We then took 2-0 nylon's and performed horizontal mattress to close the medial fasciotomy in the right calf after it was irrigated with sterile saline.  I did confirm with Doppler that had flow in the right foot after these  fasciotomies were closed.  The muscle was viable.  The lateral fasciotomy could not be closed and we placed a large VAC sponge with Ioban and a track pad.  He had Doppler signals in both feet through the PT artery.  The left medial calf incision was closed with 3-0 Vicryl and 2-0 Nylon.  Heparin was continued throughout the case at 500 units an hour.  Will be taken back to the ICU.  Complication: None  Condition: ICU  Cephus Shelling, MD [facility] Office: [phone]   Cephus Shelling

## 2022-04-09 NOTE — Progress Notes (Signed)
OT Cancellation Note  Patient Details Name: Jeffrey Fitzgerald MRN: [id number] DOB: 1971/04/15   Cancelled Treatment:    Reason Eval/Treat Not Completed: Pain limiting ability to participate.  Scheduled procedure.  OT to continue efforts.    Daneil Beem D Jourdan Maldonado 04/09/2022, 8:02 AM 04/09/2022  RP, OTR/L  Acute Rehabilitation Services  Office:  [phone]

## 2022-04-09 NOTE — Consult Note (Signed)
WOC Nurse Consult Note: Patient receiving care in [facility] 2H8. Reason for Consult: left ankle wound Wound type: full thickness, related to ischemia Pressure Injury POA: Yes/No/NA Measurement: 2.5 cm x 2.5 cm x unknown depth Wound bed: brown and red Drainage (amount, consistency, odor) serosanginous Periwound: intact Dressing procedure/placement/frequency: Place a small piece of Aquacel Advantage [place] [phone]) over the left lateral ankle wound, cover with a small foam dressing. Change every 2 days.  I requested the [place] to order 3 Aquacel dressings and 2 Prevalon boots.  Monitor the wound area(s) for worsening of condition such as: Signs/symptoms of infection,  Increase in size,  Development of or worsening of odor, Development of pain, or increased pain at the affected locations.  Notify the medical team if any of these develop.  Thank you for the consult.  Discussed plan of care with the patient and bedside nurse.  WOC nurse will not follow at this time.  Please re-consult the WOC team if needed.  Helmut Muster, RN, MSN, CWOCN, CNS-BC, pager [phone]

## 2022-04-09 NOTE — Transfer of Care (Signed)
Immediate Anesthesia Transfer of Care Note  Patient: Jeffrey Fitzgerald  Procedure(s) Performed: LEFT  THROMBECTOMY FEMORAL POSTERIOR TIBIAL BYPASS AND RIGHT LATERAL  LOWER EXTREMITY FASCIOTOMY CLOSURE  AND WOUND VAC PLACEMENT (Bilateral)  Patient Location: PACU  Anesthesia Type:General  Level of Consciousness: awake, oriented, drowsy and patient cooperative  Airway & Oxygen Therapy: Patient Spontanous Breathing and Patient connected to face mask oxygen  Post-op Assessment: Report given to RN, Post -op Vital signs reviewed and stable and Patient moving all extremities X 4  Post vital signs: Reviewed and stable  Last Vitals:  Vitals Value Taken Time  BP 157/83 04/09/22 1220  Temp    Pulse 106 04/09/22 1228  Resp 14 04/09/22 1228  SpO2 99 % 04/09/22 1228  Vitals shown include unvalidated device data.  Last Pain:  Vitals:   04/09/22 0852  TempSrc:   PainSc: 0-No pain      Patients Stated Pain Goal: 0 (04/08/22 1129)  Complications: No notable events documented.

## 2022-04-09 NOTE — Anesthesia Procedure Notes (Addendum)
Procedure Name: Intubation Date/Time: 04/09/2022 10:08 AM  Performed by: Annamary Carolin, CRNAPre-anesthesia Checklist: Patient identified, Emergency Drugs available, Suction available and Patient being monitored Patient Re-evaluated:Patient Re-evaluated prior to induction Oxygen Delivery Method: Circle System Utilized Preoxygenation: Pre-oxygenation with 100% oxygen Induction Type: IV induction Ventilation: Mask ventilation without difficulty Laryngoscope Size: 3 and Mac Grade View: Grade II Tube type: Oral Number of attempts: 1 Airway Equipment and Method: Stylet and Oral airway Placement Confirmation: ETT inserted through vocal cords under direct vision, positive ETCO2 and breath sounds checked- equal and bilateral Tube secured with: Tape Dental Injury: Teeth and Oropharynx as per pre-operative assessment  Comments: Dentition poor. Bruise in back of oropharynx noted. Pt airway as preop.

## 2022-04-09 NOTE — Anesthesia Postprocedure Evaluation (Signed)
Anesthesia Post Note  Patient: Jeffrey Fitzgerald  Procedure(s) Performed: LEFT  THROMBECTOMY FEMORAL POSTERIOR TIBIAL BYPASS AND RIGHT LATERAL  LOWER EXTREMITY FASCIOTOMY CLOSURE  AND WOUND VAC PLACEMENT (Bilateral)     Patient location during evaluation: PACU Anesthesia Type: General Level of consciousness: awake and sedated Pain management: pain level controlled Vital Signs Assessment: post-procedure vital signs reviewed and stable Respiratory status: spontaneous breathing Cardiovascular status: stable Postop Assessment: no apparent nausea or vomiting Anesthetic complications: no   No notable events documented.  Last Vitals:  Vitals:   04/09/22 1500 04/09/22 1515  BP: 122/72   Pulse: (!) 109 (!) 105  Resp: (!) 24 14  Temp:    SpO2: 100% 100%    Last Pain:  Vitals:   04/09/22 1300  TempSrc:   PainSc: Asleep                 Caren Macadam

## 2022-04-09 NOTE — Progress Notes (Signed)
  Day of Surgery Note    Subjective:  resting comfortably   Vitals:   04/09/22 1220 04/09/22 1230  BP: (!) 157/83 (!) 153/87  Pulse: (!) 103 (!) 107  Resp: 17 15  Temp: (!) 97.1 F (36.2 C)   SpO2: 100% 100%    Incisions:   wound vac to lateral right leg, otherwise bandages in place BLE Extremities:  brisk doppler signals bilateral PT Cardiac:  regular    Assessment/Plan:  This is a 51 y.o. male who is s/p  1.  Thrombectomy of left common femoral to posterior tibial artery bypass 2.  Closure of right medial calf fasciotomy 3.  Application of negative pressure wound VAC greater right lateral calf fasciotomy  -pt seen in pacu and he continues to have brisk PT doppler signals bilaterally -continue heparin gtt @ 500U/hr -ok for sips and chips -check PT doppler signals every hour.     Doreatha Massed, PA-C 04/09/2022 12:44 PM [phone]

## 2022-04-09 NOTE — NC FL2 (Signed)
[place] MEDICAID FL2 LEVEL OF CARE SCREENING TOOL     IDENTIFICATION  Patient Name: Jeffrey Fitzgerald Birthdate: 1971/08/01 Sex: male Admission Date (Current Location): 04/01/2022  [facility] and [place] Number:  Producer, television/film/video and Address:  [facility], [address], [place], [place] [address]      Provider Number: [id number]  Attending Physician Name and Address:  Cephus Shelling, MD  Relative Name and Phone Number:       Current Level of Care: Hospital Recommended Level of Care: Skilled Nursing Facility Prior Approval Number:    Date Approved/Denied:   PASRR Number: [id number]  Discharge Plan: SNF    Current Diagnoses: Patient Active Problem List   Diagnosis Date Noted   PAD (peripheral artery disease) (HCC) 04/07/2022   Critical limb ischemia of both lower extremities (HCC) 04/01/2022   Critical limb ischemia of left lower extremity with ulceration of ankle (HCC) 03/30/2022   Iliac artery occlusion, 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 closed Groin,R,Incision closed,thigh,anterior,L,Incision closed,pretibial,L,proximal,Incision closed,pretibial,L,,Incis.closed,leg,R,Incis.closed leg,L,Please see additional info)                       Personal Care Assistance Level of Assistance  Bathing, Feeding, Dressing Bathing Assistance: Maximum assistance Feeding assistance: Independent Dressing Assistance: Maximum assistance      Functional Limitations Info  Sight, Hearing, Speech Sight Info:  (WDL) Hearing Info: Adequate Speech Info: Adequate    SPECIAL CARE FACTORS FREQUENCY  PT (By licensed PT), OT (By licensed OT)     PT Frequency: 5x min weekly OT Frequency: 5x min weekly            Contractures Contractures Info: Not present    Additional Factors Info  Code Status, Allergies Code Status Info: FULL Allergies Info: No Known Allergies           Current Medications (04/09/2022):  This is the current hospital active medication list Current Facility-Administered Medications  Medication Dose Route Frequency Provider Last Rate Last Admin   0.9 %  sodium chloride infusion (Manually program via Guardrails IV Fluids)   Intravenous Once Rhyne, Samantha J, PA-C       0.9 %  sodium chloride infusion  250 mL Intravenous PRN Rhyne, Samantha J, PA-C       0.9 %  sodium chloride infusion  500 mL Intravenous Once PRN Rhyne, Samantha J, PA-C       0.9 %  sodium chloride infusion   Intravenous Continuous 10-16-1987, PA-C 125 mL/hr at 04/09/22 1418 New Bag at 04/09/22 1418   0.9 %  sodium chloride infusion  10 mL/hr Intravenous Once Rhyne, Samantha J, PA-C       0.9 %  sodium chloride infusion  10 mL/hr Intravenous Once Rhyne, Samantha J, PA-C       acetaminophen (TYLENOL) tablet 325-650 mg  325-650 mg Oral Q4H PRN Rhyne, Samantha J, PA-C       Or   acetaminophen (TYLENOL) suppository 325-650 mg  325-650 mg Rectal Q4H  PRN Dara Lords, PA-C       acetaminophen (TYLENOL) tablet 1,000 mg  1,000 mg Oral Once Marcene Duos, MD       amLODipine (NORVASC) tablet 5 mg  5 mg Oral Daily Rhyne, Samantha J, PA-C   5 mg at 04/09/22 1413   aspirin EC tablet 81 mg  81 mg Oral Q0600 Rhyne, Samantha J, PA-C       atorvastatin (LIPITOR) tablet 80 mg  80 mg Oral Daily Rhyne, Samantha J, PA-C   80 mg at 04/09/22 1411   bisacodyl (DULCOLAX) suppository 10 mg  10 mg Rectal Daily PRN Rhyne, Samantha J, PA-C        carvedilol (COREG) tablet 12.5 mg  12.5 mg Oral BID WC Rhyne, Samantha J, PA-C   12.5 mg at 04/09/22 0815   Chlorhexidine Gluconate Cloth 2 % PADS 6 each  6 each Topical Daily Rhyne, Ames Coupe, PA-C   6 each at 04/08/22 0930   docusate sodium (COLACE) capsule 100 mg  100 mg Oral Daily Rhyne, Samantha J, PA-C   100 mg at 04/09/22 1412   heparin ADULT infusion 100 units/mL (25000 units/266mL)  500 Units/hr Intravenous Continuous Rhyne, Samantha J, PA-C 5 mL/hr at 04/09/22 1500 500 Units/hr at 04/09/22 1500   hydrALAZINE (APRESOLINE) injection 5 mg  5 mg Intravenous Q20 Min PRN Rhyne, Samantha J, PA-C       labetalol (NORMODYNE) injection 10 mg  10 mg Intravenous Q10 min PRN Rhyne, Samantha J, PA-C       linezolid (ZYVOX) IVPB 600 mg  600 mg Intravenous Q12H Rhyne, Samantha J, PA-C 0 mL/hr at 04/08/22 2314 600 mg at 04/09/22 1000   metoprolol tartrate (LOPRESSOR) injection 2-5 mg  2-5 mg Intravenous Q2H PRN Rhyne, Samantha J, PA-C       morphine (PF) 4 MG/ML injection 4 mg  4 mg Intravenous Q2H PRN Rhyne, Samantha J, PA-C   4 mg at 04/08/22 0416   ondansetron (ZOFRAN) injection 4 mg  4 mg Intravenous Q6H PRN Rhyne, Samantha J, PA-C       oxyCODONE-acetaminophen (PERCOCET/ROXICET) 5-325 MG per tablet 1-2 tablet  1-2 tablet Oral Q4H PRN Rhyne, Ames Coupe, PA-C   2 tablet at 04/06/22 0955   pantoprazole (PROTONIX) EC tablet 40 mg  40 mg Oral Daily Rhyne, Samantha J, PA-C   40 mg at 04/09/22 1412   phenol (CHLORASEPTIC) mouth spray 1 spray  1 spray Mouth/Throat PRN Rhyne, Samantha J, PA-C       piperacillin-tazobactam (ZOSYN) IVPB 3.375 g  3.375 g Intravenous Q8H Rhyne, Samantha J, PA-C 12.5 mL/hr at 04/09/22 0800 Infusion Verify at 04/09/22 0800   polyethylene glycol (MIRALAX / GLYCOLAX) packet 17 g  17 g Oral Daily PRN Rhyne, Samantha J, PA-C       potassium chloride SA (KLOR-CON M) CR tablet 20-40 mEq  20-40 mEq Oral Daily PRN Rhyne, Samantha J, PA-C       sodium chloride flush (NS) 0.9 % injection 3 mL   3 mL Intravenous Q12H Rhyne, Samantha J, PA-C   3 mL at 04/08/22 2206   sodium chloride flush (NS) 0.9 % injection 3 mL  3 mL Intravenous PRN Dara Lords, PA-C         Discharge Medications: Please see discharge summary for a list of discharge medications.  Relevant Imaging Results:  Relevant Lab Results:   Additional Information SSN- [id number], Both Covid Vaccines,Compression wrap,clean,dry,intact,dressing in place,Incision closed,leg,R,gauze,clean,dry,intact,dressing in place,wound incision open or dehiced non-pressure,wound,ankle,anterior,L,lateral,clean,dry,intact,dressing  in place,wound incision open or dehiced non-pressure,wound toe,L,black,negative pressure wound therapy,thigh,anterior,proximal,R,Negative pressure wound therapy pretibial,negative pressure wound therapy,tibial,R,dressing in place  Delilah Shan, [place]

## 2022-04-09 NOTE — Anesthesia Preprocedure Evaluation (Signed)
Anesthesia Evaluation  Patient identified by MRN, date of birth, ID band Patient awake    Reviewed: Allergy & Precautions, H&P , NPO status , Patient's Chart, lab work & pertinent test results, reviewed documented beta blocker date and time   Airway Mallampati: II  TM Distance: >3 FB Neck ROM: Full    Dental no notable dental hx. (+) Teeth Intact, Dental Advisory Given   Pulmonary Current Smoker and Patient abstained from smoking.,    Pulmonary exam normal breath sounds clear to auscultation       Cardiovascular hypertension, Pt. on medications + Peripheral Vascular Disease   Rhythm:Regular Rate:Normal     Neuro/Psych negative neurological ROS  negative psych ROS   GI/Hepatic negative GI ROS, Neg liver ROS,   Endo/Other  negative endocrine ROS  Renal/GU negative Renal ROS  negative genitourinary   Musculoskeletal   Abdominal Normal abdominal exam  (+)   Peds  Hematology negative hematology ROS (+)   Anesthesia Other Findings   Reproductive/Obstetrics negative OB ROS                             Anesthesia Physical  Anesthesia Plan  ASA: 3  Anesthesia Plan: General   Post-op Pain Management: Ofirmev IV (intra-op)*   Induction: Intravenous  PONV Risk Score and Plan: 2 and Ondansetron, Dexamethasone and Midazolam  Airway Management Planned: LMA  Additional Equipment: None  Intra-op Plan:   Post-operative Plan: Extubation in OR  Informed Consent: I have reviewed the patients History and Physical, chart, labs and discussed the procedure including the risks, benefits and alternatives for the proposed anesthesia with the patient or authorized representative who has indicated his/her understanding and acceptance.     Dental advisory given  Plan Discussed with: CRNA  Anesthesia Plan Comments:         Anesthesia Quick Evaluation

## 2022-04-09 NOTE — Progress Notes (Signed)
PT Cancellation Note  Patient Details Name: Jeffrey Fitzgerald MRN: [id number] DOB: May 21, 1971   Cancelled Treatment:    Reason Eval/Treat Not Completed: Patient not medically ready (pt without pulses LLE and pending possible OR.)   [name] 04/09/2022, 7:31 AM Merryl Hacker, PT Acute Rehabilitation Services Office: [phone]

## 2022-04-10 ENCOUNTER — Encounter (HOSPITAL_COMMUNITY): Payer: Self-pay | Admitting: Vascular Surgery

## 2022-04-10 DIAGNOSIS — D6489 Other specified anemias: Secondary | ICD-10-CM

## 2022-04-10 DIAGNOSIS — D649 Anemia, unspecified: Secondary | ICD-10-CM | POA: Diagnosis present

## 2022-04-10 DIAGNOSIS — N179 Acute kidney failure, unspecified: Secondary | ICD-10-CM

## 2022-04-10 LAB — BASIC METABOLIC PANEL
Anion gap: 8 (ref 5–15)
Anion gap: 9 (ref 5–15)
BUN: 49 mg/dL — ABNORMAL HIGH (ref 6–20)
BUN: 49 mg/dL — ABNORMAL HIGH (ref 6–20)
CO2: 18 mmol/L — ABNORMAL LOW (ref 22–32)
CO2: 18 mmol/L — ABNORMAL LOW (ref 22–32)
Calcium: 6.9 mg/dL — ABNORMAL LOW (ref 8.9–10.3)
Calcium: 7.5 mg/dL — ABNORMAL LOW (ref 8.9–10.3)
Chloride: 115 mmol/L — ABNORMAL HIGH (ref 98–111)
Chloride: 115 mmol/L — ABNORMAL HIGH (ref 98–111)
Creatinine, Ser: 3.33 mg/dL — ABNORMAL HIGH (ref 0.61–1.24)
Creatinine, Ser: 3.38 mg/dL — ABNORMAL HIGH (ref 0.61–1.24)
GFR, Estimated: 22 mL/min — ABNORMAL LOW (ref >60)
GFR, Estimated: 21 mL/min — ABNORMAL LOW (ref >60)
Glucose, Bld: 97 mg/dL (ref 70–99)
Glucose, Bld: 130 mg/dL — ABNORMAL HIGH (ref 70–99)
Potassium: 4.2 mmol/L (ref 3.5–5.1)
Potassium: 3.9 mmol/L (ref 3.5–5.1)
Sodium: 141 mmol/L (ref 135–145)
Sodium: 142 mmol/L (ref 135–145)

## 2022-04-10 LAB — HEMOGLOBIN AND HEMATOCRIT, BLOOD
HCT: 17.7 % — ABNORMAL LOW (ref 39.0–52.0)
HCT: 24.8 % — ABNORMAL LOW (ref 39.0–52.0)
HCT: 24.7 % — ABNORMAL LOW (ref 39.0–52.0)
Hemoglobin: 5.9 g/dL — CL (ref 13.0–17.0)
Hemoglobin: 8.7 g/dL — ABNORMAL LOW (ref 13.0–17.0)
Hemoglobin: 8.5 g/dL — ABNORMAL LOW (ref 13.0–17.0)

## 2022-04-10 LAB — CBC
HCT: 17.5 % — ABNORMAL LOW (ref 39.0–52.0)
HCT: 24.2 % — ABNORMAL LOW (ref 39.0–52.0)
Hemoglobin: 6.1 g/dL — CL (ref 13.0–17.0)
Hemoglobin: 8.5 g/dL — ABNORMAL LOW (ref 13.0–17.0)
MCH: 30.7 pg (ref 26.0–34.0)
MCH: 29.7 pg (ref 26.0–34.0)
MCHC: 34.9 g/dL (ref 30.0–36.0)
MCHC: 35.1 g/dL (ref 30.0–36.0)
MCV: 87.9 fL (ref 80.0–100.0)
MCV: 84.6 fL (ref 80.0–100.0)
Platelets: 103 10*3/uL — ABNORMAL LOW (ref 150–400)
Platelets: 114 10*3/uL — ABNORMAL LOW (ref 150–400)
RBC: 1.99 MIL/uL — ABNORMAL LOW (ref 4.22–5.81)
RBC: 2.86 MIL/uL — ABNORMAL LOW (ref 4.22–5.81)
RDW: 15.6 % — ABNORMAL HIGH (ref 11.5–15.5)
RDW: 15.3 % (ref 11.5–15.5)
WBC: 12.7 10*3/uL — ABNORMAL HIGH (ref 4.0–10.5)
WBC: 13 10*3/uL — ABNORMAL HIGH (ref 4.0–10.5)
nRBC: 0.3 % — ABNORMAL HIGH (ref 0.0–0.2)
nRBC: 0.7 % — ABNORMAL HIGH (ref 0.0–0.2)

## 2022-04-10 LAB — HEPARIN LEVEL (UNFRACTIONATED)
Heparin Unfractionated: <0.1 IU/mL — ABNORMAL LOW (ref 0.30–0.70)
Heparin Unfractionated: <0.1 IU/mL — ABNORMAL LOW (ref 0.30–0.70)

## 2022-04-10 MED ORDER — SODIUM CHLORIDE 0.9% IV SOLUTION: Freq: Once | INTRAVENOUS | Status: AC

## 2022-04-10 MED ORDER — CALCIUM GLUCONATE-NACL 2-0.675 GM/100ML-% IV SOLN
2.0000 g | Freq: Once | INTRAVENOUS | Status: AC
  Administered 2022-04-10: 2000 mg via INTRAVENOUS
  Filled 2022-04-10: qty 100

## 2022-04-10 NOTE — Consult Note (Signed)
NAMEEliaz Fitzgerald, MRN:  [id number], DOB:  [birth year], LOS: 9 ADMISSION DATE:  04/01/2022, CONSULTATION DATE:  04/10/22 REFERRING MD:  Sherral Hammers - [facility] , CHIEF COMPLAINT:  acute kidney injury   History of Present Illness:  51 yo M admitted 8/3 with critical limb ischemia LLE and went to the OR 8/3 for aortobifem bypass, bilateral common femoral endarterectomy w profundoplasty, L common fem to pTib bypass on 8/9. Post op had R foot pain and loss of signals, and went for take back 8/9 for R common femoral artery thrombectomy, endarterectomy R profunda + bovine patch, and revision RLE aortibifem bypass.  8/10 RLE no doppler signal, loss of motor and sensory fxn, back to OR 8/10 for R fem distal bypass with RLE 4x fasciotomy and R posterior tib artery thrombectomy.  8/11 no signals L foot, loss of motor in L foot/ankle. Back to OR 8/11 for thrombectomy L common fem to pTib bypass, RLE fasciotomy closure + wound vac.  EBL 250  8/12 pt with progressive Cr rise and hgb decline.   PCCM consulted in this setting   Pertinent  Medical History  Critical limb ischemia   Significant Hospital Events: Including procedures, antibiotic start and stop dates in addition to other pertinent events   8/3 admitted 8/9 OR aortobifem bypass. Take back for R common fem thrombectomy, endartarectomy, revision RLE aortobifem bypass 8/10 take back R fem distal bypass, RLE 4 compartment fasciotomy 8/11 take back thrombectomy L common fem - PT bypass, RLE fasciotomy closure  8/12 Cr rise to 3.3 Hgb drop to 5.9 PCCM consult   Interim History / Subjective:  Hgb dropped overnight from 9.8 to 5.9  Cr rise to 3.3 with about in foley bag this shift  HDS  Mild pain    Objective   Blood pressure (!) 120/109, pulse (!) 103, temperature 98.1 F (36.7 C), resp. rate 12, height 5\' 5"  (1.651 m), weight 68.8 kg, SpO2 97 %.        Intake/Output Summary (Last 24 hours) at 04/10/2022 0911 Last data filed at 04/10/2022  0836 Gross per 24 hour  Intake 3022.01 ml  Output 1605 ml  Net 1417.01 ml   Filed Weights   04/08/22 1121 04/09/22 0600 04/09/22 0830  Weight: 65.8 kg 68.8 kg 68.8 kg    Examination: General: chronically ill middle aged M NAD  HENT: NCAT RIJ central access  Lungs: CTAb on RA  Cardiovascular: tachycardic, regular Abdomen: soft ndnt + bowel sounds x4 Extremities: BLE warm, + dp signals. R calf wound vac, R groin wound vac.  Neuro: AAOx3  GU: foley, yellow urine with some pink sediment   Resolved Hospital Problem list     Assessment & Plan:   LLE Critical limb ischemia s/p aortobifem bypass, revision R aortobifem bypass + RLE thrombectomy + endarterectomy, R fem to posterior tib bypass + R pTib thrombectomy + RLE 4 fasciotomy, LLE thrombectomy and RLE fasciotomy closure P -per vvs -cont hep gtt  -zyvox, zosyn   ABLA + component of dilution Thrombocytopenia  -8/11 EBL 250  -8/10 EBL 650 ml (+ 4 PRBC 1 plt 2 FFP during case)  -8/9 (1st case)  EBL 3L (+ 700 cellsaver 3 PRBC during case)  -8/9 (take back) EBL 11-25-1990 (+ 1 FFP 2 PRBC during case) -hgb dropped 8/11-12 from 9.8 to 5.9  P -2 PRBC this morning -post transfusion H/H, + serial  -serial abd and site exams -- currently soft abd and benign op sites  -will  give 2g cal glu -- has had a lot of product this admission-- recheck an ical with H/H above   AKI  -in setting of above -fortunately UOP is ok, hopefully we are approaching a plateau.  P -close monitoring -- hope is for re equilibration without measures like RRT  -hourly UOP -will dc NS -cont foley -minimize nephrotoxic agents as able   Acute liver injury -hypoperfusion in setting of above  P -trend LFTs, coags     Best Practice (right click and "Reselect all SmartList Selections" daily)   Diet/type: NPO DVT prophylaxis: systemic heparin GI prophylaxis: PPI Lines: Central line Foley:  Yes, and it is still needed Code Status:  full code Last date of  multidisciplinary goals of care discussion [--]  Labs   CBC: Recent Labs  Lab 04/07/22 0258 04/07/22 1021 04/07/22 1540 04/07/22 1753 04/07/22 2350 04/08/22 1314 04/08/22 1349 04/08/22 1444 04/09/22 0009 04/10/22 0521 04/10/22 0554  WBC 6.9  --  12.8*  --  12.8*  --   --   --  12.4* 12.7*  --   NEUTROABS  --   --   --   --   --   --   --   --  8.6*  --   --   HGB 13.1   < > 13.5   < > 14.2   < > 7.8* 8.2* 9.8* 6.1* 5.9*  HCT 38.8*   < > 39.9   < > 39.5   < > 23.0* 24.0* 27.0* 17.5* 17.7*  MCV 84.9  --  88.3  --  84.0  --   --   --  84.1 87.9  --   PLT 235  --  PLATELET CLUMPS NOTED ON SMEAR, UNABLE TO ESTIMATE  --  65*  --   --   --  104* 103*  --    < > = values in this interval not displayed.    Basic Metabolic Panel: Recent Labs  Lab 04/07/22 1540 04/07/22 1753 04/07/22 2350 04/08/22 1314 04/08/22 1349 04/08/22 1444 04/09/22 0009 04/09/22 0625 04/10/22 0521  NA 138   < > 140   < > 142 142 138 138 141  K 4.0   < > 4.0   < > 3.6 4.2 4.0 4.0 4.2  CL 112*  --  113*  --   --   --  113* 111 115*  CO2 19*  --  19*  --   --   --  20* 21* 18*  GLUCOSE 201*  --  145*  --   --   --  128* 107* 97  BUN 12  --  17  --   --   --  38* 42* 49*  CREATININE 1.11  --  1.51*  --   --   --  2.67* 2.98* 3.33*  CALCIUM 6.9*  --  7.8*  --   --   --  6.9* 7.0* 6.9*  MG 1.4*  --  1.3*  --   --   --  2.2  --   --    < > = values in this interval not displayed.   GFR: Estimated Creatinine Clearance: 23.1 mL/min (A) (by C-G formula based on SCr of 3.33 mg/dL (H)). Recent Labs  Lab 04/07/22 1540 04/07/22 2350 04/09/22 0009 04/10/22 0521  WBC 12.8* 12.8* 12.4* 12.7*    Liver Function Tests: Recent Labs  Lab 04/07/22 1540 04/07/22 2350 04/09/22 0009  AST 39 66* 228*  ALT 26 39 55*  ALKPHOS 23* 26* 23*  BILITOT 2.0* 2.4* 1.1  PROT 3.9* 4.4* 4.0*  ALBUMIN 2.3* 2.7* 2.4*   Recent Labs  Lab 04/07/22 2350  AMYLASE 58   No results for input(s): "AMMONIA" in the last 168  hours.  ABG    Component Value Date/Time   PHART 7.315 (L) 04/08/2022 1444   PCO2ART 38.8 04/08/2022 1444   PO2ART 159 (H) 04/08/2022 1444   HCO3 20.1 04/08/2022 1444   TCO2 21 (L) 04/08/2022 1444   ACIDBASEDEF 6.0 (H) 04/08/2022 1444   O2SAT 99 04/08/2022 1444     Coagulation Profile: Recent Labs  Lab 04/07/22 1540  INR 1.5*    Cardiac Enzymes: No results for input(s): "CKTOTAL", "CKMB", "CKMBINDEX", "TROPONINI" in the last 168 hours.  HbA1C: Hgb A1c MFr Bld  Date/Time Value Ref Range Status  04/02/2022 03:04 AM 5.6 4.8 - 5.6 % Final    Comment:    (NOTE) Pre diabetes:          5.7%-6.4%  Diabetes:              >6.4%  Glycemic control for   <7.0% adults with diabetes     CBG: Recent Labs  Lab 04/07/22 2233 04/08/22 1727  GLUCAP 143* 104*    Review of Systems:   Review of Systems  Constitutional:  Positive for malaise/fatigue.  HENT: Negative.    Eyes: Negative.   Respiratory: Negative.    Cardiovascular: Negative.   Gastrointestinal:  Positive for nausea. Negative for abdominal pain, blood in stool, melena and vomiting.  Genitourinary: Negative.   Musculoskeletal:  Positive for back pain.  Skin: Negative.   Neurological: Negative.   Endo/Heme/Allergies: Negative.   Psychiatric/Behavioral: Negative.       Past Medical History:  He,  has a past medical history of Hypertension, Peripheral vascular disease (HCC), and Tobacco abuse.   Surgical History:   Past Surgical History:  Procedure Laterality Date   ABDOMINAL AORTOGRAM W/LOWER EXTREMITY N/A 04/01/2022   Procedure: ABDOMINAL AORTOGRAM LOWER EXTREMITY [place];  Surgeon: Cephus Shelling, MD;  Location: [facility];  Service: Cardiovascular;  Laterality: N/A;   AORTA - BILATERAL FEMORAL ARTERY BYPASS GRAFT Bilateral 04/07/2022   Procedure: AORTA BIFEMORAL BYPASS WITH LEFT LEG BYPASS;  Surgeon: Cephus Shelling, MD;  Location: [facility] OR;  Service: Vascular;  Laterality: Bilateral;    APPLICATION OF WOUND VAC Right 04/08/2022   Procedure: APPLICATION OF WOUND VAC;  Surgeon: Leonie Douglas, MD;  Location: [facility] OR;  Service: Vascular;  Laterality: Right;   FEMORAL-POPLITEAL BYPASS GRAFT Left 04/07/2022   Procedure: LEFT LEG BYPASS GRAFT FEMORAL-POPLITEAL ARTERY;  Surgeon: Cephus Shelling, MD;  Location: [facility] OR;  Service: Vascular;  Laterality: Left;   FEMORAL-TIBIAL BYPASS GRAFT Right 04/08/2022   Procedure: GREATER SAPHENOUS VEIN HARVEST, RIGHT LEG BYPASS GRAFT RIGHT COMMON FEMORAL-TIBIAL ARTERY WITH COMPOSITE GRAFT;  Surgeon: Leonie Douglas, MD;  Location: [facility] OR;  Service: Vascular;  Laterality: Right;   LOWER EXTREMITY ANGIOGRAM Right 04/08/2022   Procedure: LOWER EXTREMITY ANGIOGRAM;  Surgeon: Leonie Douglas, MD;  Location: [facility] OR;  Service: Vascular;  Laterality: Right;   PATCH ANGIOPLASTY Right 04/07/2022   Procedure: PATCH ANGIOPLASTY USING Livia Snellen BIOLOGIC;  Surgeon: Cephus Shelling, MD;  Location: [facility] OR;  Service: Vascular;  Laterality: Right;   THIGH FASCIOTOMY Right 04/08/2022   Procedure: FASCIOTOMIES;  Surgeon: Leonie Douglas, MD;  Location: [facility] OR;  Service: Vascular;  Laterality: Right;   THROMBECTOMY  FEMORAL ARTERY Right 04/07/2022   Procedure: RIGHT FEMORAL ARTERY THROMBECTOMY;  Surgeon: Cephus Shelling, MD;  Location: [facility] OR;  Service: Vascular;  Laterality: Right;   THROMBECTOMY FEMORAL ARTERY Right 04/08/2022   Procedure: THROMBECTOMY POSTERIOR TIBIAL;  Surgeon: Leonie Douglas, MD;  Location: [facility] OR;  Service: Vascular;  Laterality: Right;   THROMBECTOMY FEMORAL ARTERY Bilateral 04/09/2022   Procedure: LEFT  THROMBECTOMY FEMORAL POSTERIOR TIBIAL BYPASS AND RIGHT LATERAL  LOWER EXTREMITY FASCIOTOMY CLOSURE  AND WOUND VAC PLACEMENT;  Surgeon: Cephus Shelling, MD;  Location: [facility] OR;  Service: Vascular;  Laterality: Bilateral;     Social History:   reports that he has been smoking cigarettes. He has a 15.00 pack-year smoking history. He has never used  smokeless tobacco. He reports that he does not currently use alcohol. He reports current drug use. Drug: Marijuana.   Family History:  His family history includes Heart disease in his father; Hypertension in his father.   Allergies No Known Allergies   Home Medications  Prior to Admission medications   Medication Sig Start Date End Date Taking? Authorizing Provider  acetaminophen (TYLENOL) 500 MG tablet Take 500 mg by mouth every 6 (six) hours as needed for moderate pain.   Yes [provider]  ibuprofen (ADVIL) 200 MG tablet Take 200 mg by mouth every 6 (six) hours as needed for moderate pain.   Yes [provider]     Critical care time: 50 minutes       CRITICAL CARE Performed by: Lanier Clam   Total critical care time: 50 minutes  Critical care time was exclusive of separately billable procedures and treating other patients. Critical care was necessary to treat or prevent imminent or life-threatening deterioration.  Critical care was time spent personally by me on the following activities: development of treatment plan with patient and/or surrogate as well as nursing, discussions with consultants, evaluation of patient's response to treatment, examination of patient, obtaining history from patient or surrogate, ordering and performing treatments and interventions, ordering and review of laboratory studies, ordering and review of radiographic studies, pulse oximetry and re-evaluation of patient's condition.  Tessie Fass MSN, AGACNP-BC [place] [facility] Amion for pager  04/10/2022, 9:11 AM

## 2022-04-10 NOTE — Progress Notes (Signed)
Cardiology Progress Note  Patient ID: Jeffrey Fitzgerald MRN: [id number] DOB: 1971/06/08 Date of Encounter: 04/10/2022  Primary Cardiologist: Minus Breeding, MD  Subjective   Chief Complaint: Leg pain  HPI: Hemoglobin dropped overnight.  Urine output declining.  Creatinine rising.  Blood pressure remained stable.  ROS:  All other ROS reviewed and negative. Pertinent positives noted in the HPI.     Inpatient Medications  Scheduled Meds:  sodium chloride   Intravenous Once   sodium chloride   Intravenous Once   acetaminophen  1,000 mg Oral Once   amLODipine  5 mg Oral Daily   aspirin EC  81 mg Oral Q0600   atorvastatin  80 mg Oral Daily   Chlorhexidine Gluconate Cloth  6 each Topical Daily   docusate sodium  100 mg Oral Daily   pantoprazole  40 mg Oral Daily   sodium chloride flush  3 mL Intravenous Q12H   Continuous Infusions:  sodium chloride     sodium chloride     sodium chloride 125 mL/hr at 04/09/22 1418   sodium chloride     sodium chloride     heparin 500 Units/hr (04/10/22 0700)   linezolid (ZYVOX) IV Stopped (04/09/22 2208)   piperacillin-tazobactam (ZOSYN)  IV Stopped (04/10/22 0510)   PRN Meds: sodium chloride, sodium chloride, acetaminophen **OR** acetaminophen, bisacodyl, hydrALAZINE, labetalol, metoprolol tartrate, morphine injection, ondansetron, oxyCODONE-acetaminophen, phenol, polyethylene glycol, potassium chloride, sodium chloride flush   Vital Signs   Vitals:   04/10/22 0645 04/10/22 0655 04/10/22 0700 04/10/22 0800  BP: 138/78 116/67 (!) 130/90   Pulse: 100 (!) 105 95   Resp: 15 17 (!) 21   Temp: 98.5 F (36.9 C) 98 F (36.7 C)  98.5 F (36.9 C)  TempSrc: Oral Oral  Axillary  SpO2: 95% 94% 95%   Weight:      Height:        Intake/Output Summary (Last 24 hours) at 04/10/2022 0802 Last data filed at 04/10/2022 0700 Gross per 24 hour  Intake 2662.71 ml  Output 1605 ml  Net 1057.71 ml      04/09/2022    8:30 AM 04/09/2022    6:00 AM  04/08/2022   11:21 AM  Last 3 Weights  Weight (lbs) 151 lb 10.8 oz 151 lb 10.8 oz 145 lb  Weight (kg) 68.8 kg 68.8 kg 65.772 kg      Telemetry  Overnight telemetry shows sinus tachycardia in the low 100s, which I personally reviewed.   ECG  The most recent ECG shows sinus tachycardia heart rate 110, no acute ischemic changes, which I personally reviewed.   Physical Exam   Vitals:   04/10/22 0645 04/10/22 0655 04/10/22 0700 04/10/22 0800  BP: 138/78 116/67 (!) 130/90   Pulse: 100 (!) 105 95   Resp: 15 17 (!) 21   Temp: 98.5 F (36.9 C) 98 F (36.7 C)  98.5 F (36.9 C)  TempSrc: Oral Oral  Axillary  SpO2: 95% 94% 95%   Weight:      Height:        Intake/Output Summary (Last 24 hours) at 04/10/2022 0802 Last data filed at 04/10/2022 0700 Gross per 24 hour  Intake 2662.71 ml  Output 1605 ml  Net 1057.71 ml       04/09/2022    8:30 AM 04/09/2022    6:00 AM 04/08/2022   11:21 AM  Last 3 Weights  Weight (lbs) 151 lb 10.8 oz 151 lb 10.8 oz 145 lb  Weight (kg) 68.8 kg  68.8 kg 65.772 kg    Body mass index is 25.24 kg/m.   General: Ill-appearing Head: Atraumatic, normal size  Eyes: PEERLA, EOMI  Neck: Supple, JVD 10-12 cmH2O Endocrine: No thryomegaly Cardiac: Normal S1, S2; RRR; no murmurs, rubs, or gallops Lungs: Breath sounds bilaterally Abd: Soft, nontender, no hepatomegaly  Ext: 2+ pitting edema, surgical dressings noted, diminished pulses in the lower extremities Musculoskeletal: No deformities, BUE and BLE strength normal and equal Skin: Surgical dressings noted Neuro: Alert, awake, oriented to person place and time  Labs  High Sensitivity Troponin:  No results for input(s): "TROPONINIHS" in the last 720 hours.   Cardiac EnzymesNo results for input(s): "TROPONINI" in the last 168 hours. No results for input(s): "TROPIPOC" in the last 168 hours.  Chemistry Recent Labs  Lab 04/07/22 1540 04/07/22 1753 04/07/22 2350 04/08/22 1314 04/09/22 0009 04/09/22 0625  04/10/22 0521  NA 138   < > 140   < > 138 138 141  K 4.0   < > 4.0   < > 4.0 4.0 4.2  CL 112*  --  113*  --  113* 111 115*  CO2 19*  --  19*  --  20* 21* 18*  GLUCOSE 201*  --  145*  --  128* 107* 97  BUN 12  --  17  --  38* 42* 49*  CREATININE 1.11  --  1.51*  --  2.67* 2.98* 3.33*  CALCIUM 6.9*  --  7.8*  --  6.9* 7.0* 6.9*  PROT 3.9*  --  4.4*  --  4.0*  --   --   ALBUMIN 2.3*  --  2.7*  --  2.4*  --   --   AST 39  --  66*  --  228*  --   --   ALT 26  --  39  --  55*  --   --   ALKPHOS 23*  --  26*  --  23*  --   --   BILITOT 2.0*  --  2.4*  --  1.1  --   --   GFRNONAA >60  --  56*  --  28* 25* 22*  ANIONGAP 7  --  8  --  5 6 8    < > = values in this interval not displayed.    Hematology Recent Labs  Lab 04/07/22 2350 04/08/22 1314 04/09/22 0009 04/10/22 0521 04/10/22 0554  WBC 12.8*  --  12.4* 12.7*  --   RBC 4.70  --  3.21* 1.99*  --   HGB 14.2   < > 9.8* 6.1* 5.9*  HCT 39.5   < > 27.0* 17.5* 17.7*  MCV 84.0  --  84.1 87.9  --   MCH 30.2  --  30.5 30.7  --   MCHC 35.9  --  36.3* 34.9  --   RDW 14.3  --  14.6 15.6*  --   PLT 65*  --  104* 103*  --    < > = values in this interval not displayed.   BNPNo results for input(s): "BNP", "PROBNP" in the last 168 hours.  DDimer No results for input(s): "DDIMER" in the last 168 hours.   Radiology  DG C-Arm 1-60 Min-No Report  Result Date: 04/08/2022 Fluoroscopy was utilized by the requesting physician.  No radiographic interpretation.    Cardiac Studies  TTE 04/02/2022  1. Left ventricular ejection fraction, by estimation, is 60 to 65%. The  left ventricle has normal function.  The left ventricle demonstrates  regional wall motion abnormalities with basal inferior hypokinesis. There  is mild left ventricular hypertrophy.  Left ventricular diastolic parameters are consistent with Grade I  diastolic dysfunction (impaired relaxation).   2. Right ventricular systolic function is normal. The right ventricular  size is  normal. Tricuspid regurgitation signal is inadequate for assessing  PA pressure.   3. The aortic valve is tricuspid. Aortic valve regurgitation is not  visualized. No aortic stenosis is present.   4. The mitral valve is normal in structure. No evidence of mitral valve  regurgitation. No evidence of mitral stenosis.   5. The inferior vena cava is normal in size with greater than 50%  respiratory variability, suggesting right atrial pressure of 3 mmHg.   Patient Profile  Braedon Makris is a 51 y.o. male with hypertension, tobacco abuse, PAD who was admitted on 04/01/2022 with critical limb ischemia.  He is status post aortobifem bypass with bifemoral endarterectomy with left femoral to PT bypass on 04/07/2022.  Course complicated by acute thrombus of the right common femoral artery on 04/07/2022 status post thrombectomy.  Taken back to the OR on 04/08/2022 for right femoral to PT bypass with right lower extremity fasciotomy.  Status post left lower extremity fasciotomy and thrombectomy of the left femoral PT bypass on 04/09/2022.  Cardiology was consulted for hypertension management.  Course is now complicated by acute kidney injury as well as anemia.  Assessment & Plan   #PAD/critical limb ischemia #Postoperative anemia -Status post aortobifemoral bypass, bilateral femoral endarterectomies, left femoral to PT bypass on 04/07/2022. -Course complicated by acute thrombus status post thrombectomy of the right common femoral artery. -Status post right femoral to PT bypass on 04/08/2022.  Also underwent right lower extremity fasciotomy at the same time. -Status post left lower extremity fasciotomy and thrombectomy of the left femoral to PT bypass on 04/09/2022. -Course now complicated by acute kidney injury.  Creatinine is rising.  Urine output is diminished. -He is volume overloaded. -We will discuss his case with critical care medicine and vascular surgery.  We will hold on diuresis given all of his recent  surgeries.  He is now with profound anemia.  This will need to be addressed.   -He may need reexploration. -He is receiving transfusion now. Maintain HGB >7. -Hold antihypertensive agents. -He is on aspirin and heparin.  We will discuss this with vascular surgery.  Giving anemia this is concerning.  #Acute kidney injury -I have requested consult from critical care medicine.  No diuresis in the setting of profound anemia.  He is volume overloaded.  I fear he is heading toward renal replacement therapy. -I will discuss his case with vascular surgery.  #Hypertension -I am holding his carvedilol and amlodipine in setting of profound anemia. Currently hemodynamically stable. No need for pressors. He is receiving fluids.  Volume overloaded.  See discussion above on acute kidney injury.  For now given multiple surgeries and anemia we will allow him to maintain hypervolemic status.  CRITICAL CARE Performed by: Gerri Spore T O'Neal  Total critical care time: 40 minutes. Critical care time was exclusive of separately billable procedures and treating other patients. Critical care was necessary to treat or prevent imminent or life-threatening deterioration. Critical care was time spent personally by me on the following activities: development of treatment plan with patient and/or surrogate as well as nursing, discussions with consultants, evaluation of patient's response to treatment, examination of patient, obtaining history from patient or surrogate, ordering and performing treatments  and interventions, ordering and review of laboratory studies, ordering and review of radiographic studies, pulse oximetry and re-evaluation of patient's condition.  For questions or updates, please contact [facility] Please consult [email or web address] for contact info under     Signed, Gerri Spore T. Flora Lipps, MD, [facility]   [facility]  04/10/2022 8:02 AM

## 2022-04-10 NOTE — TOC CM/SW Note (Addendum)
Message received by LCSW that patient's sister needs to be contacted to discuss home health services. Attempted to reach Kieon Lawhorn (sister) [phone]. No answer. HIPAA compliant voicemail message left requesting return call today by 5 pm as writer will not be in tomorrow. Will have another RNCM covering Sunday.  Addendum 1319 pm: Call received from [place] (sister). Explained home health services process. Explained patient will  use home health charity if he goes home with home health services. Home health agencies rotate weekly for charity cases. [name] has questions about applying for Medicaid, assistance, and for disability. Discussed that it appears in notes, Financial Counselor notification was placed by weekday LCSW.  Made weekend LCSW aware of writer's conversation.  TOC will continue to follow.   Raiford Noble, MSN, RN,BSN Inpatient [facility] Case Manager [phone]

## 2022-04-10 NOTE — Progress Notes (Signed)
  Day of Surgery Note    Subjective: Sitting up comfortably.  No complaints, wants a cigarette.   Vitals:   04/10/22 0826 04/10/22 0830  BP: (!) 150/73 (!) 120/109  Pulse: (!) 108 (!) 103  Resp: 20 12  Temp: 98.1 F (36.7 C)   SpO2:  97%    Incisions:   wound vac to lateral right leg, otherwise bandages in place BLE Extremities:  brisk doppler signals bilateral PT Cardiac:  regular rhythm Abdomen soft, nondistended, nontender Foley catheter in place Edematous     Assessment/Plan:  This is a 51 y.o. male who is s/p  1.  Thrombectomy of left common femoral to posterior tibial artery bypass 2.  Closure of right medial calf fasciotomy 3.  Application of negative pressure wound VAC greater right lateral calf fasciotomy   Overall I am happy with how Maximum looks this morning.   Labs demonstrated precipitous drop in his hematocrit.  I think this is delayed postoperative loss with resuscitation.  I think he was previously previously hemoconcentrated. Abdominal exam benign, no concern for abdominal bleeding, no sign of bleeding in bilateral lower extremities.  We will give blood today and trend. And continue the heparin for the time being, this is subtherapeutic. Labs also demonstrate increase in creatinine.  He continues to make urine.   Appreciate critical care medicine's involvement as he may require dialysis in the future Can get up out of bed today to the chair Physical therapy, Occupational Therapy.  Aggressive pulmonary toilet. We will continue to monitor in the ICU.  -pt seen in pacu and he continues to have brisk PT doppler signals bilaterally -continue heparin gtt @ 500U/hr -ok for sips and chips -check PT doppler signals every hour.    Victorino Sparrow MD 04/10/2022 9:46 AM [phone]

## 2022-04-11 DIAGNOSIS — I70222 Atherosclerosis of native arteries of extremities with rest pain, left leg: Secondary | ICD-10-CM

## 2022-04-11 LAB — BPAM RBC
Blood Product Expiration Date: 202308252359
Blood Product Expiration Date: 202308282359
Blood Product Expiration Date: 202308292359
Blood Product Expiration Date: 202308302359
Blood Product Expiration Date: 202309022359
Blood Product Expiration Date: 202309022359
Blood Product Expiration Date: 202309052359
Blood Product Expiration Date: 202309052359
Blood Product Expiration Date: 202309082359
Blood Product Expiration Date: 202309082359
Blood Product Expiration Date: 202309082359
Blood Product Expiration Date: 202309092359
ISSUE DATE / TIME: 202308090901
ISSUE DATE / TIME: 202308090901
ISSUE DATE / TIME: 202308090901
ISSUE DATE / TIME: 202308091922
ISSUE DATE / TIME: 202308091922
ISSUE DATE / TIME: 202308101401
ISSUE DATE / TIME: 202308101401
ISSUE DATE / TIME: 202308101429
ISSUE DATE / TIME: 202308101429
ISSUE DATE / TIME: 202308120635
ISSUE DATE / TIME: 202308120818
Unit Type and Rh: 6200
Unit Type and Rh: 6200
Unit Type and Rh: 6200
Unit Type and Rh: 6200
Unit Type and Rh: 6200
Unit Type and Rh: 6200
Unit Type and Rh: 6200
Unit Type and Rh: 6200
Unit Type and Rh: 6200
Unit Type and Rh: 6200
Unit Type and Rh: 6200
Unit Type and Rh: 6200

## 2022-04-11 LAB — BASIC METABOLIC PANEL
Anion gap: 8 (ref 5–15)
Anion gap: 9 (ref 5–15)
BUN: 52 mg/dL — ABNORMAL HIGH (ref 6–20)
BUN: 50 mg/dL — ABNORMAL HIGH (ref 6–20)
CO2: 19 mmol/L — ABNORMAL LOW (ref 22–32)
CO2: 20 mmol/L — ABNORMAL LOW (ref 22–32)
Calcium: 7.4 mg/dL — ABNORMAL LOW (ref 8.9–10.3)
Calcium: 7.5 mg/dL — ABNORMAL LOW (ref 8.9–10.3)
Chloride: 113 mmol/L — ABNORMAL HIGH (ref 98–111)
Chloride: 112 mmol/L — ABNORMAL HIGH (ref 98–111)
Creatinine, Ser: 3.52 mg/dL — ABNORMAL HIGH (ref 0.61–1.24)
Creatinine, Ser: 3.59 mg/dL — ABNORMAL HIGH (ref 0.61–1.24)
GFR, Estimated: 20 mL/min — ABNORMAL LOW (ref >60)
GFR, Estimated: 20 mL/min — ABNORMAL LOW (ref >60)
Glucose, Bld: 112 mg/dL — ABNORMAL HIGH (ref 70–99)
Glucose, Bld: 117 mg/dL — ABNORMAL HIGH (ref 70–99)
Potassium: 3.6 mmol/L (ref 3.5–5.1)
Potassium: 3.8 mmol/L (ref 3.5–5.1)
Sodium: 140 mmol/L (ref 135–145)
Sodium: 141 mmol/L (ref 135–145)

## 2022-04-11 LAB — HEPATIC FUNCTION PANEL
ALT: 69 U/L — ABNORMAL HIGH (ref 0–44)
AST: 298 U/L — ABNORMAL HIGH (ref 15–41)
Albumin: 2 g/dL — ABNORMAL LOW (ref 3.5–5.0)
Alkaline Phosphatase: 39 U/L (ref 38–126)
Bilirubin, Direct: 0.2 mg/dL (ref 0.0–0.2)
Indirect Bilirubin: 0.6 mg/dL (ref 0.3–0.9)
Total Bilirubin: 0.8 mg/dL (ref 0.3–1.2)
Total Protein: 4.6 g/dL — ABNORMAL LOW (ref 6.5–8.1)

## 2022-04-11 LAB — TYPE AND SCREEN
ABO/RH(D): A POS
Antibody Screen: NEGATIVE
Unit division: 0
Unit division: 0
Unit division: 0
Unit division: 0
Unit division: 0
Unit division: 0
Unit division: 0
Unit division: 0
Unit division: 0
Unit division: 0
Unit division: 0
Unit division: 0

## 2022-04-11 LAB — PROTIME-INR
INR: 1.2 (ref 0.8–1.2)
Prothrombin Time: 15.1 seconds (ref 11.4–15.2)

## 2022-04-11 LAB — CBC
HCT: 22.8 % — ABNORMAL LOW (ref 39.0–52.0)
HCT: 20.3 % — ABNORMAL LOW (ref 39.0–52.0)
Hemoglobin: 7.7 g/dL — ABNORMAL LOW (ref 13.0–17.0)
Hemoglobin: 7.1 g/dL — ABNORMAL LOW (ref 13.0–17.0)
MCH: 29.8 pg (ref 26.0–34.0)
MCH: 30.6 pg (ref 26.0–34.0)
MCHC: 33.8 g/dL (ref 30.0–36.0)
MCHC: 35 g/dL (ref 30.0–36.0)
MCV: 88.4 fL (ref 80.0–100.0)
MCV: 87.5 fL (ref 80.0–100.0)
Platelets: 125 10*3/uL — ABNORMAL LOW (ref 150–400)
Platelets: 137 10*3/uL — ABNORMAL LOW (ref 150–400)
RBC: 2.58 MIL/uL — ABNORMAL LOW (ref 4.22–5.81)
RBC: 2.32 MIL/uL — ABNORMAL LOW (ref 4.22–5.81)
RDW: 15.4 % (ref 11.5–15.5)
RDW: 15.7 % — ABNORMAL HIGH (ref 11.5–15.5)
WBC: 12.5 10*3/uL — ABNORMAL HIGH (ref 4.0–10.5)
WBC: 13.5 10*3/uL — ABNORMAL HIGH (ref 4.0–10.5)
nRBC: 0.6 % — ABNORMAL HIGH (ref 0.0–0.2)
nRBC: 0.8 % — ABNORMAL HIGH (ref 0.0–0.2)

## 2022-04-11 LAB — HEPARIN LEVEL (UNFRACTIONATED): Heparin Unfractionated: <0.1 IU/mL — ABNORMAL LOW (ref 0.30–0.70)

## 2022-04-11 NOTE — Progress Notes (Signed)
  Day of Surgery Note    Subjective: Sitting up comfortably.  No complaints   Vitals:   04/11/22 0800 04/11/22 0900  BP: (!) 142/67   Pulse: (!) 107   Resp: 17 16  Temp: 98.6 F (37 C)   SpO2: 100%     Incisions:   wound vac to lateral right leg, otherwise bandages in place BLE Extremities:  brisk doppler signals bilateral PT Cardiac:  regular rhythm Abdomen soft, nondistended, nontender, midline laparotomy incision dry Foley catheter in place Edematous     Assessment/Plan:  This is a 51 y.o. male who is s/p  1.  Thrombectomy of left common femoral to posterior tibial artery bypass 2.  Closure of right medial calf fasciotomy 3.  Application of negative pressure wound VAC greater right lateral calf fasciotomy   Overall I am happy with how Jeffrey Fitzgerald looks this morning.   Signals in the feet Hct stable Creatinine appears to be plateauing, leave central line in for another day in case this needs to be changed to a temporary TDC Continue heparin PT OT We will discuss timing of fasciotomy closure tomorrow with Dr. Chestine Spore Regular diet Foley out with strict I's and O's     Victorino Sparrow MD 04/11/2022 9:46 AM [phone]

## 2022-04-11 NOTE — Progress Notes (Signed)
NAMEMacallister Fitzgerald, MRN:  [id number], DOB:  1970/12/04, LOS: 10 ADMISSION DATE:  04/01/2022, CONSULTATION DATE:  04/10/22 REFERRING MD:  Sherral Hammers - [facility] , CHIEF COMPLAINT:  acute kidney injury   History of Present Illness:  51 yo M admitted 8/3 with critical limb ischemia LLE and went to the OR 8/3 for aortobifem bypass, bilateral common femoral endarterectomy w profundoplasty, L common fem to pTib bypass on 8/9. Post op had R foot pain and loss of signals, and went for take back 8/9 for R common femoral artery thrombectomy, endarterectomy R profunda + bovine patch, and revision RLE aortibifem bypass.  8/10 RLE no doppler signal, loss of motor and sensory fxn, back to OR 8/10 for R fem distal bypass with RLE 4x fasciotomy and R posterior tib artery thrombectomy.  8/11 no signals L foot, loss of motor in L foot/ankle. Back to OR 8/11 for thrombectomy L common fem to pTib bypass, RLE fasciotomy closure + wound vac.  EBL 250  8/12 pt with progressive Cr rise and hgb decline.   PCCM consulted in this setting   Pertinent  Medical History  Critical limb ischemia   Significant Hospital Events: Including procedures, antibiotic start and stop dates in addition to other pertinent events   8/3 admitted 8/9 OR aortobifem bypass. Take back for R common fem thrombectomy, endartarectomy, revision RLE aortobifem bypass 8/10 take back R fem distal bypass, RLE 4 compartment fasciotomy 8/11 take back thrombectomy L common fem - PT bypass, RLE fasciotomy closure  8/12 Cr rise to 3.3 Hgb drop to 5.9 PCCM consult  8/13 making good urine, still slight Cr incr.   Interim History / Subjective:   Cr still incr -- now 3.52 from 3.38-- but slowing, think close to plateau Hgb responded appropriately to 2 PRBC yesterday, has since slightly drifted to 7.7   LFTs grossly stable -- very slight incr from prior    Feels good today. Some pain R foot.   Objective   Blood pressure (!) 142/67, pulse (!) 107,  temperature 98.6 F (37 C), temperature source Oral, resp. rate 16, height 5\' 5"  (1.651 m), weight 68.8 kg, SpO2 100 %.        Intake/Output Summary (Last 24 hours) at 04/11/2022 1037 Last data filed at 04/11/2022 0600 Gross per 24 hour  Intake 996.91 ml  Output 1375 ml  Net -378.09 ml   Filed Weights   04/08/22 1121 04/09/22 0600 04/09/22 0830  Weight: 65.8 kg 68.8 kg 68.8 kg    Examination: General: chronically ill WDWN middle aged M HENT: NCAT pink mm  Lungs: CTAb even unlabored on RA  Cardiovascular: RRR s1s2 cap refill < 3 sec   Abdomen: soft ndnt  Extremities: R thigh wound vac, R calf wound vac. BLE dressings in place. +doppler pt and dp pulses Neuro: AAOx3 following commands  GU: foley, yellow urine   Resolved Hospital Problem list     Assessment & Plan:   Critical Limb Ishchemia s/p aortobifem bypass, revision R aortobifem bypass + RLE thrombectomy + endarterectomy, R fem to posterior tib bypass + R pTib thrombectomy + RLE 4 fasciotomy, LLE thrombectomy and RLE fasciotomy closure P -anticipate 10-14d course abx -- zyvox + zosyn -- per [facility]  -fq vasc checks  -cont hep gtt   ABLA Thrombocytopenia  -8/11 EBL 250  -8/10 EBL 650 ml (+ 4 PRBC 1 plt 2 FFP during case)  -8/9 (1st case)  EBL 3L (+ 700 cellsaver 3 PRBC during case)  -  8/9 (take back) EBL (+ 1 FFP 2 PRBC during case) - think that hgb drop 8/12 was reflective of above losses + hemodilution. Responded well to 2 PRBC. Slight drift 8/13 but no findings concerning for bleed  P -BID CBC  -serial abd exams   AKI NAGMA -in setting of above P -strict I/O -minimize nephrotoxins as able -trend renal indices -- hoping we are nearing a plateau  -acidosis not needing correction at time but could consider an amp of bicarb if worsens -slowly incr PO intake as tolerated  -keeping Central access 1 more day to ensure won't need RRT (if he does, could change over)  Transaminitis  -hypoperfusion in setting of  above  P -trend LFTs, coags     Best Practice (right click and "Reselect all SmartList Selections" daily)   Diet/type: Regular consistency (see orders) DVT prophylaxis: systemic heparin GI prophylaxis: PPI Lines: Central line Foley:  Yes, and it is still needed Code Status:  full code Last date of multidisciplinary goals of care discussion [--]  Labs   CBC: Recent Labs  Lab 04/07/22 2350 04/08/22 1314 04/09/22 0009 04/10/22 0521 04/10/22 0554 04/10/22 1349 04/10/22 1715 04/10/22 2108 04/11/22 0437  WBC 12.8*  --  12.4* 12.7*  --   --  13.0*  --  12.5*  NEUTROABS  --   --  8.6*  --   --   --   --   --   --   HGB 14.2   < > 9.8* 6.1* 5.9* 8.7* 8.5* 8.5* 7.7*  HCT 39.5   < > 27.0* 17.5* 17.7* 24.8* 24.2* 24.7* 22.8*  MCV 84.0  --  84.1 87.9  --   --  84.6  --  88.4  PLT 65*  --  104* 103*  --   --  114*  --  125*   < > = values in this interval not displayed.    Basic Metabolic Panel: Recent Labs  Lab 04/07/22 1540 04/07/22 1753 04/07/22 2350 04/08/22 1314 04/09/22 0009 04/09/22 0625 04/10/22 0521 04/10/22 1715 04/11/22 0437  NA 138   < > 140   < > 138 138 141 142 140  K 4.0   < > 4.0   < > 4.0 4.0 4.2 3.9 3.6  CL 112*  --  113*  --  113* 111 115* 115* 113*  CO2 19*  --  19*  --  20* 21* 18* 18* 19*  GLUCOSE 201*  --  145*  --  128* 107* 97 130* 112*  BUN 12  --  17  --  38* 42* 49* 49* 52*  CREATININE 1.11  --  1.51*  --  2.67* 2.98* 3.33* 3.38* 3.52*  CALCIUM 6.9*  --  7.8*  --  6.9* 7.0* 6.9* 7.5* 7.4*  MG 1.4*  --  1.3*  --  2.2  --   --   --   --    < > = values in this interval not displayed.   GFR: Estimated Creatinine Clearance: 21.8 mL/min (A) (by C-G formula based on SCr of 3.52 mg/dL (H)). Recent Labs  Lab 04/09/22 0009 04/10/22 0521 04/10/22 1715 04/11/22 0437  WBC 12.4* 12.7* 13.0* 12.5*    Liver Function Tests: Recent Labs  Lab 04/07/22 1540 04/07/22 2350 04/09/22 0009 04/11/22 0437  AST 39 66* 228* 298*  ALT 26 39 55* 69*   ALKPHOS 23* 26* 23* 39  BILITOT 2.0* 2.4* 1.1 0.8  PROT 3.9* 4.4* 4.0*  4.6*  ALBUMIN 2.3* 2.7* 2.4* 2.0*   Recent Labs  Lab 04/07/22 2350  AMYLASE 58   No results for input(s): "AMMONIA" in the last 168 hours.  ABG    Component Value Date/Time   PHART 7.315 (L) 04/08/2022 1444   PCO2ART 38.8 04/08/2022 1444   PO2ART 159 (H) 04/08/2022 1444   HCO3 20.1 04/08/2022 1444   TCO2 21 (L) 04/08/2022 1444   ACIDBASEDEF 6.0 (H) 04/08/2022 1444   O2SAT 99 04/08/2022 1444     Coagulation Profile: Recent Labs  Lab 04/07/22 1540 04/11/22 0437  INR 1.5* 1.2    Cardiac Enzymes: No results for input(s): "CKTOTAL", "CKMB", "CKMBINDEX", "TROPONINI" in the last 168 hours.  HbA1C: Hgb A1c MFr Bld  Date/Time Value Ref Range Status  04/02/2022 03:04 AM 5.6 4.8 - 5.6 % Final    Comment:    (NOTE) Pre diabetes:          5.7%-6.4%  Diabetes:              >6.4%  Glycemic control for   <7.0% adults with diabetes     CBG: Recent Labs  Lab 04/07/22 2233 04/08/22 1727  GLUCAP 143* 104*   CRITICAL CARE Performed by: Lanier Clam   Total critical care time: 40 minutes  Critical care time was exclusive of separately billable procedures and treating other patients. Critical care was necessary to treat or prevent imminent or life-threatening deterioration.  Critical care was time spent personally by me on the following activities: development of treatment plan with patient and/or surrogate as well as nursing, discussions with consultants, evaluation of patient's response to treatment, examination of patient, obtaining history from patient or surrogate, ordering and performing treatments and interventions, ordering and review of laboratory studies, ordering and review of radiographic studies, pulse oximetry and re-evaluation of patient's condition.  Tessie Fass MSN, AGACNP-BC [facility] Pulmonary/Critical Care Medicine Amion for pager  04/11/2022, 10:37 AM

## 2022-04-11 NOTE — Progress Notes (Signed)
Cardiology Progress Note  Patient ID: Jeffrey Fitzgerald MRN: [id number] DOB: June 23, 1971 Date of Encounter: 04/11/2022  Primary Cardiologist: Rollene Rotunda, MD  Subjective   Chief Complaint: None.  HPI: Hemoglobin still dropping.  Has received transfusion.  Does report some leg pain but overall stable.  Creatinine still rising.  ROS:  All other ROS reviewed and negative. Pertinent positives noted in the HPI.     Inpatient Medications  Scheduled Meds:  sodium chloride   Intravenous Once   sodium chloride   Intravenous Once   acetaminophen  1,000 mg Oral Once   aspirin EC  81 mg Oral Q0600   atorvastatin  80 mg Oral Daily   Chlorhexidine Gluconate Cloth  6 each Topical Daily   docusate sodium  100 mg Oral Daily   pantoprazole  40 mg Oral Daily   sodium chloride flush  3 mL Intravenous Q12H   Continuous Infusions:  sodium chloride     sodium chloride     sodium chloride     sodium chloride     heparin 500 Units/hr (04/11/22 0600)   linezolid (ZYVOX) IV Stopped (04/10/22 2205)   piperacillin-tazobactam (ZOSYN)  IV Stopped (04/11/22 0506)   PRN Meds: sodium chloride, sodium chloride, acetaminophen **OR** acetaminophen, bisacodyl, hydrALAZINE, labetalol, metoprolol tartrate, morphine injection, ondansetron, oxyCODONE-acetaminophen, phenol, polyethylene glycol, potassium chloride, sodium chloride flush   Vital Signs   Vitals:   04/11/22 0500 04/11/22 0600 04/11/22 0700 04/11/22 0800  BP: 137/68 120/81 137/77 (!) 142/67  Pulse: 95 (!) 101 (!) 103 (!) 107  Resp: 13 13 15 17   Temp:    98.6 F (37 C)  TempSrc:    Oral  SpO2: 100% 100% 100% 100%  Weight:      Height:        Intake/Output Summary (Last 24 hours) at 04/11/2022 0816 Last data filed at 04/11/2022 0600 Gross per 24 hour  Intake 1377.74 ml  Output 1705 ml  Net -327.26 ml      04/09/2022    8:30 AM 04/09/2022    6:00 AM 04/08/2022   11:21 AM  Last 3 Weights  Weight (lbs) 151 lb 10.8 oz 151 lb 10.8 oz 145  lb  Weight (kg) 68.8 kg 68.8 kg 65.772 kg      Telemetry  Overnight telemetry shows sinus tachycardia low 100s, which I personally reviewed.   Physical Exam   Vitals:   04/11/22 0500 04/11/22 0600 04/11/22 0700 04/11/22 0800  BP: 137/68 120/81 137/77 (!) 142/67  Pulse: 95 (!) 101 (!) 103 (!) 107  Resp: 13 13 15 17   Temp:    98.6 F (37 C)  TempSrc:    Oral  SpO2: 100% 100% 100% 100%  Weight:      Height:        Intake/Output Summary (Last 24 hours) at 04/11/2022 0816 Last data filed at 04/11/2022 0600 Gross per 24 hour  Intake 1377.74 ml  Output 1705 ml  Net -327.26 ml       04/09/2022    8:30 AM 04/09/2022    6:00 AM 04/08/2022   11:21 AM  Last 3 Weights  Weight (lbs) 151 lb 10.8 oz 151 lb 10.8 oz 145 lb  Weight (kg) 68.8 kg 68.8 kg 65.772 kg    Body mass index is 25.24 kg/m.   General: Well nourished, well developed, in no acute distress Head: Atraumatic, normal size  Eyes: PEERLA, EOMI  Neck: Supple, no JVD Endocrine: No thryomegaly Cardiac: Normal S1, S2; RRR; no murmurs,  rubs, or gallops Lungs: Clear to auscultation bilaterally, no wheezing, rhonchi or rales  Abd: Soft, nontender, no hepatomegaly  Ext: Surgical dressings noted in the lower extremities, absent pulses, 2+ edema in LEs Musculoskeletal: Surgical dressings noted in the lower extremities, sanguinous output from wound VAC Neuro: Alert and oriented to person, place, time, and situation, CNII-XII grossly intact, no focal deficits  Psych: Normal mood and affect   Labs  High Sensitivity Troponin:  No results for input(s): "TROPONINIHS" in the last 720 hours.   Cardiac EnzymesNo results for input(s): "TROPONINI" in the last 168 hours. No results for input(s): "TROPIPOC" in the last 168 hours.  Chemistry Recent Labs  Lab 04/07/22 2350 04/08/22 1314 04/09/22 0009 04/09/22 0625 04/10/22 0521 04/10/22 1715 04/11/22 0437  NA 140   < > 138   < > 141 142 140  K 4.0   < > 4.0   < > 4.2 3.9 3.6  CL 113*   --  113*   < > 115* 115* 113*  CO2 19*  --  20*   < > 18* 18* 19*  GLUCOSE 145*  --  128*   < > 97 130* 112*  BUN 17  --  38*   < > 49* 49* 52*  CREATININE 1.51*  --  2.67*   < > 3.33* 3.38* 3.52*  CALCIUM 7.8*  --  6.9*   < > 6.9* 7.5* 7.4*  PROT 4.4*  --  4.0*  --   --   --  4.6*  ALBUMIN 2.7*  --  2.4*  --   --   --  2.0*  AST 66*  --  228*  --   --   --  298*  ALT 39  --  55*  --   --   --  69*  ALKPHOS 26*  --  23*  --   --   --  39  BILITOT 2.4*  --  1.1  --   --   --  0.8  GFRNONAA 56*  --  28*   < > 22* 21* 20*  ANIONGAP 8  --  5   < > 8 9 8    < > = values in this interval not displayed.    Hematology Recent Labs  Lab 04/10/22 0521 04/10/22 0554 04/10/22 1715 04/10/22 2108 04/11/22 0437  WBC 12.7*  --  13.0*  --  12.5*  RBC 1.99*  --  2.86*  --  2.58*  HGB 6.1*   < > 8.5* 8.5* 7.7*  HCT 17.5*   < > 24.2* 24.7* 22.8*  MCV 87.9  --  84.6  --  88.4  MCH 30.7  --  29.7  --  29.8  MCHC 34.9  --  35.1  --  33.8  RDW 15.6*  --  15.3  --  15.4  PLT 103*  --  114*  --  125*   < > = values in this interval not displayed.   BNPNo results for input(s): "BNP", "PROBNP" in the last 168 hours.  DDimer No results for input(s): "DDIMER" in the last 168 hours.   Radiology  No results found.  Cardiac Studies  TTE 04/02/2022   1. Left ventricular ejection fraction, by estimation, is 60 to 65%. The  left ventricle has normal function. The left ventricle demonstrates  regional wall motion abnormalities with basal inferior hypokinesis. There  is mild left ventricular hypertrophy.  Left ventricular diastolic parameters are consistent with Grade I  diastolic dysfunction (impaired relaxation).   2. Right ventricular systolic function is normal. The right ventricular  size is normal. Tricuspid regurgitation signal is inadequate for assessing  PA pressure.   3. The aortic valve is tricuspid. Aortic valve regurgitation is not  visualized. No aortic stenosis is present.   4. The mitral  valve is normal in structure. No evidence of mitral valve  regurgitation. No evidence of mitral stenosis.   5. The inferior vena cava is normal in size with greater than 50%  respiratory variability, suggesting right atrial pressure of 3 mmHg.   Patient Profile  Jeffrey Fitzgerald is a 51 y.o. male with hypertension, tobacco abuse, PAD who was admitted on 04/01/2022 with critical limb ischemia.  He is status post aortobifem bypass with bifemoral endarterectomy with left femoral to PT bypass on 04/07/2022.  Course complicated by acute thrombus of the right common femoral artery on 04/07/2022 status post thrombectomy.  Taken back to the OR on 04/08/2022 for right femoral to PT bypass with right lower extremity fasciotomy.  Status post left lower extremity fasciotomy and thrombectomy of the left femoral PT bypass on 04/09/2022.  Cardiology was consulted for hypertension management.  Course is now complicated by acute kidney injury as well as anemia.  Assessment & Plan   #PAD/critical limb ischemia #Postoperative anemia -04/07/2022: Aorta bifemoral bypass, bilateral femoral endarterectomies, left femoral to PT bypass -04/07/2022: Acute thrombosis of right common femoral artery status post thrombectomy -04/08/2022: Right femoral to PT bypass, right lower extremity fasciotomy -04/09/2022: Thrombectomy of left femoral to PT bypass, left lower extremity fasciotomy -Complicated course with multiple vascular surgeries including lower extremity fasciotomies.  Course is now complicated by acute kidney injury and postoperative anemia. -Cardiology was initially consulted for hypertension management. -In the setting of postoperative anemia would recommend to hold antihypertensive agents for now.  Surgery does not believe he has bleeding.  They believe this is just related to his postoperative course.  Would maintain transfusion status above 7.  Echocardiogram is normal.  Stress test was normal.  No chest pain. -Cautious with  aspirin and heparin.  Again I will leave this to the discretion of vascular surgery.  #Acute kidney injury -CCM following.  No indications for dialysis currently.  #Hypertension -Would recommend to hold antihypertensive agents given critical illness.    For questions or updates, please contact [facility] Please consult [email or web address] for contact info under   Signed, Gerri Spore T. Flora Lipps, MD, [facility]  [facility]  04/11/2022 8:16 AM

## 2022-04-11 NOTE — Evaluation (Signed)
Occupational Therapy Evaluation Patient Details Name: Jeffrey Fitzgerald MRN: [id number] DOB: 08-21-1971 Today's Date: 04/11/2022   History of Present Illness 51 yo male admitted 8/3 from cath lab after aortogram for LLE ischemia. 8/9 aortobifem BPG with bil fem endarterectomy. Post op RLE ischemia with return to OR same date for redo right groin exploration with thrombectomy and angioplasty. 8/10 RLE fem-pop BPG with 4 compartment fasciotomy. PMhx: HTN,  tobacco smoking and PVD   Clinical Impression   "I'm ready to get out of this bed". PTA pt lived with his sister and was modified independent with mobility and ADL using his scooter. Pt presents with significant functional decline since last surgery, requiring Max A +2 for lateral scoot transfer to chair and Max to total A for LB ADL due to deficits listed below. Unable to stand this session. [name] is very motivated to return home with assistance of his sister. Feel he could benefit from intensive rehab at [facility] to maximize functional level of independence and facilitate safe DC home @ wc/scooter level. VSS on RA throughout session. Acute OT to follow.   Boggy areas noted B heels - nsg made aware. Recommend pt wear B Prevalon boots at all times with the exception of mobility.    Recommendations for follow up therapy are one component of a multi-disciplinary discharge planning process, led by the attending physician.  Recommendations may be updated based on patient status, additional functional criteria and insurance authorization.   Follow Up Recommendations  Acute inpatient rehab (3hours/day)    Assistance Recommended at Discharge Frequent or constant Supervision/Assistance  Patient can return home with the following Two people to help with walking and/or transfers;A lot of help with bathing/dressing/bathroom;Direct supervision/assist for medications management;Assist for transportation;Help with stairs or ramp for entrance    Functional Status  Assessment  Patient has had a recent decline in their functional status and demonstrates the ability to make significant improvements in function in a reasonable and predictable amount of time.  Equipment Recommendations  Wheelchair (measurements OT);Wheelchair cushion (measurements OT);BSC/3in1    Recommendations for Other Services Rehab consult     Precautions / Restrictions Precautions Precautions: Fall Precaution Comments: flo trac, Rt  calf and groin VAC Restrictions Weight Bearing Restrictions: No       Mobility Bed Mobility Overal bed mobility: Needs Assistance Bed Mobility: Sit to Supine, Supine to Sit     Supine to sit: Max assist     General bed mobility comments: minimal movement of legs; assist to transition trunk to EOB with elevated HOB - required use of bed pad - noted significant difficulty with problem solving during this task (?baseline cog?)    Transfers Overall transfer level: Needs assistance   Transfers: Sit to/from Stand Sit to Stand:  (unable to stand)                  Balance Overall balance assessment: Needs assistance   Sitting balance-Leahy Scale: Fair Sitting balance - Comments: EOB with guarding                                   ADL either performed or assessed with clinical judgement   ADL Overall ADL's : Needs assistance/impaired     Grooming: Set up   Upper Body Bathing: Set up;Supervision/ safety;Sitting   Lower Body Bathing: Maximal assistance   Upper Body Dressing : Minimal assistance   Lower Body Dressing: Maximal assistance (lateral leans) -  pt states he could put on his socks, however just held his socks toward his feet  - difficulty with problem solving and understanding his deficits   Toilet Transfer: Maximal assistance;+2 for physical assistance (lateral scoot;simulated)   Toileting- Clothing Manipulation and Hygiene: Total assistance (foley)       Functional mobility during ADLs: Maximal  assistance;+2 for physical assistance       Vision         Perception     Praxis      Pertinent Vitals/Pain Pain Assessment Pain Assessment: Faces Faces Pain Scale: Hurts even more Pain Location: LLE with touch, movement Pain Descriptors / Indicators: Guarding, Grimacing Pain Intervention(s): Limited activity within patient's tolerance, RN gave pain meds during session     Hand Dominance Right   Extremity/Trunk Assessment     Lower Extremity Assessment RLE Deficits / Details: limited by pain grossly 40 degrees knee flexion, decreased strength with hip and knee flexion, unable to formally assess LLE Deficits / Details: grossly 50 degrees active knee and hip flexion, limited by pain and unable to fully assess, decreased strength   Cervical / Trunk Assessment Cervical / Trunk Assessment: Normal   Communication Communication Communication: No difficulties   Cognition Arousal/Alertness: Awake/alert Behavior During Therapy: Flat affect Overall Cognitive Status: No family/caregiver present to determine baseline cognitive functioning                                 General Comments: very slow processing and diffiuclty with problem solving; unsure of baseline     General Comments  BLE edema; holding R knww in @ 20 knee flexion    Exercises Exercises: General Upper Extremity, General Lower Extremity General Exercises - Upper Extremity Shoulder Flexion: AROM, AAROM, Both, 15 reps Elbow Flexion: AROM, Strengthening, Both, 15 reps Elbow Extension: AROM, Strengthening, Both, 15 reps General Exercises - Lower Extremity Ankle Circles/Pumps: AAROM, PROM, Both, 10 reps Heel Slides: Both, 10 reps   Shoulder Instructions      Home Living Family/patient expects to be discharged to:: Private residence Living Arrangements: Other relatives (sister) Available Help at Discharge: Family;Available 24 hours/day Type of Home: House Home Access: Level entry     Home  Layout: One level     Bathroom Shower/Tub: Chief Strategy Officer: Standard Bathroom Accessibility: Yes How Accessible: Accessible via wheelchair Home Equipment: Cane - single point;Electric scooter          Prior Functioning/Environment Prior Level of Function : Independent/Modified Independent             Mobility Comments: uses a scooter for all mobility other than walking in bathroom holding wall; sometimes brings the scooter into the bathroom ADLs Comments: stands to shower, doesn't drive        OT Problem List: Decreased strength;Decreased range of motion;Decreased activity tolerance;Impaired balance (sitting and/or standing);Decreased safety awareness;Decreased knowledge of use of DME or AE;Cardiopulmonary status limiting activity;Impaired sensation;Pain;Increased edema      OT Treatment/Interventions: Self-care/ADL training;Therapeutic exercise;DME and/or AE instruction;Therapeutic activities;Patient/family education;Balance training    OT Goals(Current goals can be found in the care plan section) Acute Rehab OT Goals Patient Stated Goal: to go home OT Goal Formulation: With patient Time For Goal Achievement: 04/25/22 Potential to Achieve Goals: Good  OT Frequency: Min 2X/week    Co-evaluation              AM-PAC OT "6 Clicks" Daily Activity  Outcome Measure Help from another person eating meals?: None Help from another person taking care of personal grooming?: A Little Help from another person toileting, which includes using toliet, bedpan, or urinal?: Total Help from another person bathing (including washing, rinsing, drying)?: A Lot Help from another person to put on and taking off regular upper body clothing?: A Little Help from another person to put on and taking off regular lower body clothing?: A Lot 6 Click Score: 15   End of Session Equipment Utilized During Treatment: Gait belt Nurse Communication: Mobility status;Need for lift  equipment (may need skylift or +2 lat scoot to bed with useof gait belt adn bed pad)  Activity Tolerance: Patient tolerated treatment well Patient left: in chair;with call bell/phone within reach  OT Visit Diagnosis: Unsteadiness on feet (R26.81);Other abnormalities of gait and mobility (R26.89);Muscle weakness (generalized) (M62.81);Other symptoms and signs involving cognitive function;Pain Pain - Right/Left:  (B) Pain - part of body:  (feet/legs)                Time: 9030-0923 OT Time Calculation (min): 41 min Charges:  OT General Charges $OT Visit: 1 Visit OT Evaluation $OT Eval Moderate Complexity: 1 Mod OT Treatments $Self Care/Home Management : 8-22 mins $Therapeutic Exercise: 8-22 mins  Luisa Dago, OT/L   Acute OT Clinical Specialist Acute Rehabilitation Services Pager [phone] Office [phone]   [facility] 04/11/2022, 10:55 AM

## 2022-04-11 NOTE — Progress Notes (Addendum)
Pharmacy Antibiotic Note  Jeffrey Fitzgerald is a 51 y.o. male admitted on 04/01/2022 with critical limb ischemia and LLE wound with drainage.  Pharmacy has been consulted for Vancomycin>linezolid / Zosyn dosing.  Currently on day#10 of antibiotics. Scr continues to climb at 3.52 (CrCl 21 mL/min). WBC 21.5, afebrile.    Plan: Zyvox 600mg  IV q12 Continues on Zosyn 3.375 gm IV q8h (4 hour infusion). Daily BMET to continue to monitor renal function. Discussed with vascular and CCM, plan for 14 days total of antibiotics - request to call Dr before discharge   Height: 5\' 5"  (165.1 cm) Weight: 68.8 kg (151 lb 10.8 oz) IBW/kg (Calculated) : 61.5  Temp (24hrs), Avg:98.3 F (36.8 C), Min:97.6 F (36.4 C), Max:98.6 F (37 C)  Recent Labs  Lab 04/07/22 2350 04/09/22 0009 04/09/22 0625 04/10/22 0521 04/10/22 1715 04/11/22 0437  WBC 12.8* 12.4*  --  12.7* 13.0* 12.5*  CREATININE 1.51* 2.67* 2.98* 3.33* 3.38* 3.52*     Estimated Creatinine Clearance: 21.8 mL/min (A) (by C-G formula based on SCr of 3.52 mg/dL (H)).    No Known Allergies  Vancomycin 8/4>8/10 Zosyn 8/4> Zyvox 8/10>  04/13/22, PharmD, BCCCP Clinical Pharmacist  Phone: [phone] 04/11/2022 8:40 AM  Please check AMION for all [facility] Pharmacy phone numbers After 10:00 PM, call Main Pharmacy [phone]

## 2022-04-12 ENCOUNTER — Inpatient Hospital Stay (HOSPITAL_COMMUNITY): Payer: Medicaid Other

## 2022-04-12 LAB — BASIC METABOLIC PANEL
Anion gap: 9 (ref 5–15)
Anion gap: 7 (ref 5–15)
BUN: 52 mg/dL — ABNORMAL HIGH (ref 6–20)
BUN: 51 mg/dL — ABNORMAL HIGH (ref 6–20)
CO2: 20 mmol/L — ABNORMAL LOW (ref 22–32)
CO2: 21 mmol/L — ABNORMAL LOW (ref 22–32)
Calcium: 7.6 mg/dL — ABNORMAL LOW (ref 8.9–10.3)
Calcium: 7.5 mg/dL — ABNORMAL LOW (ref 8.9–10.3)
Chloride: 111 mmol/L (ref 98–111)
Chloride: 112 mmol/L — ABNORMAL HIGH (ref 98–111)
Creatinine, Ser: 3.42 mg/dL — ABNORMAL HIGH (ref 0.61–1.24)
Creatinine, Ser: 3.21 mg/dL — ABNORMAL HIGH (ref 0.61–1.24)
GFR, Estimated: 21 mL/min — ABNORMAL LOW (ref >60)
GFR, Estimated: 23 mL/min — ABNORMAL LOW (ref >60)
Glucose, Bld: 105 mg/dL — ABNORMAL HIGH (ref 70–99)
Glucose, Bld: 122 mg/dL — ABNORMAL HIGH (ref 70–99)
Potassium: 3.6 mmol/L (ref 3.5–5.1)
Potassium: 3.4 mmol/L — ABNORMAL LOW (ref 3.5–5.1)
Sodium: 140 mmol/L (ref 135–145)
Sodium: 140 mmol/L (ref 135–145)

## 2022-04-12 LAB — CBC
HCT: 19.6 % — ABNORMAL LOW (ref 39.0–52.0)
HCT: 26.4 % — ABNORMAL LOW (ref 39.0–52.0)
Hemoglobin: 6.8 g/dL — CL (ref 13.0–17.0)
Hemoglobin: 9.1 g/dL — ABNORMAL LOW (ref 13.0–17.0)
MCH: 30.6 pg (ref 26.0–34.0)
MCH: 30.3 pg (ref 26.0–34.0)
MCHC: 34.7 g/dL (ref 30.0–36.0)
MCHC: 34.5 g/dL (ref 30.0–36.0)
MCV: 88.3 fL (ref 80.0–100.0)
MCV: 88 fL (ref 80.0–100.0)
Platelets: 136 10*3/uL — ABNORMAL LOW (ref 150–400)
Platelets: 138 10*3/uL — ABNORMAL LOW (ref 150–400)
RBC: 2.22 MIL/uL — ABNORMAL LOW (ref 4.22–5.81)
RBC: 3 MIL/uL — ABNORMAL LOW (ref 4.22–5.81)
RDW: 15.6 % — ABNORMAL HIGH (ref 11.5–15.5)
RDW: 16.9 % — ABNORMAL HIGH (ref 11.5–15.5)
WBC: 14.1 10*3/uL — ABNORMAL HIGH (ref 4.0–10.5)
WBC: 14.6 10*3/uL — ABNORMAL HIGH (ref 4.0–10.5)
nRBC: 0.6 % — ABNORMAL HIGH (ref 0.0–0.2)
nRBC: 0.5 % — ABNORMAL HIGH (ref 0.0–0.2)

## 2022-04-12 LAB — PREPARE RBC (CROSSMATCH)

## 2022-04-12 LAB — HEPARIN LEVEL (UNFRACTIONATED): Heparin Unfractionated: <0.1 IU/mL — ABNORMAL LOW (ref 0.30–0.70)

## 2022-04-12 LAB — CALCIUM, IONIZED: Calcium, Ionized, Serum: 4.3 mg/dL — ABNORMAL LOW (ref 4.5–5.6)

## 2022-04-12 MED ORDER — ORAL CARE MOUTH RINSE
15.0000 mL | OROMUCOSAL | Status: DC | PRN
Start: 2022-04-12 — End: 2022-05-19

## 2022-04-12 MED ORDER — GERHARDT'S BUTT CREAM
TOPICAL_CREAM | CUTANEOUS | Status: DC | PRN
  Filled 2022-04-12: qty 1

## 2022-04-12 MED ORDER — SODIUM CHLORIDE 0.9% IV SOLUTION: Freq: Once | INTRAVENOUS | Status: DC

## 2022-04-12 MED ORDER — NICOTINE 21 MG/24HR TD PT24
21.0000 mg | MEDICATED_PATCH | Freq: Every day | TRANSDERMAL | Status: DC
  Administered 2022-04-12 – 2022-05-19 (×37): 21 mg via TRANSDERMAL
  Filled 2022-04-12 (×38): qty 1

## 2022-04-12 MED ORDER — POTASSIUM CHLORIDE CRYS ER 20 MEQ PO TBCR
20.0000 meq | EXTENDED_RELEASE_TABLET | Freq: Once | ORAL | Status: AC
  Administered 2022-04-12: 20 meq via ORAL
  Filled 2022-04-12: qty 1

## 2022-04-12 MED ORDER — METOCLOPRAMIDE HCL 5 MG/ML IJ SOLN
10.0000 mg | Freq: Once | INTRAMUSCULAR | Status: AC
Start: 2022-04-12 — End: 2022-04-12
  Administered 2022-04-12: 10 mg via INTRAVENOUS
  Filled 2022-04-12: qty 2

## 2022-04-12 MED ORDER — FUROSEMIDE 10 MG/ML IJ SOLN
40.0000 mg | Freq: Two times a day (BID) | INTRAMUSCULAR | Status: DC
Start: 2022-04-12 — End: 2022-04-13
  Administered 2022-04-12 – 2022-04-13 (×3): 40 mg via INTRAVENOUS
  Filled 2022-04-12 (×3): qty 4

## 2022-04-12 MED FILL — Sodium Chloride IV Soln 0.9%: INTRAVENOUS | Qty: 1000 | Status: AC

## 2022-04-12 MED FILL — Heparin Sodium (Porcine) Inj 1000 Unit/ML: INTRAMUSCULAR | Qty: 30 | Status: AC

## 2022-04-12 NOTE — Progress Notes (Signed)
Inpatient Rehab Admissions Coordinator:   Per updated OT recommendations, pt was screened for CIR by Estill Dooms, PT, DPT.  Note significant decline in function with OT following procedure, but pt also limited mobility at baseline.  Living with mom or sister (unclear).  Based on OT evaluation would likely need physical assist for all mobility at ADLs at time of discharge even with CIR stay.  Will see how he does with PT today to make decision regarding potential candidacy.   Estill Dooms, PT, DPT Admissions Coordinator [phone] 04/12/22  12:05 PM

## 2022-04-12 NOTE — Progress Notes (Signed)
Physical Therapy Treatment Patient Details Name: Jeffrey Fitzgerald MRN: [id number] DOB: 29-Mar-1971 Today's Date: 04/12/2022   History of Present Illness 51 yo male admitted 8/3 from cath lab after aortogram for LLE ischemia. 8/9 aortobifem BPG with bil fem endarterectomy. Post op RLE ischemia with return to OR same date for redo right groin exploration with thrombectomy and angioplasty. 8/10 RLE fem-pop BPG with 4 compartment fasciotomy. Pt underwent thrombectomy left femoral to PTBPG wtih closure and right fasciotomy wtih VAC on right LE on 8/11. Another surgery is planned for Wed 8/16.   PMhx: HTN,  tobacco smoking and PVD    PT Comments    Pt admitted with above diagnosis. Pt limited today by incr nausea at EOB.  Pt needed to lie back down as he as severly nauseated therefore only sat EOB about 8 min with min to max assist. Pt also did a few exercises. Will progress as pt able.  Pt currently with functional limitations due to balance and endurance deficits. Pt will benefit from skilled PT to increase their independence and safety with mobility to allow discharge to the venue listed below.      Recommendations for follow up therapy are one component of a multi-disciplinary discharge planning process, led by the attending physician.  Recommendations may be updated based on patient status, additional functional criteria and insurance authorization.  Follow Up Recommendations  Skilled nursing-short term rehab (<3 hours/day) Can patient physically be transported by private vehicle: No   Assistance Recommended at Discharge Frequent or constant Supervision/Assistance  Patient can return home with the following A lot of help with walking and/or transfers;A lot of help with bathing/dressing/bathroom;Assist for transportation;Direct supervision/assist for financial management;Assistance with cooking/housework;Help with stairs or ramp for entrance;Direct supervision/assist for medications management    Equipment Recommendations  Rolling walker (2 wheels);BSC/3in1    Recommendations for Other Services       Precautions / Restrictions Precautions Precautions: Fall Precaution Comments: core trac, Rt groin VAC Restrictions Weight Bearing Restrictions: No     Mobility  Bed Mobility Overal bed mobility: Needs Assistance Bed Mobility: Sit to Supine, Supine to Sit     Supine to sit: Max assist Sit to supine: Mod assist   General bed mobility comments: minimal movemetn of legs and assist to transition trunk to EOB with elevated HOB.  Assist also to get back into bed.    Transfers Overall transfer level: Needs assistance   Transfers: Sit to/from Stand Sit to Stand:  (unable to stand)           General transfer comment: Pt reports feeling nauseated after he wa EOB for 4 min.  Had to lie pt back down.  Nurse aware.    Ambulation/Gait               General Gait Details: unable   Stairs             Wheelchair Mobility    Modified Rankin (Stroke Patients Only)       Balance Overall balance assessment: Needs assistance Sitting-balance support: Bilateral upper extremity supported, Feet supported Sitting balance-Leahy Scale: Poor Sitting balance - Comments: EOB with min to max assist. Pt became nauseated and wanted to lie down and didnt wait for PT intiiallly needing mod assist of 2 to lie down as pt with weakness.  Cognition Arousal/Alertness: Awake/alert Behavior During Therapy: Flat affect Overall Cognitive Status: No family/caregiver present to determine baseline cognitive functioning                                 General Comments: very slow processing and diffiuclty with problem solving; unsure of baseline        Exercises General Exercises - Lower Extremity Ankle Circles/Pumps: AAROM, PROM, Both, 10 reps Quad Sets: AAROM, Both, AROM, 5 reps, Supine Gluteal Sets: AROM, Both, 5  reps, Supine Heel Slides: Both, 10 reps, AAROM, Supine Hip ABduction/ADduction: AAROM, Both, 5 reps, Supine    General Comments        Pertinent Vitals/Pain Pain Assessment Pain Assessment: Faces Faces Pain Scale: Hurts even more Pain Location: LLE with touch, movement Pain Descriptors / Indicators: Guarding, Grimacing Pain Intervention(s): Limited activity within patient's tolerance, Monitored during session, Repositioned    Home Living                          Prior Function            PT Goals (current goals can now be found in the care plan section) Acute Rehab PT Goals Patient Stated Goal: be able to walk Progress towards PT goals: Progressing toward goals    Frequency    Min 3X/week      PT Plan Current plan remains appropriate    Co-evaluation              AM-PAC PT "6 Clicks" Mobility   Outcome Measure  Help needed turning from your back to your side while in a flat bed without using bedrails?: A Little Help needed moving from lying on your back to sitting on the side of a flat bed without using bedrails?: A Lot Help needed moving to and from a bed to a chair (including a wheelchair)?: Total Help needed standing up from a chair using your arms (e.g., wheelchair or bedside chair)?: Total Help needed to walk in hospital room?: Total Help needed climbing 3-5 steps with a railing? : Total 6 Click Score: 9    End of Session Equipment Utilized During Treatment: Gait belt Activity Tolerance: Patient limited by fatigue;Patient limited by pain Patient left: with call bell/phone within reach;in bed;with bed alarm set Nurse Communication: Mobility status;Need for lift equipment PT Visit Diagnosis: Other abnormalities of gait and mobility (R26.89);Difficulty in walking, not elsewhere classified (R26.2);Muscle weakness (generalized) (M62.81)     Time: 4503-8882 PT Time Calculation (min) (ACUTE ONLY): 15 min  Charges:  $Therapeutic Activity:  8-22 mins                     [facility] M,PT Acute Rehab Services [phone]    Bevelyn Buckles 04/12/2022, 3:24 PM

## 2022-04-12 NOTE — Progress Notes (Signed)
ANTICOAGULATION CONSULT NOTE   Pharmacy Consult for heparin Indication:  PVD  No Known Allergies  Patient Measurements: Height: 5\' 5"  (165.1 cm) Weight: 68.8 kg (151 lb 10.8 oz) IBW/kg (Calculated) : 61.5 Heparin Dosing Weight: 66kg  Vital Signs: Temp: 97.6 F (36.4 C) (08/14 0708) Temp Source: Axillary (08/14 0708) BP: 151/65 (08/14 0730) Pulse Rate: 99 (08/14 0735)  Labs: Recent Labs    04/10/22 0554 04/10/22 1349 04/11/22 0437 04/11/22 1906 04/12/22 0406  HGB 5.9*   < > 7.7* 7.1* 6.8*  HCT 17.7*   < > 22.8* 20.3* 19.6*  PLT  --    < > 125* 137* 136*  LABPROT  --   --  15.1  --   --   INR  --   --  1.2  --   --   HEPARINUNFRC <0.10*  --  <0.10*  --  <0.10*  CREATININE  --    < > 3.52* 3.59* 3.42*   < > = values in this interval not displayed.     Estimated Creatinine Clearance: 22.5 mL/min (A) (by C-G formula based on SCr of 3.42 mg/dL (H)).   Medical History: Past Medical History:  Diagnosis Date   Hypertension    Peripheral vascular disease (HCC)    Tobacco abuse     Medications:  Medications Prior to Admission  Medication Sig Dispense Refill Last Dose   acetaminophen (TYLENOL) 500 MG tablet Take 500 mg by mouth every 6 (six) hours as needed for moderate pain.   03/31/2022   ibuprofen (ADVIL) 200 MG tablet Take 200 mg by mouth every 6 (six) hours as needed for moderate pain.   03/31/2022   Scheduled:   sodium chloride   Intravenous Once   sodium chloride   Intravenous Once   acetaminophen  1,000 mg Oral Once   aspirin EC  81 mg Oral Q0600   atorvastatin  80 mg Oral Daily   Chlorhexidine Gluconate Cloth  6 each Topical Daily   docusate sodium  100 mg Oral Daily   metoCLOPramide (REGLAN) injection  10 mg Intravenous Once   pantoprazole  40 mg Oral Daily   potassium chloride  20 mEq Oral Once   sodium chloride flush  3 mL Intravenous Q12H   Infusions:   sodium chloride     sodium chloride     heparin 500 Units/hr (04/12/22 0730)   linezolid  (ZYVOX) IV Stopped (04/11/22 2205)   piperacillin-tazobactam (ZOSYN)  IV Stopped (04/12/22 0603)    Assessment: Pt with PVD who is s/p bypass. Heparin was ordered by [facility] at 500 units/hr without titration.   Heparin levels have remained undetectable as expected.  Hgb low today at 6.8.  Platelet count okay.   Goal of Therapy:   Monitor platelets by anticoagulation protocol: Yes   Plan:  Heparin 500 units/hr per [facility] Daily heparin and CBC  04/14/22, Reece Leader, BCPS, BCCP Clinical Pharmacist  04/12/2022 8:30 AM   [facility] pharmacy phone numbers are listed on [email or web address]

## 2022-04-12 NOTE — Progress Notes (Signed)
[name], MRN:  [id number], DOB:  10-Sep-1970, LOS: 11 ADMISSION DATE:  04/01/2022, CONSULTATION DATE:  04/10/22 REFERRING MD:  Sherral Hammers - [facility] , CHIEF COMPLAINT:  acute kidney injury   History of Present Illness:  51 yo M admitted 8/3 with critical limb ischemia LLE and went to the OR 8/3 for aortobifem bypass, bilateral common femoral endarterectomy w profundoplasty, L common fem to pTib bypass on 8/9. Post op had R foot pain and loss of signals, and went for take back 8/9 for R common femoral artery thrombectomy, endarterectomy R profunda + bovine patch, and revision RLE aortibifem bypass.  8/10 RLE no doppler signal, loss of motor and sensory fxn, back to OR 8/10 for R fem distal bypass with RLE 4x fasciotomy and R posterior tib artery thrombectomy.  8/11 no signals L foot, loss of motor in L foot/ankle. Back to OR 8/11 for thrombectomy L common fem to pTib bypass, RLE fasciotomy closure + wound vac.  EBL 250  8/12 pt with progressive Cr rise and hgb decline.   PCCM consulted in this setting   Pertinent  Medical History  Critical limb ischemia   Significant Hospital Events: Including procedures, antibiotic start and stop dates in addition to other pertinent events   8/3 admitted 8/9 OR aortobifem bypass. Take back for R common fem thrombectomy, endartarectomy, revision RLE aortobifem bypass 8/10 take back R fem distal bypass, RLE 4 compartment fasciotomy 8/11 take back thrombectomy L common fem - PT bypass, RLE fasciotomy closure  8/12 Cr rise to 3.3 Hgb drop to 5.9 PCCM consult  8/13 making good urine, still slight Cr incr.  8/14 creatinine plateau today, making urine, volume up, episode of emesis  Interim History / Subjective:  No overnight events, Hgb 6.8, getting one unit PRBC's Creatinine 3.4 down from 3.5 yesterday 1.3L UOP yesterday Episode of vomiting, KUB with likely ileus   Objective   Blood pressure (!) 151/65, pulse 99, temperature 97.6 F (36.4 C),  temperature source Axillary, resp. rate 18, height 5\' 5"  (1.651 m), weight 68.8 kg, SpO2 100 %.        Intake/Output Summary (Last 24 hours) at 04/12/2022 0800 Last data filed at 04/12/2022 0730 Gross per 24 hour  Intake 1921.3 ml  Output 1825 ml  Net 96.3 ml    Filed Weights   04/08/22 1121 04/09/22 0600 04/09/22 0830  Weight: 65.8 kg 68.8 kg 68.8 kg      General: chronically ill-appearing M, resting in bed in no distress HEENT: MM pink/moist, sclera anicteric  Neuro: awake, alert and oriented, non-focal CV: s1s2 rrr, no m/r/g PULM:  clear on RA without distress GI: soft, bsx4 quiet but active, mildly distended Extremities: warm/dry, bilat LE dressings with R thigh and calf wound vac, warm with palpable pulses  Skin: no rashes or lesions    Labs reviewed:   Resolved Hospital Problem list     Assessment & Plan:   Critical Limb Ischemia  s/p aortobifem bypass, revision R aortobifem bypass + RLE thrombectomy + endarterectomy, R fem to posterior tib bypass + R pTib thrombectomy + RLE 4 fasciotomy, LLE thrombectomy and RLE fasciotomy closure P -management per primary team -anticipate 10-14d course abx -- zyvox + zosyn -- per [facility]  -fq vasc checks  -cont hep gtt  -back to OR for closure when less volume overloaded  ABLA Thrombocytopenia  -8/11 EBL 250  -8/10 EBL 650 ml (+ 4 PRBC 1 plt 2 FFP during case)  -8/9 (1st case)  EBL 3L (+ 700 cellsaver 3 PRBC during case)  -8/9 (take back) EBL (+ 1 FFP 2 PRBC during case) - think that hgb drop 8/12 was reflective of above losses + hemodilution. Responded well to 2 PRBC. Slight drift 8/13 but no findings concerning for bleed  -8/14 additional 1 unit PRBC's P -BID CBC  -serial abd exams     Ileus  N/V -Kub consistent with ileus, moving bowels this AM without abdominal tenderniess P: -one dose reglan, NPO for today, if has another episode of emesis then likely needs  NGT -dulcolax and miralax -emesis dark but not  coffee ground per nursing, continue protonix    AKI NAGMA -in setting of above -creatinine slightly improved and increasing UOP P -strict I/O -minimize nephrotoxins as able -continue to trend renal indices   HFpEF Echo 8/4 with preserved EF and Grade I diastolic dysfunction -Lasix 40mg  bid today  Transaminitis  -hypoperfusion in setting of above  P -trend LFTs intermittently , coags     Best Practice (right click and "Reselect all SmartList Selections" daily)   Diet/type: Regular consistency (see orders), NPO for today given ileus and vomiting DVT prophylaxis: systemic heparin GI prophylaxis: PPI Lines: Central line Foley:  Yes, and it is still needed Code Status:  full code Last date of multidisciplinary goals of care discussion [per primary ]  Labs   CBC: Recent Labs  Lab 04/09/22 0009 04/10/22 0521 04/10/22 0554 04/10/22 1715 04/10/22 2108 04/11/22 0437 04/11/22 1906 04/12/22 0406  WBC 12.4* 12.7*  --  13.0*  --  12.5* 13.5* 14.1*  NEUTROABS 8.6*  --   --   --   --   --   --   --   HGB 9.8* 6.1*   < > 8.5* 8.5* 7.7* 7.1* 6.8*  HCT 27.0* 17.5*   < > 24.2* 24.7* 22.8* 20.3* 19.6*  MCV 84.1 87.9  --  84.6  --  88.4 87.5 88.3  PLT 104* 103*  --  114*  --  125* 137* 136*   < > = values in this interval not displayed.     Basic Metabolic Panel: Recent Labs  Lab 04/07/22 1540 04/07/22 1753 04/07/22 2350 04/08/22 1314 04/09/22 0009 04/09/22 0625 04/10/22 0521 04/10/22 1715 04/11/22 0437 04/11/22 1906 04/12/22 0406  NA 138   < > 140   < > 138   < > 141 142 140 141 140  K 4.0   < > 4.0   < > 4.0   < > 4.2 3.9 3.6 3.8 3.6  CL 112*  --  113*  --  113*   < > 115* 115* 113* 112* 111  CO2 19*  --  19*  --  20*   < > 18* 18* 19* 20* 20*  GLUCOSE 201*  --  145*  --  128*   < > 97 130* 112* 117* 105*  BUN 12  --  17  --  38*   < > 49* 49* 52* 50* 52*  CREATININE 1.11  --  1.51*  --  2.67*   < > 3.33* 3.38* 3.52* 3.59* 3.42*  CALCIUM 6.9*  --  7.8*  --   6.9*   < > 6.9* 7.5* 7.4* 7.5* 7.6*  MG 1.4*  --  1.3*  --  2.2  --   --   --   --   --   --    < > = values in this interval not displayed.  GFR: Estimated Creatinine Clearance: 22.5 mL/min (A) (by C-G formula based on SCr of 3.42 mg/dL (H)). Recent Labs  Lab 04/10/22 1715 04/11/22 0437 04/11/22 1906 04/12/22 0406  WBC 13.0* 12.5* 13.5* 14.1*     Liver Function Tests: Recent Labs  Lab 04/07/22 1540 04/07/22 2350 04/09/22 0009 04/11/22 0437  AST 39 66* 228* 298*  ALT 26 39 55* 69*  ALKPHOS 23* 26* 23* 39  BILITOT 2.0* 2.4* 1.1 0.8  PROT 3.9* 4.4* 4.0* 4.6*  ALBUMIN 2.3* 2.7* 2.4* 2.0*    Recent Labs  Lab 04/07/22 2350  AMYLASE 58    No results for input(s): "AMMONIA" in the last 168 hours.  ABG    Component Value Date/Time   PHART 7.315 (L) 04/08/2022 1444   PCO2ART 38.8 04/08/2022 1444   PO2ART 159 (H) 04/08/2022 1444   HCO3 20.1 04/08/2022 1444   TCO2 21 (L) 04/08/2022 1444   ACIDBASEDEF 6.0 (H) 04/08/2022 1444   O2SAT 99 04/08/2022 1444     Coagulation Profile: Recent Labs  Lab 04/07/22 1540 04/11/22 0437  INR 1.5* 1.2     Cardiac Enzymes: No results for input(s): "CKTOTAL", "CKMB", "CKMBINDEX", "TROPONINI" in the last 168 hours.  HbA1C: Hgb A1c MFr Bld  Date/Time Value Ref Range Status  04/02/2022 03:04 AM 5.6 4.8 - 5.6 % Final    Comment:    (NOTE) Pre diabetes:          5.7%-6.4%  Diabetes:              >6.4%  Glycemic control for   <7.0% adults with diabetes     CBG: Recent Labs  Lab 04/07/22 2233 04/08/22 1727  GLUCAP 143* 104*    Ellieanna Funderburg R Salam Chesterfield, PA-C [facility] See Amion for pager If no response to pager , please call [phone] until 7pm After 7:00 pm call Elink  [phone]?[facility]

## 2022-04-12 NOTE — Progress Notes (Addendum)
Progress Note    04/12/2022 7:26 AM 3 Days Post-Op  Subjective:  no major complaints   Vitals:   04/12/22 0708 04/12/22 0715  BP: (!) 161/67 (!) 161/67  Pulse:  (!) 104  Resp:  20  Temp: 97.6 F (36.4 C)   SpO2:  100%   Physical Exam: Cardiac:  tachy Lungs:  non labored Incisions:  intact and well appearing, Prevena right Groin with good seal Extremities:  well perfused and warm with Doppler DP/PT Abdomen:  non distended, expected tenderness Neurologic: alert   CBC    Component Value Date/Time   WBC 14.1 (H) 04/12/2022 0406   RBC 2.22 (L) 04/12/2022 0406   HGB 6.8 (LL) 04/12/2022 0406   HCT 19.6 (L) 04/12/2022 0406   PLT 136 (L) 04/12/2022 0406   MCV 88.3 04/12/2022 0406   MCH 30.6 04/12/2022 0406   MCHC 34.7 04/12/2022 0406   RDW 15.6 (H) 04/12/2022 0406   LYMPHSABS 2.0 04/09/2022 0009   MONOABS 1.7 (H) 04/09/2022 0009   EOSABS 0.0 04/09/2022 0009   BASOSABS 0.0 04/09/2022 0009    BMET    Component Value Date/Time   NA 140 04/12/2022 0406   K 3.6 04/12/2022 0406   CL 111 04/12/2022 0406   CO2 20 (L) 04/12/2022 0406   GLUCOSE 105 (H) 04/12/2022 0406   BUN 52 (H) 04/12/2022 0406   CREATININE 3.42 (H) 04/12/2022 0406   CALCIUM 7.6 (L) 04/12/2022 0406   GFRNONAA 21 (L) 04/12/2022 0406   GFRAA >60 01/12/2020 1421    INR    Component Value Date/Time   INR 1.2 04/11/2022 0437     Intake/Output Summary (Last 24 hours) at 04/12/2022 0726 Last data filed at 04/12/2022 0500 Gross per 24 hour  Intake 2029.77 ml  Output 1790 ml  Net 239.77 ml     Assessment/Plan:  51 y.o. male is s/p  1.  Thrombectomy of left common femoral to posterior tibial artery bypass 2.  Closure of right medial calf fasciotomy 3.  Application of negative pressure wound VAC greater right lateral calf fasciotomy 3 Days Post-Op   Bilateral lower extremities are well perfused and warm. Doppler Dp/PT signals Incisions are all intact and well appearing Pain overall well  controlled Tolerating regular diet No flatus or BM. Continue Bowel Regimen Scr stable 3.42 this morning Strict I & O's Hgb 6.8 this morning. Transfusing 2 Units PRBC Will need fasciotomy closure later this week. Timing per Dr. Chestine Spore Continue PT/OT  DVT prophylaxis:  Heparin IV   Graceann Congress, PA-C [facility] [phone] 04/12/2022 7:26 AM  I have seen and evaluated the patient. I agree with the PA note as documented above.  Now status post aortobifemoral bypass with bilateral femoral to PT bypasses for CLI with tissue loss.  no acute events overnight.  Stable in the ICU.  Neuro: Pain well controlled.  Able to wiggle his toes in both feet.  Really limited mobility prior to surgery.  Therapy recommending CIR. CV: Hemodynamically stable.  Hemoglobin 7.1 to 6.8.  Transfuse 2upRBCs.  No signs of surgical bleeding.  Output from right lateral fasciotomy vac 450 mL past 24 hours.  Right groin incisional vac dry.   Pulm: Room air.   GI: Hiccups.  Will check KUB.  Advanced to regular diet over weekend.  Tolerating PO according to patient and had BM. Renal: AKI likely ATN from Intra-Op hypotension.  Creatinine appears to have peaked and improved today.  3.59 --> 3.42.  UOP 1340  past 24 hours.  Consider diuresis soon. FEN:  K ok at 3.6. ID: Linezolid and zosyn for purulent drainage from left toe prior to surgery.   Vascular: Brisk PT doppler signals bilaterally.  Aspirin and high dose statin.  Continue heparin at 500 units/hr given high risk bypass to both lower extremities requiring multiple trips to OR. Wound to right heel from pressure - OOB to chair today.  PT/OT.  Return to OR likely Wednesday for fasciotomy closure once edema improved.  Not able to be closed at this point.     Cephus Shelling, MD [facility] Office: [phone]

## 2022-04-13 DIAGNOSIS — N170 Acute kidney failure with tubular necrosis: Secondary | ICD-10-CM

## 2022-04-13 DIAGNOSIS — L8990 Pressure ulcer of unspecified site, unspecified stage: Secondary | ICD-10-CM | POA: Insufficient documentation

## 2022-04-13 LAB — HEPARIN LEVEL (UNFRACTIONATED): Heparin Unfractionated: <0.1 [IU]/mL — ABNORMAL LOW (ref 0.30–0.70)

## 2022-04-13 LAB — CBC
HCT: 24.3 % — ABNORMAL LOW (ref 39.0–52.0)
Hemoglobin: 8.5 g/dL — ABNORMAL LOW (ref 13.0–17.0)
MCH: 30.8 pg (ref 26.0–34.0)
MCHC: 35 g/dL (ref 30.0–36.0)
MCV: 88 fL (ref 80.0–100.0)
Platelets: 131 K/uL — ABNORMAL LOW (ref 150–400)
RBC: 2.76 MIL/uL — ABNORMAL LOW (ref 4.22–5.81)
RDW: 17.2 % — ABNORMAL HIGH (ref 11.5–15.5)
WBC: 14.7 K/uL — ABNORMAL HIGH (ref 4.0–10.5)
nRBC: 0.5 % — ABNORMAL HIGH (ref 0.0–0.2)

## 2022-04-13 LAB — BASIC METABOLIC PANEL
Anion gap: 7 (ref 5–15)
BUN: 49 mg/dL — ABNORMAL HIGH (ref 6–20)
CO2: 23 mmol/L (ref 22–32)
Calcium: 7.6 mg/dL — ABNORMAL LOW (ref 8.9–10.3)
Chloride: 112 mmol/L — ABNORMAL HIGH (ref 98–111)
Creatinine, Ser: 3.26 mg/dL — ABNORMAL HIGH (ref 0.61–1.24)
GFR, Estimated: 22 mL/min — ABNORMAL LOW (ref >60)
Glucose, Bld: 114 mg/dL — ABNORMAL HIGH (ref 70–99)
Potassium: 3.1 mmol/L — ABNORMAL LOW (ref 3.5–5.1)
Sodium: 142 mmol/L (ref 135–145)

## 2022-04-13 LAB — MAGNESIUM: Magnesium: 2.3 mg/dL (ref 1.7–2.4)

## 2022-04-13 MED ORDER — POTASSIUM CHLORIDE CRYS ER 20 MEQ PO TBCR
20.0000 meq | EXTENDED_RELEASE_TABLET | Freq: Once | ORAL | Status: AC
  Administered 2022-04-13: 20 meq via ORAL
  Filled 2022-04-13: qty 1

## 2022-04-13 MED ORDER — FUROSEMIDE 10 MG/ML IJ SOLN
40.0000 mg | Freq: Every day | INTRAMUSCULAR | Status: DC
  Administered 2022-04-14 – 2022-04-18 (×5): 40 mg via INTRAVENOUS
  Filled 2022-04-13 (×6): qty 4

## 2022-04-13 NOTE — Progress Notes (Signed)
Physical Therapy Treatment Patient Details Name: Jeffrey Fitzgerald MRN: [id number] DOB: [birth year] Today's Date: 04/13/2022   History of Present Illness Pt is a 51 yo male admitted 04/01/22 from cath lab after aortogram for LLE ischemia. S/p aortobifemoral bypass graft with bilateral femoral endarterectomy on 8/9. Post op RLE ischemia with return to OR same date for redo R groin exploration with thrombectomy and angioplasty. S/p RLE fem-pop bypass graft with 4 compartment fasciotomy on 8/10. S/p thrombectomy left femoral to popliteal BPG wtih closure and right fasciotomy wtih VAC on right LE on 8/11. Ileus with emesis on 8/14. Another sx is planned for 8/16. PMH includes HTN, tobacco smoking, PVD.   PT Comments    Pt slowly progressing with mobility, motivated to participate. Today's session focused on standing trials, pt requiring maxA+2 to stand with RW and stedy lift; pt having difficult translating weight over BLEs due to pain and weakness. Pt remains limited by generalized weakness, decreased activity tolerance, poor balance strategies/postural reactions and impaired cognition. Based on apparent family assist and increased motivation to regain PLOF, feel pt would benefit from intensive CIR-level therapies to maximize functional mobility and independence prior to return home.    Recommendations for follow up therapy are one component of a multi-disciplinary discharge planning process, led by the attending physician.  Recommendations may be updated based on patient status, additional functional criteria and insurance authorization.  Follow Up Recommendations  Acute inpatient rehab (3hours/day) Can patient physically be transported by private vehicle: No   Assistance Recommended at Discharge Frequent or constant Supervision/Assistance  Patient can return home with the following A lot of help with bathing/dressing/bathroom;Assist for transportation;Direct supervision/assist for financial  management;Assistance with cooking/housework;Help with stairs or ramp for entrance;Direct supervision/assist for medications management;Two people to help with walking and/or transfers   Equipment Recommendations   (TBD)    Recommendations for Other Services       Precautions / Restrictions Precautions Precautions: Fall;Other (comment) Precaution Comments: cortrak, wound vac to R groin and calf; watch HR; bowel incontinence Restrictions Weight Bearing Restrictions: No     Mobility  Bed Mobility               General bed mobility comments: Received sitting in recliner    Transfers Overall transfer level: Needs assistance Equipment used: Rolling walker (2 wheels), Ambulation equipment used Transfers: Sit to/from Stand Sit to Stand: Max assist, +2 physical assistance, +2 safety/equipment           General transfer comment: performed 2x sit<>stand from recliner to RW, repeated cues for hand and BLE placement prior to standing, pt repeatedly pulling on RW, maxA+2 for trunk elevation; third standing trial with stedy, maxA+2 but demonstrates improved upright posture and anterior weight shift; prolonged seated rest between standing trials    Ambulation/Gait               General Gait Details: unable   Stairs             Wheelchair Mobility    Modified Rankin (Stroke Patients Only)       Balance   Sitting-balance support: No upper extremity supported, Feet supported Sitting balance-Leahy Scale: Fair     Standing balance support: Bilateral upper extremity supported, During functional activity, Reliant on assistive device for balance Standing balance-Leahy Scale: Poor Standing balance comment: relies on BUE and external support; dependent for posterior pericare  Cognition Arousal/Alertness: Awake/alert Behavior During Therapy: Flat affect Overall Cognitive Status: No family/caregiver present to determine  baseline cognitive functioning Area of Impairment: Attention, Memory, Following commands, Safety/judgement, Awareness, Problem solving                   Current Attention Level: Selective Memory: Decreased short-term memory Following Commands: Follows one step commands with increased time Safety/Judgement: Decreased awareness of deficits Awareness: Emergent Problem Solving: Slow processing, Requires verbal cues, Decreased initiation, Difficulty sequencing General Comments: very slow processing and diffiuclty with problem solving; unsure of baseline- pt incontient of bowel and reports this is new (RN reports incontience earlier this am too) with pt not recalling. Decreased awareness, as reports he is ready to walk and get outside, wants to smoke a cigarette        Exercises      General Comments General comments (skin integrity, edema, etc.): HR up to 152 with activity      Pertinent Vitals/Pain Pain Assessment Pain Assessment: Faces Faces Pain Scale: Hurts even more Pain Location: BLEs Pain Descriptors / Indicators: Guarding, Grimacing Pain Intervention(s): Limited activity within patient's tolerance, Monitored during session, Repositioned    Home Living                          Prior Function            PT Goals (current goals can now be found in the care plan section) Progress towards PT goals: Progressing toward goals    Frequency    Min 3X/week      PT Plan Discharge plan needs to be updated    Co-evaluation PT/OT/SLP Co-Evaluation/Treatment: Yes Reason for Co-Treatment: For patient/therapist safety;To address functional/ADL transfers PT goals addressed during session: Mobility/safety with mobility;Balance;Proper use of DME OT goals addressed during session: ADL's and self-care      AM-PAC PT "6 Clicks" Mobility   Outcome Measure  Help needed turning from your back to your side while in a flat bed without using bedrails?: A Little Help  needed moving from lying on your back to sitting on the side of a flat bed without using bedrails?: A Lot Help needed moving to and from a bed to a chair (including a wheelchair)?: Total Help needed standing up from a chair using your arms (e.g., wheelchair or bedside chair)?: Total Help needed to walk in hospital room?: Total Help needed climbing 3-5 steps with a railing? : Total 6 Click Score: 9    End of Session Equipment Utilized During Treatment: Gait belt Activity Tolerance: Patient tolerated treatment well;Patient limited by pain Patient left: in chair;with call bell/phone within reach Nurse Communication: Mobility status;Need for lift equipment PT Visit Diagnosis: Other abnormalities of gait and mobility (R26.89);Difficulty in walking, not elsewhere classified (R26.2);Muscle weakness (generalized) (M62.81)     Time: 3557-3220 PT Time Calculation (min) (ACUTE ONLY): 28 min  Charges:  $Therapeutic Activity: 8-22 mins                     Ina Homes, PT, DPT Acute Rehabilitation Services  Personal: Secure Chat Rehab Office: [phone]  Malachy Chamber 04/13/2022, 2:31 PM

## 2022-04-13 NOTE — Progress Notes (Signed)
ANTICOAGULATION CONSULT NOTE   Pharmacy Consult for heparin Indication:  PVD  No Known Allergies  Patient Measurements: Height: 5\' 5"  (165.1 cm) Weight: 68.8 kg (151 lb 10.8 oz) IBW/kg (Calculated) : 61.5 Heparin Dosing Weight: 66kg  Vital Signs: Temp: 98.4 F (36.9 C) (08/15 1119) Temp Source: Oral (08/15 1119) BP: 139/88 (08/15 1000) Pulse Rate: 99 (08/15 1000)  Labs: Recent Labs    04/11/22 0437 04/11/22 1906 04/12/22 0406 04/12/22 1729 04/13/22 0440  HGB 7.7*   < > 6.8* 9.1* 8.5*  HCT 22.8*   < > 19.6* 26.4* 24.3*  PLT 125*   < > 136* 138* 131*  LABPROT 15.1  --   --   --   --   INR 1.2  --   --   --   --   HEPARINUNFRC <0.10*  --  <0.10*  --  <0.10*  CREATININE 3.52*   < > 3.42* 3.21* 3.26*   < > = values in this interval not displayed.     Estimated Creatinine Clearance: 23.6 mL/min (A) (by C-G formula based on SCr of 3.26 mg/dL (H)).   Medical History: Past Medical History:  Diagnosis Date   Hypertension    Peripheral vascular disease (HCC)    Tobacco abuse     Medications:  Medications Prior to Admission  Medication Sig Dispense Refill Last Dose   acetaminophen (TYLENOL) 500 MG tablet Take 500 mg by mouth every 6 (six) hours as needed for moderate pain.   03/31/2022   ibuprofen (ADVIL) 200 MG tablet Take 200 mg by mouth every 6 (six) hours as needed for moderate pain.   03/31/2022   Scheduled:   sodium chloride   Intravenous Once   sodium chloride   Intravenous Once   acetaminophen  1,000 mg Oral Once   aspirin EC  81 mg Oral Q0600   atorvastatin  80 mg Oral Daily   Chlorhexidine Gluconate Cloth  6 each Topical Daily   docusate sodium  100 mg Oral Daily   [START ON 04/14/2022] furosemide  40 mg Intravenous Daily   nicotine  21 mg Transdermal Daily   pantoprazole  40 mg Oral Daily   potassium chloride  20 mEq Oral Once   sodium chloride flush  3 mL Intravenous Q12H   Infusions:   sodium chloride     sodium chloride     heparin 500 Units/hr  (04/13/22 1000)   linezolid (ZYVOX) IV 300 mL/hr at 04/13/22 1000   piperacillin-tazobactam (ZOSYN)  IV 12.5 mL/hr at 04/13/22 1000    Assessment: Pt with PVD who is s/p bypass. Heparin was ordered by [facility] at 500 units/hr without titration.   Heparin levels have remained undetectable as expected.  Hgb improved to 8.5 s/p PRBCs yesterday.  Platelet count okay. Discussed with [facility] 8/14, planning to keep heparin at fixed, low rate.  Goal of Therapy:   Monitor platelets by anticoagulation protocol: Yes   Plan:  Heparin 500 units/hr per [facility] Daily heparin and CBC  9/14, Reece Leader, BCPS, BCCP Clinical Pharmacist  04/13/2022 11:22 AM   [facility] pharmacy phone numbers are listed on [email or web address]

## 2022-04-13 NOTE — Progress Notes (Signed)
NAMEHeyward Fitzgerald, MRN:  [id number], DOB:  06-Jul-1971, LOS: 12 ADMISSION DATE:  04/01/2022, CONSULTATION DATE:  04/10/22 REFERRING MD:  Sherral Hammers - [facility] , CHIEF COMPLAINT:  acute kidney injury   History of Present Illness:  51 yo M admitted 8/3 with critical limb ischemia LLE and went to the OR 8/3 for aortobifem bypass, bilateral common femoral endarterectomy w profundoplasty, L common fem to pTib bypass on 8/9. Post op had R foot pain and loss of signals, and went for take back 8/9 for R common femoral artery thrombectomy, endarterectomy R profunda + bovine patch, and revision RLE aortibifem bypass.  8/10 RLE no doppler signal, loss of motor and sensory fxn, back to OR 8/10 for R fem distal bypass with RLE 4x fasciotomy and R posterior tib artery thrombectomy.  8/11 no signals L foot, loss of motor in L foot/ankle. Back to OR 8/11 for thrombectomy L common fem to pTib bypass, RLE fasciotomy closure + wound vac.  EBL 250  8/12 pt with progressive Cr rise and hgb decline.   PCCM consulted in this setting   Pertinent  Medical History  Critical limb ischemia   Significant Hospital Events: Including procedures, antibiotic start and stop dates in addition to other pertinent events   8/3 admitted 8/9 OR aortobifem bypass. Take back for R common fem thrombectomy, endartarectomy, revision RLE aortobifem bypass 8/10 take back R fem distal bypass, RLE 4 compartment fasciotomy 8/11 take back thrombectomy L common fem - PT bypass, RLE fasciotomy closure  8/12 Cr rise to 3.3 Hgb drop to 5.9 PCCM consult  8/13 making good urine, still slight Cr incr.  8/14 creatinine plateau today, making urine, volume up, episode of emesis 8/15 3.5L uop yesterday after lasix, hungry no more n/v  Interim History / Subjective:   Ileus yesterday but moving bowels and hungry today Good diuresis yesterday, 3.5L out Creatinine 3.2 WBC stable Wants out of bed and a diet  Objective   Blood pressure (!) 150/85,  pulse 95, temperature 98.3 F (36.8 C), temperature source Oral, resp. rate 16, height 5\' 5"  (1.651 m), weight 68.8 kg, SpO2 100 %.        Intake/Output Summary (Last 24 hours) at 04/13/2022 0839 Last data filed at 04/13/2022 0700 Gross per 24 hour  Intake 1179.33 ml  Output 3810 ml  Net -2630.67 ml    Filed Weights   04/08/22 1121 04/09/22 0600 04/09/22 0830  Weight: 65.8 kg 68.8 kg 68.8 kg      General: chronically ill-appearing M, resting in bed awake and in no distress HEENT: MM pink/moist, sclera anicteric  Neuro: awake, alert and oriented, moving all extremities CV: s1s2 rrr, no m/r/g PULM:  clear on RA without distress GI: soft, non-distended and non-tender Extremities: warm/dry, bilat LE dressings with R thigh and calf wound vac, warm with palpable pulses, decreased edema from yesterday Skin: no rashes or lesions    Labs reviewed: Creatinine  3.2 K 3.1  Resolved Hospital Problem list     Assessment & Plan:   Critical Limb Ischemia  s/p aortobifem bypass, revision R aortobifem bypass + RLE thrombectomy + endarterectomy, R fem to posterior tib bypass + R pTib thrombectomy + RLE 4 fasciotomy, LLE thrombectomy and RLE fasciotomy closure P -management per primary team -anticipate 10-14d course abx -- zyvox + zosyn -- per [facility]  -fq vasc checks  -cont hep gtt  -back to OR for closure when less volume overloaded, timing per vascular   ABLA Thrombocytopenia  -  8/11 EBL 250  -8/10 EBL 650 ml (+ 4 PRBC 1 plt 2 FFP during case)  -8/9 (1st case)  EBL 3L (+ 700 cellsaver 3 PRBC during case)  -8/9 (take back) EBL 542ml (+ 1 FFP 2 PRBC during case) - think that hgb drop 8/12 was reflective of above losses + hemodilution. Responded well to 2 PRBC. Slight drift 8/13 but no findings concerning for bleed  -8/14 additional 1 unit PRBC's P -stable Hgb today after transfusion yesterday -BID CBC  -serial abd exams     Ileus  N/V -Kub consistent with ileus, moving bowels  this AM without abdominal tenderniess P: -improved after reglan and NPO yesterday, asking for diet, moving bowels -dulcolax and miralax -cautious diet, hold if nausea/vomiting    AKI NAGMA -in setting of above -creatinine plateau and good response to Lasix  P -strict I/O -minimize nephrotoxins as able -continue to trend renal indices   HFpEF Echo 8/4 with preserved EF and Grade I diastolic dysfunction -Decrease Lasix to 40mg  daily -monitor renal function and K  Transaminitis  -hypoperfusion in setting of above  P -trend LFTs intermittently , coags     Best Practice (right click and "Reselect all SmartList Selections" daily)   Diet/type: Regular consistency (see orders),  DVT prophylaxis: systemic heparin GI prophylaxis: PPI Lines: Central line, consider removal after PIV's obtained  Foley:  Yes, and it is still needed Code Status:  full code Last date of multidisciplinary goals of care discussion [per primary ]  Labs   CBC: Recent Labs  Lab 04/09/22 0009 04/10/22 0521 04/11/22 0437 04/11/22 1906 04/12/22 0406 04/12/22 1729 04/13/22 0440  WBC 12.4*   < > 12.5* 13.5* 14.1* 14.6* 14.7*  NEUTROABS 8.6*  --   --   --   --   --   --   HGB 9.8*   < > 7.7* 7.1* 6.8* 9.1* 8.5*  HCT 27.0*   < > 22.8* 20.3* 19.6* 26.4* 24.3*  MCV 84.1   < > 88.4 87.5 88.3 88.0 88.0  PLT 104*   < > 125* 137* 136* 138* 131*   < > = values in this interval not displayed.     Basic Metabolic Panel: Recent Labs  Lab 04/07/22 1540 04/07/22 1753 04/07/22 2350 04/08/22 1314 04/09/22 0009 04/09/22 0625 04/11/22 0437 04/11/22 1906 04/12/22 0406 04/12/22 1729 04/13/22 0440  NA 138   < > 140   < > 138   < > 140 141 140 140 142  K 4.0   < > 4.0   < > 4.0   < > 3.6 3.8 3.6 3.4* 3.1*  CL 112*  --  113*  --  113*   < > 113* 112* 111 112* 112*  CO2 19*  --  19*  --  20*   < > 19* 20* 20* 21* 23  GLUCOSE 201*  --  145*  --  128*   < > 112* 117* 105* 122* 114*  BUN 12  --  17  --  38*    < > 52* 50* 52* 51* 49*  CREATININE 1.11  --  1.51*  --  2.67*   < > 3.52* 3.59* 3.42* 3.21* 3.26*  CALCIUM 6.9*  --  7.8*  --  6.9*   < > 7.4* 7.5* 7.6* 7.5* 7.6*  MG 1.4*  --  1.3*  --  2.2  --   --   --   --   --  2.3   < > =  values in this interval not displayed.    GFR: Estimated Creatinine Clearance: 23.6 mL/min (A) (by C-G formula based on SCr of 3.26 mg/dL (H)). Recent Labs  Lab 04/11/22 1906 04/12/22 0406 04/12/22 1729 04/13/22 0440  WBC 13.5* 14.1* 14.6* 14.7*     Liver Function Tests: Recent Labs  Lab 04/07/22 1540 04/07/22 2350 04/09/22 0009 04/11/22 0437  AST 39 66* 228* 298*  ALT 26 39 55* 69*  ALKPHOS 23* 26* 23* 39  BILITOT 2.0* 2.4* 1.1 0.8  PROT 3.9* 4.4* 4.0* 4.6*  ALBUMIN 2.3* 2.7* 2.4* 2.0*    Recent Labs  Lab 04/07/22 2350  AMYLASE 58    No results for input(s): "AMMONIA" in the last 168 hours.  ABG    Component Value Date/Time   PHART 7.315 (L) 04/08/2022 1444   PCO2ART 38.8 04/08/2022 1444   PO2ART 159 (H) 04/08/2022 1444   HCO3 20.1 04/08/2022 1444   TCO2 21 (L) 04/08/2022 1444   ACIDBASEDEF 6.0 (H) 04/08/2022 1444   O2SAT 99 04/08/2022 1444     Coagulation Profile: Recent Labs  Lab 04/07/22 1540 04/11/22 0437  INR 1.5* 1.2     Cardiac Enzymes: No results for input(s): "CKTOTAL", "CKMB", "CKMBINDEX", "TROPONINI" in the last 168 hours.  HbA1C: Hgb A1c MFr Bld  Date/Time Value Ref Range Status  04/02/2022 03:04 AM 5.6 4.8 - 5.6 % Final    Comment:    (NOTE) Pre diabetes:          5.7%-6.4%  Diabetes:              >6.4%  Glycemic control for   <7.0% adults with diabetes     CBG: Recent Labs  Lab 04/07/22 2233 04/08/22 1727  GLUCAP 143* 104*    Kolt Mcwhirter R Oneisha Ammons, PA-C [facility] See Amion for pager If no response to pager , please call [phone] until 7pm After 7:00 pm call Elink  [phone]

## 2022-04-13 NOTE — Progress Notes (Signed)
Occupational Therapy Treatment Patient Details Name: Jeffrey Fitzgerald MRN: [id number] DOB: 1971/01/24 Today's Date: 04/13/2022   History of present illness 51 yo male admitted 8/3 from cath lab after aortogram for LLE ischemia. 8/9 aortobifem BPG with bil fem endarterectomy. Post op RLE ischemia with return to OR same date for redo right groin exploration with thrombectomy and angioplasty. 8/10 RLE fem-pop BPG with 4 compartment fasciotomy. Pt underwent thrombectomy left femoral to PTBPG wtih closure and right fasciotomy wtih VAC on right LE on 8/11. Another surgery is planned for Wed 8/16.   PMhx: HTN,  tobacco smoking and PVD   OT comments  Pt seated in recliner and agreeable to OT/PT session.  Reports eager to get outside.  Engaged in sit to stand from recliner with max assist +2, cueing for sequencing and hand placement; stood x 2 with RW and x 1 with stedy demonstrating improved upright posture with stedy.  In standing required total assist for hygiene due to incontinence of bowel- pt unaware and unable to recall he was incontinent earlier this morning with nursing. Continues to requires max-total assist +2 for LB ADLs, but min assist for UB ADLS sitting.  Will follow acutely.    Recommendations for follow up therapy are one component of a multi-disciplinary discharge planning process, led by the attending physician.  Recommendations may be updated based on patient status, additional functional criteria and insurance authorization.    Follow Up Recommendations  Acute inpatient rehab (3hours/day)    Assistance Recommended at Discharge Frequent or constant Supervision/Assistance  Patient can return home with the following  Two people to help with walking and/or transfers;A lot of help with bathing/dressing/bathroom;Direct supervision/assist for medications management;Assist for transportation;Help with stairs or ramp for entrance   Equipment Recommendations  Wheelchair (measurements  OT);Wheelchair cushion (measurements OT);BSC/3in1    Recommendations for Other Services Rehab consult    Precautions / Restrictions Precautions Precautions: Fall Precaution Comments: core trac, Rt groin/calf VAC; watch HR Restrictions Weight Bearing Restrictions: No       Mobility Bed Mobility Overal bed mobility: Needs Assistance             General bed mobility comments: OOB in recliner upon entry    Transfers Overall transfer level: Needs assistance Equipment used: Rolling walker (2 wheels), Ambulation equipment used Transfers: Sit to/from Stand Sit to Stand: Max assist, +2 physical assistance, +2 safety/equipment           General transfer comment: max assist +2 using RW 2x and steady 1x. Cueing for hand placement, positioning of LEs and increased time/assist to power up.  Improved upright position with steady.     Balance Overall balance assessment: Needs assistance Sitting-balance support: No upper extremity supported, Feet supported Sitting balance-Leahy Scale: Fair Sitting balance - Comments: statically min guard   Standing balance support: Bilateral upper extremity supported, During functional activity, Reliant on assistive device for balance Standing balance-Leahy Scale: Poor Standing balance comment: relies on BUE and external support                           ADL either performed or assessed with clinical judgement   ADL Overall ADL's : Needs assistance/impaired     Grooming: Set up           Upper Body Dressing : Minimal assistance;Sitting   Lower Body Dressing: Total assistance;Sit to/from stand;+2 for physical assistance;+2 for safety/equipment   Toilet Transfer: Maximal assistance;+2 for physical assistance;+2 for safety/equipment Toilet  Transfer Details (indicate cue type and reason): sit to stand from recliner using RW and steady Toileting- Clothing Manipulation and Hygiene: Total assistance;+2 for physical assistance;+2  for safety/equipment Toileting - Clothing Manipulation Details (indicate cue type and reason): in standing, incontient of bowel with no awareness     Functional mobility during ADLs: Maximal assistance;+2 for physical assistance;+2 for safety/equipment;Rolling walker (2 wheels) (stedy)      Extremity/Trunk Assessment Upper Extremity Assessment Upper Extremity Assessment: Overall WFL for tasks assessed            Vision       Perception     Praxis      Cognition Arousal/Alertness: Awake/alert Behavior During Therapy: Flat affect Overall Cognitive Status: Impaired/Different from baseline Area of Impairment: Memory, Problem solving, Awareness                     Memory: Decreased short-term memory     Awareness: Emergent Problem Solving: Slow processing, Requires verbal cues, Decreased initiation, Difficulty sequencing General Comments: very slow processing and diffiuclty with problem solving; unsure of baseline- pt incontient of bowel and reports this is new (RN reports incontience earlier this am too) with pt not recalling. Decreased awareness, as reports he is ready to walk and get outside.        Exercises      Shoulder Instructions       General Comments HR up to 152 with activity    Pertinent Vitals/ Pain       Pain Assessment Pain Assessment: Faces Faces Pain Scale: Hurts even more Pain Location: LES Pain Descriptors / Indicators: Guarding, Grimacing Pain Intervention(s): Limited activity within patient's tolerance, Monitored during session, Repositioned  Home Living                                          Prior Functioning/Environment              Frequency  Min 2X/week        Progress Toward Goals  OT Goals(current goals can now be found in the care plan section)  Progress towards OT goals: Progressing toward goals  Acute Rehab OT Goals Patient Stated Goal: home OT Goal Formulation: With patient Time For  Goal Achievement: 04/25/22 Potential to Achieve Goals: Good  Plan Discharge plan remains appropriate;Frequency remains appropriate    Co-evaluation    PT/OT/SLP Co-Evaluation/Treatment: Yes Reason for Co-Treatment: For patient/therapist safety;To address functional/ADL transfers   OT goals addressed during session: ADL's and self-care      AM-PAC OT "6 Clicks" Daily Activity     Outcome Measure   Help from another person eating meals?: None Help from another person taking care of personal grooming?: A Little Help from another person toileting, which includes using toliet, bedpan, or urinal?: Total Help from another person bathing (including washing, rinsing, drying)?: A Lot Help from another person to put on and taking off regular upper body clothing?: A Little Help from another person to put on and taking off regular lower body clothing?: Total 6 Click Score: 14    End of Session Equipment Utilized During Treatment: Gait belt;Rolling walker (2 wheels)  OT Visit Diagnosis: Unsteadiness on feet (R26.81);Other abnormalities of gait and mobility (R26.89);Muscle weakness (generalized) (M62.81);Other symptoms and signs involving cognitive function;Pain Pain - Right/Left:  (B) Pain - part of body: Leg   Activity Tolerance Patient tolerated treatment  well   Patient Left in chair;with call bell/phone within reach;with chair alarm set   Nurse Communication Mobility status        Time: [id number] OT Time Calculation (min): 30 min  Charges: OT General Charges $OT Visit: 1 Visit OT Treatments $Self Care/Home Management : 8-22 mins  [place] Office [phone]   Delight Stare 04/13/2022, 12:11 PM

## 2022-04-13 NOTE — Progress Notes (Addendum)
Progress Note    04/13/2022 7:47 AM 4 Days Post-Op  Subjective:  asking for cigarettes and his scooter   Vitals:   04/13/22 0600 04/13/22 0700  BP: (!) 141/89 (!) 150/85  Pulse: 96 95  Resp: 13 16  Temp:    SpO2: 100% 100%   Physical Exam: Cardiac:  regular Lungs:  non labored Incisions:  laparotomy incision, b groin incisions, and extremity incisions are intact and well appearing. B fasciotomy sites dressed. VAC with SS output Extremities:  well perfused and warm with doppler DP and PT signals Abdomen:  flat, soft Neurologic: alert and oriented   CBC    Component Value Date/Time   WBC 14.7 (H) 04/13/2022 0440   RBC 2.76 (L) 04/13/2022 0440   HGB 8.5 (L) 04/13/2022 0440   HCT 24.3 (L) 04/13/2022 0440   PLT 131 (L) 04/13/2022 0440   MCV 88.0 04/13/2022 0440   MCH 30.8 04/13/2022 0440   MCHC 35.0 04/13/2022 0440   RDW 17.2 (H) 04/13/2022 0440   LYMPHSABS 2.0 04/09/2022 0009   MONOABS 1.7 (H) 04/09/2022 0009   EOSABS 0.0 04/09/2022 0009   BASOSABS 0.0 04/09/2022 0009    BMET    Component Value Date/Time   NA 142 04/13/2022 0440   K 3.1 (L) 04/13/2022 0440   CL 112 (H) 04/13/2022 0440   CO2 23 04/13/2022 0440   GLUCOSE 114 (H) 04/13/2022 0440   BUN 49 (H) 04/13/2022 0440   CREATININE 3.26 (H) 04/13/2022 0440   CALCIUM 7.6 (L) 04/13/2022 0440   GFRNONAA 22 (L) 04/13/2022 0440   GFRAA >60 01/12/2020 1421    INR    Component Value Date/Time   INR 1.2 04/11/2022 0437     Intake/Output Summary (Last 24 hours) at 04/13/2022 0747 Last data filed at 04/13/2022 0700 Gross per 24 hour  Intake 1496.84 ml  Output 3810 ml  Net -2313.16 ml     Assessment/Plan:  51 y.o. male is s/p  1.  Thrombectomy of left common femoral to posterior tibial artery bypass 2.  Closure of right medial calf fasciotomy 3.  Application of negative pressure wound VAC greater right lateral calf fasciotomy  4 Days Post-Op    Bilateral lower extremities are well perfused and warm.  Doppler Dp/PT signals. Improves motor and sensation Incisions are all intact and well appearing Pain overall well controlled Tolerating regular diet No flatus or BM. Continue Bowel Regimen Scr slowly improving 3.26 this morning Strict I & O's Diurese  H&H stable post transfusion Will need fasciotomy closure once swelling improves Continue PT/OT Dispo SNF vs CIR pending continued therapy   DVT prophylaxis:  heparin gtt   Jeffrey Congress, PA-C [facility] [phone] 04/13/2022 7:47 AM  I have seen and evaluated the patient. I agree with the PA note as documented above. Now status post aortobifemoral bypass with bilateral femoral to PT bypasses for CLI with tissue loss.  Stable in the ICU.   Neuro: Pain well controlled.  Able to wiggle his toes in both feet.  Really limited mobility prior to surgery.  Therapy recommending CIR. CV: Hemodynamically stable.  Hemoglobin 9.1 after 2upRBCs yesterday.  No signs of surgical bleeding.  Output from right lateral fasciotomy vac 400 mL past 24 hours.  Right groin incisional vac dry.   Pulm: Room air.   GI: Regular diet. Renal: AKI likely ATN from Intra-Op hypotension.  Creatinine appears to have peaked and improved yesterday.  3.59 --> 3.42.  UOP 3535 past 24 hours  with diuresis. ID: Linezolid and zosyn for purulent drainage from left toe prior to surgery.   Vascular: Brisk PT doppler signals bilaterally.  Aspirin and high dose statin.  Continue heparin at 500 units/hr given high risk bypass to both lower extremities requiring multiple trips to OR. Wound to right heel from pressure - OOB to chair today.  PT/OT.      Jeffrey Shelling, MD [facility] [place] Office: [phone]

## 2022-04-13 NOTE — Progress Notes (Signed)
Progress Note  Patient Name: Jeffrey Fitzgerald Date of Encounter: 04/13/2022  Primary Cardiologist:   Rollene Rotunda, MD   Subjective   He wants to go outside.  Mild incisional leg pain.   Inpatient Medications    Scheduled Meds:  sodium chloride   Intravenous Once   sodium chloride   Intravenous Once   acetaminophen  1,000 mg Oral Once   aspirin EC  81 mg Oral Q0600   atorvastatin  80 mg Oral Daily   Chlorhexidine Gluconate Cloth  6 each Topical Daily   docusate sodium  100 mg Oral Daily   furosemide  40 mg Intravenous BID   nicotine  21 mg Transdermal Daily   pantoprazole  40 mg Oral Daily   sodium chloride flush  3 mL Intravenous Q12H   Continuous Infusions:  sodium chloride     sodium chloride     heparin 500 Units/hr (04/13/22 0700)   linezolid (ZYVOX) IV Stopped (04/12/22 2247)   piperacillin-tazobactam (ZOSYN)  IV Stopped (04/13/22 0600)   PRN Meds: sodium chloride, sodium chloride, acetaminophen **OR** acetaminophen, bisacodyl, Gerhardt's butt cream, hydrALAZINE, labetalol, morphine injection, ondansetron, mouth rinse, oxyCODONE-acetaminophen, phenol, polyethylene glycol, potassium chloride, sodium chloride flush   Vital Signs    Vitals:   04/13/22 0500 04/13/22 0600 04/13/22 0700 04/13/22 0751  BP: (!) 141/70 (!) 141/89 (!) 150/85   Pulse: 93 96 95   Resp: 13 13 16    Temp:    98.3 F (36.8 C)  TempSrc:    Oral  SpO2: 99% 100% 100%   Weight:      Height:        Intake/Output Summary (Last 24 hours) at 04/13/2022 0803 Last data filed at 04/13/2022 0700 Gross per 24 hour  Intake 1494.33 ml  Output 3810 ml  Net -2315.67 ml   Filed Weights   04/08/22 1121 04/09/22 0600 04/09/22 0830  Weight: 65.8 kg 68.8 kg 68.8 kg    Telemetry    NSR, ST - Personally Reviewed  ECG    NA - Personally Reviewed  Physical Exam   GEN: No acute distress.   Neck: No  JVD Cardiac: RRR, no murmurs, rubs, or gallops.  Respiratory: Clear  to auscultation  bilaterally. GI: Soft, nontender, non-distended  MS: No  edema; No deformity.  Absent DP/PT with dressed wounds Neuro:  Nonfocal  Psych: Normal affect   Labs    Chemistry Recent Labs  Lab 04/07/22 2350 04/08/22 1314 04/09/22 0009 04/09/22 06/09/22 04/11/22 0437 04/11/22 1906 04/12/22 0406 04/12/22 1729 04/13/22 0440  NA 140   < > 138   < > 140   < > 140 140 142  K 4.0   < > 4.0   < > 3.6   < > 3.6 3.4* 3.1*  CL 113*  --  113*   < > 113*   < > 111 112* 112*  CO2 19*  --  20*   < > 19*   < > 20* 21* 23  GLUCOSE 145*  --  128*   < > 112*   < > 105* 122* 114*  BUN 17  --  38*   < > 52*   < > 52* 51* 49*  CREATININE 1.51*  --  2.67*   < > 3.52*   < > 3.42* 3.21* 3.26*  CALCIUM 7.8*  --  6.9*   < > 7.4*   < > 7.6* 7.5* 7.6*  PROT 4.4*  --  4.0*  --  4.6*  --   --   --   --   ALBUMIN 2.7*  --  2.4*  --  2.0*  --   --   --   --   AST 66*  --  228*  --  298*  --   --   --   --   ALT 39  --  55*  --  69*  --   --   --   --   ALKPHOS 26*  --  23*  --  39  --   --   --   --   BILITOT 2.4*  --  1.1  --  0.8  --   --   --   --   GFRNONAA 56*  --  28*   < > 20*   < > 21* 23* 22*  ANIONGAP 8  --  5   < > 8   < > 9 7 7    < > = values in this interval not displayed.     Hematology Recent Labs  Lab 04/12/22 0406 04/12/22 1729 04/13/22 0440  WBC 14.1* 14.6* 14.7*  RBC 2.22* 3.00* 2.76*  HGB 6.8* 9.1* 8.5*  HCT 19.6* 26.4* 24.3*  MCV 88.3 88.0 88.0  MCH 30.6 30.3 30.8  MCHC 34.7 34.5 35.0  RDW 15.6* 16.9* 17.2*  PLT 136* 138* 131*    Cardiac EnzymesNo results for input(s): "TROPONINI" in the last 168 hours. No results for input(s): "TROPIPOC" in the last 168 hours.   BNPNo results for input(s): "BNP", "PROBNP" in the last 168 hours.   DDimer No results for input(s): "DDIMER" in the last 168 hours.   Radiology    DG Abd Portable 1V  Result Date: 04/12/2022 CLINICAL DATA:  Abdominal pain EXAM: PORTABLE ABDOMEN - 1 VIEW COMPARISON:  Abdominal radiograph dated April 07, 2022  FINDINGS: Mildly dilated gas-filled loops of small and large bowel with air seen in the rectum. Acute osseous abnormality. Skin closure staples of the right groin. IMPRESSION: Mildly dilated loops small and large bowel, likely due to ileus. Electronically Signed   By: Yetta Glassman M.D.   On: 04/12/2022 08:19    Cardiac Studies   TTE 04/02/2022   1. Left ventricular ejection fraction, by estimation, is 60 to 65%. The  left ventricle has normal function. The left ventricle demonstrates  regional wall motion abnormalities with basal inferior hypokinesis. There  is mild left ventricular hypertrophy.  Left ventricular diastolic parameters are consistent with Grade I  diastolic dysfunction (impaired relaxation).   2. Right ventricular systolic function is normal. The right ventricular  size is normal. Tricuspid regurgitation signal is inadequate for assessing  PA pressure.   3. The aortic valve is tricuspid. Aortic valve regurgitation is not  visualized. No aortic stenosis is present.   4. The mitral valve is normal in structure. No evidence of mitral valve  regurgitation. No evidence of mitral stenosis.   5. The inferior vena cava is normal in size with greater than 50%  respiratory variability, suggesting right atrial pressure of 3 mmHg.     Patient Profile     51 y.o. male  with hypertension, tobacco abuse, PAD who was admitted on 04/01/2022 with critical limb ischemia.  He is status post aortobifem bypass with bifemoral endarterectomy with left femoral to PT bypass on 04/07/2022.  Course complicated by acute thrombus of the right common femoral artery on 04/07/2022 status post thrombectomy.  Taken back to the OR on  04/08/2022 for right femoral to PT bypass with right lower extremity fasciotomy.  Status post left lower extremity fasciotomy and thrombectomy of the left femoral PT bypass on 04/09/2022.  Cardiology was consulted for hypertension management.  Course is now complicated by acute kidney  injury as well as anemia.    Assessment & Plan    Acute critical limb ischemia:   Surgeries as listed with plans for fasciotomy closure once swelling improves.      AKI:    Creat is elevated but unchanged. Avoiding nephrotoxic agents.   HTN:   BPs are borderline but allowing some permissive HTN for perfusion.  Would add low dose hydralazine if BPs remain continue to rise.      For questions or updates, please contact [facility] Please consult [email or web address] for contact info under Cardiology/STEMI.   Signed, Rollene Rotunda, MD  04/13/2022, 8:03 AM

## 2022-04-13 NOTE — Progress Notes (Signed)
Inpatient Rehab Admissions Coordinator:   At this time pt appears to be a potential candidate for CIR. I will place a consult order for full assessment, per our protocol.   Estill Dooms, PT, DPT Admissions Coordinator [phone] 04/13/22  4:45 PM

## 2022-04-13 NOTE — TOC Progression Note (Signed)
Transition of Care [facility]) - Progression Note    Patient Details  Name: Jeffrey Fitzgerald MRN: [id number] Date of Birth: Mar 09, 1971  Transition of [facility]) CM/SW Contact  Graves-Bigelow, Lamar Laundry, RN Phone Number: 04/13/2022, 3:02 PM  Clinical Narrative: Case Manager spoke with the patient and he states he is from home with sister. Patient provided Case Manager verbal permission to call sister Jeffrey Fitzgerald. Case Manager attempted to call Jeffrey Fitzgerald for clarification of home situation and received her voicemail. Patient presented with critical limb ischemia-post bypass and RLE fasciotomy closure. Patient has wound vac intact. PT/OT recommendations are for CIR- Inpatient Rehab Admissions Coordinator is following the patient. Case Manager will continue to try and contact sister.   Expected Discharge Plan: IP Rehab Facility Barriers to Discharge: Continued Medical Work up  Expected Discharge Plan and Services Expected Discharge Plan: IP Rehab Facility In-house Referral: Clinical Social Work Discharge Planning Services: CM Consult   Living arrangements for the past 2 months: Single Family Home  Readmission Risk Interventions     No data to display

## 2022-04-14 LAB — BASIC METABOLIC PANEL
Anion gap: 8 (ref 5–15)
BUN: 44 mg/dL — ABNORMAL HIGH (ref 6–20)
CO2: 23 mmol/L (ref 22–32)
Calcium: 7.8 mg/dL — ABNORMAL LOW (ref 8.9–10.3)
Chloride: 110 mmol/L (ref 98–111)
Creatinine, Ser: 2.94 mg/dL — ABNORMAL HIGH (ref 0.61–1.24)
GFR, Estimated: 25 mL/min — ABNORMAL LOW (ref >60)
Glucose, Bld: 113 mg/dL — ABNORMAL HIGH (ref 70–99)
Potassium: 3.2 mmol/L — ABNORMAL LOW (ref 3.5–5.1)
Sodium: 141 mmol/L (ref 135–145)

## 2022-04-14 LAB — CBC
HCT: 21.6 % — ABNORMAL LOW (ref 39.0–52.0)
Hemoglobin: 7.5 g/dL — ABNORMAL LOW (ref 13.0–17.0)
MCH: 30.6 pg (ref 26.0–34.0)
MCHC: 34.7 g/dL (ref 30.0–36.0)
MCV: 88.2 fL (ref 80.0–100.0)
Platelets: 149 10*3/uL — ABNORMAL LOW (ref 150–400)
RBC: 2.45 MIL/uL — ABNORMAL LOW (ref 4.22–5.81)
RDW: 16.4 % — ABNORMAL HIGH (ref 11.5–15.5)
WBC: 16.1 10*3/uL — ABNORMAL HIGH (ref 4.0–10.5)
nRBC: 0.4 % — ABNORMAL HIGH (ref 0.0–0.2)

## 2022-04-14 LAB — HEPARIN LEVEL (UNFRACTIONATED): Heparin Unfractionated: <0.1 IU/mL — ABNORMAL LOW (ref 0.30–0.70)

## 2022-04-14 MED ORDER — MORPHINE SULFATE (PF) 2 MG/ML IV SOLN
4.0000 mg | INTRAVENOUS | Status: DC | PRN
  Administered 2022-04-16 – 2022-04-18 (×2): 4 mg via INTRAVENOUS
  Filled 2022-04-14 (×2): qty 2

## 2022-04-14 NOTE — Progress Notes (Addendum)
Progress Note    04/14/2022 8:23 AM 5 Days Post-Op  Subjective:  no complaints. Very sleepy   Vitals:   04/14/22 0600 04/14/22 0747  BP: (!) 163/75   Pulse: 99   Resp: 11   Temp:  98.3 F (36.8 C)  SpO2: 100%    Physical Exam: Cardiac:  regular Lungs:  non labored Incisions:  abdominal incision and bilateral groin incisions are c/d/I. Legs are dressed. SS drainage from VAC. Dressings with serous drainage Extremities:  well perfused and warm with doppler PT/ Dp signals Abdomen:  soft, non distended Neurologic: sleepy but responds appropriately  CBC    Component Value Date/Time   WBC 16.1 (H) 04/14/2022 0530   RBC 2.45 (L) 04/14/2022 0530   HGB 7.5 (L) 04/14/2022 0530   HCT 21.6 (L) 04/14/2022 0530   PLT 149 (L) 04/14/2022 0530   MCV 88.2 04/14/2022 0530   MCH 30.6 04/14/2022 0530   MCHC 34.7 04/14/2022 0530   RDW 16.4 (H) 04/14/2022 0530   LYMPHSABS 2.0 04/09/2022 0009   MONOABS 1.7 (H) 04/09/2022 0009   EOSABS 0.0 04/09/2022 0009   BASOSABS 0.0 04/09/2022 0009    BMET    Component Value Date/Time   NA 141 04/14/2022 0530   K 3.2 (L) 04/14/2022 0530   CL 110 04/14/2022 0530   CO2 23 04/14/2022 0530   GLUCOSE 113 (H) 04/14/2022 0530   BUN 44 (H) 04/14/2022 0530   CREATININE 2.94 (H) 04/14/2022 0530   CALCIUM 7.8 (L) 04/14/2022 0530   GFRNONAA 25 (L) 04/14/2022 0530   GFRAA >60 01/12/2020 1421    INR    Component Value Date/Time   INR 1.2 04/11/2022 0437     Intake/Output Summary (Last 24 hours) at 04/14/2022 0823 Last data filed at 04/14/2022 0600 Gross per 24 hour  Intake 920.62 ml  Output 2525 ml  Net -1604.38 ml     Assessment/Plan:  51 y.o. male is s/p 1.  Thrombectomy of left common femoral to posterior tibial artery bypass 2.  Closure of right medial calf fasciotomy 3.  Application of negative pressure wound VAC greater right lateral calf fasciotomy 5 Days Post-Op   Bilateral lower extremities are well perfused and warm with Doppler  DP/PT signals Hgb 7.5 this morning. Asymptomatic. Will continue to monitor SS drainage from the [facility]. 250 cc in canister Plan for dressing changes later today  Tolerating diet Scr continues to improve 2.94 Afebrile. WBC is trending up 16.1 this morning  On Zosyn and Linezolid Continue to mobilize PT/OT Dispo CIR when medically ready   DVT prophylaxis: heparin Gtt  Jeffrey Congress, PA-C [facility] [phone] 04/14/2022 8:23 AM  I have seen and evaluated the patient. I agree with the PA note as documented above. Now status post aortobifemoral bypass with bilateral femoral to PT bypasses for CLI with tissue loss.  Stable in the ICU.   Neuro: Pain well controlled.  Able to wiggle his toes in both feet.  Really limited mobility prior to surgery.  Therapy recommending CIR. CV: Hemodynamically stable.  Hemoglobin 7.5.  No signs of surgical bleeding.  Output from right lateral fasciotomy vac 250 mL past 24 hours.  Also some oozing right medal calf incision.   Pulm: Room air.   GI: Regular diet.  Having bowel function. Renal: AKI likely ATN from Intra-Op hypotension.  Cr improving.  Diuresis 40 mg Lasix daily. ID: Linezolid and zosyn for purulent drainage from left toe prior to surgery.   Vascular: Brisk  PT doppler signals bilaterally.  Aspirin and high dose statin.  Continue heparin at 500 units/hr given high risk bypass to both lower extremities requiring multiple trips to OR. Wound to right heel from pressure   Transfer to floor.  Looks good.  Needs aggressive therapy.     Cephus Shelling, MD [facility] [place] Office: [phone]

## 2022-04-14 NOTE — Progress Notes (Signed)
NAMETayson Fitzgerald, MRN:  [id number], DOB:  1970/09/15, LOS: 13 ADMISSION DATE:  04/01/2022, CONSULTATION DATE:  04/10/22 REFERRING MD:  Sherral Hammers - [facility] , CHIEF COMPLAINT:  acute kidney injury   History of Present Illness:  51 yo M admitted 8/3 with critical limb ischemia LLE and went to the OR 8/3 for aortobifem bypass, bilateral common femoral endarterectomy w profundoplasty, L common fem to pTib bypass on 8/9. Post op had R foot pain and loss of signals, and went for take back 8/9 for R common femoral artery thrombectomy, endarterectomy R profunda + bovine patch, and revision RLE aortibifem bypass.  8/10 RLE no doppler signal, loss of motor and sensory fxn, back to OR 8/10 for R fem distal bypass with RLE 4x fasciotomy and R posterior tib artery thrombectomy.  8/11 no signals L foot, loss of motor in L foot/ankle. Back to OR 8/11 for thrombectomy L common fem to pTib bypass, RLE fasciotomy closure + wound vac.  EBL 250  8/12 pt with progressive Cr rise and hgb decline.   PCCM consulted in this setting   Pertinent  Medical History  Critical limb ischemia   Significant Hospital Events: Including procedures, antibiotic start and stop dates in addition to other pertinent events   8/3 admitted 8/9 OR aortobifem bypass. Take back for R common fem thrombectomy, endartarectomy, revision RLE aortobifem bypass 8/10 take back R fem distal bypass, RLE 4 compartment fasciotomy 8/11 take back thrombectomy L common fem - PT bypass, RLE fasciotomy closure  8/12 Cr rise to 3.3 Hgb drop to 5.9 PCCM consult  8/13 making good urine, still slight Cr incr.  8/14 creatinine plateau today, making urine, volume up, episode of emesis 8/15 3.5L uop yesterday after lasix, hungry no more n/v 8/16 still diuresing well, plan for R closure tomorrow  Interim History / Subjective:  No overnight events or complaints Denies n/v Tolerating diet  Objective   Blood pressure 136/80, pulse (!) 109, temperature 98.3  F (36.8 C), temperature source Oral, resp. rate 15, height 5\' 5"  (1.651 m), weight 68.8 kg, SpO2 99 %.        Intake/Output Summary (Last 24 hours) at 04/14/2022 1000 Last data filed at 04/14/2022 0900 Gross per 24 hour  Intake 937.12 ml  Output 2525 ml  Net -1587.88 ml    Filed Weights   04/08/22 1121 04/09/22 0600 04/09/22 0830  Weight: 65.8 kg 68.8 kg 68.8 kg      General: chronically ill-appearing M, resting in bed awake and in no distress HEENT: MM pink/moist, sclera anicteric  Neuro: awake, alert and oriented, moving all extremities, conversing CV: s1s2 rrr, no m/r/g PULM:  clear bilaterally on RA  GI: soft, non-distended and non-tender Extremities: warm/dry, bilat LE dressings with R thigh and calf wound vac, warm with palpable pulses, 1+ edema, R calf dressing with bloody serosanguinous drainage Skin: no rashes or lesions    Labs reviewed: Creatinine  2.9 K 3.2  Resolved Hospital Problem list     Assessment & Plan:   Critical Limb Ischemia  s/p aortobifem bypass, revision R aortobifem bypass + RLE thrombectomy + endarterectomy, R fem to posterior tib bypass + R pTib thrombectomy + RLE 4 fasciotomy, LLE thrombectomy and RLE fasciotomy closure P -management per primary team -anticipate 10-14d course abx -- zyvox + zosyn -- per VVS  -fq vasc checks  -cont hep gtt  -back to OR for closure tomorrow  ABLA Thrombocytopenia  -8/11 EBL 250  -8/10 EBL 650 ml (+  4 PRBC 1 plt 2 FFP during case)  -8/9 (1st case)  EBL 3L (+ 700 cellsaver 3 PRBC during case)  -8/9 (take back) EBL (+ 1 FFP 2 PRBC during case) - think that hgb drop 8/12 was reflective of above losses + hemodilution. Responded well to 2 PRBC. Slight drift 8/13 but no findings concerning for bleed  -8/14 additional 1 unit PRBC's P -Hgb 7.5 -BID CBC, transfuse prn  -serial abd exams     Ileus  N/V -Kub consistent with ileus, moving bowels this AM without abdominal tenderniess P: -improved  denies n/n -dulcolax and miralax -continue diet    AKI NAGMA -in setting of above -creatinine plateau and good response to Lasix  P -creatinine continues to down-trend to 2.9 today with good UOP -minimize nephrotoxins as able -continue to trend renal indices   HFpEF Hypokalemia Echo 8/4 with preserved EF and Grade I diastolic dysfunction -continue Lasix to 40mg  daily -monitor renal function and K and replete as needed  Transaminitis  -hypoperfusion in setting of above  P -trend LFTs intermittently , coags     Best Practice (right click and "Reselect all SmartList Selections" daily)   Diet/type: Regular consistency (see orders),  DVT prophylaxis: systemic heparin GI prophylaxis: PPI Lines: Central line, consider removal after PIV's obtained  Foley:  Yes, and it is still needed Code Status:  full code Last date of multidisciplinary goals of care discussion [per primary ]  Labs   CBC: Recent Labs  Lab 04/09/22 0009 04/10/22 0521 04/11/22 1906 04/12/22 0406 04/12/22 1729 04/13/22 0440 04/14/22 0530  WBC 12.4*   < > 13.5* 14.1* 14.6* 14.7* 16.1*  NEUTROABS 8.6*  --   --   --   --   --   --   HGB 9.8*   < > 7.1* 6.8* 9.1* 8.5* 7.5*  HCT 27.0*   < > 20.3* 19.6* 26.4* 24.3* 21.6*  MCV 84.1   < > 87.5 88.3 88.0 88.0 88.2  PLT 104*   < > 137* 136* 138* 131* 149*   < > = values in this interval not displayed.     Basic Metabolic Panel: Recent Labs  Lab 04/07/22 1540 04/07/22 1753 04/07/22 2350 04/08/22 1314 04/09/22 0009 04/09/22 0625 04/11/22 1906 04/12/22 0406 04/12/22 1729 04/13/22 0440 04/14/22 0530  NA 138   < > 140   < > 138   < > 141 140 140 142 141  K 4.0   < > 4.0   < > 4.0   < > 3.8 3.6 3.4* 3.1* 3.2*  CL 112*  --  113*  --  113*   < > 112* 111 112* 112* 110  CO2 19*  --  19*  --  20*   < > 20* 20* 21* 23 23  GLUCOSE 201*  --  145*  --  128*   < > 117* 105* 122* 114* 113*  BUN 12  --  17  --  38*   < > 50* 52* 51* 49* 44*  CREATININE 1.11   --  1.51*  --  2.67*   < > 3.59* 3.42* 3.21* 3.26* 2.94*  CALCIUM 6.9*  --  7.8*  --  6.9*   < > 7.5* 7.6* 7.5* 7.6* 7.8*  MG 1.4*  --  1.3*  --  2.2  --   --   --   --  2.3  --    < > = values in this interval not displayed.  GFR: Estimated Creatinine Clearance: 26.1 mL/min (A) (by C-G formula based on SCr of 2.94 mg/dL (H)). Recent Labs  Lab 04/12/22 0406 04/12/22 1729 04/13/22 0440 04/14/22 0530  WBC 14.1* 14.6* 14.7* 16.1*     Liver Function Tests: Recent Labs  Lab 04/07/22 1540 04/07/22 2350 04/09/22 0009 04/11/22 0437  AST 39 66* 228* 298*  ALT 26 39 55* 69*  ALKPHOS 23* 26* 23* 39  BILITOT 2.0* 2.4* 1.1 0.8  PROT 3.9* 4.4* 4.0* 4.6*  ALBUMIN 2.3* 2.7* 2.4* 2.0*    Recent Labs  Lab 04/07/22 2350  AMYLASE 58    No results for input(s): "AMMONIA" in the last 168 hours.  ABG    Component Value Date/Time   PHART 7.315 (L) 04/08/2022 1444   PCO2ART 38.8 04/08/2022 1444   PO2ART 159 (H) 04/08/2022 1444   HCO3 20.1 04/08/2022 1444   TCO2 21 (L) 04/08/2022 1444   ACIDBASEDEF 6.0 (H) 04/08/2022 1444   O2SAT 99 04/08/2022 1444     Coagulation Profile: Recent Labs  Lab 04/07/22 1540 04/11/22 0437  INR 1.5* 1.2     Cardiac Enzymes: No results for input(s): "CKTOTAL", "CKMB", "CKMBINDEX", "TROPONINI" in the last 168 hours.  HbA1C: Hgb A1c MFr Bld  Date/Time Value Ref Range Status  04/02/2022 03:04 AM 5.6 4.8 - 5.6 % Final    Comment:    (NOTE) Pre diabetes:          5.7%-6.4%  Diabetes:              >6.4%  Glycemic control for   <7.0% adults with diabetes     CBG: Recent Labs  Lab 04/07/22 2233 04/08/22 1727  GLUCAP 143* 104*    Krrish Freund R Heitor Steinhoff, PA-C [facility] See Amion for pager If no response to pager , please call [phone] until 7pm After 7:00 pm call Elink  [phone]?[place]

## 2022-04-14 NOTE — Progress Notes (Signed)
Patient brought to 4E from 2H. VSS. Telemetry box applied, CCMD notified. Patient states pain scale 0/10. Patient oriented to room and staff. Call bell in reach.  [name], RN

## 2022-04-14 NOTE — Progress Notes (Signed)
Inpatient Rehab Admissions Coordinator:    I met with pt. To discuss potential CIR admit. He states interest and is agreeable to cost. States his sister and cousin can provide significant physical assist and 24/7 support at d/c. I will reach out to family to confirm this and pursue for admission pending medical readiness.   Clemens Catholic, [place], [place] Admissions Coordinator  [phone] ([place]) [phone] (office)

## 2022-04-15 LAB — CBC
HCT: 19.2 % — ABNORMAL LOW (ref 39.0–52.0)
Hemoglobin: 6.6 g/dL — CL (ref 13.0–17.0)
MCH: 31.3 pg (ref 26.0–34.0)
MCHC: 34.4 g/dL (ref 30.0–36.0)
MCV: 91 fL (ref 80.0–100.0)
Platelets: 164 10*3/uL (ref 150–400)
RBC: 2.11 MIL/uL — ABNORMAL LOW (ref 4.22–5.81)
RDW: 16.2 % — ABNORMAL HIGH (ref 11.5–15.5)
WBC: 15.4 10*3/uL — ABNORMAL HIGH (ref 4.0–10.5)
nRBC: 0.4 % — ABNORMAL HIGH (ref 0.0–0.2)

## 2022-04-15 LAB — BASIC METABOLIC PANEL
Anion gap: 9 (ref 5–15)
BUN: 43 mg/dL — ABNORMAL HIGH (ref 6–20)
CO2: 23 mmol/L (ref 22–32)
Calcium: 8 mg/dL — ABNORMAL LOW (ref 8.9–10.3)
Chloride: 110 mmol/L (ref 98–111)
Creatinine, Ser: 2.82 mg/dL — ABNORMAL HIGH (ref 0.61–1.24)
GFR, Estimated: 26 mL/min — ABNORMAL LOW (ref >60)
Glucose, Bld: 110 mg/dL — ABNORMAL HIGH (ref 70–99)
Potassium: 3.5 mmol/L (ref 3.5–5.1)
Sodium: 142 mmol/L (ref 135–145)

## 2022-04-15 LAB — HEPARIN LEVEL (UNFRACTIONATED): Heparin Unfractionated: <0.1 IU/mL — ABNORMAL LOW (ref 0.30–0.70)

## 2022-04-15 LAB — PREPARE RBC (CROSSMATCH)

## 2022-04-15 LAB — MAGNESIUM: Magnesium: 2.3 mg/dL (ref 1.7–2.4)

## 2022-04-15 MED ORDER — SODIUM CHLORIDE 0.9% IV SOLUTION: Freq: Once | INTRAVENOUS | Status: DC

## 2022-04-15 MED ORDER — POTASSIUM CHLORIDE CRYS ER 20 MEQ PO TBCR
20.0000 meq | EXTENDED_RELEASE_TABLET | Freq: Once | ORAL | Status: AC
  Administered 2022-04-15: 20 meq via ORAL
  Filled 2022-04-15: qty 1

## 2022-04-15 NOTE — Progress Notes (Signed)
Pharmacy Antibiotic Note  Jeffrey Fitzgerald is a 51 y.o. male admitted on 04/01/2022 with critical limb ischemia and LLE wound with drainage.  Pharmacy has been consulted for Vancomycin>linezolid / Zosyn dosing.  Last day of antibiotics -received 14 days. WBC 15, afebrile. Scr trending down to 2.82 (CrCl 27 mL/min).   Plan: Zyvox 600mg  IV q12 Continues on Zosyn 3.375 gm IV q8h (4 hour infusion). Today is day 14 days of antibiotics - request to call Dr before discharge   Height: 5\' 5"  (165.1 cm) Weight: 68.8 kg (151 lb 10.8 oz) IBW/kg (Calculated) : 61.5  Temp (24hrs), Avg:97.8 F (36.6 C), Min:97.3 F (36.3 C), Max:98.5 F (36.9 C)  Recent Labs  Lab 04/12/22 0406 04/12/22 1729 04/13/22 0440 04/14/22 0530 04/15/22 0252  WBC 14.1* 14.6* 14.7* 16.1* 15.4*  CREATININE 3.42* 3.21* 3.26* 2.94* 2.82*     Estimated Creatinine Clearance: 27.3 mL/min (A) (by C-G formula based on SCr of 2.82 mg/dL (H)).    No Known Allergies   04/16/22, PharmD, BCCCP Clinical Pharmacist  Phone: [phone] 04/15/2022 8:15 AM  Please check AMION for all [facility] Pharmacy phone numbers After 10:00 PM, call Main Pharmacy [phone]

## 2022-04-15 NOTE — Progress Notes (Signed)
0400: received call from lab with critical Hgb of 6.6.  0416: On call MD DR. Hawkins called and notified. MD stated, he will order blood transfusion.  Patinet AO x4. Vitals stable. Plan of care continues.

## 2022-04-15 NOTE — Progress Notes (Signed)
Occupational Therapy Treatment Patient Details Name: Jeffrey Fitzgerald MRN: [id number] DOB: April 19, 1971 Today's Date: 04/15/2022   History of present illness Pt is a 51 yo male admitted 04/01/22 from cath lab after aortogram for LLE ischemia. S/p aortobifemoral bypass graft with bilateral femoral endarterectomy on 8/9. Post op RLE ischemia with return to OR same date for redo R groin exploration with thrombectomy and angioplasty. S/p RLE fem-pop bypass graft with 4 compartment fasciotomy on 8/10. S/p thrombectomy left femoral to popliteal BPG wtih closure and right fasciotomy wtih VAC on right LE on 8/11. Ileus with emesis on 8/14. PMH includes HTN, tobacco smoking, PVD.   OT comments  Pt supine in bed and agreeable to OT/PT session. Focused on standing tolerance, transfers and ADLs.  Completing sit to stand in steady with mod assist +2, progressing to max assist +2 due to pain and weakness.  Standing requires total assist for hygiene due to incontinence of bowels and no awareness.  Pt continues to demonstrate decreased awareness, problem solving and recall during session, requiring cueing for safety.  Fatigues easily and HR up to 158 with activity.  Pt reports wanting a cigarette during session, voicing "I don't care, its worth it to have a cigarette" when educated on tobaccos effects on wound healing.  Continue to recommend AIR at this time. Will follow acutely.    Recommendations for follow up therapy are one component of a multi-disciplinary discharge planning process, led by the attending physician.  Recommendations may be updated based on patient status, additional functional criteria and insurance authorization.    Follow Up Recommendations  Acute inpatient rehab (3hours/day)    Assistance Recommended at Discharge Frequent or constant Supervision/Assistance  Patient can return home with the following  Two people to help with walking and/or transfers;A lot of help with  bathing/dressing/bathroom;Direct supervision/assist for medications management;Assist for transportation;Help with stairs or ramp for entrance   Equipment Recommendations  Wheelchair (measurements OT);Wheelchair cushion (measurements OT);BSC/3in1    Recommendations for Other Services Rehab consult    Precautions / Restrictions Precautions Precautions: Fall;Other (comment) Precaution Comments: wound vac, watch HR, bowel incontinence Restrictions Weight Bearing Restrictions: No       Mobility Bed Mobility Overal bed mobility: Needs Assistance Bed Mobility: Supine to Sit     Supine to sit: Mod assist, HOB elevated     General bed mobility comments: ModA for LE management and trunk elevation sitting on R-side bed; increased time and effort secondary to BLE pain    Transfers Overall transfer level: Needs assistance Equipment used: Ambulation equipment used Transfers: Sit to/from Stand, Bed to chair/wheelchair/BSC Sit to Stand: Max assist, +2 physical assistance, +2 safety/equipment           General transfer comment: mod assist +2 to max assist +2 for sit to stand in stedy over 3 trails.  limited by pain, fatigue, weakness and elevated HR Transfer via Lift Equipment: Stedy   Balance Overall balance assessment: Needs assistance Sitting-balance support: Feet supported, No upper extremity supported Sitting balance-Leahy Scale: Fair Sitting balance - Comments: statically   Standing balance support: Bilateral upper extremity supported, During functional activity Standing balance-Leahy Scale: Zero Standing balance comment: relies on BUE and external support; dependent for posterior pericare; unable to achieve fully upright standing (trunk/hip/knee flexion with pain)                           ADL either performed or assessed with clinical judgement   ADL Overall ADL's :  Needs assistance/impaired                 Upper Body Dressing : Minimal  assistance;Sitting   Lower Body Dressing: Total assistance;+2 for physical assistance;+2 for safety/equipment;Sit to/from stand   Toilet Transfer: Maximal assistance;+2 for safety/equipment;+2 for physical assistance Toilet Transfer Details (indicate cue type and reason): using stedy to recliner Toileting- Clothing Manipulation and Hygiene: Total assistance;+2 for physical assistance;+2 for safety/equipment;Sit to/from stand Toileting - Clothing Manipulation Details (indicate cue type and reason): in standing, incontinent of bowels     Functional mobility during ADLs: Maximal assistance;+2 for physical assistance;+2 for safety/equipment;Rolling walker (2 wheels) General ADL Comments: using stedy    Extremity/Trunk Assessment              Vision       Perception     Praxis      Cognition Arousal/Alertness: Awake/alert Behavior During Therapy: Flat affect Overall Cognitive Status: No family/caregiver present to determine baseline cognitive functioning Area of Impairment: Attention, Memory, Following commands, Safety/judgement, Awareness, Problem solving                   Current Attention Level: Sustained, Selective Memory: Decreased short-term memory Following Commands: Follows one step commands with increased time Safety/Judgement: Decreased awareness of deficits Awareness: Emergent Problem Solving: Slow processing, Requires verbal cues, Decreased initiation, Difficulty sequencing General Comments: continues to present with slow processing and decreased awareness of safety and deficits.        Exercises      Shoulder Instructions       General Comments HR up to 158 with activity. pt continues to state, "I cannot wait to get outside to get a cigarette..." educ pt that he cannot do this while admitted; discussed idea of smoking cessation with pt, which he reports he has no intention of doing - educ pt this will likely mean additional vascular sxs, potential  for poor wound healing leading LE amputation, etc. - to which pt replies, "that's fine, it's worth it to keep smoking"    Pertinent Vitals/ Pain       Pain Assessment Pain Assessment: Faces Faces Pain Scale: Hurts even more Pain Location: BLEs Pain Descriptors / Indicators: Guarding, Grimacing, Discomfort, Moaning Pain Intervention(s): Limited activity within patient's tolerance, Monitored during session, Repositioned  Home Living                                          Prior Functioning/Environment              Frequency  Min 2X/week        Progress Toward Goals  OT Goals(current goals can now be found in the care plan section)  Progress towards OT goals: Progressing toward goals  Acute Rehab OT Goals Patient Stated Goal: get better OT Goal Formulation: With patient Time For Goal Achievement: 04/25/22 Potential to Achieve Goals: Good  Plan Discharge plan remains appropriate;Frequency remains appropriate    Co-evaluation    PT/OT/SLP Co-Evaluation/Treatment: Yes Reason for Co-Treatment: Complexity of the patient's impairments (multi-system involvement) PT goals addressed during session: Mobility/safety with mobility;Balance;Proper use of DME OT goals addressed during session: ADL's and self-care      AM-PAC OT "6 Clicks" Daily Activity     Outcome Measure   Help from another person eating meals?: None Help from another person taking care of personal grooming?: A Little Help from  another person toileting, which includes using toliet, bedpan, or urinal?: Total Help from another person bathing (including washing, rinsing, drying)?: A Lot Help from another person to put on and taking off regular upper body clothing?: A Little Help from another person to put on and taking off regular lower body clothing?: Total 6 Click Score: 14    End of Session Equipment Utilized During Treatment: Gait belt  OT Visit Diagnosis: Unsteadiness on feet  (R26.81);Other abnormalities of gait and mobility (R26.89);Muscle weakness (generalized) (M62.81);Other symptoms and signs involving cognitive function;Pain Pain - Right/Left:  (bil) Pain - part of body: Leg   Activity Tolerance Patient tolerated treatment well   Patient Left in chair;with call bell/phone within reach;with chair alarm set   Nurse Communication Mobility status;Other (comment);Need for lift equipment (use of lift vs stedy, incontience of bowel)        Time: 2202-5427 OT Time Calculation (min): 34 min  Charges: OT General Charges $OT Visit: 1 Visit OT Treatments $Self Care/Home Management : 8-22 mins  Barry Brunner, OT Acute Rehabilitation Services Office [phone]   Chancy Milroy 04/15/2022, 1:30 PM

## 2022-04-15 NOTE — Consult Note (Signed)
WOC Nurse Consult Note: Reason for Consult:assist with Prevena placement; requested by [facility] PA to assist with Y-connection and placement of Prevena incisional NPWT to the right groin wound.  Wound type: surgical wound with staples, right groin  Provided new Y-connector and assisted with placement of Prevena. 1/4 of ostomy barrier ring placed in the right groin to aid in seal of Prevena dressing. Y connected to hospital NPWT device. Patient has traditional NPWT dressing to the RLE fasciotomy site that is also being managed by [facility] staff.   WOC is not following this patient, was requested to assist for supplies and expertise with challenging placement.  Taige Housman [facility], CNS, [employer] [phone]

## 2022-04-15 NOTE — Progress Notes (Signed)
[facility] [place]  Subjective  -no complaints.  Transferred out of ICU.   Objective 131/76 99 (!) 97.3 F (36.3 C) (Oral) 12 98%  Intake/Output Summary (Last 24 hours) at 04/15/2022 0838 Last data filed at 04/15/2022 0653 Gross per 24 hour  Intake 1446.08 ml  Output 2355 ml  Net -908.92 ml     Midline abdominal incision clean dry and intact Bilateral groin incisions intact with some serous drainage from the right groin after Praveena removed Risk PT signals bilaterally Right lateral fasciotomy with VAC Right medial calf incisions all closed Left medial calf incision all closed   Laboratory Lab Results: Recent Labs    04/14/22 0530 04/15/22 0252  WBC 16.1* 15.4*  HGB 7.5* 6.6*  HCT 21.6* 19.2*  PLT 149* 164   BMET Recent Labs    04/14/22 0530 04/15/22 0252  NA 141 142  K 3.2* 3.5  CL 110 110  CO2 23 23  GLUCOSE 113* 110*  BUN 44* 43*  CREATININE 2.94* 2.82*  CALCIUM 7.8* 8.0*    COAG Lab Results  Component Value Date   INR 1.2 04/11/2022   INR 1.5 (H) 04/07/2022   INR 0.9 03/09/2022   No results found for: "PTT"  Assessment/Planning:  51 year old male status post aortobifem with bilateral fem-posterior tibial bypasses for critical limb ischemia with tissue loss.  Moved out of the ICU yesterday to the floor.  Overall looks good this morning.  Has brisk Doppler signals in bilateral PT.  We will give 2 units of pRBCs today for hemoglobin of 6.6.  No surgical bleeding this is just from ongoing oozing from his fasciotomy.  Continue heparin at 500 units an hour for high risk bypasses.  40 mg Lasix IV today given volume overload and AKI is resolving.  We will DC Foley catheter.  We will reapply Prevena VAC to the right groin.  Needs intensive therapy.  OOB and mobilize.  Linezolid and zosyn given purulent drainage from toe prior to surgery.  Cephus Shelling 04/15/2022 8:38 AM --

## 2022-04-15 NOTE — Progress Notes (Addendum)
Physical Therapy Treatment Patient Details Name: Jeffrey Fitzgerald MRN: [id number] DOB: 10-Apr-1971 Today's Date: 04/15/2022   History of Present Illness Pt is a 51 yo male admitted 04/01/22 from cath lab after aortogram for LLE ischemia. S/p aortobifemoral bypass graft with bilateral femoral endarterectomy on 8/9. Post op RLE ischemia with return to OR same date for redo R groin exploration with thrombectomy and angioplasty. S/p RLE fem-pop bypass graft with 4 compartment fasciotomy on 8/10. S/p thrombectomy left femoral to popliteal BPG wtih closure and right fasciotomy wtih VAC on right LE on 8/11. Ileus with emesis on 8/14. PMH includes HTN, tobacco smoking, PVD.   PT Comments    Pt slowly progressing with mobility, motivated to participate. Today's session focused on standing trials, pt requiring mod-maxA+1-2 with stedy frame. Pt has difficulty achieving full trunk/LE extension due to significant pain; pt unaware of bowel incontinence, dependent for pericare. Pt remains limited by generalized weakness, decreased activity tolerance, poor balance strategies/postural reactions and impaired cognition. Pt continues to state how bad he wants a cigarette -- discussed smoking cessation and tobacco's effects on wound healing (and potential for LE amputation due to this), pt reports he does not plan to quit, "I don't care... it's worth it to have a cigarette..." Will continue to follow acutely to address established goals.  HR up to 158 with activity    Recommendations for follow up therapy are one component of a multi-disciplinary discharge planning process, led by the attending physician.  Recommendations may be updated based on patient status, additional functional criteria and insurance authorization.  Follow Up Recommendations  Acute inpatient rehab (3hours/day) Can patient physically be transported by private vehicle: No   Assistance Recommended at Discharge Frequent or constant  Supervision/Assistance  Patient can return home with the following A lot of help with bathing/dressing/bathroom;Assist for transportation;Direct supervision/assist for financial management;Assistance with cooking/housework;Help with stairs or ramp for entrance;Direct supervision/assist for medications management;Two people to help with walking and/or transfers   Equipment Recommendations   (TBD)    Recommendations for Other Services       Precautions / Restrictions Precautions Precautions: Fall;Other (comment) Precaution Comments: wound vac, watch HR, bowel incontinence     Mobility  Bed Mobility Overal bed mobility: Needs Assistance Bed Mobility: Supine to Sit     Supine to sit: Mod assist, HOB elevated     General bed mobility comments: ModA for LE management and trunk elevation sitting on R-side bed; increased time and effort secondary to BLE pain    Transfers Overall transfer level: Needs assistance Equipment used: Ambulation equipment used Transfers: Sit to/from Stand, Bed to chair/wheelchair/BSC Sit to Stand: Max assist, +2 physical assistance, +2 safety/equipment           General transfer comment: 3x sit<>stand from EOB into stedy frame, initial modA+2 on first trial, progressing to maxA+2 on subsequent trials with increasing pain and fatigue; pt unaware of bowel incontinence; prolonged rest break between standing trials secondary to fatigue, pain and tachycardia; maxA+2 standing from stedy frame seat and eccentric lowering to recliner; pt unable to achieve fully upright standing/trunk extension Transfer via Lift Equipment: Stedy  Ambulation/Gait                   Stairs             Wheelchair Mobility    Modified Rankin (Stroke Patients Only)       Balance Overall balance assessment: Needs assistance Sitting-balance support: No upper extremity supported, Feet supported Sitting balance-Leahy  Scale: Fair     Standing balance support:  Bilateral upper extremity supported, During functional activity, Reliant on assistive device for balance Standing balance-Leahy Scale: Zero Standing balance comment: relies on BUE and external support; dependent for posterior pericare; unable to achieve fully upright standing (trunk/hip/knee flexion with pain)                            Cognition Arousal/Alertness: Awake/alert Behavior During Therapy: Flat affect Overall Cognitive Status: No family/caregiver present to determine baseline cognitive functioning Area of Impairment: Attention, Memory, Following commands, Safety/judgement, Awareness, Problem solving                   Current Attention Level: Sustained, Selective Memory: Decreased short-term memory Following Commands: Follows one step commands with increased time Safety/Judgement: Decreased awareness of deficits Awareness: Emergent Problem Solving: Slow processing, Requires verbal cues, Decreased initiation, Difficulty sequencing          Exercises      General Comments General comments (skin integrity, edema, etc.): HR up to 158 with activity. pt continues to state, "I cannot wait to get outside to get a cigarette..." educ pt that he cannot do this while admitted; discussed idea of smoking cessation with pt, which he reports he has no intention of doing - educ pt this will likely mean additional vascular sxs, potential for poor wound healing leading LE amputation, etc. - to which pt replies, "that's fine, it's worth it to keep smoking"      Pertinent Vitals/Pain Pain Assessment Pain Assessment: Faces Faces Pain Scale: Hurts even more Pain Location: BLEs Pain Descriptors / Indicators: Guarding, Grimacing, Discomfort, Moaning Pain Intervention(s): Monitored during session, Limited activity within patient's tolerance, Repositioned    Home Living                          Prior Function            PT Goals (current goals can now be found  in the care plan section) Progress towards PT goals: Progressing toward goals    Frequency    Min 3X/week      PT Plan Current plan remains appropriate    Co-evaluation   Reason for Co-Treatment: Complexity of the patient's impairments (multi-system involvement);For patient/therapist safety;To address functional/ADL transfers PT goals addressed during session: Mobility/safety with mobility;Balance;Proper use of DME        AM-PAC PT "6 Clicks" Mobility   Outcome Measure  Help needed turning from your back to your side while in a flat bed without using bedrails?: A Little Help needed moving from lying on your back to sitting on the side of a flat bed without using bedrails?: A Lot Help needed moving to and from a bed to a chair (including a wheelchair)?: Total Help needed standing up from a chair using your arms (e.g., wheelchair or bedside chair)?: Total Help needed to walk in hospital room?: Total Help needed climbing 3-5 steps with a railing? : Total 6 Click Score: 9    End of Session Equipment Utilized During Treatment: Gait belt Activity Tolerance: Patient limited by pain Patient left: in chair;with call bell/phone within reach;with chair alarm set Nurse Communication: Mobility status;Need for lift equipment;Other (comment) (tachycardia) PT Visit Diagnosis: Other abnormalities of gait and mobility (R26.89);Difficulty in walking, not elsewhere classified (R26.2);Muscle weakness (generalized) (M62.81)     Time: 8768-1157 PT Time Calculation (min) (ACUTE ONLY): 25 min  Charges:  $Therapeutic Activity: 8-22 mins                     Ina Homes, PT, DPT Acute Rehabilitation Services  Personal: Secure Chat Rehab Office: [phone]  Malachy Chamber 04/15/2022, 12:40 PM

## 2022-04-15 NOTE — Progress Notes (Signed)
IP rehab admissions - Call placed to patient's sister today and a message was left on her voice mail.  No call back today.  Will have my partner follow up tomorrow.  Call for questions.  [phone]

## 2022-04-15 NOTE — Progress Notes (Signed)
Paged Dr. Juanetta Gosling back to verify about heparin drip, ok to continue at 52ml/hr and ok to give ASA this am. Blood transfusing. Plan of care continues.

## 2022-04-15 NOTE — Progress Notes (Addendum)
ANTICOAGULATION NOTE   Pharmacy Consult for heparin Indication:  PVD  No Known Allergies  Patient Measurements: Height: 5\' 5"  (165.1 cm) Weight: 68.8 kg (151 lb 10.8 oz) IBW/kg (Calculated) : 61.5 Heparin Dosing Weight: 66kg  Vital Signs: Temp: 97.3 F (36.3 C) (08/17 0513) Temp Source: Oral (08/17 0513) BP: 131/76 (08/17 0513) Pulse Rate: 99 (08/17 0513)  Labs: Recent Labs    04/13/22 0440 04/14/22 0530 04/15/22 0252  HGB 8.5* 7.5* 6.6*  HCT 24.3* 21.6* 19.2*  PLT 131* 149* 164  HEPARINUNFRC <0.10* <0.10* <0.10*  CREATININE 3.26* 2.94* 2.82*     Estimated Creatinine Clearance: 27.3 mL/min (A) (by C-G formula based on SCr of 2.82 mg/dL (H)).   Medical History: Past Medical History:  Diagnosis Date   Hypertension    Peripheral vascular disease (HCC)    Tobacco abuse     Medications:  Medications Prior to Admission  Medication Sig Dispense Refill Last Dose   acetaminophen (TYLENOL) 500 MG tablet Take 500 mg by mouth every 6 (six) hours as needed for moderate pain.   03/31/2022   ibuprofen (ADVIL) 200 MG tablet Take 200 mg by mouth every 6 (six) hours as needed for moderate pain.   03/31/2022   Scheduled:   sodium chloride   Intravenous Once   sodium chloride   Intravenous Once   acetaminophen  1,000 mg Oral Once   aspirin EC  81 mg Oral Q0600   atorvastatin  80 mg Oral Daily   Chlorhexidine Gluconate Cloth  6 each Topical Daily   docusate sodium  100 mg Oral Daily   furosemide  40 mg Intravenous Daily   nicotine  21 mg Transdermal Daily   pantoprazole  40 mg Oral Daily   sodium chloride flush  3 mL Intravenous Q12H   Infusions:   sodium chloride 10 mL/hr at 04/14/22 1500   sodium chloride     heparin 500 Units/hr (04/14/22 1500)   linezolid (ZYVOX) IV 600 mg (04/14/22 2319)   piperacillin-tazobactam (ZOSYN)  IV 3.375 g (04/15/22 0217)    Assessment: Pt with PVD who is s/p bypass. Heparin was ordered by [facility] at 500 units/hr without titration.    Heparin levels have remained undetectable as expected. Hgb down to 6.6 - per notes plan for PRBCs - okay to continue heparin drip at this time.  Platelet count 164. Discussed with [facility] 8/14, planning to keep heparin at fixed, low rate.  Goal of Therapy:  Monitor platelets by anticoagulation protocol: Yes   Plan:  Heparin 500 units/hr per [facility] Daily heparin and CBC Monitor for s/sx of bleeding  9/14, PharmD, BCCCP Clinical Pharmacist  Phone: [phone] 04/15/2022 7:31 AM  Please check AMION for all [facility] Pharmacy phone numbers After 10:00 PM, call Main Pharmacy [phone]

## 2022-04-16 LAB — BPAM RBC
Blood Product Expiration Date: 202308242359
Blood Product Expiration Date: 202308272359
Blood Product Expiration Date: 202309062359
Blood Product Expiration Date: 202309062359
ISSUE DATE / TIME: 202308140646
ISSUE DATE / TIME: 202308140924
ISSUE DATE / TIME: 202308170449
ISSUE DATE / TIME: 202308171314
Unit Type and Rh: 6200
Unit Type and Rh: 6200
Unit Type and Rh: 6200
Unit Type and Rh: 6200

## 2022-04-16 LAB — TYPE AND SCREEN
ABO/RH(D): A POS
Antibody Screen: NEGATIVE
Unit division: 0
Unit division: 0
Unit division: 0
Unit division: 0

## 2022-04-16 LAB — BASIC METABOLIC PANEL
Anion gap: 9 (ref 5–15)
BUN: 34 mg/dL — ABNORMAL HIGH (ref 6–20)
CO2: 24 mmol/L (ref 22–32)
Calcium: 8 mg/dL — ABNORMAL LOW (ref 8.9–10.3)
Chloride: 106 mmol/L (ref 98–111)
Creatinine, Ser: 2.49 mg/dL — ABNORMAL HIGH (ref 0.61–1.24)
GFR, Estimated: 31 mL/min — ABNORMAL LOW (ref >60)
Glucose, Bld: 106 mg/dL — ABNORMAL HIGH (ref 70–99)
Potassium: 3.6 mmol/L (ref 3.5–5.1)
Sodium: 139 mmol/L (ref 135–145)

## 2022-04-16 LAB — CBC
HCT: 24.8 % — ABNORMAL LOW (ref 39.0–52.0)
Hemoglobin: 8.4 g/dL — ABNORMAL LOW (ref 13.0–17.0)
MCH: 29.7 pg (ref 26.0–34.0)
MCHC: 33.9 g/dL (ref 30.0–36.0)
MCV: 87.6 fL (ref 80.0–100.0)
Platelets: 151 10*3/uL (ref 150–400)
RBC: 2.83 MIL/uL — ABNORMAL LOW (ref 4.22–5.81)
RDW: 15.7 % — ABNORMAL HIGH (ref 11.5–15.5)
WBC: 15.7 10*3/uL — ABNORMAL HIGH (ref 4.0–10.5)
nRBC: 0.8 % — ABNORMAL HIGH (ref 0.0–0.2)

## 2022-04-16 LAB — HEPARIN LEVEL (UNFRACTIONATED): Heparin Unfractionated: <0.1 IU/mL — ABNORMAL LOW (ref 0.30–0.70)

## 2022-04-16 MED ORDER — HYDRALAZINE HCL 10 MG PO TABS
10.0000 mg | ORAL_TABLET | Freq: Two times a day (BID) | ORAL | Status: DC
  Administered 2022-04-16 – 2022-05-03 (×32): 10 mg via ORAL
  Filled 2022-04-16 (×34): qty 1

## 2022-04-16 MED ORDER — POTASSIUM CHLORIDE CRYS ER 20 MEQ PO TBCR
20.0000 meq | EXTENDED_RELEASE_TABLET | Freq: Once | ORAL | Status: AC
  Administered 2022-04-16: 20 meq via ORAL
  Filled 2022-04-16: qty 1

## 2022-04-16 NOTE — Progress Notes (Addendum)
[facility]  Subjective  - Slowly improving, no acute distress.   Objective (!) 161/77 (!) 107 98.1 F (36.7 C) (Oral) 17 100%  Intake/Output Summary (Last 24 hours) at 04/16/2022 0732 Last data filed at 04/16/2022 0600 Gross per 24 hour  Intake 878.98 ml  Output 650 ml  Net 228.98 ml    Doppler signals B LE DP/PT Right foot with edema compared to the left, motor left, no toe or ankle motor right LE Right groin praveena intact, right lateral fasciotomy vac in place  Lungs non labored breathing General no acute distress   Assessment/Planning: Aortoiliac occlusive disease Aortobifemoral, Left common femoral to posterior tibial bypass with composite PTFE and great saphenous vein.  Followed by thrombectomy, then right LE bypass with vein.  He then returned to the OR for   Thrombectomy of left common femoral to posterior tibial artery bypass, medial right fasciotomy closure and vac placement on lateral fasciotomy site.    Fasciotomy vac change pending I will try and coordinate this with DR. Indra Wolters.   WBC leukocytosis 15.7, afebrile B LE have doppler signals PT, no motor right ankle/toes Heparin 500 units/hr Urine OP 350 last 24 hours, Cr 2.49  Not currently on antibiotics  Jeffrey Fitzgerald 04/16/2022 7:32 AM --  Laboratory Lab Results: Recent Labs    04/15/22 0252 04/16/22 0323  WBC 15.4* 15.7*  HGB 6.6* 8.4*  HCT 19.2* 24.8*  PLT 164 151   BMET Recent Labs    04/15/22 0252 04/16/22 0323  NA 142 139  K 3.5 3.6  CL 110 106  CO2 23 24  GLUCOSE 110* 106*  BUN 43* 34*  CREATININE 2.82* 2.49*  CALCIUM 8.0* 8.0*    COAG Lab Results  Component Value Date   INR 1.2 04/11/2022   INR 1.5 (H) 04/07/2022   INR 0.9 03/09/2022   No results found for: "PTT"   I have seen and evaluated the patient. I agree with the PA note as documented above.  Status post aortobifemoral bypass with bilateral fem-tib bypasses.  Brisk PT  signals bilaterally.  Hemoglobin with appropriate response from 6.6 to 8.4 after 2 units packed red cells yesterday.  No surgical bleeding this was oozing from his vac.  Right lateral calf fasciotomy VAC changed today.  Continue heparin at 500 units an hour for high risk lower extremity bypasses.  Incisional VAC placed back in the right groin.  He can get out of bed and mobilize.  Hoping for CIR.  On linezolid and vanc for purulent drainage from toe - antibiotics ended yesterday.  AKI improving.  Diuresis with 40 mg IV lasix daily.    Jeffrey Shelling, MD [facility] Office: [phone]

## 2022-04-16 NOTE — Progress Notes (Signed)
ANTICOAGULATION NOTE   Pharmacy Consult for heparin Indication:  PVD  No Known Allergies  Patient Measurements: Height: 5\' 5"  (165.1 cm) Weight: 68.8 kg (151 lb 10.8 oz) IBW/kg (Calculated) : 61.5 Heparin Dosing Weight: 66kg  Vital Signs: Temp: 97.9 F (36.6 C) (08/18 0747) Temp Source: Oral (08/18 0747) BP: 155/93 (08/18 0747) Pulse Rate: 100 (08/18 0747)  Labs: Recent Labs    04/14/22 0530 04/15/22 0252 04/16/22 0323  HGB 7.5* 6.6* 8.4*  HCT 21.6* 19.2* 24.8*  PLT 149* 164 151  HEPARINUNFRC <0.10* <0.10* <0.10*  CREATININE 2.94* 2.82* 2.49*     Estimated Creatinine Clearance: 30.9 mL/min (A) (by C-G formula based on SCr of 2.49 mg/dL (H)).   Medical History: Past Medical History:  Diagnosis Date   Hypertension    Peripheral vascular disease (HCC)    Tobacco abuse     Medications:  Medications Prior to Admission  Medication Sig Dispense Refill Last Dose   acetaminophen (TYLENOL) 500 MG tablet Take 500 mg by mouth every 6 (six) hours as needed for moderate pain.   03/31/2022   ibuprofen (ADVIL) 200 MG tablet Take 200 mg by mouth every 6 (six) hours as needed for moderate pain.   03/31/2022   Scheduled:   sodium chloride   Intravenous Once   sodium chloride   Intravenous Once   sodium chloride   Intravenous Once   acetaminophen  1,000 mg Oral Once   aspirin EC  81 mg Oral Q0600   atorvastatin  80 mg Oral Daily   Chlorhexidine Gluconate Cloth  6 each Topical Daily   docusate sodium  100 mg Oral Daily   furosemide  40 mg Intravenous Daily   hydrALAZINE  10 mg Oral BID   nicotine  21 mg Transdermal Daily   pantoprazole  40 mg Oral Daily   sodium chloride flush  3 mL Intravenous Q12H   Infusions:   sodium chloride 10 mL/hr at 04/14/22 1500   sodium chloride     heparin 500 Units/hr (04/15/22 1822)    Assessment: Pt with PVD who is s/p bypass. Heparin was ordered by VVS at 500 units/hr without titration.   Heparin levels have remained undetectable as  expected. Hgb trended up to 8.4 after PRBCs 8/17.  Platelet count 151. Discussed with VVS 8/14, planning to keep heparin at fixed, low rate.  Goal of Therapy:  Monitor platelets by anticoagulation protocol: Yes   Plan:  Heparin 500 units/hr per VVS Daily heparin and CBC Monitor for s/sx of bleeding  9/14, PharmD, BCCCP Clinical Pharmacist  Phone: [phone] 04/16/2022 9:01 AM  Please check AMION for all [facility] phone numbers After 10:00 PM, call Main Pharmacy [phone]

## 2022-04-16 NOTE — Progress Notes (Signed)
Progress Note  Patient Name: Jeffrey Fitzgerald Date of Encounter: 04/16/2022  Primary Cardiologist:   Rollene Rotunda, MD   Subjective   Ambulated in room .  Denies chest pain or SOB.   Inpatient Medications    Scheduled Meds:  sodium chloride   Intravenous Once   sodium chloride   Intravenous Once   sodium chloride   Intravenous Once   acetaminophen  1,000 mg Oral Once   aspirin EC  81 mg Oral Q0600   atorvastatin  80 mg Oral Daily   Chlorhexidine Gluconate Cloth  6 each Topical Daily   docusate sodium  100 mg Oral Daily   furosemide  40 mg Intravenous Daily   nicotine  21 mg Transdermal Daily   pantoprazole  40 mg Oral Daily   sodium chloride flush  3 mL Intravenous Q12H   Continuous Infusions:  sodium chloride 10 mL/hr at 04/14/22 1500   sodium chloride     heparin 500 Units/hr (04/15/22 1822)   PRN Meds: sodium chloride, sodium chloride, acetaminophen **OR** acetaminophen, bisacodyl, Gerhardt's butt cream, hydrALAZINE, labetalol, morphine injection, ondansetron, mouth rinse, oxyCODONE-acetaminophen, phenol, polyethylene glycol, sodium chloride flush   Vital Signs    Vitals:   04/15/22 2014 04/16/22 0024 04/16/22 0448 04/16/22 0747  BP: (!) 156/83 (!) 160/72 (!) 161/77 (!) 155/93  Pulse: (!) 108 (!) 104 (!) 107 100  Resp: 19 16 17 14   Temp: 98.5 F (36.9 C) 97.7 F (36.5 C) 98.1 F (36.7 C) 97.9 F (36.6 C)  TempSrc: Oral Oral Oral Oral  SpO2: 96% 100% 100% 99%  Weight:      Height:        Intake/Output Summary (Last 24 hours) at 04/16/2022 0831 Last data filed at 04/16/2022 0600 Gross per 24 hour  Intake 454.98 ml  Output 650 ml  Net -195.02 ml   Filed Weights   04/08/22 1121 04/09/22 0600 04/09/22 0830  Weight: 65.8 kg 68.8 kg 68.8 kg    Telemetry    NSR, ST - Personally Reviewed  ECG    NA - Personally Reviewed  Physical Exam   GEN: No  acute distress.   Neck: No  JVD Cardiac: RRR, no murmurs, rubs, or gallops.  Respiratory: Clear    to auscultation bilaterally. GI: Soft, nontender, non-distended, normal bowel sounds  MS:   Mild legedema; No deformity. Neuro:   Nonfocal  Psych: Oriented and appropriate    Labs    Chemistry Recent Labs  Lab 04/11/22 0437 04/11/22 1906 04/14/22 0530 04/15/22 0252 04/16/22 0323  NA 140   < > 141 142 139  K 3.6   < > 3.2* 3.5 3.6  CL 113*   < > 110 110 106  CO2 19*   < > 23 23 24   GLUCOSE 112*   < > 113* 110* 106*  BUN 52*   < > 44* 43* 34*  CREATININE 3.52*   < > 2.94* 2.82* 2.49*  CALCIUM 7.4*   < > 7.8* 8.0* 8.0*  PROT 4.6*  --   --   --   --   ALBUMIN 2.0*  --   --   --   --   AST 298*  --   --   --   --   ALT 69*  --   --   --   --   ALKPHOS 39  --   --   --   --   BILITOT 0.8  --   --   --   --  GFRNONAA 20*   < > 25* 26* 31*  ANIONGAP 8   < > 8 9 9    < > = values in this interval not displayed.     Hematology Recent Labs  Lab 04/14/22 0530 04/15/22 0252 04/16/22 0323  WBC 16.1* 15.4* 15.7*  RBC 2.45* 2.11* 2.83*  HGB 7.5* 6.6* 8.4*  HCT 21.6* 19.2* 24.8*  MCV 88.2 91.0 87.6  MCH 30.6 31.3 29.7  MCHC 34.7 34.4 33.9  RDW 16.4* 16.2* 15.7*  PLT 149* 164 151    Cardiac EnzymesNo results for input(s): "TROPONINI" in the last 168 hours. No results for input(s): "TROPIPOC" in the last 168 hours.   BNPNo results for input(s): "BNP", "PROBNP" in the last 168 hours.   DDimer No results for input(s): "DDIMER" in the last 168 hours.   Radiology    No results found.  Cardiac Studies   TTE 04/02/2022   1. Left ventricular ejection fraction, by estimation, is 60 to 65%. The  left ventricle has normal function. The left ventricle demonstrates  regional wall motion abnormalities with basal inferior hypokinesis. There  is mild left ventricular hypertrophy.  Left ventricular diastolic parameters are consistent with Grade I  diastolic dysfunction (impaired relaxation).   2. Right ventricular systolic function is normal. The right ventricular  size is  normal. Tricuspid regurgitation signal is inadequate for assessing  PA pressure.   3. The aortic valve is tricuspid. Aortic valve regurgitation is not  visualized. No aortic stenosis is present.   4. The mitral valve is normal in structure. No evidence of mitral valve  regurgitation. No evidence of mitral stenosis.   5. The inferior vena cava is normal in size with greater than 50%  respiratory variability, suggesting right atrial pressure of 3 mmHg.     Patient Profile     51 y.o. male  with hypertension, tobacco abuse, PAD who was admitted on 04/01/2022 with critical limb ischemia.  He is status post aortobifem bypass with bifemoral endarterectomy with left femoral to PT bypass on 04/07/2022.  Course complicated by acute thrombus of the right common femoral artery on 04/07/2022 status post thrombectomy.  Taken back to the OR on 04/08/2022 for right femoral to PT bypass with right lower extremity fasciotomy.  Status post left lower extremity fasciotomy and thrombectomy of the left femoral PT bypass on 04/09/2022.  Cardiology was consulted for hypertension management.  Course is now complicated by acute kidney injury as well as anemia.    Assessment & Plan    Acute critical limb ischemia:    Management as listed elsewhere.  Continue statin for PVD.   AKI:    Creat is slowly coming down.   Follow with primary provider.   HTN:   BPs I would suggest starting hydralazine for increased BPs.  Could be BID for now.   Please call with further questions.   For questions or updates, please contact [facility] Please consult [email or web address] for contact info under Cardiology/STEMI.   Signed, 06/09/2022, MD  04/16/2022, 8:31 AM

## 2022-04-16 NOTE — Progress Notes (Signed)
Pt had incontinent episode of bowel, and bladder. Gown and bed pad was wet. Bladder scan after was 254 cc. Pt denies any discomfort or urge to urinate. Primofit applied. Plan of care continues.

## 2022-04-16 NOTE — Progress Notes (Signed)
Patient was unable to urinate after foley came out at 1630 . Patient kept trying several times, but was not successful. Bladder scan showed 405 cc. Pt had urge to urinate but unable to. When went in the room for I/o cath, pt had urinated 200 cc. Post void bladder scan was 271 cc. Patient was still trying to urinate.will follow urinary cath removal protocol as needed.

## 2022-04-16 NOTE — Progress Notes (Signed)
Inpatient Rehab Admissions Coordinator:    I spoke with Pt.'s sister [name] who states that she can be with Pt. 5 days a week, but she works outside the home 2 days per week. Pt. Is likely to need 24/7 care upon d/c, so she is asking other family members if they can stay with pt. While she works. I will follow for potential admit pending medical readiness and confirmation of support.    Megan Salon, MS, CCC-SLP Rehab Admissions Coordinator  [phone] (celll) [phone] (office)

## 2022-04-17 LAB — CBC
HCT: 22.6 % — ABNORMAL LOW (ref 39.0–52.0)
Hemoglobin: 7.8 g/dL — ABNORMAL LOW (ref 13.0–17.0)
MCH: 30.5 pg (ref 26.0–34.0)
MCHC: 34.5 g/dL (ref 30.0–36.0)
MCV: 88.3 fL (ref 80.0–100.0)
Platelets: 170 10*3/uL (ref 150–400)
RBC: 2.56 MIL/uL — ABNORMAL LOW (ref 4.22–5.81)
RDW: 15.4 % (ref 11.5–15.5)
WBC: 17.8 10*3/uL — ABNORMAL HIGH (ref 4.0–10.5)
nRBC: 0.8 % — ABNORMAL HIGH (ref 0.0–0.2)

## 2022-04-17 LAB — BASIC METABOLIC PANEL
Anion gap: 8 (ref 5–15)
BUN: 30 mg/dL — ABNORMAL HIGH (ref 6–20)
CO2: 24 mmol/L (ref 22–32)
Calcium: 8.2 mg/dL — ABNORMAL LOW (ref 8.9–10.3)
Chloride: 108 mmol/L (ref 98–111)
Creatinine, Ser: 2.14 mg/dL — ABNORMAL HIGH (ref 0.61–1.24)
GFR, Estimated: 37 mL/min — ABNORMAL LOW (ref >60)
Glucose, Bld: 113 mg/dL — ABNORMAL HIGH (ref 70–99)
Potassium: 3.5 mmol/L (ref 3.5–5.1)
Sodium: 140 mmol/L (ref 135–145)

## 2022-04-17 LAB — GLUCOSE, CAPILLARY: Glucose-Capillary: 129 mg/dL — ABNORMAL HIGH (ref 70–99)

## 2022-04-17 LAB — HEPARIN LEVEL (UNFRACTIONATED): Heparin Unfractionated: <0.1 IU/mL — ABNORMAL LOW (ref 0.30–0.70)

## 2022-04-17 MED ORDER — ASPIRIN 81 MG PO TBEC
81.0000 mg | DELAYED_RELEASE_TABLET | Freq: Every day | ORAL | Status: DC
  Administered 2022-04-17 – 2022-05-19 (×32): 81 mg via ORAL
  Filled 2022-04-17 (×32): qty 1

## 2022-04-17 NOTE — Progress Notes (Signed)
ANTICOAGULATION NOTE   Pharmacy Consult for heparin Indication:  PVD  No Known Allergies  Patient Measurements: Height: 5\' 5"  (165.1 cm) Weight: 68.8 kg (151 lb 10.8 oz) IBW/kg (Calculated) : 61.5 Heparin Dosing Weight: 66kg  Vital Signs: Temp: 97.9 F (36.6 C) (08/19 0419) Temp Source: Oral (08/19 0419) BP: 162/77 (08/19 0419) Pulse Rate: 105 (08/19 0419)  Labs: Recent Labs    04/15/22 0252 04/16/22 0323 04/17/22 0150  HGB 6.6* 8.4* 7.8*  HCT 19.2* 24.8* 22.6*  PLT 164 151 170  HEPARINUNFRC <0.10* <0.10* <0.10*  CREATININE 2.82* 2.49* 2.14*    Estimated Creatinine Clearance: 35.9 mL/min (A) (by C-G formula based on SCr of 2.14 mg/dL (H)).   Medical History: Past Medical History:  Diagnosis Date   Hypertension    Peripheral vascular disease (HCC)    Tobacco abuse     Medications:  Medications Prior to Admission  Medication Sig Dispense Refill Last Dose   acetaminophen (TYLENOL) 500 MG tablet Take 500 mg by mouth every 6 (six) hours as needed for moderate pain.   03/31/2022   ibuprofen (ADVIL) 200 MG tablet Take 200 mg by mouth every 6 (six) hours as needed for moderate pain.   03/31/2022   Scheduled:   sodium chloride   Intravenous Once   sodium chloride   Intravenous Once   sodium chloride   Intravenous Once   acetaminophen  1,000 mg Oral Once   aspirin EC  81 mg Oral Daily   atorvastatin  80 mg Oral Daily   Chlorhexidine Gluconate Cloth  6 each Topical Daily   docusate sodium  100 mg Oral Daily   furosemide  40 mg Intravenous Daily   hydrALAZINE  10 mg Oral BID   nicotine  21 mg Transdermal Daily   pantoprazole  40 mg Oral Daily   sodium chloride flush  3 mL Intravenous Q12H   Infusions:   sodium chloride 10 mL/hr at 04/14/22 1500   sodium chloride     heparin 500 Units/hr (04/17/22 0008)    Assessment: Pt with PVD who is s/p bypass. Heparin was ordered by VVS at 500 units/hr without titration.   Heparin level remains undetectable. Hgb trended  up to 8.4 after PRBCs 8/17, trending down again at 7.8 on 8/19.  Platelet count slightly increased at 170K. No bleeding noted.   Discussed with VVS 8/14, planning to keep heparin at fixed, low rate.   Goal of Therapy:  Monitor platelets by anticoagulation protocol: Yes   Plan:  Heparin 500 units/hr per VVS Daily heparin and CBC Monitor for s/sx of bleeding  9/14, PharmD PGY1 Pharmacy Resident   04/17/2022  7:37 AM   Please check AMION for all [facility] Pharmacy phone numbers After 10:00 PM, call Main Pharmacy [phone]

## 2022-04-17 NOTE — Progress Notes (Signed)
Wound vac alarming blocked. Upon investigation, only line from calf is blocked. Unsuccessfully attempted to aspirate clot, called Dr. Lenell Antu to notify. Per MD, remove calf wound vac and cover with wet to dry dressing.

## 2022-04-17 NOTE — Progress Notes (Addendum)
[facility] [place]  Subjective  - Doing OK over all, no new complaints   Objective (!) 162/77 (!) 105 97.9 F (36.6 C) (Oral) 18 100%  Intake/Output Summary (Last 24 hours) at 04/17/2022 0653 Last data filed at 04/17/2022 0426 Gross per 24 hour  Intake 90.66 ml  Output 2050 ml  Net -1959.34 ml   Right groin incisional vac to suction, right lateral fasciotomy wound vac to suction Right foot with edema compared to the left, motor left, no toe or ankle motor right LE Left LE with intact motor and PT signals B LE Lungs non labored breathing Abdomin soft General no acute distress       Assessment/Planning: Aortoiliac occlusive disease Aortobifemoral, Left common femoral to posterior tibial bypass with composite PTFE and great saphenous vein.  Followed by thrombectomy, then right LE bypass with vein.  He then returned to the OR for   Thrombectomy of left common femoral to posterior tibial artery bypass, medial right fasciotomy closure and vac placement on lateral fasciotomy site.    Doppler signals PT B patent inflow HGB 7.8, asymptomatic supine, he is not an Ambulator Vac out put 150 cc last 24 hours continues to decrease over time.   Leukocytosis from 15 now 17.  Afebrile.  Lungs non labored breathing Plans to change right LE vac Monday Not currently on antibiotics Heparin cont. T 500/hr  Repeat labs in am   Mosetta Pigeon 04/17/2022 6:53 AM --  Laboratory Lab Results: Recent Labs    04/16/22 0323 04/17/22 0150  WBC 15.7* 17.8*  HGB 8.4* 7.8*  HCT 24.8* 22.6*  PLT 151 170   BMET Recent Labs    04/16/22 0323 04/17/22 0150  NA 139 140  K 3.6 3.5  CL 106 108  CO2 24 24  GLUCOSE 106* 113*  BUN 34* 30*  CREATININE 2.49* 2.14*  CALCIUM 8.0* 8.2*    COAG Lab Results  Component Value Date   INR 1.2 04/11/2022   INR 1.5 (H) 04/07/2022   INR 0.9 03/09/2022   No results found for: "PTT"  VASCULAR STAFF ADDENDUM: I  have independently interviewed and examined the patient. I agree with the above.   Rande Brunt. Lenell Antu, MD [facility] Phone Number: [phone] 04/17/2022 4:22 PM

## 2022-04-18 ENCOUNTER — Inpatient Hospital Stay (HOSPITAL_COMMUNITY): Payer: Medicaid Other

## 2022-04-18 LAB — BASIC METABOLIC PANEL
Anion gap: 8 (ref 5–15)
BUN: 27 mg/dL — ABNORMAL HIGH (ref 6–20)
CO2: 26 mmol/L (ref 22–32)
Calcium: 7.8 mg/dL — ABNORMAL LOW (ref 8.9–10.3)
Chloride: 103 mmol/L (ref 98–111)
Creatinine, Ser: 1.86 mg/dL — ABNORMAL HIGH (ref 0.61–1.24)
GFR, Estimated: 44 mL/min — ABNORMAL LOW (ref >60)
Glucose, Bld: 116 mg/dL — ABNORMAL HIGH (ref 70–99)
Potassium: 3.3 mmol/L — ABNORMAL LOW (ref 3.5–5.1)
Sodium: 137 mmol/L (ref 135–145)

## 2022-04-18 LAB — GLUCOSE, CAPILLARY
Glucose-Capillary: 114 mg/dL — ABNORMAL HIGH (ref 70–99)
Glucose-Capillary: 163 mg/dL — ABNORMAL HIGH (ref 70–99)
Glucose-Capillary: 128 mg/dL — ABNORMAL HIGH (ref 70–99)

## 2022-04-18 LAB — CBC
HCT: 18.3 % — ABNORMAL LOW (ref 39.0–52.0)
Hemoglobin: 6.3 g/dL — CL (ref 13.0–17.0)
MCH: 30.4 pg (ref 26.0–34.0)
MCHC: 34.4 g/dL (ref 30.0–36.0)
MCV: 88.4 fL (ref 80.0–100.0)
Platelets: 187 10*3/uL (ref 150–400)
RBC: 2.07 MIL/uL — ABNORMAL LOW (ref 4.22–5.81)
RDW: 15 % (ref 11.5–15.5)
WBC: 20.6 10*3/uL — ABNORMAL HIGH (ref 4.0–10.5)
nRBC: 1.1 % — ABNORMAL HIGH (ref 0.0–0.2)

## 2022-04-18 LAB — PREPARE RBC (CROSSMATCH)

## 2022-04-18 LAB — HEPARIN LEVEL (UNFRACTIONATED): Heparin Unfractionated: <0.1 IU/mL — ABNORMAL LOW (ref 0.30–0.70)

## 2022-04-18 MED ORDER — POTASSIUM CHLORIDE ER 10 MEQ PO TBCR
40.0000 meq | EXTENDED_RELEASE_TABLET | Freq: Once | ORAL | Status: AC
  Administered 2022-04-18: 40 meq via ORAL
  Filled 2022-04-18 (×2): qty 4

## 2022-04-18 MED ORDER — PIPERACILLIN-TAZOBACTAM 3.375 G IVPB
3.3750 g | Freq: Three times a day (TID) | INTRAVENOUS | Status: DC
  Administered 2022-04-18 – 2022-04-27 (×24): 3.375 g via INTRAVENOUS
  Filled 2022-04-18 (×22): qty 50

## 2022-04-18 NOTE — Progress Notes (Signed)
ANTICOAGULATION NOTE   Pharmacy Consult for heparin Indication:  PVD  No Known Allergies  Patient Measurements: Height: 5\' 5"  (165.1 cm) Weight: 68.8 kg (151 lb 10.8 oz) IBW/kg (Calculated) : 61.5 Heparin Dosing Weight: 66kg  Vital Signs: Temp: 97.9 F (36.6 C) (08/20 0606) Temp Source: Oral (08/20 0606) BP: 155/63 (08/20 0606) Pulse Rate: 69 (08/20 0606)  Labs: Recent Labs    04/16/22 0323 04/17/22 0150 04/18/22 0325  HGB 8.4* 7.8* 6.3*  HCT 24.8* 22.6* 18.3*  PLT 151 170 187  HEPARINUNFRC <0.10* <0.10* <0.10*  CREATININE 2.49* 2.14* 1.86*     Estimated Creatinine Clearance: 41.3 mL/min (A) (by C-G formula based on SCr of 1.86 mg/dL (H)).   Medical History: Past Medical History:  Diagnosis Date   Hypertension    Peripheral vascular disease (HCC)    Tobacco abuse     Medications:  Medications Prior to Admission  Medication Sig Dispense Refill Last Dose   acetaminophen (TYLENOL) 500 MG tablet Take 500 mg by mouth every 6 (six) hours as needed for moderate pain.   03/31/2022   ibuprofen (ADVIL) 200 MG tablet Take 200 mg by mouth every 6 (six) hours as needed for moderate pain.   03/31/2022   Scheduled:   sodium chloride   Intravenous Once   sodium chloride   Intravenous Once   sodium chloride   Intravenous Once   acetaminophen  1,000 mg Oral Once   aspirin EC  81 mg Oral Daily   atorvastatin  80 mg Oral Daily   Chlorhexidine Gluconate Cloth  6 each Topical Daily   docusate sodium  100 mg Oral Daily   furosemide  40 mg Intravenous Daily   hydrALAZINE  10 mg Oral BID   nicotine  21 mg Transdermal Daily   pantoprazole  40 mg Oral Daily   sodium chloride flush  3 mL Intravenous Q12H   Infusions:   sodium chloride 10 mL/hr at 04/14/22 1500   sodium chloride     heparin 500 Units/hr (04/18/22 0010)    Assessment: Pt with PVD who is s/p bypass. Heparin was ordered by VVS at 500 units/hr without titration.   Heparin level remains undetectable today. Hgb  trended up to 8.4 after PRBCs 8/17, trending down again at 7.8 on 8/19, now Hgb 6.3 this morning, 2U PRBCs ordered. Platelet count stable at 187K. No issues with infusion noted. With RN present in room, noted that calf wound vac was removed due to blockage, serosanguineous fluid present in other wound vac.   Discussed with VVS 8/14, planning to keep heparin at fixed, low rate.   Goal of Therapy:  Monitor platelets by anticoagulation protocol: Yes   Plan:  Heparin 500 units/hr per VVS Daily heparin and CBC Monitor for s/sx of bleeding  9/14, PharmD PGY1 Pharmacy Resident   04/18/2022  7:08 AM   Please check AMION for all [facility] phone numbers After 10:00 PM, call Main Pharmacy [phone]

## 2022-04-18 NOTE — Progress Notes (Addendum)
[facility] [place]  Subjective  - no acute distress   Objective (!) 155/63 69 97.9 F (36.6 C) (Oral) 16 100%  Intake/Output Summary (Last 24 hours) at 04/18/2022 9381 Last data filed at 04/17/2022 2003 Gross per 24 hour  Intake --  Output 500 ml  Net -500 ml   Right groin vac pulled off by patient due to pain   Dry dressing placed over right groin incision  Lungs non labored breathing Right LE wet to dry dressing maintained Right foot with edema compared to the left, motor left, no toe or ankle motor right LE Left LE with intact motor and PT signals B LE   Assessment/Planning: Aortoiliac occlusive disease Aortobifemoral, Left common femoral to posterior tibial bypass with composite PTFE and great saphenous vein.  Followed by thrombectomy, then right LE bypass with vein.  He then returned to the OR for   Thrombectomy of left common femoral to posterior tibial artery bypass, medial right fasciotomy closure and vac placement on lateral fasciotomy site.     Doppler signals PT B patent inflow HGB 6.3 2 units of PRBC have been ordered for transfusion. Vac to lateral left LE with lost seal, now wet to dry dressing.  Right groin dry dressing placed over the incision Leukocytosis increasing WBC now 20.6.  Afebrile.  Not currently on antibiotics Heparin cont. T 500/hr Blood cultures, chest x ray and UA  pending will start Zosyn later today per pharmacy dosing     Mosetta Pigeon 04/18/2022 7:14 AM --  Laboratory Lab Results: Recent Labs    04/17/22 0150 04/18/22 0325  WBC 17.8* 20.6*  HGB 7.8* 6.3*  HCT 22.6* 18.3*  PLT 170 187   BMET Recent Labs    04/17/22 0150 04/18/22 0325  NA 140 137  K 3.5 3.3*  CL 108 103  CO2 24 26  GLUCOSE 113* 116*  BUN 30* 27*  CREATININE 2.14* 1.86*  CALCIUM 8.2* 7.8*    COAG Lab Results  Component Value Date   INR 1.2 04/11/2022   INR 1.5 (H) 04/07/2022   INR 0.9 03/09/2022   No results  found for: "PTT"   VASCULAR STAFF ADDENDUM: I have independently interviewed and examined the patient. I agree with the above.  Infectious workup including blood cultures, CXR, U/A pending.  Patient not very talkative, but does not complain of anything specfic to me.  Incisions all appear OK. VAC failed last night. Will continue wet-to-dry to right calf.   Rande Brunt. Lenell Antu, MD [facility] Phone Number: [phone] 04/18/2022 2:31 PM

## 2022-04-18 NOTE — Progress Notes (Signed)
Pharmacy Antibiotic Note  Jeffrey Fitzgerald is a 51 y.o. male admitted on 04/01/2022 with concern for wound infection. Pt with critical limb ischemia s/p  thrombectomy of L common femoral to posterior tibial artery bypass, medial right fasciotomy closure and vac placement on lateral fasciotomy now wet to dry dressing. Pharmacy has been consulted for Zosyn dosing. Pt is currently afebrile but WBCs are trending up and vascular is concerned for possible infection. Will obtain blood cultures before starting Zosyn per vascular. Scr elevated but improved today at 1.86.   Plan: Zosyn 3.375 gm IV q8h (4 hour infusion) Monitor renal function, s/sx infection, C&S  Height: 5\' 5"  (165.1 cm) Weight: 68.8 kg (151 lb 10.8 oz) IBW/kg (Calculated) : 61.5  Temp (24hrs), Avg:97.8 F (36.6 C), Min:97.5 F (36.4 C), Max:98.1 F (36.7 C)  Recent Labs  Lab 04/14/22 0530 04/15/22 0252 04/16/22 0323 04/17/22 0150 04/18/22 0325  WBC 16.1* 15.4* 15.7* 17.8* 20.6*  CREATININE 2.94* 2.82* 2.49* 2.14* 1.86*    Estimated Creatinine Clearance: 41.3 mL/min (A) (by C-G formula based on SCr of 1.86 mg/dL (H)).    No Known Allergies  Antimicrobials this admission: Zosyn 8/4 >> 8/17; 8/20 >>  Vancomycin 8/4 >> 8/10  Zyvox 8/10>>8/17  Dose adjustments this admission:   Microbiology results: 8/20 BCx: sent  Thank you for allowing pharmacy to be a part of this patient's care.  9/20, PharmD PGY1 Pharmacy Resident   04/18/2022  9:23 AM

## 2022-04-19 LAB — BASIC METABOLIC PANEL
Anion gap: 10 (ref 5–15)
BUN: 27 mg/dL — ABNORMAL HIGH (ref 6–20)
CO2: 24 mmol/L (ref 22–32)
Calcium: 7.7 mg/dL — ABNORMAL LOW (ref 8.9–10.3)
Chloride: 103 mmol/L (ref 98–111)
Creatinine, Ser: 2.12 mg/dL — ABNORMAL HIGH (ref 0.61–1.24)
GFR, Estimated: 37 mL/min — ABNORMAL LOW (ref >60)
Glucose, Bld: 123 mg/dL — ABNORMAL HIGH (ref 70–99)
Potassium: 3.4 mmol/L — ABNORMAL LOW (ref 3.5–5.1)
Sodium: 137 mmol/L (ref 135–145)

## 2022-04-19 LAB — CBC
HCT: 22 % — ABNORMAL LOW (ref 39.0–52.0)
Hemoglobin: 8 g/dL — ABNORMAL LOW (ref 13.0–17.0)
MCH: 31 pg (ref 26.0–34.0)
MCHC: 36.4 g/dL — ABNORMAL HIGH (ref 30.0–36.0)
MCV: 85.3 fL (ref 80.0–100.0)
Platelets: 176 10*3/uL (ref 150–400)
RBC: 2.58 MIL/uL — ABNORMAL LOW (ref 4.22–5.81)
RDW: 14.7 % (ref 11.5–15.5)
WBC: 24.9 10*3/uL — ABNORMAL HIGH (ref 4.0–10.5)
nRBC: 2.4 % — ABNORMAL HIGH (ref 0.0–0.2)

## 2022-04-19 LAB — HEPARIN LEVEL (UNFRACTIONATED): Heparin Unfractionated: 0.17 IU/mL — ABNORMAL LOW (ref 0.30–0.70)

## 2022-04-19 LAB — GLUCOSE, CAPILLARY
Glucose-Capillary: 109 mg/dL — ABNORMAL HIGH (ref 70–99)
Glucose-Capillary: 136 mg/dL — ABNORMAL HIGH (ref 70–99)
Glucose-Capillary: 124 mg/dL — ABNORMAL HIGH (ref 70–99)

## 2022-04-19 MED ORDER — POTASSIUM CHLORIDE ER 10 MEQ PO TBCR
40.0000 meq | EXTENDED_RELEASE_TABLET | Freq: Once | ORAL | Status: DC
Start: 2022-04-19 — End: 2022-04-19
  Filled 2022-04-19: qty 4

## 2022-04-19 MED ORDER — POTASSIUM CHLORIDE CRYS ER 20 MEQ PO TBCR
40.0000 meq | EXTENDED_RELEASE_TABLET | Freq: Once | ORAL | Status: AC
  Administered 2022-04-19: 40 meq via ORAL
  Filled 2022-04-19: qty 2

## 2022-04-19 MED ORDER — VANCOMYCIN HCL 750 MG/150ML IV SOLN
750.0000 mg | INTRAVENOUS | Status: DC
  Administered 2022-04-19 – 2022-04-20 (×2): 750 mg via INTRAVENOUS
  Filled 2022-04-19 (×3): qty 150

## 2022-04-19 NOTE — Progress Notes (Signed)
Pharmacy Antibiotic Note  Jeffrey Fitzgerald is a 51 y.o. male admitted on 04/01/2022 with concern for wound infection. Pt with critical limb ischemia s/p  thrombectomy of L common femoral to posterior tibial artery bypass, medial right fasciotomy closure and vac placement on lateral fasciotomy now wet to dry dressing. Pharmacy has been consulted for Zosyn dosing. Pt is currently afebrile but WBCs are trending up and vascular is concerned for possible infection in wound. Pharmacy asked to add vancomycin with wound looking worse.  Plan: Continue Zosyn 3.375g IV EI q8h Add vancomycin 750mg  IV q24h - est AUC 437  Height: 5\' 5"  (165.1 cm) Weight: 68.8 kg (151 lb 10.8 oz) IBW/kg (Calculated) : 61.5  Temp (24hrs), Avg:97.9 F (36.6 C), Min:97.5 F (36.4 C), Max:98.6 F (37 C)  Recent Labs  Lab 04/15/22 0252 04/16/22 0323 04/17/22 0150 04/18/22 0325 04/19/22 0815  WBC 15.4* 15.7* 17.8* 20.6* 24.9*  CREATININE 2.82* 2.49* 2.14* 1.86* 2.12*     Estimated Creatinine Clearance: 36.3 mL/min (A) (by C-G formula based on SCr of 2.12 mg/dL (H)).    No Known Allergies  Antimicrobials this admission: Zosyn 8/4 >> 8/17; 8/20 >>  Vancomycin 8/4 >> 8/10  Zyvox 8/10>>8/17  Dose adjustments this admission:   Microbiology results: 8/20 BCx: sent  Thank you for allowing pharmacy to be a part of this patient's care.  02-11-1997, PharmD, BCPS, [facility] Clinical Pharmacist [phone] Please check AMION for all [facility] Pharmacy numbers 04/19/2022

## 2022-04-19 NOTE — Progress Notes (Signed)
Right leg, saturated, dressing changed. Bed side RN made aware. [name], Randall An rN

## 2022-04-19 NOTE — Progress Notes (Signed)
ANTICOAGULATION NOTE   Pharmacy Consult for heparin Indication:  PVD  No Known Allergies  Patient Measurements: Height: 5\' 5"  (165.1 cm) Weight: 68.8 kg (151 lb 10.8 oz) IBW/kg (Calculated) : 61.5 Heparin Dosing Weight: 66kg  Vital Signs: Temp: 97.9 F (36.6 C) (08/21 0715) Temp Source: Axillary (08/21 0715) BP: 136/65 (08/21 0715) Pulse Rate: 88 (08/21 0715)  Labs: Recent Labs    04/17/22 0150 04/18/22 0325 04/19/22 0815  HGB 7.8* 6.3* 8.0*  HCT 22.6* 18.3* 22.0*  PLT 170 187 176  HEPARINUNFRC <0.10* <0.10* 0.17*  CREATININE 2.14* 1.86* 2.12*     Estimated Creatinine Clearance: 36.3 mL/min (A) (by C-G formula based on SCr of 2.12 mg/dL (H)).   Medical History: Past Medical History:  Diagnosis Date   Hypertension    Peripheral vascular disease (HCC)    Tobacco abuse     Medications:  Medications Prior to Admission  Medication Sig Dispense Refill Last Dose   acetaminophen (TYLENOL) 500 MG tablet Take 500 mg by mouth every 6 (six) hours as needed for moderate pain.   03/31/2022   ibuprofen (ADVIL) 200 MG tablet Take 200 mg by mouth every 6 (six) hours as needed for moderate pain.   03/31/2022   Scheduled:   aspirin EC  81 mg Oral Daily   atorvastatin  80 mg Oral Daily   Chlorhexidine Gluconate Cloth  6 each Topical Daily   docusate sodium  100 mg Oral Daily   hydrALAZINE  10 mg Oral BID   nicotine  21 mg Transdermal Daily   pantoprazole  40 mg Oral Daily   potassium chloride  40 mEq Oral Once   sodium chloride flush  3 mL Intravenous Q12H   Infusions:   sodium chloride 180 mL/hr at 04/18/22 1708   sodium chloride     heparin 500 Units/hr (04/18/22 1708)   piperacillin-tazobactam (ZOSYN)  IV 3.375 g (04/19/22 0238)    Assessment: Pt with PVD who is s/p bypass. Heparin was ordered by VVS at 500 units/hr without titration.   Heparin level resulted at 0.17 units/mL. CBC stable with Hgb 8.0 and Plt 176. No sings of bleeding reported.  Goal of Therapy:   Monitor platelets by anticoagulation protocol: Yes   Plan:  Continue Heparin 500 units/hr per VVS Daily heparin and CBC Monitor for s/sx of bleeding  04/21/22, PharmD 04/19/2022  10:29 AM   Please check AMION for all [facility] Pharmacy phone numbers After 10:00 PM, call Main Pharmacy [phone]

## 2022-04-19 NOTE — Progress Notes (Signed)
Inpatient Rehab Admissions Coordinator:    Pt. With elevated WBCs, increased drainage from wound. Not yet ready for CIR. I will continue to follow for potential admit pending medical readiness.  Megan Salon, MS, CCC-SLP Rehab Admissions Coordinator  [phone] (celll) [phone] (office)

## 2022-04-19 NOTE — Progress Notes (Signed)
Physical Therapy Treatment Patient Details Name: Jeffrey Fitzgerald MRN: [id number] DOB: 06-14-1971 Today's Date: 04/19/2022   History of Present Illness Pt is a 51 yo male admitted 04/01/22 from cath lab after aortogram for LLE ischemia. S/p aortobifemoral bypass graft with bilateral femoral endarterectomy on 8/9. Post op RLE ischemia with return to OR same date for redo R groin exploration with thrombectomy and angioplasty. S/p RLE fem-pop bypass graft with 4 compartment fasciotomy on 8/10. S/p thrombectomy left femoral to popliteal BPG wtih closure and right fasciotomy wtih VAC on right LE on 8/11. Ileus with emesis on 8/14. PMH includes HTN, tobacco smoking, PVD.    PT Comments    Pt received supine and agreeable to PT/OT session, however limited by pain and decreased activity tolerance. Pt agreeable to EOB/OOB transfer at start of session but continues to delay throughout session. Pt with poor tolerance for LE therex secondary to pain. Pt needing mod assist +2 to reposition in bed. Pt perseverating throughout session on "red and black" bag and needing to find it before continuing mobility, RN aware. Session also limited by increased pain and drainage of RLE. Pt continues to benefit from skilled PT services to progress toward functional mobility goals.    Recommendations for follow up therapy are one component of a multi-disciplinary discharge planning process, led by the attending physician.  Recommendations may be updated based on patient status, additional functional criteria and insurance authorization.  Follow Up Recommendations  Acute inpatient rehab (3hours/day) Can patient physically be transported by private vehicle: No   Assistance Recommended at Discharge Frequent or constant Supervision/Assistance  Patient can return home with the following A lot of help with bathing/dressing/bathroom;Assist for transportation;Direct supervision/assist for financial management;Assistance with  cooking/housework;Help with stairs or ramp for entrance;Direct supervision/assist for medications management;Two people to help with walking and/or transfers   Equipment Recommendations  Rolling walker (2 wheels);BSC/3in1    Recommendations for Other Services       Precautions / Restrictions Precautions Precautions: Fall;Other (comment) Precaution Comments: watch HR, bowel incontience Restrictions Weight Bearing Restrictions: No     Mobility  Bed Mobility Overal bed mobility: Needs Assistance             General bed mobility comments: repositioned in bed with pt pulling with UEs, PT assisting with hips and OT supporting R LE.  mod assist +2 overall, cueing for technique.    Transfers                        Ambulation/Gait                   Stairs             Wheelchair Mobility    Modified Rankin (Stroke Patients Only)       Balance                                            Cognition Arousal/Alertness: Awake/alert Behavior During Therapy: Flat affect Overall Cognitive Status: No family/caregiver present to determine baseline cognitive functioning Area of Impairment: Following commands, Problem solving, Attention                   Current Attention Level: Focused   Following Commands: Follows one step commands consistently, Follows one step commands with increased time     Problem Solving: Slow processing, Decreased  initiation, Difficulty sequencing, Requires verbal cues, Requires tactile cues General Comments: pt with very slow processing, perseverating on needing his "black and red bag" today and requires mulitple redirection to task.        Exercises General Exercises - Lower Extremity Ankle Circles/Pumps: AROM, AAROM, Right, Left, 5 reps, Supine Heel Slides: AROM, AAROM, Right, 5 reps, Supine Hip ABduction/ADduction: AROM, Right, 5 reps, Supine, AAROM Straight Leg Raises: AROM, AAROM, Right,  Left, 5 reps, Supine    General Comments General comments (skin integrity, edema, etc.): VSS on RA; pt with wound vac OFF on R LE.  RN reports possible plan to re-don due to copious drainage.      Pertinent Vitals/Pain Pain Assessment Pain Assessment: Faces Faces Pain Scale: Hurts whole lot Pain Location: R LE Pain Descriptors / Indicators: Guarding, Grimacing, Discomfort, Moaning Pain Intervention(s): Monitored during session, Limited activity within patient's tolerance, Repositioned    Home Living                          Prior Function            PT Goals (current goals can now be found in the care plan section) Acute Rehab PT Goals PT Goal Formulation: With patient Time For Goal Achievement: 04/22/22    Frequency    Min 3X/week      PT Plan Current plan remains appropriate    Co-evaluation PT/OT/SLP Co-Evaluation/Treatment: Yes Reason for Co-Treatment: Complexity of the patient's impairments (multi-system involvement) PT goals addressed during session: Strengthening/ROM;Mobility/safety with mobility OT goals addressed during session: ADL's and self-care      AM-PAC PT "6 Clicks" Mobility   Outcome Measure  Help needed turning from your back to your side while in a flat bed without using bedrails?: A Little Help needed moving from lying on your back to sitting on the side of a flat bed without using bedrails?: A Lot Help needed moving to and from a bed to a chair (including a wheelchair)?: Total Help needed standing up from a chair using your arms (e.g., wheelchair or bedside chair)?: Total Help needed to walk in hospital room?: Total Help needed climbing 3-5 steps with a railing? : Total 6 Click Score: 9    End of Session   Activity Tolerance: Patient limited by pain Patient left: in bed;with call bell/phone within reach;with bed alarm set Nurse Communication: Mobility status PT Visit Diagnosis: Other abnormalities of gait and mobility  (R26.89);Difficulty in walking, not elsewhere classified (R26.2);Muscle weakness (generalized) (M62.81)     Time: 2979-8921 PT Time Calculation (min) (ACUTE ONLY): 35 min  Charges:  $Therapeutic Activity: 8-22 mins            Tawnya Pujol R. PTA Acute Rehabilitation Services Office: [phone]    Catalina Antigua 04/19/2022, 1:32 PM

## 2022-04-19 NOTE — Progress Notes (Addendum)
[facility]  Subjective  - No change    Objective 136/65 88 97.9 F (36.6 C) (Axillary) 12 100%  Intake/Output Summary (Last 24 hours) at 04/19/2022 1001 Last data filed at 04/19/2022 0439 Gross per 24 hour  Intake 1384.69 ml  Output 600 ml  Net 784.69 ml    Right groin with dry clean dressing incisional vac off 04/18/22 by patient Right Lower leg fasciotomy site with wet to dry dressing.  Vac clotted x 2 this week. Doppler signal PT B LE Lungs non labored breathing  DG chest FINDINGS: Interval removal of right IJ catheter and enteric tube. Normal cardiomediastinal contours. Low lung volumes. No pleural effusion or edema. No airspace opacities identified. The visualized osseous structures are unremarkable. Gaseous distension of the bowel loops noted within the imaged portions of the upper abdomen.   IMPRESSION: 1. No acute cardiopulmonary abnormalities. 2. Gaseous distension of the bowel loops noted within the upper abdomen.    Assessment/Planning: Aortoiliac occlusive disease Aortobifemoral, Left common femoral to posterior tibial bypass with composite PTFE and great saphenous vein.  Followed by thrombectomy, then right LE bypass with vein.  He then returned to the OR for   Thrombectomy of left common femoral to posterior tibial artery bypass, medial right fasciotomy closure and vac placement on lateral fasciotomy site.    Good inflow B LE with PT signals Leukocytosis increased now 24.9.  X ray negative for lung disease, UA pending  Specimen Description BLOOD RIGHT ANTECUBITAL   Special Requests BOTTLES DRAWN AEROBIC ONLY Blood Culture adequate volume   Culture NO GROWTH < 24 HOURS  Performed at [facility], [address] [place], [place] [address]   Report Status PENDING    Afebrile.   Empiric Zosyn started 04/18/22 Plan for dressing change and lateral fasciotomy site inspection.  Hypokalemia K+ replaced  Roxy Horseman 04/19/2022 10:01 AM --  Laboratory Lab Results: Recent Labs    04/18/22 0325 04/19/22 0815  WBC 20.6* 24.9*  HGB 6.3* 8.0*  HCT 18.3* 22.0*  PLT 187 176   BMET Recent Labs    04/18/22 0325 04/19/22 0815  NA 137 137  K 3.3* 3.4*  CL 103 103  CO2 26 24  GLUCOSE 116* 123*  BUN 27* 27*  CREATININE 1.86* 2.12*  CALCIUM 7.8* 7.7*    COAG Lab Results  Component Value Date   INR 1.2 04/11/2022   INR 1.5 (H) 04/07/2022   INR 0.9 03/09/2022   No results found for: "PTT"  I have seen and evaluated the patient. I agree with the PA note as documented above.  51 year old male now status post multilevel revascularization include aortobifemoral bypass and bilateral fem-tib bypasses.  White count continues to rise over the weekend and now 24.  Afebrile and does not appear toxic.  Vanc zosyn added back.  I suspect this is from ischemic muscle in the right anterior compartment as pictured.  We will post tomorrow for debridement in the operating room and possible placement of myriad.  Creatinine did increase from 1.8 to 2.1.  We will stop diuresis and hold Lasix.  NPO after midnight.  Marty Heck, MD [facility] Office: [phone]

## 2022-04-19 NOTE — Progress Notes (Signed)
Occupational Therapy Treatment Patient Details Name: Jeffrey Fitzgerald MRN: [id number] DOB: 02/10/1971 Today's Date: 04/19/2022   History of present illness Pt is a 51 yo male admitted 04/01/22 from cath lab after aortogram for LLE ischemia. S/p aortobifemoral bypass graft with bilateral femoral endarterectomy on 8/9. Post op RLE ischemia with return to OR same date for redo R groin exploration with thrombectomy and angioplasty. S/p RLE fem-pop bypass graft with 4 compartment fasciotomy on 8/10. S/p thrombectomy left femoral to popliteal BPG wtih closure and right fasciotomy wtih VAC on right LE on 8/11. Ileus with emesis on 8/14. PMH includes HTN, tobacco smoking, PVD.   OT comments  Pt supine in bed and agreeable to OT/PT session.  Pt limited by pain and decreased activity tolerance today.  He initially reports wanting to get OOB, but then delays this throughout session.  Perseverating on needing his "black and red bag"- RN notified.  Utilized therapeutic use of self and pt reports he just wants to get more comfortable today.  Mod assist +2 to reposition in bed and setup to complete grooming.   Limited today due to increased pain in R LE (wound vac off with drainage).  Will follow acutely.    Recommendations for follow up therapy are one component of a multi-disciplinary discharge planning process, led by the attending physician.  Recommendations may be updated based on patient status, additional functional criteria and insurance authorization.    Follow Up Recommendations  Acute inpatient rehab (3hours/day)    Assistance Recommended at Discharge Frequent or constant Supervision/Assistance  Patient can return home with the following  Two people to help with walking and/or transfers;A lot of help with bathing/dressing/bathroom;Direct supervision/assist for medications management;Assist for transportation;Help with stairs or ramp for entrance   Equipment Recommendations  Wheelchair (measurements  OT);Wheelchair cushion (measurements OT);BSC/3in1    Recommendations for Other Services Rehab consult    Precautions / Restrictions Precautions Precautions: Fall;Other (comment) Precaution Comments: watch HR, bowel incontience Restrictions Weight Bearing Restrictions: No       Mobility Bed Mobility Overal bed mobility: Needs Assistance             General bed mobility comments: repositioned in bed with pt pulling with UEs, PT assisting with hips and OT supporting R LE.  mod assist +2 overall, cueing for technique.    Transfers                         Balance                                           ADL either performed or assessed with clinical judgement   ADL Overall ADL's : Needs assistance/impaired     Grooming: Set up;Wash/dry hands;Wash/dry face;Oral care Grooming Details (indicate cue type and reason): bed level, declined OOB             Lower Body Dressing: Total assistance;Bed level     Toilet Transfer Details (indicate cue type and reason): declined         Functional mobility during ADLs: Moderate assistance;+2 for physical assistance;+2 for safety/equipment General ADL Comments: limited to bed level today    Extremity/Trunk Assessment              Vision       Perception     Praxis      Cognition Arousal/Alertness:  Awake/alert Behavior During Therapy: Flat affect Overall Cognitive Status: No family/caregiver present to determine baseline cognitive functioning Area of Impairment: Following commands, Problem solving, Attention                   Current Attention Level: Focused   Following Commands: Follows one step commands consistently, Follows one step commands with increased time     Problem Solving: Slow processing, Decreased initiation, Difficulty sequencing, Requires verbal cues, Requires tactile cues General Comments: pt with very slow processing, perseverating on needing his "black  and red bag" today and requires mulitple redirection to task.        Exercises      Shoulder Instructions       General Comments VSS on RA; pt with wound vac OFF on R LE.  RN reports possible plan to re-don due to copious drainage.    Pertinent Vitals/ Pain       Pain Assessment Pain Assessment: Faces Faces Pain Scale: Hurts whole lot Pain Location: R LE Pain Descriptors / Indicators: Guarding, Grimacing, Discomfort, Moaning Pain Intervention(s): Limited activity within patient's tolerance, Monitored during session, Repositioned  Home Living                                          Prior Functioning/Environment              Frequency  Min 2X/week        Progress Toward Goals  OT Goals(current goals can now be found in the care plan section)  Progress towards OT goals: OT to reassess next treatment;Not progressing toward goals - comment (limited by pain today, continue assessment)  Acute Rehab OT Goals Patient Stated Goal: less pain OT Goal Formulation: With patient Time For Goal Achievement: 04/25/22 Potential to Achieve Goals: Good  Plan Discharge plan remains appropriate;Frequency remains appropriate    Co-evaluation    PT/OT/SLP Co-Evaluation/Treatment: Yes Reason for Co-Treatment: Complexity of the patient's impairments (multi-system involvement)   OT goals addressed during session: ADL's and self-care      AM-PAC OT "6 Clicks" Daily Activity     Outcome Measure   Help from another person eating meals?: None Help from another person taking care of personal grooming?: A Little Help from another person toileting, which includes using toliet, bedpan, or urinal?: Total Help from another person bathing (including washing, rinsing, drying)?: A Lot Help from another person to put on and taking off regular upper body clothing?: A Little Help from another person to put on and taking off regular lower body clothing?: Total 6 Click Score:  14    End of Session    OT Visit Diagnosis: Unsteadiness on feet (R26.81);Other abnormalities of gait and mobility (R26.89);Muscle weakness (generalized) (M62.81);Other symptoms and signs involving cognitive function;Pain Pain - Right/Left: Right Pain - part of body: Leg   Activity Tolerance Patient limited by pain   Patient Left in bed;with call bell/phone within reach;with bed alarm set   Nurse Communication Mobility status;Other (comment) (pain)        Time: 8341-9622 OT Time Calculation (min): 36 min  Charges: OT General Charges $OT Visit: 1 Visit OT Treatments $Self Care/Home Management : 8-22 mins  Barry Brunner, OT Acute Rehabilitation Services Office [phone]   Chancy Milroy 04/19/2022, 11:25 AM

## 2022-04-20 ENCOUNTER — Encounter (HOSPITAL_COMMUNITY)
Admission: RE | Disposition: A | Discharge status: Home Health | Payer: Self-pay | Source: Home / Self Care | Attending: Vascular Surgery

## 2022-04-20 ENCOUNTER — Inpatient Hospital Stay (HOSPITAL_COMMUNITY): Payer: Medicaid Other | Admitting: Certified Registered Nurse Anesthetist

## 2022-04-20 ENCOUNTER — Encounter (HOSPITAL_COMMUNITY): Payer: Self-pay | Admitting: Vascular Surgery

## 2022-04-20 ENCOUNTER — Other Ambulatory Visit: Payer: Self-pay

## 2022-04-20 DIAGNOSIS — T82868A Thrombosis of vascular prosthetic devices, implants and grafts, initial encounter: Secondary | ICD-10-CM

## 2022-04-20 DIAGNOSIS — I739 Peripheral vascular disease, unspecified: Secondary | ICD-10-CM

## 2022-04-20 DIAGNOSIS — S81801A Unspecified open wound, right lower leg, initial encounter: Secondary | ICD-10-CM

## 2022-04-20 DIAGNOSIS — F17210 Nicotine dependence, cigarettes, uncomplicated: Secondary | ICD-10-CM

## 2022-04-20 DIAGNOSIS — N289 Disorder of kidney and ureter, unspecified: Secondary | ICD-10-CM

## 2022-04-20 DIAGNOSIS — I97618 Postprocedural hemorrhage and hematoma of a circulatory system organ or structure following other circulatory system procedure: Secondary | ICD-10-CM

## 2022-04-20 DIAGNOSIS — I10 Essential (primary) hypertension: Secondary | ICD-10-CM

## 2022-04-20 HISTORY — PX: APPLICATION OF WOUND VAC: SHX5189

## 2022-04-20 HISTORY — PX: FEMORAL ARTERY EXPLORATION: SHX5160

## 2022-04-20 HISTORY — PX: WOUND DEBRIDEMENT: SHX247

## 2022-04-20 LAB — GLUCOSE, CAPILLARY
Glucose-Capillary: 105 mg/dL — ABNORMAL HIGH (ref 70–99)
Glucose-Capillary: 102 mg/dL — ABNORMAL HIGH (ref 70–99)

## 2022-04-20 LAB — CBC
HCT: 17 % — ABNORMAL LOW (ref 39.0–52.0)
Hemoglobin: 6 g/dL — CL (ref 13.0–17.0)
MCH: 30.6 pg (ref 26.0–34.0)
MCHC: 35.3 g/dL (ref 30.0–36.0)
MCV: 86.7 fL (ref 80.0–100.0)
Platelets: 205 10*3/uL (ref 150–400)
RBC: 1.96 MIL/uL — ABNORMAL LOW (ref 4.22–5.81)
RDW: 15 % (ref 11.5–15.5)
WBC: 27.7 10*3/uL — ABNORMAL HIGH (ref 4.0–10.5)
nRBC: 1.7 % — ABNORMAL HIGH (ref 0.0–0.2)

## 2022-04-20 LAB — POCT I-STAT EG7
Acid-base deficit: 1 mmol/L (ref 0.0–2.0)
Bicarbonate: 23.9 mmol/L (ref 20.0–28.0)
Calcium, Ion: 0.95 mmol/L — ABNORMAL LOW (ref 1.15–1.40)
HCT: 24 % — ABNORMAL LOW (ref 39.0–52.0)
Hemoglobin: 8.2 g/dL — ABNORMAL LOW (ref 13.0–17.0)
O2 Saturation: 97 %
Potassium: 4.6 mmol/L (ref 3.5–5.1)
Sodium: 139 mmol/L (ref 135–145)
TCO2: 25 mmol/L (ref 22–32)
pCO2, Ven: 38.1 mmHg — ABNORMAL LOW (ref 44–60)
pH, Ven: 7.405 (ref 7.25–7.43)
pO2, Ven: 92 mmHg — ABNORMAL HIGH (ref 32–45)

## 2022-04-20 LAB — POCT I-STAT, CHEM 8
BUN: 20 mg/dL (ref 6–20)
Calcium, Ion: 1.07 mmol/L — ABNORMAL LOW (ref 1.15–1.40)
Chloride: 104 mmol/L (ref 98–111)
Creatinine, Ser: 2 mg/dL — ABNORMAL HIGH (ref 0.61–1.24)
Glucose, Bld: 114 mg/dL — ABNORMAL HIGH (ref 70–99)
HCT: 24 % — ABNORMAL LOW (ref 39.0–52.0)
Hemoglobin: 8.2 g/dL — ABNORMAL LOW (ref 13.0–17.0)
Potassium: 3.8 mmol/L (ref 3.5–5.1)
Sodium: 138 mmol/L (ref 135–145)
TCO2: 23 mmol/L (ref 22–32)

## 2022-04-20 LAB — BASIC METABOLIC PANEL
Anion gap: 9 (ref 5–15)
BUN: 22 mg/dL — ABNORMAL HIGH (ref 6–20)
CO2: 24 mmol/L (ref 22–32)
Calcium: 7.7 mg/dL — ABNORMAL LOW (ref 8.9–10.3)
Chloride: 106 mmol/L (ref 98–111)
Creatinine, Ser: 2.16 mg/dL — ABNORMAL HIGH (ref 0.61–1.24)
GFR, Estimated: 36 mL/min — ABNORMAL LOW (ref >60)
Glucose, Bld: 119 mg/dL — ABNORMAL HIGH (ref 70–99)
Potassium: 3.6 mmol/L (ref 3.5–5.1)
Sodium: 139 mmol/L (ref 135–145)

## 2022-04-20 LAB — PREPARE RBC (CROSSMATCH)

## 2022-04-20 LAB — HEPARIN LEVEL (UNFRACTIONATED): Heparin Unfractionated: <0.1 IU/mL — ABNORMAL LOW (ref 0.30–0.70)

## 2022-04-20 SURGERY — EXPLORATION, ARTERY, FEMORAL
Anesthesia: General | Site: Groin | Laterality: Right

## 2022-04-20 SURGERY — DEBRIDEMENT, WOUND
Anesthesia: General | Laterality: Right

## 2022-04-20 MED ORDER — PROPOFOL 10 MG/ML IV BOLUS
INTRAVENOUS | Status: AC
  Filled 2022-04-20: qty 20

## 2022-04-20 MED ORDER — PROPOFOL 10 MG/ML IV BOLUS
INTRAVENOUS | Status: DC | PRN
  Administered 2022-04-20: 140 mg via INTRAVENOUS

## 2022-04-20 MED ORDER — EPHEDRINE 5 MG/ML INJ
INTRAVENOUS | Status: AC
  Filled 2022-04-20: qty 5

## 2022-04-20 MED ORDER — FENTANYL CITRATE (PF) 250 MCG/5ML IJ SOLN
INTRAMUSCULAR | Status: DC | PRN
  Administered 2022-04-20 (×2): 50 ug via INTRAVENOUS
  Administered 2022-04-20 (×2): 25 ug via INTRAVENOUS

## 2022-04-20 MED ORDER — SUCCINYLCHOLINE CHLORIDE 200 MG/10ML IV SOSY
PREFILLED_SYRINGE | INTRAVENOUS | Status: AC
  Filled 2022-04-20: qty 10

## 2022-04-20 MED ORDER — PHENYLEPHRINE HCL-NACL 20-0.9 MG/250ML-% IV SOLN
INTRAVENOUS | Status: DC | PRN
  Administered 2022-04-20: 50 ug/min via INTRAVENOUS

## 2022-04-20 MED ORDER — 0.9 % SODIUM CHLORIDE (POUR BTL) OPTIME
TOPICAL | Status: DC | PRN
  Administered 2022-04-20: 1000 mL

## 2022-04-20 MED ORDER — DEXAMETHASONE SODIUM PHOSPHATE 10 MG/ML IJ SOLN
INTRAMUSCULAR | Status: AC
  Filled 2022-04-20: qty 2

## 2022-04-20 MED ORDER — MIDAZOLAM HCL 2 MG/2ML IJ SOLN
INTRAMUSCULAR | Status: AC
  Filled 2022-04-20: qty 2

## 2022-04-20 MED ORDER — SODIUM CHLORIDE 0.9 % IR SOLN
Status: DC | PRN
  Administered 2022-04-20: 1000 mL

## 2022-04-20 MED ORDER — ROCURONIUM 10MG/ML (10ML) SYRINGE FOR MEDFUSION PUMP - OPTIME
INTRAVENOUS | Status: DC | PRN
  Administered 2022-04-20: 50 mg via INTRAVENOUS

## 2022-04-20 MED ORDER — LIDOCAINE HCL (CARDIAC) PF 100 MG/5ML IV SOSY
PREFILLED_SYRINGE | INTRAVENOUS | Status: DC | PRN
  Administered 2022-04-20: 80 mg via INTRAVENOUS

## 2022-04-20 MED ORDER — PHENYLEPHRINE 80 MCG/ML (10ML) SYRINGE FOR IV PUSH (FOR BLOOD PRESSURE SUPPORT)
PREFILLED_SYRINGE | INTRAVENOUS | Status: AC
  Filled 2022-04-20: qty 30

## 2022-04-20 MED ORDER — HYDROMORPHONE HCL 1 MG/ML IJ SOLN: 0.2500 mg | INTRAMUSCULAR | Status: DC | PRN

## 2022-04-20 MED ORDER — DEXAMETHASONE SODIUM PHOSPHATE 10 MG/ML IJ SOLN
INTRAMUSCULAR | Status: DC | PRN
  Administered 2022-04-20: 4 mg via INTRAVENOUS

## 2022-04-20 MED ORDER — LACTATED RINGERS IV SOLN: INTRAVENOUS | Status: DC | PRN

## 2022-04-20 MED ORDER — DEXAMETHASONE SODIUM PHOSPHATE 10 MG/ML IJ SOLN
INTRAMUSCULAR | Status: DC | PRN
  Administered 2022-04-20: 10 mg via INTRAVENOUS

## 2022-04-20 MED ORDER — LIDOCAINE 2% (20 MG/ML) 5 ML SYRINGE
INTRAMUSCULAR | Status: DC | PRN
  Administered 2022-04-20: 100 mg via INTRAVENOUS

## 2022-04-20 MED ORDER — FENTANYL CITRATE (PF) 250 MCG/5ML IJ SOLN
INTRAMUSCULAR | Status: DC | PRN
  Administered 2022-04-20: 50 ug via INTRAVENOUS

## 2022-04-20 MED ORDER — ROCURONIUM BROMIDE 10 MG/ML (PF) SYRINGE
PREFILLED_SYRINGE | INTRAVENOUS | Status: AC
  Filled 2022-04-20: qty 10

## 2022-04-20 MED ORDER — PHENYLEPHRINE HCL (PRESSORS) 10 MG/ML IV SOLN
INTRAVENOUS | Status: AC
  Filled 2022-04-20: qty 1

## 2022-04-20 MED ORDER — DEXAMETHASONE SODIUM PHOSPHATE 10 MG/ML IJ SOLN
INTRAMUSCULAR | Status: AC
  Filled 2022-04-20: qty 1

## 2022-04-20 MED ORDER — SODIUM CHLORIDE 0.9 % IV SOLN: INTRAVENOUS | Status: DC | PRN

## 2022-04-20 MED ORDER — FENTANYL CITRATE (PF) 250 MCG/5ML IJ SOLN
INTRAMUSCULAR | Status: AC
  Filled 2022-04-20: qty 5

## 2022-04-20 MED ORDER — SUGAMMADEX SODIUM 200 MG/2ML IV SOLN
INTRAVENOUS | Status: DC | PRN
  Administered 2022-04-20: 200 mg via INTRAVENOUS

## 2022-04-20 MED ORDER — PHENYLEPHRINE HCL (PRESSORS) 10 MG/ML IV SOLN
INTRAVENOUS | Status: DC | PRN
  Administered 2022-04-20 (×3): 160 ug via INTRAVENOUS

## 2022-04-20 MED ORDER — HEPARIN 6000 UNIT IRRIGATION SOLUTION
Status: DC | PRN
  Administered 2022-04-20: 1

## 2022-04-20 MED ORDER — SODIUM CHLORIDE 0.9% IV SOLUTION: Freq: Once | INTRAVENOUS | Status: DC

## 2022-04-20 MED ORDER — FENTANYL CITRATE (PF) 100 MCG/2ML IJ SOLN: 25.0000 ug | INTRAMUSCULAR | Status: DC | PRN

## 2022-04-20 MED ORDER — MIDAZOLAM HCL 2 MG/2ML IJ SOLN
INTRAMUSCULAR | Status: DC | PRN
  Administered 2022-04-20: 2 mg via INTRAVENOUS

## 2022-04-20 MED ORDER — ONDANSETRON HCL 4 MG/2ML IJ SOLN
INTRAMUSCULAR | Status: AC
  Filled 2022-04-20: qty 2

## 2022-04-20 MED ORDER — HEPARIN 6000 UNIT IRRIGATION SOLUTION
Status: AC
  Filled 2022-04-20: qty 500

## 2022-04-20 MED ORDER — ORAL CARE MOUTH RINSE
15.0000 mL | Freq: Once | OROMUCOSAL | Status: AC
Start: 2022-04-20 — End: 2022-04-20

## 2022-04-20 MED ORDER — SUCCINYLCHOLINE 20MG/ML (10ML) SYRINGE FOR MEDFUSION PUMP - OPTIME
INTRAMUSCULAR | Status: DC | PRN
  Administered 2022-04-20: 120 mg via INTRAVENOUS

## 2022-04-20 MED ORDER — ONDANSETRON HCL 4 MG/2ML IJ SOLN
INTRAMUSCULAR | Status: DC | PRN
  Administered 2022-04-20: 4 mg via INTRAVENOUS

## 2022-04-20 MED ORDER — LIDOCAINE 2% (20 MG/ML) 5 ML SYRINGE
INTRAMUSCULAR | Status: AC
  Filled 2022-04-20: qty 15

## 2022-04-20 MED ORDER — SODIUM CHLORIDE 0.9 % IV SOLN: INTRAVENOUS | Status: DC

## 2022-04-20 MED ORDER — CHLORHEXIDINE GLUCONATE 0.12 % MT SOLN
15.0000 mL | Freq: Once | OROMUCOSAL | Status: AC
  Administered 2022-04-20: 15 mL via OROMUCOSAL
  Filled 2022-04-20: qty 15

## 2022-04-20 MED ORDER — ONDANSETRON HCL 4 MG/2ML IJ SOLN
INTRAMUSCULAR | Status: AC
Start: 2022-04-20 — End: ?
  Filled 2022-04-20: qty 2

## 2022-04-20 MED ORDER — PROPOFOL 10 MG/ML IV BOLUS
INTRAVENOUS | Status: DC | PRN
  Administered 2022-04-20: 160 mg via INTRAVENOUS

## 2022-04-20 SURGICAL SUPPLY — 50 items
BAG COUNTER SPONGE SURGICOUNT (BAG) ×1 IMPLANT
BNDG ELASTIC 4X5.8 VLCR STR LF (GAUZE/BANDAGES/DRESSINGS) IMPLANT
BNDG ELASTIC 6X5.8 VLCR STR LF (GAUZE/BANDAGES/DRESSINGS) IMPLANT
BNDG GAUZE DERMACEA FLUFF (GAUZE/BANDAGES/DRESSINGS) ×1
BNDG GAUZE DERMACEA FLUFF 4 (GAUZE/BANDAGES/DRESSINGS) IMPLANT
BNDG GAUZE ELAST 4 BULKY (GAUZE/BANDAGES/DRESSINGS) IMPLANT
BRUSH SCRUB EZ PLAIN DRY (MISCELLANEOUS) IMPLANT
CANISTER SUCT 3000ML PPV (MISCELLANEOUS) ×1 IMPLANT
COVER SURGICAL LIGHT HANDLE (MISCELLANEOUS) ×2 IMPLANT
DERMABOND ADVANCED (GAUZE/BANDAGES/DRESSINGS) ×1
DERMABOND ADVANCED .7 DNX12 (GAUZE/BANDAGES/DRESSINGS) ×1 IMPLANT
DRAPE HALF SHEET 40X57 (DRAPES) IMPLANT
DRAPE ORTHO SPLIT 77X108 STRL (DRAPES) ×1
DRAPE SURG ORHT 6 SPLT 77X108 (DRAPES) IMPLANT
DRSG CUTIMED SORBACT 7X9 (GAUZE/BANDAGES/DRESSINGS) IMPLANT
DRSG VAC ATS SM SENSATRAC (GAUZE/BANDAGES/DRESSINGS) IMPLANT
ELECT REM PT RETURN 9FT ADLT (ELECTROSURGICAL) ×1
ELECTRODE REM PT RTRN 9FT ADLT (ELECTROSURGICAL) ×1 IMPLANT
EVACUATOR SILICONE 100CC (DRAIN) IMPLANT
GAUZE SPONGE 4X4 12PLY STRL (GAUZE/BANDAGES/DRESSINGS) ×1 IMPLANT
GAUZE SPONGE 4X4 12PLY STRL LF (GAUZE/BANDAGES/DRESSINGS) IMPLANT
GAUZE XEROFORM 1X8 LF (GAUZE/BANDAGES/DRESSINGS) IMPLANT
GLOVE BIO SURGEON STRL SZ7.5 (GLOVE) ×1 IMPLANT
GLOVE BIOGEL PI IND STRL 8 (GLOVE) ×1 IMPLANT
GLOVE BIOGEL PI INDICATOR 8 (GLOVE) ×1
GLOVE ECLIPSE 8.0 STRL XLNG CF (GLOVE) IMPLANT
GLOVE SRG 8 PF TXTR STRL LF DI (GLOVE) ×1 IMPLANT
GLOVE SURG POLYISO LF SZ8 (GLOVE) IMPLANT
GLOVE SURG UNDER POLY LF SZ8 (GLOVE) ×1
GOWN STRL REUS W/ TWL LRG LVL3 (GOWN DISPOSABLE) ×2 IMPLANT
GOWN STRL REUS W/TWL 2XL LVL3 (GOWN DISPOSABLE) ×2 IMPLANT
GOWN STRL REUS W/TWL LRG LVL3 (GOWN DISPOSABLE) ×2
KIT BASIN OR (CUSTOM PROCEDURE TRAY) ×1 IMPLANT
KIT TURNOVER KIT B (KITS) ×1 IMPLANT
MARKER SKIN DUAL TIP RULER LAB (MISCELLANEOUS) IMPLANT
NS IRRIG 1000ML POUR BTL (IV SOLUTION) ×1 IMPLANT
PACK CV ACCESS (CUSTOM PROCEDURE TRAY) ×1 IMPLANT
PACK GENERAL/GYN (CUSTOM PROCEDURE TRAY) IMPLANT
PAD ARMBOARD 7.5X6 YLW CONV (MISCELLANEOUS) ×2 IMPLANT
STAPLER VISISTAT 35W (STAPLE) IMPLANT
SUT ETHILON 3 0 PS 1 (SUTURE) IMPLANT
SUT MNCRL AB 4-0 PS2 18 (SUTURE) IMPLANT
SUT VIC AB 2-0 CT1 27 (SUTURE)
SUT VIC AB 2-0 CT1 TAPERPNT 27 (SUTURE) IMPLANT
SUT VIC AB 2-0 CTB1 (SUTURE) IMPLANT
SUT VIC AB 3-0 SH 27 (SUTURE)
SUT VIC AB 3-0 SH 27X BRD (SUTURE) IMPLANT
SYSTEM VACUUM SOMAVAC SVS (PUMP) IMPLANT
TOWEL GREEN STERILE (TOWEL DISPOSABLE) ×1 IMPLANT
WATER STERILE IRR 1000ML POUR (IV SOLUTION) ×1 IMPLANT

## 2022-04-20 SURGICAL SUPPLY — 49 items
BAG COUNTER SPONGE SURGICOUNT (BAG) ×1 IMPLANT
BNDG ELASTIC 4X5.8 VLCR STR LF (GAUZE/BANDAGES/DRESSINGS) IMPLANT
CANISTER SUCT 3000ML PPV (MISCELLANEOUS) ×1 IMPLANT
CANISTER WOUNDNEG PRESSURE 500 (CANNISTER) IMPLANT
CLIP LIGATING EXTRA MED SLVR (CLIP) ×1 IMPLANT
CLIP LIGATING EXTRA SM BLUE (MISCELLANEOUS) ×1 IMPLANT
CLIP TI MEDIUM 6 (CLIP) IMPLANT
CLIP TI WIDE RED SMALL 6 (CLIP) IMPLANT
DERMABOND ADVANCED (GAUZE/BANDAGES/DRESSINGS) ×1
DERMABOND ADVANCED .7 DNX12 (GAUZE/BANDAGES/DRESSINGS) ×1 IMPLANT
DRAIN CHANNEL 15F RND FF W/TCR (WOUND CARE) IMPLANT
DRAPE X-RAY CASS 24X20 (DRAPES) IMPLANT
DRSG VAC ATS MED SENSATRAC (GAUZE/BANDAGES/DRESSINGS) IMPLANT
ELECT REM PT RETURN 9FT ADLT (ELECTROSURGICAL) ×1
ELECTRODE REM PT RTRN 9FT ADLT (ELECTROSURGICAL) ×1 IMPLANT
EVACUATOR SILICONE 100CC (DRAIN) IMPLANT
FELT TEFLON 1X6 (MISCELLANEOUS) IMPLANT
GLOVE BIO SURGEON STRL SZ7.5 (GLOVE) ×1 IMPLANT
GOWN STRL REUS W/ TWL LRG LVL3 (GOWN DISPOSABLE) ×2 IMPLANT
GOWN STRL REUS W/ TWL XL LVL3 (GOWN DISPOSABLE) ×1 IMPLANT
GOWN STRL REUS W/TWL LRG LVL3 (GOWN DISPOSABLE) ×2
GOWN STRL REUS W/TWL XL LVL3 (GOWN DISPOSABLE) ×1
HANDPIECE INTERPULSE COAX TIP (DISPOSABLE) ×1
HEMOSTAT SNOW SURGICEL 2X4 (HEMOSTASIS) IMPLANT
KIT BASIN OR (CUSTOM PROCEDURE TRAY) ×1 IMPLANT
KIT TURNOVER KIT B (KITS) ×1 IMPLANT
MARKER GRAFT CORONARY BYPASS (MISCELLANEOUS) IMPLANT
NS IRRIG 1000ML POUR BTL (IV SOLUTION) ×2 IMPLANT
PACK PERIPHERAL VASCULAR (CUSTOM PROCEDURE TRAY) ×1 IMPLANT
PAD ARMBOARD 7.5X6 YLW CONV (MISCELLANEOUS) ×2 IMPLANT
SET COLLECT BLD 21X3/4 12 (NEEDLE) IMPLANT
SET HNDPC FAN SPRY TIP SCT (DISPOSABLE) IMPLANT
STOPCOCK 4 WAY LG BORE MALE ST (IV SETS) IMPLANT
SUT ETHILON 3 0 PS 1 (SUTURE) IMPLANT
SUT PROLENE 5 0 C 1 24 (SUTURE) ×1 IMPLANT
SUT PROLENE 6 0 BV (SUTURE) ×1 IMPLANT
SUT SILK 3 0 (SUTURE)
SUT SILK 3-0 18XBRD TIE 12 (SUTURE) IMPLANT
SUT VIC AB 2-0 CT1 18 (SUTURE) IMPLANT
SUT VIC AB 2-0 CT1 27 (SUTURE) ×2
SUT VIC AB 2-0 CT1 TAPERPNT 27 (SUTURE) ×2 IMPLANT
SUT VIC AB 3-0 SH 27 (SUTURE) ×2
SUT VIC AB 3-0 SH 27X BRD (SUTURE) ×2 IMPLANT
SUT VICRYL 4-0 PS2 18IN ABS (SUTURE) ×2 IMPLANT
TOWEL GREEN STERILE (TOWEL DISPOSABLE) ×1 IMPLANT
TRAY FOLEY MTR SLVR 16FR STAT (SET/KITS/TRAYS/PACK) IMPLANT
TUBING EXTENTION W/L.L. (IV SETS) IMPLANT
UNDERPAD 30X36 HEAVY ABSORB (UNDERPADS AND DIAPERS) ×1 IMPLANT
WATER STERILE IRR 1000ML POUR (IV SOLUTION) ×1 IMPLANT

## 2022-04-20 NOTE — Progress Notes (Signed)
Inpatient Rehab Admissions Coordinator:    Pt. Back to OR today. Not medically ready for CIR at this time. Will follow for potential admit once medically ready.  Megan Salon, MS, CCC-SLP Rehab Admissions Coordinator  [phone] (celll) [phone] (office)

## 2022-04-20 NOTE — Transfer of Care (Signed)
Immediate Anesthesia Transfer of Care Note  Patient: Jeffrey Fitzgerald  Procedure(s) Performed: EXPLORATION OF RIGHT GROIN (Right: Groin) DEBRIDEMENT WOUND, repaired anstamosis femoral artery and creation of sartorious flap. (Right: Groin) APPLICATION OF WOUND VAC (Right: Groin)  Patient Location: PACU  Anesthesia Type:General  Level of Consciousness: sedated  Airway & Oxygen Therapy: Patient Spontanous Breathing  Post-op Assessment: Report given to RN and Post -op Vital signs reviewed and stable  Post vital signs: Reviewed and stable  Last Vitals:  Vitals Value Taken Time  BP 141/71 04/20/22 2130  Temp    Pulse 104   Resp 14 04/20/22 2132  SpO2 99%   Vitals shown include unvalidated device data.  Last Pain:  Vitals:   04/20/22 1905  TempSrc: Oral  PainSc:       Patients Stated Pain Goal: 0 (04/15/22 0010)  Complications: No notable events documented.

## 2022-04-20 NOTE — Anesthesia Procedure Notes (Signed)
Procedure Name: Intubation Date/Time: 04/20/2022 8:18 PM  Performed by: Molli Hazard, CRNAPre-anesthesia Checklist: Patient identified, Emergency Drugs available, Suction available and Patient being monitored Patient Re-evaluated:Patient Re-evaluated prior to induction Oxygen Delivery Method: Circle system utilized Preoxygenation: Pre-oxygenation with 100% oxygen Induction Type: IV induction and Rapid sequence Laryngoscope Size: Miller and 2 Grade View: Grade II Tube type: Oral Tube size: 7.5 mm Number of attempts: 1 Airway Equipment and Method: Stylet Placement Confirmation: ETT inserted through vocal cords under direct vision, positive ETCO2 and breath sounds checked- equal and bilateral Secured at: 23 cm Tube secured with: Tape Dental Injury: Teeth and Oropharynx as per pre-operative assessment

## 2022-04-20 NOTE — Progress Notes (Signed)
ANTICOAGULATION NOTE   Pharmacy Consult for heparin Indication:  PVD  No Known Allergies  Patient Measurements: Height: 5' 5"  (165.1 cm) Weight: 68.8 kg (151 lb 10.8 oz) IBW/kg (Calculated) : 61.5 Heparin Dosing Weight: 66kg  Vital Signs: Temp: 98.2 F (36.8 C) (08/22 0800) Temp Source: Oral (08/22 0800) BP: 142/67 (08/22 0800) Pulse Rate: 100 (08/22 0800)  Labs: Recent Labs    04/18/22 0325 04/19/22 0815 04/20/22 0322  HGB 6.3* 8.0* 6.0*  HCT 18.3* 22.0* 17.0*  PLT 187 176 205  HEPARINUNFRC <0.10* 0.17* <0.10*  CREATININE 1.86* 2.12* 2.16*     Estimated Creatinine Clearance: 35.6 mL/min (A) (by C-G formula based on SCr of 2.16 mg/dL (H)).   Medical History: Past Medical History:  Diagnosis Date   Hypertension    Peripheral vascular disease ([place])    Tobacco abuse     Medications:  Medications Prior to Admission  Medication Sig Dispense Refill Last Dose   acetaminophen (TYLENOL) 500 MG tablet Take 500 mg by mouth every 6 (six) hours as needed for moderate pain.   03/31/2022   ibuprofen (ADVIL) 200 MG tablet Take 200 mg by mouth every 6 (six) hours as needed for moderate pain.   03/31/2022   Scheduled:   sodium chloride   Intravenous Once   aspirin EC  81 mg Oral Daily   atorvastatin  80 mg Oral Daily   Chlorhexidine Gluconate Cloth  6 each Topical Daily   docusate sodium  100 mg Oral Daily   hydrALAZINE  10 mg Oral BID   nicotine  21 mg Transdermal Daily   pantoprazole  40 mg Oral Daily   sodium chloride flush  3 mL Intravenous Q12H   Infusions:   sodium chloride 180 mL/hr at 04/18/22 1708   sodium chloride     heparin 500 Units/hr (04/20/22 0217)   piperacillin-tazobactam (ZOSYN)  IV 3.375 g (04/20/22 0321)   vancomycin 750 mg (04/19/22 1953)    Assessment: Pt with PVD who is s/p bypass. Heparin was ordered by VVS at 500 units/hr without titration.   Heparin level resulted at <0.1 units/mL. Hgb decreased to 6 overnight - received pRBC transfusion.  Bleeding only noted from wound. Vital signs stable. Plan to return to OR today for debridement with possible placement of myriad. Discussed with VVS - okay to continue for today and will monitor closely.  Goal of Therapy:  Monitor platelets by anticoagulation protocol: Yes   Plan:  Continue Heparin 500 units/hr per VVS Daily heparin and CBC Monitor for s/sx of bleeding  Erskine Speed, PharmD 04/20/2022  8:23 AM   Please check AMION for all [place] phone numbers After 10:00 PM, call [facility] [phone]

## 2022-04-20 NOTE — Op Note (Signed)
Patient name: Jeffrey Fitzgerald MRN: [id number] DOB: 07-28-1971 Sex: male  04/20/2022 Pre-operative Diagnosis: Bleeding from right groin postoperative wound Post-operative diagnosis:  Same Surgeon:  Apolinar Junes C. Randie Heinz, MD Assistant: Loel Dubonnet, PA Procedure Performed: 1.  Reexploration and washout of right groin wound less than 30 days postoperatively 2.  Sharp excisional debridement of right groin wound to 14 x 9 x 4 cm 3.  Repair of aortobifemoral to profunda femoris artery bypass with pledgeted suture 4.  Right groin sartorius muscle flap coverage 5.  Application negative pressure dressing   Indications: 51 year old male initially underwent aortobifemoral bypass and subsequent bilateral femoral to below-knee bypasses with PTFE and is also required fasciotomies.  Today he underwent debridement of his right groin wound and was noted to have significant bleeding this evening and is now indicated for operative exploration.  Findings: There was significant tissue necrosis underneath the intact suture line which was all debridement back to healthy appearing tissue using sharp excisional debridement with scissors and cautery.  There was bleeding from the lateral aspect of the toe of the aortobifemoral bypass graft onto the profunda femoris artery which was repaired with a pledgeted suture.  After debridement there was a very large wound and this was covered with sartorius muscle flap which did appear healthy and was reactive to cautery.  The wound was hemostatic and the wound VAC was placed.   Procedure:  The patient was identified in the holding area and taken to the operating room where he was placed supine the operative table and general anesthesia was induced.  He was sterilely prepped and draped in the right groin wound in the usual fashion, antibiotics were up-to-date timeout was called.  We began by extending the groin wound more cephalad I removed the staples.  Underneath this we did  identify a significant old appearing hematoma and we open the entire to the wound on the bypass graft.  Primarily on the medial aspect of the groin wound there was necrotic tissue and this was debrided back to healthy tissue.  There was hematoma around the bypass graft itself we first obtained control of both PTFE bypass graft in the background aortobifemoral bypass graft.  I then removed the hematoma surrounding the graft and there was an arterial bleeder near the toe of the graft onto the profunda.  This appeared to be an isolated area and was primarily repaired with interrupted 5-0 Prolene suture with a pledget.  This is appear to obtain hemostasis well.  I then washed out the groin wound with pulse evacuation totaling 1 L.  Further debridement which was sharp excisional with scissors and cautery was performed medially.  Hemostasis was then obtained throughout the wound.  I then began to mobilize the sartorius muscle bluntly from a lateral to medial fashion up to the anterior superior iliac spine where it was removed with cautery.  I then bluntly mobilized it medially and rotated medially where it was tacked to the inguinal ligament as well as more inferiorly tacked to more healthy-appearing tissue.  This all appeared viable again the wound was irrigated I then placed a wound vacuum to cover the entirety of the wound and this was hooked to suction.  The patient was then awakened from anesthesia having tolerated procedure without any complication.  All counts were correct at completion.  EBL: 100 cc  Transfusion: 2 units packed red blood cells   Conda Wannamaker C. Randie Heinz, MD [facility] [place] Office: [phone] Pager: [phone]

## 2022-04-20 NOTE — Progress Notes (Signed)
    I was initially notified this evening of malfunctioning wound VAC due to bleeding.  I was subsequently called at the bedside for persistent bleeding.  On evaluation there is superficial dehiscence of the wound which appears stable based on operative note from earlier today.  There is what appears to be bright red blood welling up from the wound bed concerning for graft dehiscence.  I discussed with the patient the need for urgent operative exploration and he demonstrates good understanding and consent was signed.  Lemar Livings, MD

## 2022-04-20 NOTE — Anesthesia Preprocedure Evaluation (Addendum)
Anesthesia Evaluation  Patient identified by MRN, date of birth, ID band Patient awake    Reviewed: Allergy & Precautions, NPO status , Patient's Chart, lab work & pertinent test results  History of Anesthesia Complications Negative for: history of anesthetic complications  Airway Mallampati: II  TM Distance: >3 FB Neck ROM: Full    Dental  (+) Missing,    Pulmonary Current Smoker and Patient abstained from smoking.,    Pulmonary exam normal        Cardiovascular hypertension, + Peripheral Vascular Disease  Normal cardiovascular exam     Neuro/Psych negative neurological ROS  negative psych ROS   GI/Hepatic negative GI ROS, Neg liver ROS,   Endo/Other  negative endocrine ROS  Renal/GU   negative genitourinary   Musculoskeletal negative musculoskeletal ROS (+)   Abdominal   Peds  Hematology  (+) Blood dyscrasia (Hgb 6.0), anemia ,   Anesthesia Other Findings Day of surgery medications reviewed with patient.  Reproductive/Obstetrics negative OB ROS                            Anesthesia Physical Anesthesia Plan  ASA: 3  Anesthesia Plan: General   Post-op Pain Management: Tylenol PO (pre-op)*   Induction: Intravenous  PONV Risk Score and Plan: 2 and Treatment may vary due to age or medical condition, Midazolam, Dexamethasone and Ondansetron  Airway Management Planned: LMA  Additional Equipment: None  Intra-op Plan:   Post-operative Plan: Extubation in OR  Informed Consent: I have reviewed the patients History and Physical, chart, labs and discussed the procedure including the risks, benefits and alternatives for the proposed anesthesia with the patient or authorized representative who has indicated his/her understanding and acceptance.     Dental advisory given  Plan Discussed with: CRNA  Anesthesia Plan Comments:        Anesthesia Quick Evaluation

## 2022-04-20 NOTE — Anesthesia Procedure Notes (Signed)
Procedure Name: LMA Insertion Date/Time: 04/20/2022 9:55 AM  Performed by: Drema Pry, CRNAPre-anesthesia Checklist: Patient identified, Emergency Drugs available, Suction available and Patient being monitored Patient Re-evaluated:Patient Re-evaluated prior to induction Oxygen Delivery Method: Circle System Utilized Preoxygenation: Pre-oxygenation with 100% oxygen Induction Type: IV induction Ventilation: Mask ventilation without difficulty LMA: LMA inserted LMA Size: 4.0 Number of attempts: 1 Placement Confirmation: positive ETCO2 Tube secured with: Tape Dental Injury: Teeth and Oropharynx as per pre-operative assessment

## 2022-04-20 NOTE — Anesthesia Postprocedure Evaluation (Signed)
Anesthesia Post Note  Patient: Anastasio Winkels  Procedure(s) Performed: IRRIGATION AND DEBRIDMENT OF LOWER LEG AND APPLICATION WOUND VAC GROIN (Right)     Patient location during evaluation: PACU Anesthesia Type: General Level of consciousness: awake and alert Pain management: pain level controlled Vital Signs Assessment: post-procedure vital signs reviewed and stable Respiratory status: spontaneous breathing, nonlabored ventilation and respiratory function stable Cardiovascular status: blood pressure returned to baseline Postop Assessment: no apparent nausea or vomiting Anesthetic complications: no   No notable events documented.  Last Vitals:  Vitals:   04/20/22 1115 04/20/22 1130  BP: (!) 149/70 (!) 160/85  Pulse: (!) 108 97  Resp: 16 14  Temp:  36.6 C  SpO2: 99% 99%    Last Pain:  Vitals:   04/20/22 1130  TempSrc:   PainSc: 0-No pain                 Shanda Howells

## 2022-04-20 NOTE — Progress Notes (Signed)
Right groin wound vac clotted off. Attempted x2 to replace vac dressing with charge nurse. Site bleeding and saturating wound vac dressing. On call MD paged and advised to place wet to dry dressing for now.

## 2022-04-20 NOTE — Transfer of Care (Signed)
Immediate Anesthesia Transfer of Care Note  Patient: Jeffrey Fitzgerald  Procedure(s) Performed: IRRIGATION AND DEBRIDMENT OF LOWER LEG AND APPLICATION WOUND VAC GROIN (Right)  Patient Location: PACU  Anesthesia Type:General  Level of Consciousness: drowsy, patient cooperative and responds to stimulation  Airway & Oxygen Therapy: Patient Spontanous Breathing  Post-op Assessment: Report given to RN and Post -op Vital signs reviewed and stable  Post vital signs: Reviewed and stable  Last Vitals:  Vitals Value Taken Time  BP 147/78 04/20/22 1100  Temp    Pulse 109 04/20/22 1100  Resp 16 04/20/22 1100  SpO2 100 % 04/20/22 1100  Vitals shown include unvalidated device data.  Last Pain:  Vitals:   04/20/22 0914  TempSrc: Oral  PainSc:       Patients Stated Pain Goal: 0 (04/15/22 0010)  Complications: No notable events documented.

## 2022-04-20 NOTE — Op Note (Signed)
    NAMETimoth Schara    MRN: [id number] DOB: [birth year]    DATE OF OPERATION: 04/20/2022  PREOP DIAGNOSIS:    Right leg fasciotomy wound infection  POSTOP DIAGNOSIS:    Same  PROCEDURE:    Right leg fasciotomy washout, soft tissue debridement Right groin washout, soft tissue debridement, VAC placement  SURGEON: Victorino Sparrow  ASSIST: None  ANESTHESIA: General  EBL: 50 mL  INDICATIONS:    Jeffrey Fitzgerald is a 51 y.o. male who has undergone significant surgical reconstruction over the last month including aortobifemoral bypass, bilateral femoral to posterior tibial artery bypasses.  Patient continues to have excellent signals in the feet, however he has had increased pain in the right leg, specifically at the fasciotomy sites.  Leukocytosis continues to increase.  After discussing the risk and benefits of right lower extremity washout, debridement, flexor elected to proceed.  FINDINGS:   Liquefied necrosis of muscle and the posterior compartments of the right leg Muscle necrosis in the anterior and lateral compartments Devitalized skin and adipose layer in the right groin.   TECHNIQUE:   Patient was brought to the OR laid in supine position.  General anesthesia was induced and patient was prepped and draped in standard fashion.    The case began by partially opening the right groin incision.  Staples and retention sutures were removed.  Serous fluid appreciated.  Poor healing noted with no tissue apposition along the incision line.  There was some bleeding appreciated level of the dermis, no bleeding in the adipose layer.  A vacuum dressing was applied an effort to promote granulation. VAC 10x4x2cm.  I then opened to the incisions along the patient's right leg medial fasciotomy.  Upon opening, there was significant hematoma and liquefied necrosis of muscle in the posterior and posterior deep compartments.  This was irrigated with copious amounts of saline.  The  anterior and lateral compartments were also evaluated demonstrating necrosis of muscle in both compartments.  I called Dr. Chestine Spore as the right leg is not salvageable.  We both agreed flexion would be best served with above-knee amputation.  I closed the right medial fasciotomy over a 36f drain with staples at the level of the skin.   Ladonna Snide, MD [facility] DATE OF DICTATION:   04/20/2022

## 2022-04-20 NOTE — Progress Notes (Signed)
[facility]  Subjective  -no complaints   Objective (!) 142/67 100 98.2 F (36.8 C) (Oral) 15 95%  Intake/Output Summary (Last 24 hours) at 04/20/2022 0810 Last data filed at 04/19/2022 1742 Gross per 24 hour  Intake 290 ml  Output 400 ml  Net -110 ml    Midline and bilateral groin incisions look okay Left leg incisions closed with no drainage Right lateral calf fasciotomy with ischemic appearing muscle Brisk PT signals bilaterally  Laboratory Lab Results: Recent Labs    04/19/22 0815 04/20/22 0322  WBC 24.9* 27.7*  HGB 8.0* 6.0*  HCT 22.0* 17.0*  PLT 176 205   BMET Recent Labs    04/19/22 0815 04/20/22 0322  NA 137 139  K 3.4* 3.6  CL 103 106  CO2 24 24  GLUCOSE 123* 119*  BUN 27* 22*  CREATININE 2.12* 2.16*  CALCIUM 7.7* 7.7*    COAG Lab Results  Component Value Date   INR 1.2 04/11/2022   INR 1.5 (H) 04/07/2022   INR 0.9 03/09/2022   No results found for: "PTT"  Assessment/Planning:  Status post aortobifem with bilateral fem PT bypasses for critical limb ischemia with tissue loss.  White count continues to rise and now 27.  On the OR schedule today for debridement and exploration of right leg fasciotomies with possible placement of myriad.  He did get blood overnight for hemoglobin of 6 that this is from oozing from his right calf fasciotomy.  Brisk PT signals bilaterally.  On heparin drip at 500 units/hr for high risk bypasses.  I have discussed with him he is high risk for limb loss of the right leg.  We really have nothing else to offer and he has brisk PT signals bilaterally.  Back on vanc Zosyn for rising white count.  Creatinine stable after stopping diuresis yesterday.  Marty Heck 04/20/2022 8:10 AM --

## 2022-04-20 NOTE — Progress Notes (Signed)
   04/20/22 0430  Provider Notification  Provider Name/Title Karin Lieu  Date Provider Notified 04/20/22  Time Provider Notified [phone]  Method of Notification Page  Notification Reason Critical result  Test performed and critical result Hgb 6.0  Date Critical Result Received 04/20/22  Time Critical Result Received 0422  Provider response See new orders  Date of Provider Response 04/20/22  Time of Provider Response [phone]

## 2022-04-20 NOTE — Progress Notes (Signed)
Pt arrived back from OR. Vitals stable. Pt oriented back to unit. Heparin infusing. Positive signal in right DP. Pt denies pain. Wound vac on right groin with minimal sanguinous drainage. Right calf JP drain present with scant sanguinous drainage.

## 2022-04-20 NOTE — Anesthesia Preprocedure Evaluation (Addendum)
Anesthesia Evaluation  Patient identified by MRN, date of birth, ID band Patient awake    Reviewed: NPO status , Patient's Chart, lab work & pertinent test results  Airway Mallampati: II       Dental   Pulmonary Current Smoker and Patient abstained from smoking.,    breath sounds clear to auscultation       Cardiovascular hypertension, + Peripheral Vascular Disease   Rhythm:Regular Rate:Normal     Neuro/Psych    GI/Hepatic Neg liver ROS,   Endo/Other    Renal/GU Renal disease     Musculoskeletal   Abdominal   Peds  Hematology   Anesthesia Other Findings   Reproductive/Obstetrics                             Anesthesia Physical Anesthesia Plan  ASA: 3  Anesthesia Plan: General   Post-op Pain Management:    Induction: Intravenous  PONV Risk Score and Plan: 2 and Ondansetron, Dexamethasone and Treatment may vary due to age or medical condition  Airway Management Planned: Oral ETT  Additional Equipment:   Intra-op Plan:   Post-operative Plan: Extubation in OR  Informed Consent: I have reviewed the patients History and Physical, chart, labs and discussed the procedure including the risks, benefits and alternatives for the proposed anesthesia with the patient or authorized representative who has indicated his/her understanding and acceptance.     Dental advisory given  Plan Discussed with: CRNA and Anesthesiologist  Anesthesia Plan Comments:        Anesthesia Quick Evaluation

## 2022-04-21 ENCOUNTER — Encounter (HOSPITAL_COMMUNITY): Payer: Self-pay | Admitting: Vascular Surgery

## 2022-04-21 DIAGNOSIS — Z515 Encounter for palliative care: Secondary | ICD-10-CM

## 2022-04-21 DIAGNOSIS — Z7189 Other specified counseling: Secondary | ICD-10-CM

## 2022-04-21 LAB — BPAM RBC
Blood Product Expiration Date: 202309012359
Blood Product Expiration Date: 202309042359
Blood Product Expiration Date: 202309082359
Blood Product Expiration Date: 202309082359
Blood Product Expiration Date: 202309122359
Blood Product Expiration Date: 202309132359
ISSUE DATE / TIME: 202308201505
ISSUE DATE / TIME: 202308201849
ISSUE DATE / TIME: 202308220449
ISSUE DATE / TIME: 202308220843
ISSUE DATE / TIME: 202308221941
ISSUE DATE / TIME: 202308221941
Unit Type and Rh: 6200
Unit Type and Rh: 6200
Unit Type and Rh: 6200
Unit Type and Rh: 6200
Unit Type and Rh: 6200
Unit Type and Rh: 6200

## 2022-04-21 LAB — TYPE AND SCREEN
ABO/RH(D): A POS
Antibody Screen: NEGATIVE
Unit division: 0
Unit division: 0
Unit division: 0
Unit division: 0
Unit division: 0
Unit division: 0

## 2022-04-21 LAB — CBC
HCT: 30.2 % — ABNORMAL LOW (ref 39.0–52.0)
Hemoglobin: 11 g/dL — ABNORMAL LOW (ref 13.0–17.0)
MCH: 29.7 pg (ref 26.0–34.0)
MCHC: 36.4 g/dL — ABNORMAL HIGH (ref 30.0–36.0)
MCV: 81.6 fL (ref 80.0–100.0)
Platelets: 176 10*3/uL (ref 150–400)
RBC: 3.7 MIL/uL — ABNORMAL LOW (ref 4.22–5.81)
RDW: 16.2 % — ABNORMAL HIGH (ref 11.5–15.5)
WBC: 24.2 10*3/uL — ABNORMAL HIGH (ref 4.0–10.5)
nRBC: 2.4 % — ABNORMAL HIGH (ref 0.0–0.2)

## 2022-04-21 LAB — BASIC METABOLIC PANEL
Anion gap: 10 (ref 5–15)
BUN: 22 mg/dL — ABNORMAL HIGH (ref 6–20)
CO2: 18 mmol/L — ABNORMAL LOW (ref 22–32)
Calcium: 7.6 mg/dL — ABNORMAL LOW (ref 8.9–10.3)
Chloride: 110 mmol/L (ref 98–111)
Creatinine, Ser: 1.74 mg/dL — ABNORMAL HIGH (ref 0.61–1.24)
GFR, Estimated: 47 mL/min — ABNORMAL LOW (ref >60)
Glucose, Bld: 135 mg/dL — ABNORMAL HIGH (ref 70–99)
Potassium: 4.2 mmol/L (ref 3.5–5.1)
Sodium: 138 mmol/L (ref 135–145)

## 2022-04-21 MED ORDER — SODIUM CHLORIDE 0.9 % IV SOLN: INTRAVENOUS | Status: DC | PRN

## 2022-04-21 MED ORDER — VANCOMYCIN HCL IN DEXTROSE 1-5 GM/200ML-% IV SOLN
1000.0000 mg | INTRAVENOUS | Status: DC
Start: 2022-04-21 — End: 2022-04-27
  Administered 2022-04-21 – 2022-04-27 (×6): 1000 mg via INTRAVENOUS
  Filled 2022-04-21 (×8): qty 200

## 2022-04-21 NOTE — H&P (View-Only) (Signed)
Progress Note    04/21/2022 8:25 AM 1 Day Post-Op  Subjective: Wants to go home   Vitals:   04/21/22 0300 04/21/22 0754  BP: (!) 154/74 (!) 158/88  Pulse: (!) 107 (!) 115  Resp: 20 16  Temp: (!) 97.5 F (36.4 C) 98.4 F (36.9 C)  SpO2: 100% 100%   Physical Exam: Lungs:  non labored Incisions: Dressing left in place right lower leg; wound VAC with good seal right groin Extremities: Cold left foot without Doppler flow; brisk right PT by Doppler Neurologic: A&O  CBC    Component Value Date/Time   WBC 24.2 (H) 04/21/2022 0346   RBC 3.70 (L) 04/21/2022 0346   HGB 11.0 (L) 04/21/2022 0346   HCT 30.2 (L) 04/21/2022 0346   PLT 176 04/21/2022 0346   MCV 81.6 04/21/2022 0346   MCH 29.7 04/21/2022 0346   MCHC 36.4 (H) 04/21/2022 0346   RDW 16.2 (H) 04/21/2022 0346   LYMPHSABS 2.0 04/09/2022 0009   MONOABS 1.7 (H) 04/09/2022 0009   EOSABS 0.0 04/09/2022 0009   BASOSABS 0.0 04/09/2022 0009    BMET    Component Value Date/Time   NA 138 04/21/2022 0346   K 4.2 04/21/2022 0346   CL 110 04/21/2022 0346   CO2 18 (L) 04/21/2022 0346   GLUCOSE 135 (H) 04/21/2022 0346   BUN 22 (H) 04/21/2022 0346   CREATININE 1.74 (H) 04/21/2022 0346   CALCIUM 7.6 (L) 04/21/2022 0346   GFRNONAA 47 (L) 04/21/2022 0346   GFRAA >60 01/12/2020 1421    INR    Component Value Date/Time   INR 1.2 04/11/2022 0437     Intake/Output Summary (Last 24 hours) at 04/21/2022 0825 Last data filed at 04/21/2022 0546 Gross per 24 hour  Intake 3313.3 ml  Output 2250 ml  Net 1063.3 ml     Assessment/Plan:  51 y.o. male is s/p bleeding from right groin requiring repair as well as muscle flap with wound VAC placement 1 Day Post-Op   Given findings yesterday during debridement of lower leg, patient will require a right above-the-knee amputation which will be performed by Dr. Lenell Antu tomorrow 04/22/2022.  He will be n.p.o. past midnight.  Consent ordered.  Patient was also taken back for bleeding from  right groin requiring repair as well as muscle flap and wound VAC placement.  Heparin was paused due to hemorrhagic bleeding.  On exam this morning it appears his left leg bypass is likely thrombosed.  No indication for revascularization at this point.  He will require left above-the-knee amputation if he develops worsening tissue loss or ischemic pain of the left leg.  We will also consult palliative care for goals of care discussion moving forward.    Emilie Rutter, PA-C [facility] [phone] 04/21/2022 8:25 AM  I have seen and evaluated the patient. I agree with the PA note as documented above.  Complicated and difficult situation.  51 year old male that underwent aortobifemoral bypass with bilateral fem-tib bypasses for critical limb ischemia with tissue loss.  He has developed a rising white count and went to the OR yesterday and most of the muscle in his right calf is necrotic and non-viable.  This is with a functioning bypass and good doppler signal in the right foot.  On the right he needs an above-knee amputation.  Last night he developed a right groin bleed and had to go urgently back to the OR and had a muscle flap with groin debridement.  In the process  his heparin was held and he has now thrombosed his left leg bypass.  He really has no more options from a vascular standpoint.  I think he is ultimately facing bilateral above-knee amputations and I discussed with his sister there is some risk that these do not heal and further risk that he has ongoing wound problems in his right groin.  They are open to considering hospice palliative care consult.  She is going to come up and talk to her brother tonight.  He is tentatively on the schedule tomorrow for right groin VAC change and a right above-knee amputation given the right leg has led to increasing leukocytosis.  He remains on broad-spectrum antibiotics.  I do not think there is any value in further thrombectomy of the  left leg bypass given he has gone to the OR on multiple occasions for this.  He has compromised outflow in both lower extremities with severe multilevel occlusive disease.  I have discussed all this with the patient.  No immediate plans for left leg amputation.  He is not complaining of any pain in this foot.  I have discussed with the sister that I do not think he has enough inflow to heal his wounds now.  Cephus Shelling, MD [facility] [place] Office: [phone]

## 2022-04-21 NOTE — Consult Note (Signed)
   Palliative Care Consult Note                                  Date: 04/21/2022   Patient Name: Jeffrey Fitzgerald  DOB: Apr 11, 1971  MRN: [id number]  Age / Sex: 51 y.o., male  PCP: Patient, No Pcp Per Referring Physician: Marty Heck, MD  Reason for Consultation: {Reason for Consult:23484}  HPI/Patient Profile: 51 y.o. male  with past medical history of *** admitted on 04/01/2022 with ***.   Past Medical History:  Diagnosis Date   Hypertension    Peripheral vascular disease ([place])    Tobacco abuse     Subjective:   I have reviewed medical records including EPIC notes, labs and imaging, received report from the team, and assessed the patient at bedside.   I met with *** to discuss diagnosis, prognosis, GOC, EOL wishes, disposition, and options.  I introduced Palliative Medicine as specialized medical care for people living with serious illness. It focuses on providing relief from the symptoms and stress of a serious illness.   We discussed patient's current illness and what it means in the larger context of his/her ongoing co-morbidities. Current clinical status was reviewed. Natural disease trajectory of *** was discussed.  Created space and opportunity for patient and family to explore thoughts and feelings regarding current medical situation.  Values and goals of care important to patient and family were attempted to be elicited.  A discussion was had today regarding advanced directives. Concepts specific to code status, artifical feeding and hydration, continued IV antibiotics and rehospitalization was had.  The MOST form was introduced and discussed.  Questions and concerns addressed. Patient/family encouraged to call with questions or concerns.     Life Review: ***  Functional Status: ***  Patient/Family Understanding of Illness: ***  Patient Values: ***  Goals: ***  Additional Discussion: ***  Review  of Systems  Objective:   Primary Diagnoses: Present on Admission:  Critical limb ischemia of both lower extremities (HCC)  PAD (peripheral artery disease) (HCC)   Physical Exam  Vital Signs:  BP (!) 158/88 (BP Location: Left Arm)   Pulse (!) 115   Temp 98.4 F (36.9 C) (Oral)   Resp 16   Ht $R'5\' 5"'am$  (1.651 m)   Wt 68.8 kg   SpO2 100%   BMI 25.24 kg/m   Palliative Assessment/Data: ***     Assessment & Plan:   SUMMARY OF RECOMMENDATIONS   ***  Primary Decision Maker: {Primary Decision YTKZS:01093}  Code Status/Advance Care Planning: {Palliative Code status:23503}  Symptom Management:  ***  Prognosis:  {Palliative Care Prognosis:23504}  Discharge Planning:  {Palliative dispostion:23505}   Discussed with: ***    Thank you for allowing [place] to participate in the care of [name]   Time Total: ***  Greater than 50%  of this time was spent counseling and coordinating care related to the above assessment and plan.  Signed by: Elie Confer, NP Palliative Medicine Team  Team Phone # [phone]  For individual providers, please see AMION

## 2022-04-21 NOTE — Anesthesia Postprocedure Evaluation (Signed)
Anesthesia Post Note  Patient: Jeffrey Fitzgerald  Procedure(s) Performed: PATCH ANGIOPLASTY USING XENOSURE BIOLOGIC (Right: Groin) RIGHT FEMORAL ARTERY THROMBECTOMY (Right: Groin)     Patient location during evaluation: PACU Anesthesia Type: General Level of consciousness: awake and alert Pain management: pain level controlled Vital Signs Assessment: post-procedure vital signs reviewed and stable Respiratory status: spontaneous breathing, nonlabored ventilation, respiratory function stable and patient connected to nasal cannula oxygen Cardiovascular status: blood pressure returned to baseline and stable Postop Assessment: no apparent nausea or vomiting Anesthetic complications: no   No notable events documented.  Last Vitals:  Vitals:   04/21/22 0300 04/21/22 0754  BP: (!) 154/74 (!) 158/88  Pulse: (!) 107 (!) 115  Resp: 20 16  Temp: (!) 36.4 C 36.9 C  SpO2: 100% 100%    Last Pain:  Vitals:   04/21/22 0754  TempSrc: Oral  PainSc:    Pain Goal: Patients Stated Pain Goal: 0 (04/15/22 0010)                 [name] S

## 2022-04-21 NOTE — Progress Notes (Signed)
Inpatient Rehab Admissions Coordinator:    CIR following, R AKA pending . Will follow up afterwards and pursue admit if appropriate.   Megan Salon, MS, CCC-SLP Rehab Admissions Coordinator  [phone] (celll) [phone] (office)

## 2022-04-21 NOTE — Progress Notes (Signed)
Occupational Therapy Treatment Patient Details Name: Jeffrey Fitzgerald MRN: [id number] DOB: 12-15-1970 Today's Date: 04/21/2022   History of present illness Pt is a 51 yo male admitted 04/01/22 from cath lab after aortogram for LLE ischemia. S/p aortobifemoral bypass graft with bilateral femoral endarterectomy on 8/9. Post op RLE ischemia with return to OR same date for redo R groin exploration with thrombectomy and angioplasty. S/p RLE fem-pop bypass graft with 4 compartment fasciotomy on 8/10. S/p thrombectomy left femoral to popliteal BPG wtih closure and right fasciotomy wtih VAC on right LE on 8/11. Ileus with emesis on 8/14. PMH includes HTN, tobacco smoking, PVD.   OT comments  Pt seen in conjunction with PT to maximize participation. Pt initially very receptive to session seeming to be eager to mobilize. Pt initiated bed mobility with education provided on use of gait belt as leg lifter for RLE as pt with difficulty lifting RLE 2/2 pain, pt needed + time and effort to maneuver RLE slightly to EOB with pt declining any help from OTA/PTA then pt adamantly stops mobility stating " I dont want to do any of this." Encouraged pt to allow therapists to assist pt so that we could work on standing from EOB to facilitate improved strength for AKA surgery tomorrow however pt then states he doesn't even want to have the surgery tomorrow. Offered emotional support and therapeutic listening however pt continues to decline further mobility. Pt did allow this OTA to guide pt in theraband therex as indicated below. DC plan remains appropriate pending that pts motivation/participation level improves post surgery. Will follow acutely for OT needs.    Recommendations for follow up therapy are one component of a multi-disciplinary discharge planning process, led by the attending physician.  Recommendations may be updated based on patient status, additional functional criteria and insurance authorization.    Follow Up  Recommendations  Acute inpatient rehab (3hours/day)    Assistance Recommended at Discharge Frequent or constant Supervision/Assistance  Patient can return home with the following  Two people to help with walking and/or transfers;A lot of help with bathing/dressing/bathroom;Direct supervision/assist for medications management;Assist for transportation;Help with stairs or ramp for entrance   Equipment Recommendations  Wheelchair (measurements OT);Wheelchair cushion (measurements OT);BSC/3in1    Recommendations for Other Services      Precautions / Restrictions Precautions Precautions: Fall;Other (comment) Precaution Comments: watch HR, bowel incontience Restrictions Weight Bearing Restrictions: No       Mobility Bed Mobility               General bed mobility comments: pt asks to complete bed mobility without hands on assist, pt initiated maneuvering RLE to EOB, step by step cues provided for sequencing with education also provided on using gait belt as leg lifter, pt seemed to be progressing well but then stops and refuses completing further mobility tasks    Transfers                   General transfer comment: pt declined     Balance Overall balance assessment:  (unable to assess d/t pt refusal)                                         ADL either performed or assessed with clinical judgement   ADL Overall ADL's : Needs assistance/impaired  General ADL Comments: limited to bed level today    Extremity/Trunk Assessment Upper Extremity Assessment Upper Extremity Assessment: Generalized weakness   Lower Extremity Assessment Lower Extremity Assessment: Defer to PT evaluation   Cervical / Trunk Assessment Cervical / Trunk Assessment: Normal    Vision Baseline Vision/History: 0 No visual deficits     Perception Perception Perception: Within Functional Limits   Praxis  Praxis Praxis: Intact    Cognition Arousal/Alertness: Awake/alert Behavior During Therapy: Flat affect Overall Cognitive Status: No family/caregiver present to determine baseline cognitive functioning Area of Impairment: Attention, Following commands, Safety/judgement, Awareness, Problem solving                   Current Attention Level: Focused   Following Commands: Follows one step commands consistently, Follows one step commands with increased time Safety/Judgement: Decreased awareness of deficits Awareness: Emergent Problem Solving: Slow processing, Decreased initiation, Difficulty sequencing, Requires verbal cues General Comments: pt initially very motivated to work with therapy, pt even declined asssitance when working on bed mobility.pt then suddenly states " I dont want to do even of this" stating he doesn't even want to have the amputation        Exercises Other Exercises Other Exercises: issued pt level 3 theraband and tied to bilateral bed rails with pt instucted on forward punches, shoulder flexion and bicep curls to increase UB strength/endurance    Shoulder Instructions       General Comments VSS on RA, wound vac in place    Pertinent Vitals/ Pain       Pain Assessment Pain Assessment: Faces Faces Pain Scale: Hurts a little bit Pain Location: R LE with movement Pain Descriptors / Indicators: Guarding, Grimacing, Discomfort Pain Intervention(s): Monitored during session, Repositioned  Home Living                                          Prior Functioning/Environment              Frequency  Min 2X/week        Progress Toward Goals  OT Goals(current goals can now be found in the care plan section)  Progress towards OT goals: Not progressing toward goals - comment (sudden refusal)  Acute Rehab OT Goals Patient Stated Goal: to not have surgery OT Goal Formulation: With patient Time For Goal Achievement:  04/25/22 Potential to Achieve Goals: Fair  Plan Discharge plan remains appropriate;Frequency remains appropriate    Co-evaluation      Reason for Co-Treatment: Complexity of the patient's impairments (multi-system involvement);To address functional/ADL transfers   OT goals addressed during session: ADL's and self-care      AM-PAC OT "6 Clicks" Daily Activity     Outcome Measure   Help from another person eating meals?: None Help from another person taking care of personal grooming?: None Help from another person toileting, which includes using toliet, bedpan, or urinal?: A Lot Help from another person bathing (including washing, rinsing, drying)?: A Lot Help from another person to put on and taking off regular upper body clothing?: A Little Help from another person to put on and taking off regular lower body clothing?: A Lot 6 Click Score: 17    End of Session    OT Visit Diagnosis: Unsteadiness on feet (R26.81);Other abnormalities of gait and mobility (R26.89);Muscle weakness (generalized) (M62.81);Other symptoms and signs involving cognitive function;Pain Pain - Right/Left: Right Pain -  part of body: Leg   Activity Tolerance Treatment limited secondary to agitation;Other (comment) (mild agitation vs frustration)   Patient Left in bed;with call bell/phone within reach;with bed alarm set   Nurse Communication          Time: 2233-6122 OT Time Calculation (min): 29 min  Charges: OT General Charges $OT Visit: 1 Visit OT Treatments $Therapeutic Activity: 8-22 mins  Lenor Derrick., COTA/L Acute Rehabilitation Services [phone]   Barron Schmid 04/21/2022, 11:54 AM

## 2022-04-21 NOTE — Progress Notes (Signed)
Pharmacy Antibiotic Note  Jeffrey Fitzgerald is a 51 y.o. male admitted on 04/01/2022 with concern for wound infection. Pt with critical limb ischemia s/p  thrombectomy of L common femoral to posterior tibial artery bypass, medial right fasciotomy closure and vac placement on lateral fasciotomy now wet to dry dressing. Pharmacy has been consulted for Zosyn and vancomycin dosing.  Pt s/p wound vac placement, likely will need amputation. Cr has improved, WBC improving as well.  Plan: Continue Zosyn 3.375g IV EI q8h Adjust vancomycin to 1000mg  IV q24h  Height: 5\' 5"  (165.1 cm) Weight: 68.8 kg (151 lb 10.8 oz) IBW/kg (Calculated) : 61.5  Temp (24hrs), Avg:98 F (36.7 C), Min:97.3 F (36.3 C), Max:99.1 F (37.3 C)  Recent Labs  Lab 04/17/22 0150 04/18/22 0325 04/19/22 0815 04/20/22 0322 04/20/22 1026 04/21/22 0346  WBC 17.8* 20.6* 24.9* 27.7*  --  24.2*  CREATININE 2.14* 1.86* 2.12* 2.16* 2.00* 1.74*     Estimated Creatinine Clearance: 44.2 mL/min (A) (by C-G formula based on SCr of 1.74 mg/dL (H)).    No Known Allergies  Antimicrobials this admission: Zosyn 8/4 >> 8/17; 8/20 >>  Vancomycin 8/4 >> 8/10; 8/21 >> Zyvox 8/10>>8/17  Dose adjustments this admission:   Microbiology results: 8/20 BCx: sent  Thank you for allowing pharmacy to be a part of this patient's care.  02-11-1997, PharmD, BCPS, [facility] Clinical Pharmacist [phone] Please check AMION for all [facility] Pharmacy numbers 04/21/2022

## 2022-04-21 NOTE — Progress Notes (Addendum)
Progress Note    04/21/2022 8:25 AM 1 Day Post-Op  Subjective: Wants to go home   Vitals:   04/21/22 0300 04/21/22 0754  BP: (!) 154/74 (!) 158/88  Pulse: (!) 107 (!) 115  Resp: 20 16  Temp: (!) 97.5 F (36.4 C) 98.4 F (36.9 C)  SpO2: 100% 100%   Physical Exam: Lungs:  non labored Incisions: Dressing left in place right lower leg; wound VAC with good seal right groin Extremities: Cold left foot without Doppler flow; brisk right PT by Doppler Neurologic: A&O  CBC    Component Value Date/Time   WBC 24.2 (H) 04/21/2022 0346   RBC 3.70 (L) 04/21/2022 0346   HGB 11.0 (L) 04/21/2022 0346   HCT 30.2 (L) 04/21/2022 0346   PLT 176 04/21/2022 0346   MCV 81.6 04/21/2022 0346   MCH 29.7 04/21/2022 0346   MCHC 36.4 (H) 04/21/2022 0346   RDW 16.2 (H) 04/21/2022 0346   LYMPHSABS 2.0 04/09/2022 0009   MONOABS 1.7 (H) 04/09/2022 0009   EOSABS 0.0 04/09/2022 0009   BASOSABS 0.0 04/09/2022 0009    BMET    Component Value Date/Time   NA 138 04/21/2022 0346   K 4.2 04/21/2022 0346   CL 110 04/21/2022 0346   CO2 18 (L) 04/21/2022 0346   GLUCOSE 135 (H) 04/21/2022 0346   BUN 22 (H) 04/21/2022 0346   CREATININE 1.74 (H) 04/21/2022 0346   CALCIUM 7.6 (L) 04/21/2022 0346   GFRNONAA 47 (L) 04/21/2022 0346   GFRAA >60 01/12/2020 1421    INR    Component Value Date/Time   INR 1.2 04/11/2022 0437     Intake/Output Summary (Last 24 hours) at 04/21/2022 0825 Last data filed at 04/21/2022 0546 Gross per 24 hour  Intake 3313.3 ml  Output 2250 ml  Net 1063.3 ml     Assessment/Plan:  51 y.o. male is s/p bleeding from right groin requiring repair as well as muscle flap with wound VAC placement 1 Day Post-Op   Given findings yesterday during debridement of lower leg, patient will require a right above-the-knee amputation which will be performed by Dr. Hawken tomorrow 04/22/2022.  He will be n.p.o. past midnight.  Consent ordered.  Patient was also taken back for bleeding from  right groin requiring repair as well as muscle flap and wound VAC placement.  Heparin was paused due to hemorrhagic bleeding.  On exam this morning it appears his left leg bypass is likely thrombosed.  No indication for revascularization at this point.  He will require left above-the-knee amputation if he develops worsening tissue loss or ischemic pain of the left leg.  We will also consult palliative care for goals of care discussion moving forward.    Matthew Eveland, PA-C [facility] [phone] 04/21/2022 8:25 AM  I have seen and evaluated the patient. I agree with the PA note as documented above.  Complicated and difficult situation.  51-year-old male that underwent aortobifemoral bypass with bilateral fem-tib bypasses for critical limb ischemia with tissue loss.  He has developed a rising white count and went to the OR yesterday and most of the muscle in his right calf is necrotic and non-viable.  This is with a functioning bypass and good doppler signal in the right foot.  On the right he needs an above-knee amputation.  Last night he developed a right groin bleed and had to go urgently back to the OR and had a muscle flap with groin debridement.  In the process   his heparin was held and he has now thrombosed his left leg bypass.  He really has no more options from a vascular standpoint.  I think he is ultimately facing bilateral above-knee amputations and I discussed with his sister there is some risk that these do not heal and further risk that he has ongoing wound problems in his right groin.  They are open to considering hospice palliative care consult.  She is going to come up and talk to her brother tonight.  He is tentatively on the schedule tomorrow for right groin VAC change and a right above-knee amputation given the right leg has led to increasing leukocytosis.  He remains on broad-spectrum antibiotics.  I do not think there is any value in further thrombectomy of the  left leg bypass given he has gone to the OR on multiple occasions for this.  He has compromised outflow in both lower extremities with severe multilevel occlusive disease.  I have discussed all this with the patient.  No immediate plans for left leg amputation.  He is not complaining of any pain in this foot.  I have discussed with the sister that I do not think he has enough inflow to heal his wounds now.  Cephus Shelling, MD [facility] [place] Office: [phone]

## 2022-04-21 NOTE — Progress Notes (Signed)
Physical Therapy Treatment Patient Details Name: Jeffrey Fitzgerald MRN: [id number] DOB: [birth year] Today's Date: 04/21/2022   History of Present Illness Pt is a 51 yo male admitted 04/01/22 from cath lab after aortogram for LLE ischemia. S/p aortobifemoral bypass graft with bilateral femoral endarterectomy on 8/9. Post op RLE ischemia with return to OR same date for redo R groin exploration with thrombectomy and angioplasty. S/p RLE fem-pop bypass graft with 4 compartment fasciotomy on 8/10. S/p thrombectomy left femoral to popliteal BPG wtih closure and right fasciotomy wtih VAC on right LE on 8/11. Ileus with emesis on 8/14. PMH includes HTN, tobacco smoking, PVD.    PT Comments    Pt seen for PT/OT session for maximum pt participation, with pt initially eager to mobilize and agreeable to session, however after initiating bed mobility pt abruptly stating "I don't want to do any of this". Pt able to initiate RLE to EOB with use of gait belt and sheet as limb mover; encouraged pt to allow PTA or OTA to assist for progression to OOB and standing trials for increased LLE strength prior to planned AKA, however pt continuing to decline assist and stating he does not want to have surgery tomorrow. Offered emotional support and therapeutic listening as well as guided pt in LE therex for increased strength of LLE. Pt continues to benefit from skilled PT services to progress toward functional mobility goals.    Recommendations for follow up therapy are one component of a multi-disciplinary discharge planning process, led by the attending physician.  Recommendations may be updated based on patient status, additional functional criteria and insurance authorization.  Follow Up Recommendations  Acute inpatient rehab (3hours/day) Can patient physically be transported by private vehicle: No   Assistance Recommended at Discharge Frequent or constant Supervision/Assistance  Patient can return home with the  following A lot of help with bathing/dressing/bathroom;Assist for transportation;Direct supervision/assist for financial management;Assistance with cooking/housework;Help with stairs or ramp for entrance;Direct supervision/assist for medications management;Two people to help with walking and/or transfers   Equipment Recommendations  Rolling walker (2 wheels);BSC/3in1    Recommendations for Other Services       Precautions / Restrictions Precautions Precautions: Fall;Other (comment) Precaution Comments: watch HR, bowel incontience Restrictions Weight Bearing Restrictions: No     Mobility  Bed Mobility Overal bed mobility: Needs Assistance Bed Mobility: Supine to Sit           General bed mobility comments: pt asks to complete bed mobility without hands on assist, pt initiated maneuvering RLE to EOB, step by step cues provided for sequencing with education also provided on using gait belt as leg lifter, pt seemed to be progressing well but then stops and refuses completing further mobility tasks    Transfers                   General transfer comment: pt declined    Ambulation/Gait                   Stairs             Wheelchair Mobility    Modified Rankin (Stroke Patients Only)       Balance                                            Cognition Arousal/Alertness: Awake/alert Behavior During Therapy: Flat affect Overall Cognitive Status: No  family/caregiver present to determine baseline cognitive functioning Area of Impairment: Attention, Following commands, Safety/judgement, Awareness, Problem solving                   Current Attention Level: Focused   Following Commands: Follows one step commands consistently, Follows one step commands with increased time Safety/Judgement: Decreased awareness of deficits Awareness: Emergent Problem Solving: Slow processing, Decreased initiation, Difficulty sequencing, Requires  verbal cues General Comments: pt initially very motivated to work with therapy, pt even declined asssitance when working on bed mobility.pt then suddenly states " I dont want to do any of this" stating he doesn't even want to have the amputation        Exercises General Exercises - Lower Extremity Quad Sets: AROM, Left, 5 reps, Supine Other Exercises Other Exercises: pt declinging further LE therex    General Comments General comments (skin integrity, edema, etc.): VSS on RA, wound vac in place      Pertinent Vitals/Pain Pain Assessment Pain Assessment: Faces Faces Pain Scale: Hurts a little bit Pain Location: R LE with movement Pain Descriptors / Indicators: Guarding, Grimacing, Discomfort Pain Intervention(s): Monitored during session, Limited activity within patient's tolerance    Home Living                          Prior Function            PT Goals (current goals can now be found in the care plan section) Acute Rehab PT Goals PT Goal Formulation: With patient Time For Goal Achievement: 04/22/22    Frequency    Min 3X/week      PT Plan Current plan remains appropriate    Co-evaluation PT/OT/SLP Co-Evaluation/Treatment: Yes Reason for Co-Treatment: Complexity of the patient's impairments (multi-system involvement) PT goals addressed during session: Mobility/safety with mobility;Strengthening/ROM OT goals addressed during session: ADL's and self-care      AM-PAC PT "6 Clicks" Mobility   Outcome Measure  Help needed turning from your back to your side while in a flat bed without using bedrails?: A Little Help needed moving from lying on your back to sitting on the side of a flat bed without using bedrails?: A Lot Help needed moving to and from a bed to a chair (including a wheelchair)?: Total Help needed standing up from a chair using your arms (e.g., wheelchair or bedside chair)?: Total Help needed to walk in hospital room?: Total Help needed  climbing 3-5 steps with a railing? : Total 6 Click Score: 9    End of Session   Activity Tolerance: Patient limited by fatigue Patient left: in bed;with call bell/phone within reach;with bed alarm set Nurse Communication: Mobility status PT Visit Diagnosis: Other abnormalities of gait and mobility (R26.89);Difficulty in walking, not elsewhere classified (R26.2);Muscle weakness (generalized) (M62.81)     Time: 1025-8527 PT Time Calculation (min) (ACUTE ONLY): 29 min  Charges:  $Therapeutic Activity: 8-22 mins                    Cia Garretson R. PTA Acute Rehabilitation Services Office: [phone]   Catalina Antigua 04/21/2022, 1:53 PM

## 2022-04-22 ENCOUNTER — Other Ambulatory Visit: Payer: Self-pay

## 2022-04-22 ENCOUNTER — Inpatient Hospital Stay (HOSPITAL_COMMUNITY): Payer: Medicaid Other | Admitting: Anesthesiology

## 2022-04-22 ENCOUNTER — Encounter (HOSPITAL_COMMUNITY): Payer: Self-pay | Admitting: Vascular Surgery

## 2022-04-22 ENCOUNTER — Encounter (HOSPITAL_COMMUNITY)
Admission: RE | Disposition: A | Discharge status: Home Health | Payer: Self-pay | Source: Home / Self Care | Attending: Vascular Surgery

## 2022-04-22 DIAGNOSIS — N189 Chronic kidney disease, unspecified: Secondary | ICD-10-CM

## 2022-04-22 DIAGNOSIS — F17210 Nicotine dependence, cigarettes, uncomplicated: Secondary | ICD-10-CM

## 2022-04-22 DIAGNOSIS — I70221 Atherosclerosis of native arteries of extremities with rest pain, right leg: Secondary | ICD-10-CM

## 2022-04-22 DIAGNOSIS — I130 Hypertensive heart and chronic kidney disease with heart failure and stage 1 through stage 4 chronic kidney disease, or unspecified chronic kidney disease: Secondary | ICD-10-CM

## 2022-04-22 DIAGNOSIS — I509 Heart failure, unspecified: Secondary | ICD-10-CM

## 2022-04-22 HISTORY — PX: AMPUTATION: SHX166

## 2022-04-22 LAB — BASIC METABOLIC PANEL
Anion gap: 7 (ref 5–15)
BUN: 20 mg/dL (ref 6–20)
CO2: 22 mmol/L (ref 22–32)
Calcium: 7.7 mg/dL — ABNORMAL LOW (ref 8.9–10.3)
Chloride: 109 mmol/L (ref 98–111)
Creatinine, Ser: 1.73 mg/dL — ABNORMAL HIGH (ref 0.61–1.24)
GFR, Estimated: 47 mL/min — ABNORMAL LOW (ref >60)
Glucose, Bld: 106 mg/dL — ABNORMAL HIGH (ref 70–99)
Potassium: 3.5 mmol/L (ref 3.5–5.1)
Sodium: 138 mmol/L (ref 135–145)

## 2022-04-22 LAB — CBC
HCT: 30.6 % — ABNORMAL LOW (ref 39.0–52.0)
Hemoglobin: 10.6 g/dL — ABNORMAL LOW (ref 13.0–17.0)
MCH: 29.3 pg (ref 26.0–34.0)
MCHC: 34.6 g/dL (ref 30.0–36.0)
MCV: 84.5 fL (ref 80.0–100.0)
Platelets: 232 10*3/uL (ref 150–400)
RBC: 3.62 MIL/uL — ABNORMAL LOW (ref 4.22–5.81)
RDW: 16.7 % — ABNORMAL HIGH (ref 11.5–15.5)
WBC: 18.9 10*3/uL — ABNORMAL HIGH (ref 4.0–10.5)
nRBC: 1.9 % — ABNORMAL HIGH (ref 0.0–0.2)

## 2022-04-22 SURGERY — AMPUTATION, ABOVE KNEE
Anesthesia: General | Laterality: Right

## 2022-04-22 MED ORDER — HYDROMORPHONE HCL 1 MG/ML IJ SOLN
INTRAMUSCULAR | Status: AC
  Filled 2022-04-22: qty 0.5

## 2022-04-22 MED ORDER — KETAMINE HCL 50 MG/5ML IJ SOSY
PREFILLED_SYRINGE | INTRAMUSCULAR | Status: AC
  Filled 2022-04-22: qty 5

## 2022-04-22 MED ORDER — PROPOFOL 10 MG/ML IV BOLUS
INTRAVENOUS | Status: AC
  Filled 2022-04-22: qty 20

## 2022-04-22 MED ORDER — BUPIVACAINE HCL (PF) 0.5 % IJ SOLN
INTRAMUSCULAR | Status: AC
Start: 2022-04-22 — End: ?
  Filled 2022-04-22: qty 30

## 2022-04-22 MED ORDER — CHLORHEXIDINE GLUCONATE 0.12 % MT SOLN
OROMUCOSAL | Status: AC
  Administered 2022-04-22: 15 mL via OROMUCOSAL
  Filled 2022-04-22: qty 15

## 2022-04-22 MED ORDER — ACETAMINOPHEN 500 MG PO TABS: 1000.0000 mg | ORAL_TABLET | Freq: Once | ORAL | Status: DC

## 2022-04-22 MED ORDER — OXYCODONE HCL 5 MG PO TABS: 5.0000 mg | ORAL_TABLET | Freq: Once | ORAL | Status: DC | PRN

## 2022-04-22 MED ORDER — PHENYLEPHRINE 80 MCG/ML (10ML) SYRINGE FOR IV PUSH (FOR BLOOD PRESSURE SUPPORT)
PREFILLED_SYRINGE | INTRAVENOUS | Status: AC
  Filled 2022-04-22: qty 20

## 2022-04-22 MED ORDER — OXYCODONE HCL 5 MG/5ML PO SOLN: 5.0000 mg | Freq: Once | ORAL | Status: DC | PRN

## 2022-04-22 MED ORDER — LACTATED RINGERS IV SOLN: INTRAVENOUS | Status: DC

## 2022-04-22 MED ORDER — MIDAZOLAM HCL 2 MG/2ML IJ SOLN
INTRAMUSCULAR | Status: AC
Start: 2022-04-22 — End: ?
  Filled 2022-04-22: qty 2

## 2022-04-22 MED ORDER — SODIUM CHLORIDE FLUSH 0.9 % IV SOLN
INTRAVENOUS | Status: DC | PRN
  Administered 2022-04-22: 100 mL

## 2022-04-22 MED ORDER — HYDROMORPHONE HCL 1 MG/ML IJ SOLN
INTRAMUSCULAR | Status: DC | PRN
  Administered 2022-04-22: .5 mg via INTRAVENOUS

## 2022-04-22 MED ORDER — DEXAMETHASONE SODIUM PHOSPHATE 10 MG/ML IJ SOLN
INTRAMUSCULAR | Status: AC
Start: 2022-04-22 — End: ?
  Filled 2022-04-22: qty 2

## 2022-04-22 MED ORDER — ESMOLOL HCL 100 MG/10ML IV SOLN
INTRAVENOUS | Status: AC
  Filled 2022-04-22: qty 10

## 2022-04-22 MED ORDER — PHENYLEPHRINE 80 MCG/ML (10ML) SYRINGE FOR IV PUSH (FOR BLOOD PRESSURE SUPPORT)
PREFILLED_SYRINGE | INTRAVENOUS | Status: DC | PRN
  Administered 2022-04-22 (×4): 80 ug via INTRAVENOUS
  Administered 2022-04-22: 160 ug via INTRAVENOUS

## 2022-04-22 MED ORDER — CEFAZOLIN SODIUM 1 G IJ SOLR
INTRAMUSCULAR | Status: AC
  Filled 2022-04-22: qty 20

## 2022-04-22 MED ORDER — AMISULPRIDE (ANTIEMETIC) 5 MG/2ML IV SOLN: 10.0000 mg | Freq: Once | INTRAVENOUS | Status: DC | PRN

## 2022-04-22 MED ORDER — LIDOCAINE 2% (20 MG/ML) 5 ML SYRINGE
INTRAMUSCULAR | Status: DC | PRN
  Administered 2022-04-22: 60 mg via INTRAVENOUS

## 2022-04-22 MED ORDER — ONDANSETRON HCL 4 MG/2ML IJ SOLN: 4.0000 mg | Freq: Once | INTRAMUSCULAR | Status: DC | PRN

## 2022-04-22 MED ORDER — ACETAMINOPHEN 10 MG/ML IV SOLN
INTRAVENOUS | Status: DC | PRN
  Administered 2022-04-22: 1000 mg via INTRAVENOUS

## 2022-04-22 MED ORDER — FENTANYL CITRATE (PF) 250 MCG/5ML IJ SOLN
INTRAMUSCULAR | Status: AC
  Filled 2022-04-22: qty 5

## 2022-04-22 MED ORDER — PHENYLEPHRINE HCL-NACL 20-0.9 MG/250ML-% IV SOLN
INTRAVENOUS | Status: DC | PRN
  Administered 2022-04-22: 40 ug/min via INTRAVENOUS

## 2022-04-22 MED ORDER — LIDOCAINE 2% (20 MG/ML) 5 ML SYRINGE
INTRAMUSCULAR | Status: AC
  Filled 2022-04-22: qty 10

## 2022-04-22 MED ORDER — MIDAZOLAM HCL 2 MG/2ML IJ SOLN
INTRAMUSCULAR | Status: DC | PRN
  Administered 2022-04-22: 2 mg via INTRAVENOUS

## 2022-04-22 MED ORDER — FENTANYL CITRATE (PF) 250 MCG/5ML IJ SOLN
INTRAMUSCULAR | Status: DC | PRN
  Administered 2022-04-22 (×3): 25 ug via INTRAVENOUS
  Administered 2022-04-22: 50 ug via INTRAVENOUS
  Administered 2022-04-22 (×2): 25 ug via INTRAVENOUS
  Administered 2022-04-22: 50 ug via INTRAVENOUS
  Administered 2022-04-22: 25 ug via INTRAVENOUS

## 2022-04-22 MED ORDER — BACITRACIN ZINC 500 UNIT/GM EX OINT
TOPICAL_OINTMENT | CUTANEOUS | Status: DC | PRN
  Administered 2022-04-22: 1 via TOPICAL

## 2022-04-22 MED ORDER — ONDANSETRON HCL 4 MG/2ML IJ SOLN
INTRAMUSCULAR | Status: AC
Start: 2022-04-22 — End: ?
  Filled 2022-04-22: qty 4

## 2022-04-22 MED ORDER — ROCURONIUM BROMIDE 10 MG/ML (PF) SYRINGE
PREFILLED_SYRINGE | INTRAVENOUS | Status: AC
Start: 2022-04-22 — End: ?
  Filled 2022-04-22: qty 10

## 2022-04-22 MED ORDER — ORAL CARE MOUTH RINSE: 15.0000 mL | Freq: Once | OROMUCOSAL | Status: AC

## 2022-04-22 MED ORDER — DEXAMETHASONE SODIUM PHOSPHATE 10 MG/ML IJ SOLN
INTRAMUSCULAR | Status: DC | PRN
  Administered 2022-04-22: 5 mg via INTRAVENOUS

## 2022-04-22 MED ORDER — BUPIVACAINE LIPOSOME 1.3 % IJ SUSP
INTRAMUSCULAR | Status: AC
Start: 2022-04-22 — End: ?
  Filled 2022-04-22: qty 20

## 2022-04-22 MED ORDER — PROPOFOL 10 MG/ML IV BOLUS
INTRAVENOUS | Status: DC | PRN
  Administered 2022-04-22: 200 mg via INTRAVENOUS

## 2022-04-22 MED ORDER — ESMOLOL HCL 100 MG/10ML IV SOLN
INTRAVENOUS | Status: DC | PRN
  Administered 2022-04-22: 30 mg via INTRAVENOUS

## 2022-04-22 MED ORDER — CHLORHEXIDINE GLUCONATE 0.12 % MT SOLN
15.0000 mL | Freq: Once | OROMUCOSAL | Status: AC
Start: 2022-04-22 — End: 2022-04-22

## 2022-04-22 MED ORDER — KETAMINE HCL 10 MG/ML IJ SOLN
INTRAMUSCULAR | Status: DC | PRN
  Administered 2022-04-22 (×2): 20 mg via INTRAVENOUS

## 2022-04-22 MED ORDER — ACETAMINOPHEN 10 MG/ML IV SOLN
INTRAVENOUS | Status: AC
Start: 2022-04-22 — End: ?
  Filled 2022-04-22: qty 100

## 2022-04-22 MED ORDER — 0.9 % SODIUM CHLORIDE (POUR BTL) OPTIME
TOPICAL | Status: DC | PRN
  Administered 2022-04-22: 1000 mL

## 2022-04-22 MED ORDER — ONDANSETRON HCL 4 MG/2ML IJ SOLN
INTRAMUSCULAR | Status: DC | PRN
  Administered 2022-04-22: 4 mg via INTRAVENOUS

## 2022-04-22 MED ORDER — HYDROMORPHONE HCL 1 MG/ML IJ SOLN: 0.2500 mg | INTRAMUSCULAR | Status: DC | PRN

## 2022-04-22 SURGICAL SUPPLY — 65 items
BAG COUNTER SPONGE SURGICOUNT (BAG) ×1 IMPLANT
BANDAGE ELASTIC 4 VELCRO NS (GAUZE/BANDAGES/DRESSINGS) IMPLANT
BANDAGE ELASTIC 6 VELCRO NS (GAUZE/BANDAGES/DRESSINGS) IMPLANT
BLADE SAW GIGLI 510 (BLADE) ×1 IMPLANT
BNDG COHESIVE 6X5 TAN ST LF (GAUZE/BANDAGES/DRESSINGS) IMPLANT
BNDG COHESIVE 6X5 TAN STRL LF (GAUZE/BANDAGES/DRESSINGS) ×1 IMPLANT
BNDG ELASTIC 4X5.8 VLCR STR LF (GAUZE/BANDAGES/DRESSINGS) ×1 IMPLANT
BNDG ELASTIC 6X5.8 VLCR STR LF (GAUZE/BANDAGES/DRESSINGS) ×1 IMPLANT
BNDG GAUZE ELAST 4 BULKY (GAUZE/BANDAGES/DRESSINGS) ×2 IMPLANT
CANISTER SUCT 3000ML PPV (MISCELLANEOUS) ×1 IMPLANT
CLIP VESOCCLUDE MED 6/CT (CLIP) ×1 IMPLANT
CNTNR URN SCR LID CUP LEK RST (MISCELLANEOUS) ×1 IMPLANT
CONT SPEC 4OZ STRL OR WHT (MISCELLANEOUS) ×1
COVER SURGICAL LIGHT HANDLE (MISCELLANEOUS) ×1 IMPLANT
DRAIN CHANNEL 19F RND (DRAIN) IMPLANT
DRAPE DERMATAC (DRAPES) IMPLANT
DRAPE HALF SHEET 40X57 (DRAPES) ×1 IMPLANT
DRAPE INCISE IOBAN 66X45 STRL (DRAPES) IMPLANT
DRAPE ORTHO SPLIT 77X108 STRL (DRAPES) ×2
DRAPE SURG ORHT 6 SPLT 77X108 (DRAPES) ×2 IMPLANT
DRESSING PREVENA PLUS CUSTOM (GAUZE/BANDAGES/DRESSINGS) IMPLANT
DRSG ADAPTIC 3X8 NADH LF (GAUZE/BANDAGES/DRESSINGS) ×1 IMPLANT
DRSG PREVENA PLUS CUSTOM (GAUZE/BANDAGES/DRESSINGS)
DRSG VAC ATS MED SENSATRAC (GAUZE/BANDAGES/DRESSINGS) IMPLANT
ELECT CAUTERY BLADE 6.4 (BLADE) ×1 IMPLANT
ELECT REM PT RETURN 9FT ADLT (ELECTROSURGICAL) ×1
ELECTRODE REM PT RTRN 9FT ADLT (ELECTROSURGICAL) ×1 IMPLANT
EVACUATOR SILICONE 100CC (DRAIN) IMPLANT
GAUZE 4X4 16PLY ~~LOC~~+RFID DBL (SPONGE) ×1 IMPLANT
GAUZE SPONGE 4X4 12PLY STRL (GAUZE/BANDAGES/DRESSINGS) ×2 IMPLANT
GLOVE BIOGEL PI IND STRL 6.5 (GLOVE) IMPLANT
GLOVE BIOGEL PI IND STRL 7.0 (GLOVE) IMPLANT
GLOVE BIOGEL PI INDICATOR 6.5 (GLOVE) ×1
GLOVE BIOGEL PI INDICATOR 7.0 (GLOVE) ×1
GLOVE SURG SS PI 7.5 STRL IVOR (GLOVE) ×3 IMPLANT
GOWN STRL REUS W/ TWL LRG LVL3 (GOWN DISPOSABLE) ×2 IMPLANT
GOWN STRL REUS W/ TWL XL LVL3 (GOWN DISPOSABLE) ×1 IMPLANT
GOWN STRL REUS W/TWL LRG LVL3 (GOWN DISPOSABLE) ×2
GOWN STRL REUS W/TWL XL LVL3 (GOWN DISPOSABLE) ×1
KIT BASIN OR (CUSTOM PROCEDURE TRAY) ×1 IMPLANT
KIT TURNOVER KIT B (KITS) ×1 IMPLANT
NDL HYPO 25GX1X1/2 BEV (NEEDLE) ×1 IMPLANT
NDL SPNL 18GX3.5 QUINCKE PK (NEEDLE) IMPLANT
NEEDLE HYPO 25GX1X1/2 BEV (NEEDLE) ×1 IMPLANT
NEEDLE SPNL 18GX3.5 QUINCKE PK (NEEDLE) ×1 IMPLANT
NS IRRIG 1000ML POUR BTL (IV SOLUTION) ×1 IMPLANT
PACK GENERAL/GYN (CUSTOM PROCEDURE TRAY) ×1 IMPLANT
PAD ARMBOARD 7.5X6 YLW CONV (MISCELLANEOUS) ×2 IMPLANT
POWDER MYRIAD MORCELLS 1000MG (Miscellaneous) IMPLANT
PREVENA RESTOR ARTHOFORM 46X30 (CANNISTER) IMPLANT
STAPLER VISISTAT 35W (STAPLE) ×1 IMPLANT
STOCKINETTE IMPERVIOUS LG (DRAPES) ×1 IMPLANT
SUT ETHILON 3 0 PS 1 (SUTURE) IMPLANT
SUT PROLENE 5 0 C 1 24 (SUTURE) IMPLANT
SUT SILK 0 TIES 10X30 (SUTURE) ×1 IMPLANT
SUT SILK 2 0 (SUTURE)
SUT SILK 2-0 18XBRD TIE 12 (SUTURE) IMPLANT
SUT SILK 3 0 (SUTURE)
SUT SILK 3-0 18XBRD TIE 12 (SUTURE) IMPLANT
SUT VIC AB 2-0 CT1 18 (SUTURE) ×2 IMPLANT
SYR CONTROL 10ML LL (SYRINGE) ×1 IMPLANT
TOWEL GREEN STERILE (TOWEL DISPOSABLE) ×1 IMPLANT
TUBING BULK SUCTION (MISCELLANEOUS) IMPLANT
UNDERPAD 30X36 HEAVY ABSORB (UNDERPADS AND DIAPERS) ×1 IMPLANT
WATER STERILE IRR 1000ML POUR (IV SOLUTION) ×1 IMPLANT

## 2022-04-22 NOTE — Progress Notes (Signed)
I have spoken with the patient this morning and his Sister [name].  They are both agreeable to right above-knee amputation and right groin wound VAC change today.  The right leg bypass remains patent but muscle in right leg is not viable.  They understand that still has risk of nonhealing amputation as well as ongoing wound problems in his groin that could lead to further bleeding events.  Still would appreciate palliative care today to establish goals of care moving forward.  Cephus Shelling, MD [facility] [place] Office: [phone]   Cephus Shelling

## 2022-04-22 NOTE — Op Note (Signed)
    Patient name: Jeffrey Fitzgerald MRN: [id number] DOB: 02/09/1971 Sex: male  04/22/2022 Pre-operative Diagnosis: Ischemic right leg Post-operative diagnosis:  Same Surgeon:  Annamarie Major Assistants:  Laurence Slate Procedure:   #1: Right above-knee amputation   #2: Washout of right femoral incision with placement of myriad powder and exchange of wound VAC (12 x 9 x 2 cm) Anesthesia:  General Blood Loss:  minimal Specimens:  right leg  Findings: Viable muscle at the amputation site.  The Gore-Tex graft was removed from the right leg.  It was ligated in the groin leaving approximately 1-1/2 cm of Gore-Tex.  The muscle flap was situated over the graft with no active bleeding.  Myriad powder was placed in the wound, and the wound VAC was replaced.  Indications: This is a 51 year old gentleman who has previously undergone an aortobifemoral bypass graft and bilateral tibial bypasses.  He has developed necrotic muscle in the right leg with a nonviable extremity.  He also recently had a bleed from the groin requiring exploration and muscle flap.  He comes in today for wound VAC change and amputation.  Procedure:  The patient was identified in the holding area and taken to [place] [address]  The patient was then placed supine on the table. general anesthesia was administered.  The patient was prepped and draped in the usual sterile fashion.  A time out was called and antibiotics were administered.  A PA was necessary to expedite the procedure and assist with technical details.  A fishmouth incision was made just proximal to the patella on the right.  Cautery was used divide subcutaneous tissue down to the fascia which was opened with cautery.  I then circumferentially exposed the femur.  A Gigli saw was used to transect the femur, beveling the anterior surface.  The remaining muscle was then divided with cautery.  The neurovascular bundle was then divided between clamps.  The leg was removed as a specimen.  I  then individually exposed the neurovascular bundle.  The nerve was infiltrated with Exparel and ligated proximal to the cut edge of the femur.  I then ligated the artery and vein similarly with a 2-0 silk tie.  I went back into the groin and ligated the Gore-Tex graft approximately 1 cm distal to its anastomosis to the dacryon aortobifemoral graft.  This was done with a 0 silk tie and 5-0 Prolene.  The Gore-Tex graft was then removed in its entirety.  Exparel was then infiltrated throughout the wound.  The amputation was then irrigated.  The fascia was reapproximated with 2-0 Vicryl and the skin was closed with staples.  The PA assisted with closure.  I then washed out the groin and placed myriad powder followed by a wound VAC.  There were no immediate complications.   Disposition: To PACU stable.   Theotis Burrow, M.D., [facility] [place] Office: [phone] Pager:  [phone]

## 2022-04-22 NOTE — Anesthesia Postprocedure Evaluation (Signed)
Anesthesia Post Note  Patient: Jeffrey Fitzgerald  Procedure(s) Performed: RIGHT ABOVE KNEE AMPUTATION WITH WOUND VAC SPONGE CHANGEOUT TO RIGHT GROIN (Right)     Patient location during evaluation: PACU Anesthesia Type: General Level of consciousness: awake and alert, patient cooperative and oriented Pain management: pain level controlled Vital Signs Assessment: post-procedure vital signs reviewed and stable Respiratory status: spontaneous breathing, nonlabored ventilation, respiratory function stable and patient connected to nasal cannula oxygen Cardiovascular status: blood pressure returned to baseline and stable Postop Assessment: no apparent nausea or vomiting Anesthetic complications: no   No notable events documented.  Last Vitals:  Vitals:   04/22/22 1200 04/22/22 1215  BP: (!) 141/93 (!) 165/95  Pulse: 100 92  Resp: (!) 8 (!) 8  Temp:  36.5 C  SpO2: 98% 98%    Last Pain:  Vitals:   04/22/22 1215  TempSrc:   PainSc: Asleep                 Bearett Porcaro,E. Era Parr

## 2022-04-22 NOTE — Progress Notes (Signed)
OT Cancellation Note  Patient Details Name: Toribio Seiber MRN: [id number] DOB: [birth year]   Cancelled Treatment:    Reason Eval/Treat Not Completed: Patient at procedure or test/ unavailable. Will follow and see after procedure.   Barry Brunner, OT Acute Rehabilitation Services Office [phone]   Chancy Milroy 04/22/2022, 8:35 AM

## 2022-04-22 NOTE — Anesthesia Preprocedure Evaluation (Addendum)
Anesthesia Evaluation  Patient identified by MRN, date of birth, ID band Patient awake    Reviewed: Allergy & Precautions, NPO status , Patient's Chart, lab work & pertinent test results  Airway Mallampati: IV  TM Distance: >3 FB Neck ROM: Full    Dental no notable dental hx.    Pulmonary Current Smoker and Patient abstained from smoking.,  Current smoker, 15 pack year history    Pulmonary exam normal        Cardiovascular hypertension (178/90 in preop, no home meds), + Peripheral Vascular Disease and +CHF (grade 1 diastolic dysfunction)   Rhythm:Regular Rate:Tachycardia  Echo 03/2022 1. Left ventricular ejection fraction, by estimation, is 60 to 65%. The  left ventricle has normal function. The left ventricle demonstrates  regional wall motion abnormalities with basal inferior hypokinesis. There  is mild left ventricular hypertrophy.  Left ventricular diastolic parameters are consistent with Grade I  diastolic dysfunction (impaired relaxation).  2. Right ventricular systolic function is normal. The right ventricular  size is normal. Tricuspid regurgitation signal is inadequate for assessing  PA pressure.  3. The aortic valve is tricuspid. Aortic valve regurgitation is not  visualized. No aortic stenosis is present.  4. The mitral valve is normal in structure. No evidence of mitral valve  regurgitation. No evidence of mitral stenosis.  5. The inferior vena cava is normal in size with greater than 50%  respiratory variability, suggesting right atrial pressure of 3 mmHg.    Neuro/Psych negative neurological ROS  negative psych ROS   GI/Hepatic negative GI ROS, (+)     substance abuse  marijuana use,   Endo/Other  negative endocrine ROS  Renal/GU Renal Insufficiency and CRFRenal diseaseCr 1.73     Musculoskeletal negative musculoskeletal ROS (+)   Abdominal   Peds  Hematology  (+) Blood dyscrasia, anemia , Hb  10.6, plt 232   Anesthesia Other Findings Critical limb ischemia with tissue loss  Reproductive/Obstetrics                            Anesthesia Physical Anesthesia Plan  ASA: 3  Anesthesia Plan: General   Post-op Pain Management: Tylenol PO (pre-op)*, Dilaudid IV and Ketamine IV*   Induction: Intravenous  PONV Risk Score and Plan: Ondansetron, Dexamethasone, Midazolam and Treatment may vary due to age or medical condition  Airway Management Planned: LMA  Additional Equipment:   Intra-op Plan:   Post-operative Plan: Extubation in OR  Informed Consent: I have reviewed the patients History and Physical, chart, labs and discussed the procedure including the risks, benefits and alternatives for the proposed anesthesia with the patient or authorized representative who has indicated his/her understanding and acceptance.     Dental advisory given  Plan Discussed with: CRNA  Anesthesia Plan Comments:        Anesthesia Quick Evaluation

## 2022-04-22 NOTE — Anesthesia Procedure Notes (Signed)
Procedure Name: LMA Insertion Date/Time: 04/22/2022 10:02 AM  Performed by: Garfield Cornea, CRNAPre-anesthesia Checklist: Patient identified, Emergency Drugs available, Suction available and Patient being monitored Patient Re-evaluated:Patient Re-evaluated prior to induction Oxygen Delivery Method: Circle System Utilized Preoxygenation: Pre-oxygenation with 100% oxygen Induction Type: IV induction LMA: LMA inserted LMA Size: 4.0 Number of attempts: 1 Placement Confirmation: positive ETCO2 Tube secured with: Tape Dental Injury: Teeth and Oropharynx as per pre-operative assessment

## 2022-04-22 NOTE — Interval H&P Note (Signed)
History and Physical Interval Note:  04/22/2022 8:08 AM  Jeffrey Fitzgerald  has presented today for surgery, with the diagnosis of Critical limb ischemia with tissue loss.  The various methods of treatment have been discussed with the patient and family. After consideration of risks, benefits and other options for treatment, the patient has consented to  Procedure(s): RIGHT ABOVE KNEE AMPUTATION (Right) as a surgical intervention.  The patient's history has been reviewed, patient examined, no change in status, stable for surgery.  I have reviewed the patient's chart and labs.  Questions were answered to the patient's satisfaction.     Durene Cal

## 2022-04-22 NOTE — Anesthesia Postprocedure Evaluation (Signed)
Anesthesia Post Note  Patient: Jeffrey Fitzgerald  Procedure(s) Performed: EXPLORATION OF RIGHT GROIN (Right: Groin) DEBRIDEMENT WOUND, repaired anstamosis femoral artery and creation of sartorious flap. (Right: Groin) APPLICATION OF WOUND VAC (Right: Groin)     Patient location during evaluation: PACU Anesthesia Type: General Pain management: pain level controlled Vital Signs Assessment: post-procedure vital signs reviewed and stable Respiratory status: spontaneous breathing Cardiovascular status: stable Postop Assessment: no apparent nausea or vomiting Anesthetic complications: no   No notable events documented.  Last Vitals:  Vitals:   04/22/22 1307 04/22/22 1606  BP: (!) 175/93 (!) 188/108  Pulse: 99   Resp: 13   Temp: 36.5 C   SpO2:      Last Pain:  Vitals:   04/22/22 1307  TempSrc: Oral  PainSc:                  Nyelle Wolfson

## 2022-04-22 NOTE — Transfer of Care (Signed)
Immediate Anesthesia Transfer of Care Note  Patient: Jeffrey Fitzgerald  Procedure(s) Performed: RIGHT ABOVE KNEE AMPUTATION WITH WOUND VAC SPONGE CHANGEOUT TO RIGHT GROIN (Right)  Patient Location: PACU  Anesthesia Type:General  Level of Consciousness: drowsy  Airway & Oxygen Therapy: Patient Spontanous Breathing and Patient connected to face mask oxygen  Post-op Assessment: Report given to RN and Post -op Vital signs reviewed and stable  Post vital signs: Reviewed and stable  Last Vitals:  Vitals Value Taken Time  BP 137/87 04/22/22 1136  Temp    Pulse 98 04/22/22 1138  Resp 7 04/22/22 1138  SpO2 100 % 04/22/22 1138  Vitals shown include unvalidated device data.  Last Pain:  Vitals:   04/22/22 0845  TempSrc:   PainSc: 1       Patients Stated Pain Goal: 0 (04/15/22 0010)  Complications: No notable events documented.

## 2022-04-23 ENCOUNTER — Encounter (HOSPITAL_COMMUNITY): Payer: Self-pay | Admitting: Surgery

## 2022-04-23 LAB — URINALYSIS, ROUTINE W REFLEX MICROSCOPIC
Bilirubin Urine: NEGATIVE
Glucose, UA: NEGATIVE mg/dL
Ketones, ur: NEGATIVE mg/dL
Nitrite: NEGATIVE
Protein, ur: 30 mg/dL — AB
Specific Gravity, Urine: 1.017 (ref 1.005–1.030)
pH: 6 (ref 5.0–8.0)

## 2022-04-23 LAB — CBC
HCT: 27.1 % — ABNORMAL LOW (ref 39.0–52.0)
Hemoglobin: 9.4 g/dL — ABNORMAL LOW (ref 13.0–17.0)
MCH: 29.9 pg (ref 26.0–34.0)
MCHC: 34.7 g/dL (ref 30.0–36.0)
MCV: 86.3 fL (ref 80.0–100.0)
Platelets: 238 10*3/uL (ref 150–400)
RBC: 3.14 MIL/uL — ABNORMAL LOW (ref 4.22–5.81)
RDW: 17.1 % — ABNORMAL HIGH (ref 11.5–15.5)
WBC: 18.3 10*3/uL — ABNORMAL HIGH (ref 4.0–10.5)
nRBC: 0.8 % — ABNORMAL HIGH (ref 0.0–0.2)

## 2022-04-23 LAB — BASIC METABOLIC PANEL
Anion gap: 9 (ref 5–15)
BUN: 18 mg/dL (ref 6–20)
CO2: 21 mmol/L — ABNORMAL LOW (ref 22–32)
Calcium: 7.6 mg/dL — ABNORMAL LOW (ref 8.9–10.3)
Chloride: 106 mmol/L (ref 98–111)
Creatinine, Ser: 1.86 mg/dL — ABNORMAL HIGH (ref 0.61–1.24)
GFR, Estimated: 44 mL/min — ABNORMAL LOW (ref >60)
Glucose, Bld: 104 mg/dL — ABNORMAL HIGH (ref 70–99)
Potassium: 3.5 mmol/L (ref 3.5–5.1)
Sodium: 136 mmol/L (ref 135–145)

## 2022-04-23 LAB — CULTURE, BLOOD (ROUTINE X 2)
Culture: NO GROWTH
Culture: NO GROWTH
Special Requests: ADEQUATE
Special Requests: ADEQUATE

## 2022-04-23 NOTE — Progress Notes (Signed)
This chaplain responded to PMT NP-Julia's consult for creating/updating the Pt. Advance Directive and naming the Pt. sister-Gina Joung as HCPOA.  The Pt. is awake and agreeable to AD education. The chaplain understands the Pt. prefers to discuss the AD with his sister-Gina before completing. An incomplete document was left with the Pt. The chaplain will F/U with the Pt. on Monday.  Chaplain Stephanie Acre [phone]

## 2022-04-23 NOTE — Progress Notes (Addendum)
Progress Note    04/23/2022 7:46 AM 1 Day Post-Op  Subjective:  no complaints this morning   Vitals:   04/22/22 2339 04/23/22 0402  BP: (!) 146/73 (!) 164/68  Pulse: (!) 106 (!) 105  Resp: 18 17  Temp: 98.2 F (36.8 C) 98.2 F (36.8 C)  SpO2: 100% 96%   Physical Exam: Lungs:  non labored Incisions:  R groin vac in place with good seal Extremities:  R AKA dressing in place, no breakthrough bleeding; L foot cool to touch Abdomen: soft, NT Neurologic: A&O  CBC    Component Value Date/Time   WBC 18.3 (H) 04/23/2022 0308   RBC 3.14 (L) 04/23/2022 0308   HGB 9.4 (L) 04/23/2022 0308   HCT 27.1 (L) 04/23/2022 0308   PLT 238 04/23/2022 0308   MCV 86.3 04/23/2022 0308   MCH 29.9 04/23/2022 0308   MCHC 34.7 04/23/2022 0308   RDW 17.1 (H) 04/23/2022 0308   LYMPHSABS 2.0 04/09/2022 0009   MONOABS 1.7 (H) 04/09/2022 0009   EOSABS 0.0 04/09/2022 0009   BASOSABS 0.0 04/09/2022 0009    BMET    Component Value Date/Time   NA 136 04/23/2022 0308   K 3.5 04/23/2022 0308   CL 106 04/23/2022 0308   CO2 21 (L) 04/23/2022 0308   GLUCOSE 104 (H) 04/23/2022 0308   BUN 18 04/23/2022 0308   CREATININE 1.86 (H) 04/23/2022 0308   CALCIUM 7.6 (L) 04/23/2022 0308   GFRNONAA 44 (L) 04/23/2022 0308   GFRAA >60 01/12/2020 1421    INR    Component Value Date/Time   INR 1.2 04/11/2022 0437     Intake/Output Summary (Last 24 hours) at 04/23/2022 0746 Last data filed at 04/23/2022 4235 Gross per 24 hour  Intake 1180 ml  Output 2650 ml  Net -1470 ml     Assessment/Plan:  51 y.o. male is s/p R AKA with groin washout 1 Day Post-Op   R AKA dressing left in place, will take down tomorrow R groin vac with good seal, continue vac for a week L foot ischemic after thrombosis of LLE bypass; he is aware he will require amputation if pain becomes unbearable or tissue loss worsens Palliative to continue goals of care discussions with patient and his sister   Iline Oven [facility] [phone] 04/23/2022 7:46 AM  I have seen and evaluated the patient. I agree with the PA note as documented above.  51 year old male now status post aortobifemoral bypass with bilateral fem-tib bypasses.  Unfortunately his muscle in the right leg became necrotic with a rising leukocytosis and was nonviable even with a patent bypass.  He underwent right above-knee amputation yesterday.  Dressing is clean and dry.  Pain controlled.  He has a sartorius muscle flap in the right groin with VAC that we will leave through the weekend with myriad to try and get granulation formation and graft coverage.  Vac with good seal and no additional bleeding - vac change in OR yesterday.  We will keep him on broad-spectrum antibiotics for now.  Leukocytosis is improving today after amputation of the nonviable leg with WBC 27 --> 18.  The bypass in the left leg is known to be occluded even after multiple thrombectomies but he denies any pain in the left foot.  May ultimately require amputation in the left leg in the future but hopefully can get him to CIR.  Hgb 9.4 today.  No indication to restart heparin as this was for  left leg bypass now occluded since heparin held on Tuesday night.    Marty Heck, MD [facility] Office: [phone]

## 2022-04-23 NOTE — Progress Notes (Addendum)
IP rehab admissions - I met briefly with patient.  He is worried about cost of CIR since he has no insurance.  Shared info with unit case manager.  Will await PT/OT re-evaluations and then will have my partner follow up again with patient.  Call for questions.  [phone]

## 2022-04-23 NOTE — Progress Notes (Signed)
Occupational Therapy Treatment Patient Details Name: Jeffrey Fitzgerald MRN: [id number] DOB: 1970/10/03 Today's Date: 04/23/2022   History of present illness Pt is a 51 yo male admitted 04/01/22 from cath lab after aortogram for LLE ischemia. S/p aortobifemoral bypass graft with bilateral femoral endarterectomy on 8/9. Post op RLE ischemia with return to OR same date for redo R groin exploration with thrombectomy and angioplasty. S/p RLE fem-pop bypass graft with 4 compartment fasciotomy on 8/10. S/p thrombectomy left femoral to popliteal BPG wtih closure and right fasciotomy wtih VAC on right LE on 8/11. Ileus with emesis on 8/14. R AKA 8/24. PMH includes HTN, tobacco smoking, PVD.   OT comments  Patient able to progress to edge of bed and eventually squat pivot to recliner with Max A.  He demonstrates fair sit balance, and requires max cues for transfer technique and hand placement.  OT will continue efforts to progress independence with seated ADL and toilet transfers.  AIR is following to determine appropriateness.     Recommendations for follow up therapy are one component of a multi-disciplinary discharge planning process, led by the attending physician.  Recommendations may be updated based on patient status, additional functional criteria and insurance authorization.    Follow Up Recommendations  Acute inpatient rehab (3hours/day)    Assistance Recommended at Discharge Frequent or constant Supervision/Assistance  Patient can return home with the following  Two people to help with walking and/or transfers;A lot of help with bathing/dressing/bathroom;Direct supervision/assist for medications management;Assist for transportation;Help with stairs or ramp for entrance   Equipment Recommendations  Wheelchair (measurements OT);Wheelchair cushion (measurements OT);BSC/3in1    Recommendations for Other Services      Precautions / Restrictions Precautions Precautions: Fall;Other  (comment) Precaution Comments: watch HR, bowel incontience Restrictions Weight Bearing Restrictions: Yes RLE Weight Bearing: Non weight bearing       Mobility Bed Mobility   Bed Mobility: Supine to Sit     Supine to sit: Mod assist, HOB elevated          Transfers Overall transfer level: Needs assistance   Transfers: Bed to chair/wheelchair/BSC     Squat pivot transfers: Max assist             Balance Overall balance assessment: Needs assistance Sitting-balance support: No upper extremity supported, Feet supported Sitting balance-Leahy Scale: Fair     Standing balance support: Bilateral upper extremity supported Standing balance-Leahy Scale: Poor                             ADL either performed or assessed with clinical judgement   ADL                   Upper Body Dressing : Minimal assistance;Moderate assistance;Sitting   Lower Body Dressing: Total assistance;Bed level   Toilet Transfer: Maximal assistance;Squat-pivot                  Extremity/Trunk Assessment Upper Extremity Assessment Upper Extremity Assessment: Generalized weakness   Lower Extremity Assessment Lower Extremity Assessment: Defer to PT evaluation   Cervical / Trunk Assessment Cervical / Trunk Assessment: Normal    Vision       Perception     Praxis      Cognition Arousal/Alertness: Awake/alert Behavior During Therapy: Flat affect Overall Cognitive Status: No family/caregiver present to determine baseline cognitive functioning  Following Commands: Follows one step commands with increased time, Follows multi-step commands with increased time Safety/Judgement: Decreased awareness of safety, Decreased awareness of deficits Awareness: Emergent Problem Solving: Slow processing, Decreased initiation, Requires verbal cues, Requires tactile cues          Exercises      Shoulder Instructions       General  Comments      Pertinent Vitals/ Pain       Pain Assessment Pain Assessment: Faces Faces Pain Scale: Hurts even more Pain Location: RLE with mobility Pain Descriptors / Indicators: Grimacing Pain Intervention(s): Monitored during session                                                          Frequency  Min 2X/week        Progress Toward Goals  OT Goals(current goals can now be found in the care plan section)     Acute Rehab OT Goals OT Goal Formulation: With patient Time For Goal Achievement: 05/07/22  Plan      Co-evaluation                 AM-PAC OT "6 Clicks" Daily Activity     Outcome Measure   Help from another person eating meals?: None Help from another person taking care of personal grooming?: A Little Help from another person toileting, which includes using toliet, bedpan, or urinal?: A Lot Help from another person bathing (including washing, rinsing, drying)?: A Lot Help from another person to put on and taking off regular upper body clothing?: A Lot Help from another person to put on and taking off regular lower body clothing?: A Lot 6 Click Score: 15    End of Session Equipment Utilized During Treatment: Gait belt  OT Visit Diagnosis: Unsteadiness on feet (R26.81);Other abnormalities of gait and mobility (R26.89);Muscle weakness (generalized) (M62.81);Other symptoms and signs involving cognitive function;Pain Pain - Right/Left: Right Pain - part of body: Leg   Activity Tolerance Patient limited by fatigue   Patient Left in chair;with call bell/phone within reach;with chair alarm set   Nurse Communication Mobility status        Time: 2505-3976 OT Time Calculation (min): 26 min  Charges: OT General Charges $OT Visit: 1 Visit OT Treatments $Self Care/Home Management : 8-22 mins $Therapeutic Activity: 8-22 mins  04/23/2022  RP, OTR/L  Acute Rehabilitation Services  Office:  [phone]   [name] 04/23/2022, 2:56 PM

## 2022-04-23 NOTE — Progress Notes (Addendum)
Physical Therapy Treatment Patient Details Name: Jeffrey Fitzgerald MRN: [id number] DOB: 1971-08-07 Today's Date: 04/23/2022   History of Present Illness Pt is a 51 yo male admitted 04/01/22 from cath lab after aortogram for LLE ischemia. S/p aortobifemoral bypass graft with bilateral femoral endarterectomy on 8/9. Post op RLE ischemia with return to OR same date for redo R groin exploration with thrombectomy and angioplasty. S/p RLE fem-pop bypass graft with 4 compartment fasciotomy on 8/10. S/p thrombectomy left femoral to popliteal BPG wtih closure and right fasciotomy wtih VAC on right LE on 8/11. Ileus with emesis on 8/14. R AKA 8/24. PMH includes HTN, tobacco smoking, PVD.    PT Comments    Pt tolerates bed mobility well but declines attempts at out of bed mobility despite encouragement from PT. Pt is unable to report why he would not like to attempt out of bed mobility, only stating that he only wants to sit on the edge of bed. PT provides education on multiple transfer techniques with R AKA. Pt will benefit from continued encouragement to participate in out of bed mobility. PT anticipates the pt will require significant physical assistance with initial transfer attempts as he continues to require assistance to mobilize in bed and will need to re-orient his balance after AKA. PT continues to recommend AIR admission, however the pt must consistently participate in out of bed mobility to become an ideal candidate. Goals and plan of care updated according to patient's current level of function.  Recommendations for follow up therapy are one component of a multi-disciplinary discharge planning process, led by the attending physician.  Recommendations may be updated based on patient status, additional functional criteria and insurance authorization.  Follow Up Recommendations  Acute inpatient rehab (3hours/day) (pt will need to demonstrate improved willingness to participate in out of bed mobility to be  an ideal candidate) Can patient physically be transported by private vehicle: No   Assistance Recommended at Discharge Frequent or constant Supervision/Assistance  Patient can return home with the following A lot of help with walking and/or transfers;A lot of help with bathing/dressing/bathroom;Assistance with cooking/housework;Direct supervision/assist for medications management;Direct supervision/assist for financial management;Assist for transportation;Help with stairs or ramp for entrance   Equipment Recommendations  Wheelchair (measurements PT);Wheelchair cushion (measurements PT);Other (comment) (sliding board)    Recommendations for Other Services       Precautions / Restrictions Precautions Precautions: Fall;Other (comment) Restrictions Weight Bearing Restrictions: Yes RLE Weight Bearing: Non weight bearing     Mobility  Bed Mobility Overal bed mobility: Needs Assistance Bed Mobility: Supine to Sit     Supine to sit: Mod assist, HOB elevated     General bed mobility comments: verbal cues for sequencing, assistance with use of bed pad to pivot hips    Transfers Overall transfer level:  (pt refuses attempts at sit to stand or out of bed mobility)                      Ambulation/Gait                   Stairs             Wheelchair Mobility    Modified Rankin (Stroke Patients Only)       Balance Overall balance assessment: Needs assistance Sitting-balance support: No upper extremity supported, Feet supported Sitting balance-Leahy Scale: Good  Cognition Arousal/Alertness: Awake/alert Behavior During Therapy: Flat affect Overall Cognitive Status: No family/caregiver present to determine baseline cognitive functioning Area of Impairment: Attention, Following commands, Safety/judgement, Awareness, Problem solving                   Current Attention Level: Focused    Following Commands: Follows one step commands with increased time, Follows multi-step commands with increased time Safety/Judgement: Decreased awareness of safety, Decreased awareness of deficits Awareness: Emergent Problem Solving: Slow processing, Decreased initiation, Requires verbal cues, Requires tactile cues          Exercises      General Comments General comments (skin integrity, edema, etc.): VSS on RA, tachy into low 100s, wound vac on R AKA      Pertinent Vitals/Pain Pain Assessment Pain Assessment: Faces Faces Pain Scale: Hurts even more Pain Location: RLE with mobility Pain Descriptors / Indicators: Grimacing Pain Intervention(s): Premedicated before session    Home Living                          Prior Function            PT Goals (current goals can now be found in the care plan section) Acute Rehab PT Goals Patient Stated Goal: be able to walk PT Goal Formulation: With patient Time For Goal Achievement: 05/07/22 Potential to Achieve Goals: Fair Progress towards PT goals: Goals downgraded-see care plan    Frequency    Min 3X/week      PT Plan Current plan remains appropriate    Co-evaluation              AM-PAC PT "6 Clicks" Mobility   Outcome Measure  Help needed turning from your back to your side while in a flat bed without using bedrails?: A Little Help needed moving from lying on your back to sitting on the side of a flat bed without using bedrails?: A Lot Help needed moving to and from a bed to a chair (including a wheelchair)?: Total Help needed standing up from a chair using your arms (e.g., wheelchair or bedside chair)?: Total Help needed to walk in hospital room?: Total Help needed climbing 3-5 steps with a railing? : Total 6 Click Score: 9    End of Session   Activity Tolerance: Other (comment) (self-limiting, declines attempts at out of bed mobility despite expressing a desire to get into electric  scooter) Patient left: in bed;with call bell/phone within reach;with bed alarm set Nurse Communication: Mobility status;Need for lift equipment PT Visit Diagnosis: Other abnormalities of gait and mobility (R26.89);Difficulty in walking, not elsewhere classified (R26.2);Muscle weakness (generalized) (M62.81)     Time: 6433-2951 PT Time Calculation (min) (ACUTE ONLY): 27 min  Charges:  1 Re-eval                   Arlyss Gandy, PT, DPT Acute Rehabilitation Office [phone]    Arlyss Gandy 04/23/2022, 12:18 PM

## 2022-04-24 LAB — BASIC METABOLIC PANEL
Anion gap: 8 (ref 5–15)
BUN: 13 mg/dL (ref 6–20)
CO2: 21 mmol/L — ABNORMAL LOW (ref 22–32)
Calcium: 7.8 mg/dL — ABNORMAL LOW (ref 8.9–10.3)
Chloride: 106 mmol/L (ref 98–111)
Creatinine, Ser: 1.67 mg/dL — ABNORMAL HIGH (ref 0.61–1.24)
GFR, Estimated: 50 mL/min — ABNORMAL LOW (ref >60)
Glucose, Bld: 116 mg/dL — ABNORMAL HIGH (ref 70–99)
Potassium: 3.5 mmol/L (ref 3.5–5.1)
Sodium: 135 mmol/L (ref 135–145)

## 2022-04-24 LAB — CBC
HCT: 26.6 % — ABNORMAL LOW (ref 39.0–52.0)
Hemoglobin: 8.9 g/dL — ABNORMAL LOW (ref 13.0–17.0)
MCH: 30 pg (ref 26.0–34.0)
MCHC: 33.5 g/dL (ref 30.0–36.0)
MCV: 89.6 fL (ref 80.0–100.0)
Platelets: 250 10*3/uL (ref 150–400)
RBC: 2.97 MIL/uL — ABNORMAL LOW (ref 4.22–5.81)
RDW: 17 % — ABNORMAL HIGH (ref 11.5–15.5)
WBC: 17 10*3/uL — ABNORMAL HIGH (ref 4.0–10.5)
nRBC: 0.1 % (ref 0.0–0.2)

## 2022-04-24 NOTE — Progress Notes (Addendum)
  Progress Note    04/24/2022 9:16 AM 2 Days Post-Op  Subjective:  no complaints   Vitals:   04/24/22 0618 04/24/22 0801  BP: (!) 164/76 (!) 162/85  Pulse: (!) 107 (!) 111  Resp: 16 16  Temp: 98.2 F (36.8 C) 98.1 F (36.7 C)  SpO2: 99% 100%   Physical Exam: Lungs:  non labored Incisions:  R AKA c/d/i Extremities:  L foot cold to touch Neurologic: A&O  CBC    Component Value Date/Time   WBC 17.0 (H) 04/24/2022 0337   RBC 2.97 (L) 04/24/2022 0337   HGB 8.9 (L) 04/24/2022 0337   HCT 26.6 (L) 04/24/2022 0337   PLT 250 04/24/2022 0337   MCV 89.6 04/24/2022 0337   MCH 30.0 04/24/2022 0337   MCHC 33.5 04/24/2022 0337   RDW 17.0 (H) 04/24/2022 0337   LYMPHSABS 2.0 04/09/2022 0009   MONOABS 1.7 (H) 04/09/2022 0009   EOSABS 0.0 04/09/2022 0009   BASOSABS 0.0 04/09/2022 0009    BMET    Component Value Date/Time   NA 135 04/24/2022 0337   K 3.5 04/24/2022 0337   CL 106 04/24/2022 0337   CO2 21 (L) 04/24/2022 0337   GLUCOSE 116 (H) 04/24/2022 0337   BUN 13 04/24/2022 0337   CREATININE 1.67 (H) 04/24/2022 0337   CALCIUM 7.8 (L) 04/24/2022 0337   GFRNONAA 50 (L) 04/24/2022 0337   GFRAA >60 01/12/2020 1421    INR    Component Value Date/Time   INR 1.2 04/11/2022 0437     Intake/Output Summary (Last 24 hours) at 04/24/2022 0916 Last data filed at 04/24/2022 2409 Gross per 24 hour  Intake 120 ml  Output 1101 ml  Net -981 ml     Assessment/Plan:  51 y.o. male is s/p  R AKA with groin washout  2 Days Post-Op   R AKA well appearing R groin vac with good seal; vac change Monday Ischemic L foot with thrombosed bypass; patient is aware he will need L AKA with worsening pain or tissue loss Ok for d/c to CIR when approved   Emilie Rutter, PA-C [facility] [phone] 04/24/2022 9:16 AM  I agree with the above.  I have seen and evaluated the patient.  He is no longer complaining of left leg pain.  His amputation dressing is dry.  He is  stable for discharge to CIR when approved.  We will change his wound VAC dressing on Monday.  Durene Cal

## 2022-04-24 NOTE — Progress Notes (Signed)
Pharmacy Antibiotic Note  Jeffrey Fitzgerald is a 51 y.o. male admitted on 04/01/2022 with concern for wound infection. Pt with critical limb ischemia s/p  thrombectomy of L common femoral to posterior tibial artery bypass, medial right fasciotomy closure and vac placement on lateral fasciotomy now wet to dry dressing. Pharmacy has been consulted for Zosyn and vancomycin dosing.  Pt s/p AKA. Cr has improved, WBC improving as well. Vascular recommend to keep broad spectrum antibiotics for now on 8/25. Will hold off on level today since did not receive dose 8/24.   Plan: Continue zosyn 3.375g IV q8h Continue vancomycin to 1000mg  IV q24h  Height: 5\' 5"  (165.1 cm) Weight: 65.8 kg (145 lb) IBW/kg (Calculated) : 61.5  Temp (24hrs), Avg:98.2 F (36.8 C), Min:97.6 F (36.4 C), Max:98.6 F (37 C)  Recent Labs  Lab 04/20/22 0322 04/20/22 1026 04/21/22 0346 04/22/22 0307 04/23/22 0308 04/24/22 0337  WBC 27.7*  --  24.2* 18.9* 18.3* 17.0*  CREATININE 2.16* 2.00* 1.74* 1.73* 1.86* 1.67*    Estimated Creatinine Clearance: 46 mL/min (A) (by C-G formula based on SCr of 1.67 mg/dL (H)).    No Known Allergies  Antimicrobials this admission: Zosyn 8/4 >> 8/17; 8/20 >>  Vancomycin 8/4 >> 8/10; 8/21 >>8/23; 8/25>> Zyvox 8/10>>8/17  Dose adjustments this admission:   Microbiology results: 8/20 Bcx x2: ngtd x5  Thank you for allowing pharmacy to be a part of this patient's care.  02-11-1997, PharmD PGY1 Pharmacy Resident   04/24/2022 3:14 PM   Please check AMION for all [facility] Pharmacy numbers 04/24/2022

## 2022-04-25 LAB — BASIC METABOLIC PANEL
Anion gap: 8 (ref 5–15)
BUN: 10 mg/dL (ref 6–20)
CO2: 23 mmol/L (ref 22–32)
Calcium: 7.7 mg/dL — ABNORMAL LOW (ref 8.9–10.3)
Chloride: 105 mmol/L (ref 98–111)
Creatinine, Ser: 1.67 mg/dL — ABNORMAL HIGH (ref 0.61–1.24)
GFR, Estimated: 50 mL/min — ABNORMAL LOW (ref >60)
Glucose, Bld: 122 mg/dL — ABNORMAL HIGH (ref 70–99)
Potassium: 3.2 mmol/L — ABNORMAL LOW (ref 3.5–5.1)
Sodium: 136 mmol/L (ref 135–145)

## 2022-04-25 LAB — CBC
HCT: 26.4 % — ABNORMAL LOW (ref 39.0–52.0)
Hemoglobin: 8.7 g/dL — ABNORMAL LOW (ref 13.0–17.0)
MCH: 29.4 pg (ref 26.0–34.0)
MCHC: 33 g/dL (ref 30.0–36.0)
MCV: 89.2 fL (ref 80.0–100.0)
Platelets: 283 10*3/uL (ref 150–400)
RBC: 2.96 MIL/uL — ABNORMAL LOW (ref 4.22–5.81)
RDW: 16.3 % — ABNORMAL HIGH (ref 11.5–15.5)
WBC: 15.7 10*3/uL — ABNORMAL HIGH (ref 4.0–10.5)
nRBC: 0 % (ref 0.0–0.2)

## 2022-04-25 MED ORDER — HYDRALAZINE HCL 20 MG/ML IJ SOLN
10.0000 mg | Freq: Once | INTRAMUSCULAR | Status: AC
  Administered 2022-04-25: 10 mg via INTRAVENOUS
  Filled 2022-04-25: qty 1

## 2022-04-25 NOTE — Progress Notes (Signed)
Subjective  -   No acute overnight events Has been more hypertensive   Physical Exam:  Right above-knee amputation dressing clean and dry Right groin wound VAC with good seal Left leg perfused       Assessment/Plan:    -We will change right groin wound VAC tomorrow -Continue to monitor left leg for ischemic changes -Okay to go to rehab when bed available -Continue antibiotics  Jeffrey Fitzgerald 04/25/2022 8:47 AM --  Vitals:   04/25/22 0615 04/25/22 0803  BP: (!) 141/74 139/84  Pulse: (!) 108 (!) 117  Resp: 20 16  Temp:  98.6 F (37 C)  SpO2:  100%    Intake/Output Summary (Last 24 hours) at 04/25/2022 0847 Last data filed at 04/25/2022 0651 Gross per 24 hour  Intake 422.21 ml  Output 1650 ml  Net -1227.79 ml     Laboratory CBC    Component Value Date/Time   WBC 15.7 (H) 04/25/2022 0204   HGB 8.7 (L) 04/25/2022 0204   HCT 26.4 (L) 04/25/2022 0204   PLT 283 04/25/2022 0204    BMET    Component Value Date/Time   NA 136 04/25/2022 0204   K 3.2 (L) 04/25/2022 0204   CL 105 04/25/2022 0204   CO2 23 04/25/2022 0204   GLUCOSE 122 (H) 04/25/2022 0204   BUN 10 04/25/2022 0204   CREATININE 1.67 (H) 04/25/2022 0204   CALCIUM 7.7 (L) 04/25/2022 0204   GFRNONAA 50 (L) 04/25/2022 0204   GFRAA >60 01/12/2020 1421    COAG Lab Results  Component Value Date   INR 1.2 04/11/2022   INR 1.5 (H) 04/07/2022   INR 0.9 03/09/2022   No results found for: "PTT"  Antibiotics Anti-infectives (From admission, onward)    Start     Dose/Rate Route Frequency Ordered Stop   04/21/22 1315  vancomycin (VANCOCIN) IVPB 1000 mg/200 mL premix        1,000 mg 200 mL/hr over 60 Minutes Intravenous Every 24 hours 04/21/22 0933     04/19/22 1315  vancomycin (VANCOREADY) IVPB 750 mg/150 mL  Status:  Discontinued        750 mg 150 mL/hr over 60 Minutes Intravenous Every 24 hours 04/19/22 1218 04/21/22 0933   04/18/22 1000  piperacillin-tazobactam (ZOSYN) IVPB 3.375 g         3.375 g 12.5 mL/hr over 240 Minutes Intravenous Every 8 hours 04/18/22 0925     04/08/22 2200  linezolid (ZYVOX) IVPB 600 mg        600 mg 300 mL/hr over 60 Minutes Intravenous Every 12 hours 04/08/22 1622 04/15/22 2359   04/08/22 0000  ceFAZolin (ANCEF) IVPB 2g/100 mL premix        2 g 200 mL/hr over 30 Minutes Intravenous Every 8 hours 04/07/22 2316 04/08/22 1559   04/07/22 1529  ceFAZolin (ANCEF) IVPB 2g/100 mL premix  Status:  Discontinued        2 g 200 mL/hr over 30 Minutes Intravenous 30 min pre-op 04/07/22 1530 04/07/22 2005   04/06/22 0932  vancomycin (VANCOREADY) IVPB 750 mg/150 mL  Status:  Discontinued        750 mg 150 mL/hr over 60 Minutes Intravenous Every 12 hours 04/06/22 0903 04/08/22 1634   04/05/22 2200  vancomycin (VANCOREADY) IVPB 1250 mg/250 mL  Status:  Discontinued        1,250 mg 166.7 mL/hr over 90 Minutes Intravenous Every 48 hours 04/04/22 1728 04/05/22 1023   04/05/22 1800  vancomycin (VANCOREADY)  IVPB 1250 mg/250 mL  Status:  Discontinued        1,250 mg 166.7 mL/hr over 90 Minutes Intravenous Every 24 hours 04/05/22 1023 04/06/22 0903   04/02/22 2200  vancomycin (VANCOREADY) IVPB 750 mg/150 mL  Status:  Discontinued        750 mg 150 mL/hr over 60 Minutes Intravenous Every 12 hours 04/02/22 0853 04/04/22 1728   04/02/22 1000  piperacillin-tazobactam (ZOSYN) IVPB 3.375 g        3.375 g 12.5 mL/hr over 240 Minutes Intravenous Every 8 hours 04/02/22 0828 04/15/22 2359   04/02/22 1000  vancomycin (VANCOREADY) IVPB 1500 mg/300 mL        1,500 mg 150 mL/hr over 120 Minutes Intravenous  Once 04/02/22 0828 04/02/22 1141        V. Charlena Cross, M.D., [facility] [place] Office: [phone] Pager:  [phone]

## 2022-04-25 NOTE — Progress Notes (Signed)
Patient's blood pressure this am 178/81, 175/71 while sleeping. Denied any pain, last pain medication was 2015 last night. Dr. Myra Gianotti paged and notified. Received one time order of 10mg  iv hydralazine. See mar for medication administration. Plan of care continues.

## 2022-04-25 NOTE — Plan of Care (Signed)

## 2022-04-26 LAB — CBC
HCT: 27.1 % — ABNORMAL LOW (ref 39.0–52.0)
Hemoglobin: 9.1 g/dL — ABNORMAL LOW (ref 13.0–17.0)
MCH: 29.5 pg (ref 26.0–34.0)
MCHC: 33.6 g/dL (ref 30.0–36.0)
MCV: 88 fL (ref 80.0–100.0)
Platelets: 338 10*3/uL (ref 150–400)
RBC: 3.08 MIL/uL — ABNORMAL LOW (ref 4.22–5.81)
RDW: 15.9 % — ABNORMAL HIGH (ref 11.5–15.5)
WBC: 16.1 10*3/uL — ABNORMAL HIGH (ref 4.0–10.5)
nRBC: 0 % (ref 0.0–0.2)

## 2022-04-26 NOTE — Progress Notes (Signed)
OT Cancellation Note  Patient Details Name: Jeffrey Fitzgerald MRN: [id number] DOB: 19-Feb-1971   Cancelled Treatment:    Reason Eval/Treat Not Completed: Patient at procedure or test/ unavailable- wound care.  Will follow and see as able.   Barry Brunner, OT Acute Rehabilitation Services Office [phone]   Chancy Milroy 04/26/2022, 8:45 AM

## 2022-04-26 NOTE — Progress Notes (Signed)
Mobility Specialist: Progress Note   04/26/22 1807  Mobility  Activity Stood at bedside  Level of Assistance +2 (takes two people)  Press photographer wheel walker  RLE Weight Bearing NWB  Activity Response Tolerated well  $Mobility charge 1 Mobility   Pt received in the bed and agreeable to mobility. Pt required maxA with bed mobility and +2 modA to stand from EOB x2. Sheets changed while standing. Pt states he feels like he is going to fall but said he felt better on second attempt. Pt sitting EOB with call bell and phone at his side. Bed alarm is on.   [facility] Kimesha Claxton Mobility Specialist Mobility Specialist [address]: [phone]

## 2022-04-26 NOTE — Progress Notes (Addendum)
  Progress Note    04/26/2022 8:00 AM 4 Days Post-Op  Subjective:  no complaints   Vitals:   04/25/22 0803 04/25/22 0938  BP: 139/84   Pulse: (!) 117 (!) 104  Resp: 16   Temp: 98.6 F (37 C)   SpO2: 100%    Physical Exam: Lungs:  non labored Incisions:  R AKA c/d/I; R groin vac with good seal Extremities:  L foot cool to touch Abdomen:  soft, NT, ND Neurologic: A&O     CBC    Component Value Date/Time   WBC 16.1 (H) 04/26/2022 0602   RBC 3.08 (L) 04/26/2022 0602   HGB 9.1 (L) 04/26/2022 0602   HCT 27.1 (L) 04/26/2022 0602   PLT 338 04/26/2022 0602   MCV 88.0 04/26/2022 0602   MCH 29.5 04/26/2022 0602   MCHC 33.6 04/26/2022 0602   RDW 15.9 (H) 04/26/2022 0602   LYMPHSABS 2.0 04/09/2022 0009   MONOABS 1.7 (H) 04/09/2022 0009   EOSABS 0.0 04/09/2022 0009   BASOSABS 0.0 04/09/2022 0009    BMET    Component Value Date/Time   NA 136 04/25/2022 0204   K 3.2 (L) 04/25/2022 0204   CL 105 04/25/2022 0204   CO2 23 04/25/2022 0204   GLUCOSE 122 (H) 04/25/2022 0204   BUN 10 04/25/2022 0204   CREATININE 1.67 (H) 04/25/2022 0204   CALCIUM 7.7 (L) 04/25/2022 0204   GFRNONAA 50 (L) 04/25/2022 0204   GFRAA >60 01/12/2020 1421    INR    Component Value Date/Time   INR 1.2 04/11/2022 0437     Intake/Output Summary (Last 24 hours) at 04/26/2022 0800 Last data filed at 04/26/2022 0601 Gross per 24 hour  Intake 520.76 ml  Output 1150 ml  Net -629.24 ml     Assessment/Plan:  51 y.o. male is s/p R AKA with groin washout 4 Days Post-Op   R AKA incision healing well R groin vac with good seal; will change today with WOC RN Denies significant rest pain L foot Rehab when bed approved    Emilie Rutter, PA-C [facility] [phone] 04/26/2022 8:00 AM  VASCULAR STAFF ADDENDUM: I have independently interviewed and examined the patient. I agree with the above.  Right groin wound looks OK. Continue MWF VAC changes per WOC  RN. Asymptomatic occlusion of L infrainguinal bypass. Monitor for now. Mobilize as able. Ready for rehab.  Rande Brunt. Lenell Antu, MD [facility] Phone Number: [phone] 04/26/2022 12:09 PM

## 2022-04-26 NOTE — Consult Note (Addendum)
WOC Nurse Consult Note: Reason for Consult:NPWT (VAC) dressing change to Right groin wound.  Incisional Vac is no longer in place. Instead, black foam and traditional trac pad dressing in place.  There is now a Right AKA due to critical limb ischemia and tissue loss and dressing is intact.  Patient is given analgesia prior to dressing change and consents to dressing change at this time.  He has had a large, loose bowel movement that has contaminated the male external urinary manager.  This is all removed and cleansed and protective barrier cream to perineal skin and scrotum.  Redness is present. When urinary manager is removed, patient requests to void. He is given a urinal and voids a large amount of dark yellow urine.  Wound type: Surgical wound to right groin with muscle flap Pressure Injury POA: NA Measurement: Right groin:  12 cm x 5.2 cm x 1.7 cm  Wound bed: Ruddy red with 20% yellow tissue present Drainage (amount, consistency, odor) minimal serosanguinous no odor.  Periwound: some peeling of epithelium, minimal, consistent with medical adhesive related skin injury. I used adhesive remover today to loosen drape and will protect with skin prep and hydrocolloid strips.  There is a leg crease at 3 o'clock that can make a challenge to seal if not addressed  the hydrocolloid, or a barrier ring will work nicely.  Dressing procedure/placement/frequency:CLeanse right groin wound with NS and pat dry.  Skin prep to periwound skin and apply barrier ring or hydrocolloid strips to fill in uneven creases.  Cover with drape. Seal achieved at 75 mmHg. Change Mon/Wed/Fri.  Will follow.  Maple Hudson MSN, RN, FNP-BC CWON Wound, Ostomy, Continence Nurse Pager [phone]

## 2022-04-26 NOTE — Progress Notes (Signed)
PT Cancellation Note  Patient Details Name: Jeffrey Fitzgerald MRN: [id number] DOB: Jan 02, 1971   Cancelled Treatment:    Reason Eval/Treat Not Completed: Patient declined, no reason specified, pt declining mobility despite encouragement, asking this PTA to come back. Will check back as schedule allows to continue with PT POC.  Lenora Boys. PTA Acute Rehabilitation Services Office: [phone]    Catalina Antigua 04/26/2022, 4:27 PM

## 2022-04-26 NOTE — Progress Notes (Signed)
Inpatient Rehab Admissions Coordinator:    Pt. Not yet adequately participating s/p AKA. Will follow for potential CIR admit pending progress and participation with therapies.   Megan Salon, MS, CCC-SLP Rehab Admissions Coordinator  [phone] (celll) [phone] (office)

## 2022-04-27 DIAGNOSIS — R6510 Systemic inflammatory response syndrome (SIRS) of non-infectious origin without acute organ dysfunction: Secondary | ICD-10-CM

## 2022-04-27 DIAGNOSIS — I160 Hypertensive urgency: Secondary | ICD-10-CM | POA: Diagnosis present

## 2022-04-27 DIAGNOSIS — E876 Hypokalemia: Secondary | ICD-10-CM

## 2022-04-27 DIAGNOSIS — N179 Acute kidney failure, unspecified: Secondary | ICD-10-CM

## 2022-04-27 LAB — CBC
HCT: 26.5 % — ABNORMAL LOW (ref 39.0–52.0)
Hemoglobin: 8.8 g/dL — ABNORMAL LOW (ref 13.0–17.0)
MCH: 29.3 pg (ref 26.0–34.0)
MCHC: 33.2 g/dL (ref 30.0–36.0)
MCV: 88.3 fL (ref 80.0–100.0)
Platelets: 367 10*3/uL (ref 150–400)
RBC: 3 MIL/uL — ABNORMAL LOW (ref 4.22–5.81)
RDW: 15.8 % — ABNORMAL HIGH (ref 11.5–15.5)
WBC: 14.4 10*3/uL — ABNORMAL HIGH (ref 4.0–10.5)
nRBC: 0 % (ref 0.0–0.2)

## 2022-04-27 LAB — BASIC METABOLIC PANEL
Anion gap: 10 (ref 5–15)
BUN: 10 mg/dL (ref 6–20)
CO2: 23 mmol/L (ref 22–32)
Calcium: 8 mg/dL — ABNORMAL LOW (ref 8.9–10.3)
Chloride: 104 mmol/L (ref 98–111)
Creatinine, Ser: 1.6 mg/dL — ABNORMAL HIGH (ref 0.61–1.24)
GFR, Estimated: 52 mL/min — ABNORMAL LOW (ref >60)
Glucose, Bld: 101 mg/dL — ABNORMAL HIGH (ref 70–99)
Potassium: 3.3 mmol/L — ABNORMAL LOW (ref 3.5–5.1)
Sodium: 137 mmol/L (ref 135–145)

## 2022-04-27 MED ORDER — LABETALOL HCL 5 MG/ML IV SOLN: 10.0000 mg | INTRAVENOUS | Status: DC | PRN

## 2022-04-27 MED ORDER — AMLODIPINE BESYLATE 5 MG PO TABS
5.0000 mg | ORAL_TABLET | Freq: Every day | ORAL | Status: DC
  Administered 2022-04-27 – 2022-04-28 (×2): 5 mg via ORAL
  Filled 2022-04-27 (×3): qty 1

## 2022-04-27 MED ORDER — CALCIUM GLUCONATE-NACL 1-0.675 GM/50ML-% IV SOLN
1.0000 g | Freq: Once | INTRAVENOUS | Status: AC
  Administered 2022-04-27: 1000 mg via INTRAVENOUS
  Filled 2022-04-27: qty 50

## 2022-04-27 MED ORDER — METOPROLOL TARTRATE 5 MG/5ML IV SOLN
2.5000 mg | INTRAVENOUS | Status: DC | PRN
  Administered 2022-04-27: 5 mg via INTRAVENOUS
  Administered 2022-04-30: 2.5 mg via INTRAVENOUS
  Administered 2022-04-30: 5 mg via INTRAVENOUS
  Administered 2022-04-30: 2.5 mg via INTRAVENOUS
  Administered 2022-05-01: 5 mg via INTRAVENOUS
  Filled 2022-04-27 (×4): qty 5

## 2022-04-27 MED ORDER — CARVEDILOL 3.125 MG PO TABS
3.1250 mg | ORAL_TABLET | Freq: Two times a day (BID) | ORAL | Status: DC
  Administered 2022-04-27 – 2022-04-28 (×2): 3.125 mg via ORAL
  Filled 2022-04-27 (×2): qty 1

## 2022-04-27 MED ORDER — POTASSIUM CHLORIDE CRYS ER 20 MEQ PO TBCR
40.0000 meq | EXTENDED_RELEASE_TABLET | ORAL | Status: AC
  Administered 2022-04-27: 40 meq via ORAL
  Filled 2022-04-27: qty 2

## 2022-04-27 NOTE — Consult Note (Addendum)
Initial Consultation Note   Patient: Jeffrey Fitzgerald XKG:[id number] DOB: 11-06-1970 PCP: Patient, No Pcp Per DOA: 04/01/2022 DOS: the patient was seen and examined on 04/27/2022 Primary service: Cephus Shelling, MD   Reason for consult: Medical management     Assessment and Plan:  Critical limb ischemia Patient with critical limb ischemia of the bilateral lower extremities requiring several attempts at revascularization.  Ultimately patient underwent right AKA on 8/24.  Left AKA recommended but patient currently declining.  Palliative care has been recently consulted. -Continue aspirin and statin -Pain control -Per vascular surgery  SIRS Patient appears to be tachycardic and tachypneic with white blood cell count elevated at 14.4 appears to be trending down.  Previous blood cultures from 8/20 showed no growth.  Patient had temporarily been on Zosyn which has been since discontinued.  UA noted moderate hemoglobin with small leukocytes, rare bacteria, 6-10 WBCs.  He did complain of having urinary frequency.  Patient's right leg wound appears to be healing well when last checked.  Other things things to consider are the possibility of a blood clot, but currently patient's O2 saturation maintained on room air. -Check urine culture -Recheck blood cultures if patient spikes fever -Recheck CBC tomorrow morning  Hypertensive urgency And blood pressures elevated up to 198/86 and  heart rates elevated into the 120s.  He states he had not been on any blood pressure medications at home.  He had been started on hydralazine 10 mg twice daily, but had not obtain adequate blood pressure control. -Start Coreg 3.125 mg twice daily and amlodipine 5 mg daily -Uptitrate doses as needed -Continue prn regimen  Hypokalemia Acute.  Potassium 3.3 -Give 40 mEq of potassium chloride p.o.  Acute renal failure Resolving.  Patient baseline creatinine has been around 1-1.2, but trended up to 3.59 on 8/13 but  likely related to contrast given.  It is still able to make urine and creatinine has since trended down to 1.6 today. -Consider intermittently checking kidney function  Urinary frequency Patient reports having urinary frequency without dysuria.  Urinalysis from 8/25 was noted to be abnormal. -Check urine culture -Patient has been afebrile  Tobacco abuse -Nicotine patch  GERD -Continue Protonix  TRH will continue to follow the patient.  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  He then underwent left common femoral and posterior tibial bypass on 8/9 same-day for right common revision of the right lower extremity aortobifemoral bypass.  He returned to the OR on 8/11 and underwent thrombectomy of left common femoral to posterior tibial artery bypass, right lower extremity fasciotomy closure, and wound VAC placement.  White blood cell count had trended up to 24.8 and the patient has been temporarily treated empirically with Zosyn.  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  His hospital stay had been complicated by hemorrhagic bleed with hemoglobin trending down to 6 g/dL requiring 2 units of packed red blood cells and creatinine trended up to 3.59 before trending down.    He has been complaining of pain out of left leg which was noted to be cold and contracted.  Vascular had discussed need of left above-knee amputation, but he was not  willing to undergo amputation of the left leg.  Palliative care had been consulted.  Patient was noted to become more tachycardic and hypertensive with blood pressures elevated into the 200s  at times.  He denies any complaints of any chest pain, nausea, vomiting, or diarrhea.  Patient's white blood cell counts have been otherwise trending down.  TRH consulted to help with medical management.  Review of Systems: As mentioned in the history of present illness. All other systems reviewed and are negative. Past Medical History:  Diagnosis Date   Hypertension    Peripheral vascular disease (HCC)    Tobacco abuse    Past Surgical History:  Procedure Laterality Date   ABDOMINAL AORTOGRAM W/LOWER EXTREMITY N/A 04/01/2022   Procedure: ABDOMINAL AORTOGRAM LOWER EXTREMITY [place];  Surgeon: Cephus Shelling, MD;  Location: [facility];  Service: Cardiovascular;  Laterality: N/A;   AMPUTATION Right 04/22/2022   Procedure: RIGHT ABOVE KNEE AMPUTATION WITH WOUND VAC SPONGE CHANGEOUT TO RIGHT GROIN;  Surgeon: Nada Libman, MD;  Location: [facility] OR;  Service: Vascular;  Laterality: Right;   AORTA - BILATERAL FEMORAL ARTERY BYPASS GRAFT Bilateral 04/07/2022   Procedure: AORTA BIFEMORAL BYPASS WITH LEFT LEG BYPASS;  Surgeon: Cephus Shelling, MD;  Location: [facility] OR;  Service: Vascular;  Laterality: Bilateral;   APPLICATION OF WOUND VAC Right 04/08/2022   Procedure: APPLICATION OF WOUND VAC;  Surgeon: Leonie Douglas, MD;  Location: [facility] OR;  Service: Vascular;  Laterality: Right;   APPLICATION OF WOUND VAC Right 04/20/2022   Procedure: APPLICATION OF WOUND VAC;  Surgeon: Maeola Harman, MD;  Location[facility] OR;  Service: Vascular;  Laterality: Right;   FEMORAL ARTERY EXPLORATION Right 04/20/2022   Procedure: EXPLORATION OF RIGHT GROIN;  Surgeon: Maeola Harman, MD;  Location[facility];  Service: Vascular;  Laterality: Right;   FEMORAL-POPLITEAL BYPASS GRAFT Left 04/07/2022   Procedure: LEFT LEG BYPASS GRAFT FEMORAL-POPLITEAL ARTERY;  Surgeon: Cephus Shelling, MD;  Location: [facility] OR;  Service: Vascular;  Laterality: Left;   FEMORAL-TIBIAL BYPASS GRAFT Right 04/08/2022    Procedure: GREATER SAPHENOUS VEIN HARVEST, RIGHT LEG BYPASS GRAFT RIGHT COMMON FEMORAL-TIBIAL ARTERY WITH COMPOSITE GRAFT;  Surgeon: Leonie Douglas, MD;  Location: [facility] OR;  Service: Vascular;  Laterality: Right;   LOWER EXTREMITY ANGIOGRAM Right 04/08/2022   Procedure: LOWER EXTREMITY ANGIOGRAM;  Surgeon: Leonie Douglas, MD;  Location: [facility] OR;  Service: Vascular;  Laterality: Right;   PATCH ANGIOPLASTY Right 04/07/2022   Procedure: PATCH ANGIOPLASTY USING Livia Snellen BIOLOGIC;  Surgeon: Cephus Shelling, MD;  Location: [facility] OR;  Service: Vascular;  Laterality: Right;   THIGH FASCIOTOMY Right 04/08/2022   Procedure: FASCIOTOMIES;  Surgeon: Leonie Douglas, MD;  Location[facility] OR;  Service: Vascular;  Laterality: Right;   THROMBECTOMY FEMORAL ARTERY Right 04/07/2022   Procedure: RIGHT FEMORAL ARTERY THROMBECTOMY;  Surgeon: Cephus Shelling, MD;  Location: [facility] OR;  Service: Vascular;  Laterality: Right;   THROMBECTOMY FEMORAL ARTERY Right 04/08/2022   Procedure: THROMBECTOMY POSTERIOR TIBIAL;  Surgeon: Leonie Douglas, MD;  Location: [facility] OR;  Service: Vascular;  Laterality: Right;   THROMBECTOMY FEMORAL ARTERY Bilateral 04/09/2022   Procedure: LEFT  THROMBECTOMY FEMORAL POSTERIOR TIBIAL BYPASS AND RIGHT LATERAL  LOWER EXTREMITY FASCIOTOMY CLOSURE  AND WOUND VAC PLACEMENT;  Surgeon: Cephus Shelling, MD;  Location: [facility] OR;  Service: Vascular;  Laterality: Bilateral;   WOUND DEBRIDEMENT Right 04/20/2022   Procedure: IRRIGATION AND DEBRIDMENT OF LOWER LEG AND APPLICATION WOUND VAC GROIN;  Surgeon: Victorino Sparrow, MD;  Location: [facility] OR;  Service: Vascular;  Laterality: Right;   WOUND DEBRIDEMENT Right 04/20/2022   Procedure: DEBRIDEMENT WOUND, repaired anstamosis femoral artery and creation of sartorious flap.;  Surgeon: Maeola Harman, MD;  Location: [facility] OR;  Service: Vascular;  Laterality: Right;   Social History:  reports that he has been smoking cigarettes. He has a 15.00 pack-year smoking  history. He has never used smokeless tobacco. He reports that he does not currently use alcohol. He reports current drug use. Drug: Marijuana.  No Known Allergies  Family History  Problem Relation Age of Onset   Hypertension Father    Heart disease Father        No details.  Died in his earlies 61s and might have had heart attacks or coronary disease prior to that.    Prior to Admission medications   Medication Sig Start Date End Date Taking? Authorizing Provider  acetaminophen (TYLENOL) 500 MG tablet Take 500 mg by mouth every 6 (six) hours as needed for moderate pain.   Yes [provider]  ibuprofen (ADVIL) 200 MG tablet Take 200 mg by mouth every 6 (six) hours as needed for moderate pain.   Yes [provider]    Physical Exam: Vitals:   04/27/22 0817 04/27/22 0902 04/27/22 1239 04/27/22 1604  BP: (!) 174/89 (!) 169/90 (!) 158/91 (!) 156/86  Pulse:   (!) 110 (!) 110  Resp: 15 13 17 14   Temp:   (!) 97.5 F (36.4 C) 98.7 F (37.1 C)  TempSrc:   Oral Oral  SpO2:   100% 100%  Weight:      Height:       Constitutional: Middle-age male currently in no acute distress Eyes: PERRL, lids and conjunctivae normal ENMT: Mucous membranes are moist. Neck: normal, supple Respiratory: clear to auscultation bilaterally, no wheezing, no crackles. Normal respiratory effort. No accessory muscle use.  Cardiovascular: Tachycardic, no murmurs / rubs / gallops. No extremity edema.  Absent peripheral pulses. Abdomen: no tenderness, no masses palpated. Bowel sounds positive.  Musculoskeletal: no clubbing / cyanosis.  Right AKA  Skin: no rashes, lesions, ulcers. No induration   Data Reviewed:   Reviewed labs and imaging as noted above   Primary team communication:  Thank you very much for involving in the care of your patient.  Author: [place], MD 04/27/2022 4:43 PM  For on call review [email or web address]/29/[email or web address].

## 2022-04-27 NOTE — Progress Notes (Addendum)
   04/27/22 0509  Vitals  Temp 98.1 F (36.7 C)  Temp Source Oral  BP (!) 170/97  MAP (mmHg) 115  BP Location Left Arm  BP Method Automatic  Patient Position (if appropriate) Lying  Pulse Rate (!) 108  Pulse Rate Source Monitor  ECG Heart Rate (!) 114  Resp 11  Level of Consciousness  Level of Consciousness Alert  MEWS COLOR  MEWS Score Color Yellow  Oxygen Therapy  SpO2 100 %  O2 Device Room Air  Pain Assessment  Pain Scale 0-10  Pain Score 0  MEWS Score  MEWS Temp 0  MEWS Systolic 0  MEWS Pulse 2  MEWS RR 1  MEWS LOC 0  MEWS Score 3   Patient sleeping was called by CCM that patient had a short run of SVT 140-150 then back down to S.T. 100-120 cont. To monitor patient and rhythm. Also BP has been high at times.

## 2022-04-27 NOTE — Progress Notes (Signed)
Mobility Specialist: Progress Note   04/27/22 1820  Mobility  Activity Transferred from chair to bed  Level of Assistance Maximum assist, patient does 25-49%  Assistive Device Stedy  RLE Weight Bearing NWB  Activity Response Tolerated well  $Mobility charge 1 Mobility   Pt assisted back to bed per request and assisted with pericare with help from RN. Pt sitting EOB, set up with his dinner with call bell and phone at his side.   [facility] Suni Jarnagin Mobility Specialist Mobility Specialist [address]: [phone]

## 2022-04-27 NOTE — Progress Notes (Signed)
Physical Therapy Treatment Patient Details Name: Jeffrey Fitzgerald MRN: [id number] DOB: 04/17/1971 Today's Date: 04/27/2022   History of Present Illness Pt is a 51 yo male admitted 04/01/22 from cath lab after aortogram for LLE ischemia. S/p aortobifemoral bypass graft with bilateral femoral endarterectomy on 8/9. Post op RLE ischemia with return to OR same date for redo R groin exploration with thrombectomy and angioplasty. S/p RLE fem-pop bypass graft with 4 compartment fasciotomy on 8/10. S/p thrombectomy left femoral to popliteal BPG wtih closure and right fasciotomy wtih VAC on right LE on 8/11. Ileus with emesis on 8/14. 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 PMH includes HTN, tobacco smoking, PVD.    PT Comments    Pt received supine and agreeable to session with steady progress towards acute goals. Session focused on transfer training for increased functional independence and activity tolerance with pt able to stand pivot to recliner with total assist of 2 and RW. Pt requiring min assist for bed mobility for sequencing and use of bed pad to pivot hips and scoot to EOB. Pt reluctant to accept weight through LLE throughout session and unable to achieve full upright standing with RW and +2 max assist despite max multimodal cues as pt with flexed hips, L knee and trunk. LE therex focused on increased strength of R residual limb and increased knee extension on L. Pt educated re; positioning, activity recommendations and importance of continued mobility with pt verbalizing understanding. Pt continues to benefit from skilled PT services to progress toward functional mobility goals.    Recommendations for follow up therapy are one component of a multi-disciplinary discharge planning process, led by the attending physician.  Recommendations may be updated based on patient status, additional functional criteria and insurance authorization.  Follow Up Recommendations  Acute inpatient rehab (3hours/day) Can patient physically be  transported by private vehicle: No   Assistance Recommended at Discharge Frequent or constant Supervision/Assistance  Patient can return home with the following A lot of help with walking and/or transfers;A lot of help with bathing/dressing/bathroom;Assistance with cooking/housework;Direct supervision/assist for medications management;Direct supervision/assist for financial management;Assist for transportation;Help with stairs or ramp for entrance   Equipment Recommendations  Wheelchair (measurements PT);Wheelchair cushion (measurements PT);Other (comment) (sliding board)    Recommendations for Other Services       Precautions / Restrictions Precautions Precautions: Fall;Other (comment) Precaution Comments: watch HR, bowel incontience Restrictions Weight Bearing Restrictions: Yes RLE Weight Bearing: Non weight bearing     Mobility  Bed Mobility Overal bed mobility: Needs Assistance Bed Mobility: Supine to Sit     Supine to sit: HOB elevated, Min assist     General bed mobility comments: increased verbal cues for sequencing, assistance with use of bed pad to pivot hips    Transfers Overall transfer level: Needs assistance Equipment used: Rolling walker (2 wheels) Transfers: Bed to chair/wheelchair/BSC Sit to Stand: Max assist, +2 physical assistance, +2 safety/equipment Stand pivot transfers: Total assist, +2 physical assistance         General transfer comment: pt able to come to standing x1 trial at EOB with flexed trunk with pt unable to correct despite max cues and physical assist. total assist to stand pivot to recliner as pt picking LLE up from ground once pivot initiated    Ambulation/Gait               General Gait Details: unable   Stairs             Wheelchair Mobility    Modified Rankin (Stroke  Patients Only)       Balance Overall balance assessment: Needs assistance Sitting-balance support: No upper extremity supported, Feet  supported Sitting balance-Leahy Scale: Good Sitting balance - Comments: statically   Standing balance support: Bilateral upper extremity supported Standing balance-Leahy Scale: Zero Standing balance comment: relies on BUE and external support; dependent for posterior pericare; unable to achieve fully upright standing (trunk/hip/knee flexion with pain)                            Cognition Arousal/Alertness: Awake/alert Behavior During Therapy: Flat affect Overall Cognitive Status: No family/caregiver present to determine baseline cognitive functioning Area of Impairment: Attention, Following commands, Safety/judgement, Awareness, Problem solving                     Memory: Decreased short-term memory Following Commands: Follows one step commands with increased time, Follows multi-step commands with increased time Safety/Judgement: Decreased awareness of safety, Decreased awareness of deficits   Problem Solving: Slow processing, Decreased initiation, Requires verbal cues, Requires tactile cues          Exercises General Exercises - Lower Extremity Quad Sets: AROM, AAROM, Left, 10 reps, Right Long Arc Quad: AAROM, Left, 10 reps, Seated Hip Flexion/Marching: Right, 10 reps Other Exercises Other Exercises: pt declinging further LE therex    General Comments General comments (skin integrity, edema, etc.): VSS on RA, tachy into low 100s, wound vac on R groin      Pertinent Vitals/Pain Pain Assessment Pain Assessment: Faces Faces Pain Scale: Hurts little more Pain Location: BLE Pain Descriptors / Indicators: Grimacing Pain Intervention(s): Monitored during session, Limited activity within patient's tolerance, Repositioned    Home Living                          Prior Function            PT Goals (current goals can now be found in the care plan section) Acute Rehab PT Goals Patient Stated Goal: to get out of here PT Goal Formulation: With  patient Time For Goal Achievement: 05/07/22    Frequency    Min 3X/week      PT Plan Current plan remains appropriate    Co-evaluation              AM-PAC PT "6 Clicks" Mobility   Outcome Measure  Help needed turning from your back to your side while in a flat bed without using bedrails?: A Little Help needed moving from lying on your back to sitting on the side of a flat bed without using bedrails?: A Lot Help needed moving to and from a bed to a chair (including a wheelchair)?: Total Help needed standing up from a chair using your arms (e.g., wheelchair or bedside chair)?: Total Help needed to walk in hospital room?: Total Help needed climbing 3-5 steps with a railing? : Total 6 Click Score: 9    End of Session   Activity Tolerance: Patient tolerated treatment well;Patient limited by fatigue Patient left: with call bell/phone within reach;in chair;with chair alarm set Nurse Communication: Mobility status PT Visit Diagnosis: Other abnormalities of gait and mobility (R26.89);Difficulty in walking, not elsewhere classified (R26.2);Muscle weakness (generalized) (M62.81)     Time: 7510-2585 PT Time Calculation (min) (ACUTE ONLY): 35 min  Charges:  $Therapeutic Activity: 23-37 mins  Lenora Boys. PTA Acute Rehabilitation Services Office: [phone]    Catalina Antigua 04/27/2022, 12:35 PM

## 2022-04-27 NOTE — Progress Notes (Addendum)
  Progress Note    04/27/2022 8:00 AM 5 Days Post-Op  Subjective:  hypertensive and tachycardic overnight   Vitals:   04/27/22 0509 04/27/22 0739  BP: (!) 170/97 (!) 180/99  Pulse: (!) 108 (!) 118  Resp: 11 15  Temp: 98.1 F (36.7 C) 98.1 F (36.7 C)  SpO2: 100% 100%   Physical Exam: Cardiac: tachycardic Lungs:  non labored Incisions:  R AKA well appearing; vac R groin; incisions are well appearing LLE; painful cool L foot with contracted knee Neurologic: A&O  CBC    Component Value Date/Time   WBC 14.4 (H) 04/27/2022 0212   RBC 3.00 (L) 04/27/2022 0212   HGB 8.8 (L) 04/27/2022 0212   HCT 26.5 (L) 04/27/2022 0212   PLT 367 04/27/2022 0212   MCV 88.3 04/27/2022 0212   MCH 29.3 04/27/2022 0212   MCHC 33.2 04/27/2022 0212   RDW 15.8 (H) 04/27/2022 0212   LYMPHSABS 2.0 04/09/2022 0009   MONOABS 1.7 (H) 04/09/2022 0009   EOSABS 0.0 04/09/2022 0009   BASOSABS 0.0 04/09/2022 0009    BMET    Component Value Date/Time   NA 137 04/27/2022 0212   K 3.3 (L) 04/27/2022 0212   CL 104 04/27/2022 0212   CO2 23 04/27/2022 0212   GLUCOSE 101 (H) 04/27/2022 0212   BUN 10 04/27/2022 0212   CREATININE 1.60 (H) 04/27/2022 0212   CALCIUM 8.0 (L) 04/27/2022 0212   GFRNONAA 52 (L) 04/27/2022 0212   GFRAA >60 01/12/2020 1421    INR    Component Value Date/Time   INR 1.2 04/11/2022 0437     Intake/Output Summary (Last 24 hours) at 04/27/2022 0800 Last data filed at 04/27/2022 0513 Gross per 24 hour  Intake 480 ml  Output 2500 ml  Net -2020 ml     Assessment/Plan:  51 y.o. male is s/p  R AKA with groin washout 5 Days Post-Op   R AKA well appearing R groin vac change again tomorrow L foot painful and cold with contracted knee; max assist and not tolerating weightbearing on L foot; discussed need for L AKA with the patient and he would rather be made comfortable than have another amputation.  We will reach back out to palliative medicine B blockade added  prn   Emilie Rutter, PA-C [facility] [phone] 04/27/2022 8:00 AM  VASCULAR STAFF ADDENDUM: I have independently interviewed and examined the patient. I agree with the above.   Jeffrey Fitzgerald. Lenell Antu, MD [facility] Phone Number: [phone] 04/27/2022 8:18 AM

## 2022-04-27 NOTE — Progress Notes (Signed)
This chaplain is present with the Pt. for F/U on the Pt. AD:  HCPOA.    The chaplain understands the Pt. talked to his sister-Gina and filled out the [facility] paperwork. The Pt. shares his questions are answered and he is ready to proceed with the notary and witnesses.   The chaplain understands hospital volunteers/witnesses are not available today. The chaplain will revisit and contact the appropriate services on Wednesday.   Chaplain Stephanie Acre [phone]

## 2022-04-27 NOTE — Progress Notes (Signed)
Patient's wound vac was alarming "air leak". Assessed site and found that the track pad was completely removed from the black foam.  Placed another track pad on. No air leak noted. Wound vac has a good seal.  Will continue to monitor.

## 2022-04-27 NOTE — Progress Notes (Signed)
Inpatient Rehab Admissions Coordinator:    I spoke with Pt. To discuss improving participation with therapies in order to be considered for CIR. Explained that he must be consistently participating with therapies in order for CIR to take him. Pt. States that he will work with therapy going forward.   Megan Salon, MS, CCC-SLP Rehab Admissions Coordinator  [phone] (celll) [phone] (office)

## 2022-04-28 DIAGNOSIS — Z7189 Other specified counseling: Secondary | ICD-10-CM

## 2022-04-28 DIAGNOSIS — K219 Gastro-esophageal reflux disease without esophagitis: Secondary | ICD-10-CM

## 2022-04-28 DIAGNOSIS — Z515 Encounter for palliative care: Secondary | ICD-10-CM

## 2022-04-28 LAB — CBC
HCT: 25.9 % — ABNORMAL LOW (ref 39.0–52.0)
Hemoglobin: 8.6 g/dL — ABNORMAL LOW (ref 13.0–17.0)
MCH: 29.6 pg (ref 26.0–34.0)
MCHC: 33.2 g/dL (ref 30.0–36.0)
MCV: 89 fL (ref 80.0–100.0)
Platelets: 378 10*3/uL (ref 150–400)
RBC: 2.91 MIL/uL — ABNORMAL LOW (ref 4.22–5.81)
RDW: 15.9 % — ABNORMAL HIGH (ref 11.5–15.5)
WBC: 11.9 10*3/uL — ABNORMAL HIGH (ref 4.0–10.5)
nRBC: 0 % (ref 0.0–0.2)

## 2022-04-28 LAB — BASIC METABOLIC PANEL
Anion gap: 9 (ref 5–15)
BUN: 11 mg/dL (ref 6–20)
CO2: 21 mmol/L — ABNORMAL LOW (ref 22–32)
Calcium: 8.2 mg/dL — ABNORMAL LOW (ref 8.9–10.3)
Chloride: 106 mmol/L (ref 98–111)
Creatinine, Ser: 1.68 mg/dL — ABNORMAL HIGH (ref 0.61–1.24)
GFR, Estimated: 49 mL/min — ABNORMAL LOW (ref >60)
Glucose, Bld: 98 mg/dL (ref 70–99)
Potassium: 4.1 mmol/L (ref 3.5–5.1)
Sodium: 136 mmol/L (ref 135–145)

## 2022-04-28 LAB — MAGNESIUM: Magnesium: 2 mg/dL (ref 1.7–2.4)

## 2022-04-28 MED ORDER — CARVEDILOL 6.25 MG PO TABS
6.2500 mg | ORAL_TABLET | Freq: Two times a day (BID) | ORAL | Status: DC
  Administered 2022-04-28 – 2022-05-19 (×41): 6.25 mg via ORAL
  Filled 2022-04-28 (×41): qty 1

## 2022-04-28 NOTE — Progress Notes (Signed)
This chaplain is present with the Pt., notary, and witnesses for the notarizing of the Pt. Advance Directive:  HCPOA.  The Pt. named Jeffrey Fitzgerald as his healthcare agent.  The chaplain gave the Pt. the original AD along with one copy. The chaplain scanned the Pt. AD into the Pt. EMR.  This chaplain is available for F/U spiritual care as needed.  Chaplain Stephanie Acre [phone]

## 2022-04-28 NOTE — Plan of Care (Signed)
  Problem: Activity: Goal: Ability to return to baseline activity level will improve Outcome: Not Progressing   

## 2022-04-28 NOTE — Progress Notes (Signed)
Mobility Specialist Progress Note    04/28/22 1640  Mobility  Activity Transferred from chair to bed  Level of Assistance +2 (takes two people)  Assistive Device Stedy  RLE Weight Bearing NWB  Activity Response Tolerated well  $Mobility charge 1 Mobility   Pt received and agreeable. No complaints. Left with call bell in reach.   [place] Mobility Specialist

## 2022-04-28 NOTE — Progress Notes (Signed)
Palliative:  HPI: 51 y.o. male  with past medical history of peripheral vascular disease, hypertension, and tobacco abuse. He was admitted on 04/01/2022 with critical left lower limb ischemia requiring aortobifem bypass. He then had a left common femoral to posterior tibial bypass on 8/9. Returned to OR the same day for right common femoral thrombectomy and revision of the right lower extremity aortobifem bypass. Returned to OR 8/11 for left common femoral thrombectomy, right lower extremity fasciotomy closure and wound VAC placement. Per vascular surgery progress note 8/23, left leg bypass is likely thrombosed and he will require left AKA if he develops worsening tissue los or ischemic pain of left leg. Palliative Medicine was consulted for goals of care in the setting of scheduled right AKA for 8/24. 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 Palliative asked to revisit as there is need for LLE amputation but patient does not desire another amputation per report.   I met today today with Jeffrey Fitzgerald after discussing with RN, chaplain, and reviewing notes and records including from vascular. [name] is more animated and talkative than I anticipated. He seemed anxious to get out of the hospital and return to his home. I attempted to ask him about his left leg and he tells me "they say that leg isn't good either." I explained that his left leg seems to be falling in the same path of his right leg. We discussed amputation and he confirms that he would NOT want left leg amputation even if that meant he would die. I attempted to discuss with him his goals of care but he is fixated on returning to his home. He tells me that his sister, Jeffrey Fitzgerald, lives there and can help him. I tried to speak with him about care at home. I discussed with him that if he wishes to spend his time at home if we know his time is limited without left leg amputation this is understandable. I discussed with him hospice and he seemed surprised and says "I don't want  that." I tried to ask if he would be open to considering this in the future if he declined further and his leg gets worse and he did not answer. I attempted to discuss code status and he insists on full code. He continued to fixate on his desire to return home. I reassured him that I would notify his medical team of his desire and he gives me permission to speak with his sister, Jeffrey Fitzgerald, about how we can make this happen.   I called and spoke with Jeffrey Fitzgerald but she was working and unable to speak for very long. Jeffrey Fitzgerald had not been notified of the concern with his left leg and is not up to date on his condition and expectations. She shares that he wants to come home so he can smoke but they have told him that he has to stop smoking and that he will need rehab stay before returning home. She is surprised about his left leg and how this may change our plans and expectations. We were unable to have a good conversation today due to her work obligations but agree to speak more tomorrow.   All questions/concerns addressed to the best of my ability. Emotional support provided. Updated medical team.   Exam: Alert, oriented. No distress. Breathing regular, unlabored. Abd flat. R AKA.   Plan: - Ongoing goals of care conversation. Goals not aligned at this time.   [place], NP Palliative Medicine Team Pager [phone] (Please see  [email or web address] for schedule) Team Phone [phone]    Greater than 50%  of this time was spent counseling and coordinating care related to the above assessment and plan

## 2022-04-28 NOTE — Progress Notes (Signed)
OT Cancellation Note  Patient Details Name: Jeffrey Fitzgerald MRN: [id number] DOB: [birth year]   Cancelled Treatment:    Reason Eval/Treat Not Completed: Pain limiting ability to participate.  Pt declined participation in OT at this time stating he "just wanted to sit in the chair and watch TV".  Therapist tried to encourage pt to participate as [facility] is following for possible admission, but he still refused stating he wants to go "home" and not to rehab.  Discussed briefly that he will need to learn how to do things different with the new amputation and the recent bypass surgery on the left leg causing him increased pain.  Pt not very receptive at this time, will continue to follow in acute care  Analycia Khokhar OTR/L 04/28/2022, 4:27 PM

## 2022-04-28 NOTE — Progress Notes (Signed)
PROGRESS NOTE/consult    Jeffrey Fitzgerald  DUK:[id number] DOB: May 05, 1971 DOA: 04/01/2022 PCP: Patient, No Pcp Per    No chief complaint on file.   Brief Narrative:   Jeffrey Fitzgerald is a 51 y.o. male with past medical history of hypertension, peripheral vascular disease, and tobacco abuse who presented initially for critical left lower limb ischemia 8/3 requiring aortobifemoral bypass.  He then underwent left common femoral and posterior tibial bypass on 8/9 same-day for right common revision of the right lower extremity aortobifemoral bypass.  He returned to the OR on 8/11 and underwent thrombectomy of left common femoral to posterior tibial artery bypass, right lower extremity fasciotomy closure, and wound VAC placement.  White blood cell count had trended up to 24.8 and the patient has been temporarily treated empirically with Zosyn.  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  His hospital stay had been complicated by hemorrhagic bleed with hemoglobin trending down to 6 g/dL requiring 2 units of packed red blood cells and creatinine trended up to 3.59 before trending down.    He has been complaining of pain out of left leg which was noted to be cold and contracted.  Vascular had discussed need of left above-knee amputation, but he was not willing to undergo amputation of the left leg.  Palliative care had been consulted.  Patient was noted to become more tachycardic and hypertensive with blood pressures elevated into the 200s at times.  He denies any complaints of any chest pain, nausea, vomiting, or diarrhea.  Patient's white blood cell counts have been otherwise trending down.  TRH consulted to help with medical management.   Assessment & Plan:   Principal Problem:   Critical limb ischemia of  both lower extremities (HCC) Active Problems:   PAD (peripheral artery disease) (HCC)   Acute renal failure (HCC)   Other specified anemias   Pressure injury of skin   SIRS (systemic inflammatory response syndrome) (HCC)   Hypertensive urgency   Hypokalemia   Gastroesophageal reflux disease  #1 critical limb ischemia -Patient presented with critical limb ischemia bilateral lower extremities required several attempts at revascularization. -Patient subsequently underwent right AKA on 04/22/2022. -Vascular surgery/primary team recommended left AKA however patient currently declining. -Palliative care consulted and following. -Continue aspirin, statin. -Pain management per primary team. -Per vascular surgery.  2.  SIRS -Patient noted to be tachycardic, tachypneic with a leukocytosis white count of 4.4 with noted to be trending down. -Prior blood cultures done with no growth to date. -Patient noted to have been on Zosyn which has subsequently been discontinued. -UA noted moderate hemoglobin, small leukocytes, rare bacteria, 6-10 WBCs.  Patient with no urinary symptoms. -Right lower extremity wound appears to be healing well. -Patient with no respiratory symptoms. -Urine cultures pending. -Leukocytosis trending down. -Follow.  3.  Hypertensive urgency -Patient noted to have blood pressure elevated 198/86 with heart rates in the 120s. -Patient noted to not be on any antihypertensive medications prior to admission. -Patient noted to have been on hydralazine 10 mg twice daily however did not obtain adequate blood pressure control. -Continue amlodipine 5 mg daily. -Increase Coreg to 6.25 mg twice daily. -Continue hydralazine 10 mg twice daily. -Uptitrate antihypertensive medications for better blood pressure control.  4.  Hypokalemia -Repleted.  Potassium of 4.1.  5.  Acute renal  failure -Baseline creatinine approximately 1-1.2. -Renal function noted to trend up to 3.59 on 04/11/2022  and felt likely secondary to contrast given. -Renal function improving with creatinine currently down to 1.68. -Urine output of 2.6 L over the past 24 hours. -Follow.  6.  Urinary frequency -Patient noted to have urinary frequency without dysuria. -Urine cultures pending.  7.  Tobacco abuse -Tobacco cessation. -Nicotine patch.  8.  GERD -PPI.   DVT prophylaxis: Per primary team Code Status: Full Family Communication: Updated patient.  No family at bedside. Disposition: Per primary team.  Status is: Inpatient Remains inpatient appropriate because: Severity of illness   Consultants:  Cardiology: Dr. Antoine Poche 04/01/2022 Wound care RN, Helmut Muster 04/09/2022 Palliative care: Albesa Seen, NP 04/21/2022 [facility]: Dr. Katrinka Blazing 04/27/2022  Procedures:  Abdominal films 04/07/2022, 04/12/2022 Chest x-ray 04/07/2022, 04/18/2022 Myoview stress test 04/02/2022 2D echo 04/02/2022 Right AKA per vascular surgery: Dr. Myra Gianotti 04/22/2022 Reexploration and washout of right groin wound less than 30 days postop, sharp excisional debridement of right groin wound, repair of aortobifemoral to profunda femoris artery bypass with pledgeted suture, right groin sartorius muscle flap coverage, application negative pressure dressing per vascular surgery: Dr. Randie Heinz 04/20/2022 Right leg fasciotomy washout, soft tissue debridement, right groin washout, soft tissue debridement, VAC placement per vascular surgery: Dr. Sherral Hammers 04/20/2022 Thrombectomy of left common femoral to posterior tibial artery bypass, closure of right medial calf fasciotomy, application of negative pressure wound VAC greater right lateral calf fasciotomy per vascular surgery: Dr. Chestine Spore 04/09/2022 Right greater saphenous vein harvest, right common femoral to posterior tibial artery bypass with 6 mm PTFE/greater saphenous vein composite graft, right lower extremity angiogram, right lower extremity 4 compartment fasciotomy, right posterior tibial  artery thrombectomy per Dr. Lenell Antu, Dr. Chestine Spore vascular surgery 04/08/2022 Redo exploration of the right groin/right common femoral artery thrombectomy/extensive endarterectomy of right profunda with bovine pericardial patch angioplasty, revision of right limb of the aortobifemoral bypass graft 04/07/2022 per Dr. Randie Heinz. Aortobifemoral bypass, right common femoral endarterectomy with profundoplasty, left common femoral endarterectomy with profundoplasty, harvest of left leg great saphenous vein, left common femoral to posterior tibial bypass with composite PTFE and great saphenous vein per vascular surgery: Dr. Chestine Spore 04/07/2022 Aortobifemoral bypass, bilateral common femoral and profunda femoris endarterectomies, harvest left greater saphenous vein, left common femoral to posterior tibial artery bypass with composite 6 mm E PTFE in reverse greater saphenous vein and anatomic tunnel by Dr. Chestine Spore at [facility] 04/07/2022 Attempted ultrasound-guided access left common femoral artery, ultrasound-guided access right common femoral artery, aortogram with catheter selection of the aorta, bilateral lower extremity arteriogram with runoff, 32 minutes monitored moderate conscious sedation time Per vascular surgery: Dr. Chestine Spore 04/01/2022  Antimicrobials:  Anti-infectives (From admission, onward)    Start     Dose/Rate Route Frequency Ordered Stop   04/21/22 1315  vancomycin (VANCOCIN) IVPB 1000 mg/200 mL premix  Status:  Discontinued        1,000 mg 200 mL/hr over 60 Minutes Intravenous Every 24 hours 04/21/22 0933 04/27/22 1020   04/19/22 1315  vancomycin (VANCOREADY) IVPB 750 mg/150 mL  Status:  Discontinued        750 mg 150 mL/hr over 60 Minutes Intravenous Every 24 hours 04/19/22 1218 04/21/22 0933   04/18/22 1000  piperacillin-tazobactam (ZOSYN) IVPB 3.375 g  Status:  Discontinued        3.375 g 12.5 mL/hr over 240 Minutes Intravenous Every 8 hours 04/18/22 0925 04/27/22 1020   04/08/22 2200   linezolid (ZYVOX) IVPB 600  mg        600 mg 300 mL/hr over 60 Minutes Intravenous Every 12 hours 04/08/22 1622 04/15/22 2359   04/08/22 0000  ceFAZolin (ANCEF) IVPB 2g/100 mL premix        2 g 200 mL/hr over 30 Minutes Intravenous Every 8 hours 04/07/22 2316 04/08/22 1559   04/07/22 1529  ceFAZolin (ANCEF) IVPB 2g/100 mL premix  Status:  Discontinued        2 g 200 mL/hr over 30 Minutes Intravenous 30 min pre-op 04/07/22 1530 04/07/22 2005   04/06/22 0932  vancomycin (VANCOREADY) IVPB 750 mg/150 mL  Status:  Discontinued        750 mg 150 mL/hr over 60 Minutes Intravenous Every 12 hours 04/06/22 0903 04/08/22 1634   04/05/22 2200  vancomycin (VANCOREADY) IVPB 1250 mg/250 mL  Status:  Discontinued        1,250 mg 166.7 mL/hr over 90 Minutes Intravenous Every 48 hours 04/04/22 1728 04/05/22 1023   04/05/22 1800  vancomycin (VANCOREADY) IVPB 1250 mg/250 mL  Status:  Discontinued        1,250 mg 166.7 mL/hr over 90 Minutes Intravenous Every 24 hours 04/05/22 1023 04/06/22 0903   04/02/22 2200  vancomycin (VANCOREADY) IVPB 750 mg/150 mL  Status:  Discontinued        750 mg 150 mL/hr over 60 Minutes Intravenous Every 12 hours 04/02/22 0853 04/04/22 1728   04/02/22 1000  piperacillin-tazobactam (ZOSYN) IVPB 3.375 g        3.375 g 12.5 mL/hr over 240 Minutes Intravenous Every 8 hours 04/02/22 0828 04/15/22 2359   04/02/22 1000  vancomycin (VANCOREADY) IVPB 1500 mg/300 mL        1,500 mg 150 mL/hr over 120 Minutes Intravenous  Once 04/02/22 0828 04/02/22 1141         Subjective: Sitting up in bed.  Lunch at bedside.  Denies any chest pain.  No shortness of breath.  Still with complaints of left lower extremity pain.  Stating still does not want left lower extremity amputation and just wants to go home.  Objective: Vitals:   04/28/22 0000 04/28/22 0413 04/28/22 0600 04/28/22 0756  BP: (!) 160/84 (!) 196/97 (!) 162/83 (!) 156/87  Pulse:  (!) 113  99  Resp: 14 19  17   Temp:  98.4 F  (36.9 C)  98.3 F (36.8 C)  TempSrc:  Oral  Oral  SpO2:  100%  100%  Weight:      Height:        Intake/Output Summary (Last 24 hours) at 04/28/2022 1921 Last data filed at 04/28/2022 0847 Gross per 24 hour  Intake 200.33 ml  Output 600 ml  Net -399.67 ml   Filed Weights   04/09/22 0600 04/09/22 0830 04/22/22 0831  Weight: 68.8 kg 68.8 kg 65.8 kg    Examination:  General exam: Appears calm and comfortable  Respiratory system: Clear to auscultation.  No wheezes, no crackles, no rhonchi.  Respiratory effort normal. Cardiovascular system: S1 & S2 heard, RRR. No JVD, murmurs, rubs, gallops or clicks. No pedal edema. Gastrointestinal system: Abdomen is nondistended, soft and nontender. No organomegaly or masses felt. Normal bowel sounds heard. Central nervous system: Alert and oriented. No focal neurological deficits. Extremities: Status post right AKA.  Left lower extremity tenderness on movement, no left lower extremity edema. Skin: No rashes, lesions or ulcers Psychiatry: Judgement and insight appear normal. Mood & affect appropriate.     Data Reviewed: I have personally reviewed following labs  and imaging studies  CBC: Recent Labs  Lab 04/24/22 0337 04/25/22 0204 04/26/22 0602 04/27/22 0212 04/28/22 0604  WBC 17.0* 15.7* 16.1* 14.4* 11.9*  HGB 8.9* 8.7* 9.1* 8.8* 8.6*  HCT 26.6* 26.4* 27.1* 26.5* 25.9*  MCV 89.6 89.2 88.0 88.3 89.0  PLT 250 283 338 367 378    Basic Metabolic Panel: Recent Labs  Lab 04/23/22 0308 04/24/22 0337 04/25/22 0204 04/27/22 0212 04/28/22 0604  NA 136 135 136 137 136  K 3.5 3.5 3.2* 3.3* 4.1  CL 106 106 105 104 106  CO2 21* 21* 23 23 21*  GLUCOSE 104* 116* 122* 101* 98  BUN 18 13 10 10 11   CREATININE 1.86* 1.67* 1.67* 1.60* 1.68*  CALCIUM 7.6* 7.8* 7.7* 8.0* 8.2*  MG  --   --   --   --  2.0    GFR: Estimated Creatinine Clearance: 45.8 mL/min (A) (by C-G formula based on SCr of 1.68 mg/dL (H)).  Liver Function Tests: No  results for input(s): "AST", "ALT", "ALKPHOS", "BILITOT", "PROT", "ALBUMIN" in the last 168 hours.  CBG: No results for input(s): "GLUCAP" in the last 168 hours.   No results found for this or any previous visit (from the past 240 hour(s)).       Radiology Studies: No results found.      Scheduled Meds:  amLODipine  5 mg Oral Daily   aspirin EC  81 mg Oral Daily   atorvastatin  80 mg Oral Daily   carvedilol  6.25 mg Oral BID WC   docusate sodium  100 mg Oral Daily   hydrALAZINE  10 mg Oral BID   nicotine  21 mg Transdermal Daily   pantoprazole  40 mg Oral Daily   sodium chloride flush  3 mL Intravenous Q12H   Continuous Infusions:  sodium chloride 180 mL/hr at 04/20/22 0944   sodium chloride 10 mL/hr at 04/25/22 1616     LOS: 27 days    Time spent: 35 minutes    04/27/22, MD [facility]   To contact the attending provider between 7A-7P or the covering provider during after hours 7P-7A, please log into the web site [email or web address] and access using universal [place] password for that web site. If you do not have the password, please call the hospital operator.  04/28/2022, 7:21 PM

## 2022-04-28 NOTE — Progress Notes (Addendum)
  Progress Note    04/28/2022 7:47 AM 6 Days Post-Op  Subjective:  no complaints this morning.  Contracted L knee   Vitals:   04/28/22 0413 04/28/22 0600  BP: (!) 196/97 (!) 162/83  Pulse: (!) 113   Resp: 19   Temp: 98.4 F (36.9 C)   SpO2: 100%    Physical Exam: Lungs:  non labored Incisions:  R AKA well appearing;  Extremities:  L foot cold to touch and painful to light touch Abdomen:  soft, NT, ND Neurologic: A&O  CBC    Component Value Date/Time   WBC 11.9 (H) 04/28/2022 0604   RBC 2.91 (L) 04/28/2022 0604   HGB 8.6 (L) 04/28/2022 0604   HCT 25.9 (L) 04/28/2022 0604   PLT 378 04/28/2022 0604   MCV 89.0 04/28/2022 0604   MCH 29.6 04/28/2022 0604   MCHC 33.2 04/28/2022 0604   RDW 15.9 (H) 04/28/2022 0604   LYMPHSABS 2.0 04/09/2022 0009   MONOABS 1.7 (H) 04/09/2022 0009   EOSABS 0.0 04/09/2022 0009   BASOSABS 0.0 04/09/2022 0009    BMET    Component Value Date/Time   NA 137 04/27/2022 0212   K 3.3 (L) 04/27/2022 0212   CL 104 04/27/2022 0212   CO2 23 04/27/2022 0212   GLUCOSE 101 (H) 04/27/2022 0212   BUN 10 04/27/2022 0212   CREATININE 1.60 (H) 04/27/2022 0212   CALCIUM 8.0 (L) 04/27/2022 0212   GFRNONAA 52 (L) 04/27/2022 0212   GFRAA >60 01/12/2020 1421    INR    Component Value Date/Time   INR 1.2 04/11/2022 0437     Intake/Output Summary (Last 24 hours) at 04/28/2022 0747 Last data filed at 04/28/2022 0650 Gross per 24 hour  Intake 557.33 ml  Output 2700 ml  Net -2142.67 ml     Assessment/Plan:  51 y.o. male is s/p R AKA with groin washout 6 Days Post-Op   R AKA appears to be healing well R groin vac change with WOC RN this morning L foot painful and cold with contracted L knee; no interest in AKA at this time Palliative will re-evaluate patient later this week Appreciate medical management from [facility]   Emilie Rutter, PA-C [facility] [phone] 04/28/2022 7:47 AM   VASCULAR STAFF ADDENDUM: I have  independently interviewed and examined the patient. I agree with the above.   Rande Brunt. Lenell Antu, MD [facility] Phone Number: [phone] 04/28/2022 2:42 PM

## 2022-04-28 NOTE — Consult Note (Signed)
WOC Nurse wound follow up Wound type:VAC dressing change to right groin Measurement: 12 cm x 5 cm x 2 cm  Wound bed:50% muscle 20% yellow fibrin 30% pale pink nongranulating.  Drainage (amount, consistency, odor) moderate serosanguinous  no odor Periwound: intact Dressing procedure/placement/frequency:1 piece black foam. Covered with drape.  Seal achieved at 75 mmHg per order.  Will follow.  Maple Hudson MSN, RN, FNP-BC CWON Wound, Ostomy, Continence Nurse Pager [phone]

## 2022-04-28 NOTE — Progress Notes (Signed)
Inpatient Rehab Admissions Coordinator:    Note Pt. Worked with PT yesterday but still not at a level where I think he could tolerate the intensity of CIR. I will continue to follow for progress and participation with therapy.   Megan Salon, MS, CCC-SLP Rehab Admissions Coordinator  [phone] (celll) [phone] (office)

## 2022-04-28 NOTE — Progress Notes (Signed)
Mobility Specialist: Progress Note   04/28/22 1506  Mobility  Activity Transferred from bed to chair  Level of Assistance Maximum assist, patient does 25-49%  Assistive Device Stedy  RLE Weight Bearing NWB  Activity Response Tolerated fair  $Mobility charge 1 Mobility   Pt received in the bed and agreeable to mobility. Pt required modA with bed mobility and maxA to stand from EOB. Required verbal cues for hand placement and posture when standing. Pt is in the chair with call bell and phone at his side. Chair alarm is on.   [facility] Alauna Hayden Mobility Specialist Mobility Specialist [address]: [phone]

## 2022-04-29 DIAGNOSIS — K219 Gastro-esophageal reflux disease without esophagitis: Secondary | ICD-10-CM

## 2022-04-29 LAB — CBC WITH DIFFERENTIAL/PLATELET
Abs Immature Granulocytes: 0.05 10*3/uL (ref 0.00–0.07)
Basophils Absolute: 0 10*3/uL (ref 0.0–0.1)
Basophils Relative: 0 %
Eosinophils Absolute: 0.2 10*3/uL (ref 0.0–0.5)
Eosinophils Relative: 2 %
HCT: 26.1 % — ABNORMAL LOW (ref 39.0–52.0)
Hemoglobin: 8.5 g/dL — ABNORMAL LOW (ref 13.0–17.0)
Immature Granulocytes: 1 %
Lymphocytes Relative: 12 %
Lymphs Abs: 1.2 10*3/uL (ref 0.7–4.0)
MCH: 29 pg (ref 26.0–34.0)
MCHC: 32.6 g/dL (ref 30.0–36.0)
MCV: 89.1 fL (ref 80.0–100.0)
Monocytes Absolute: 1.1 10*3/uL — ABNORMAL HIGH (ref 0.1–1.0)
Monocytes Relative: 10 %
Neutro Abs: 7.7 10*3/uL (ref 1.7–7.7)
Neutrophils Relative %: 75 %
Platelets: 407 10*3/uL — ABNORMAL HIGH (ref 150–400)
RBC: 2.93 MIL/uL — ABNORMAL LOW (ref 4.22–5.81)
RDW: 15.6 % — ABNORMAL HIGH (ref 11.5–15.5)
WBC: 10.2 10*3/uL (ref 4.0–10.5)
nRBC: 0 % (ref 0.0–0.2)

## 2022-04-29 LAB — SURGICAL PATHOLOGY

## 2022-04-29 LAB — BASIC METABOLIC PANEL
Anion gap: 7 (ref 5–15)
BUN: 9 mg/dL (ref 6–20)
CO2: 24 mmol/L (ref 22–32)
Calcium: 8.3 mg/dL — ABNORMAL LOW (ref 8.9–10.3)
Chloride: 104 mmol/L (ref 98–111)
Creatinine, Ser: 1.46 mg/dL — ABNORMAL HIGH (ref 0.61–1.24)
GFR, Estimated: 58 mL/min — ABNORMAL LOW (ref >60)
Glucose, Bld: 104 mg/dL — ABNORMAL HIGH (ref 70–99)
Potassium: 3.5 mmol/L (ref 3.5–5.1)
Sodium: 135 mmol/L (ref 135–145)

## 2022-04-29 LAB — GLUCOSE, CAPILLARY
Glucose-Capillary: 98 mg/dL (ref 70–99)
Glucose-Capillary: 111 mg/dL — ABNORMAL HIGH (ref 70–99)

## 2022-04-29 MED ORDER — AMLODIPINE BESYLATE 10 MG PO TABS
10.0000 mg | ORAL_TABLET | Freq: Every day | ORAL | Status: DC
  Administered 2022-04-29 – 2022-05-19 (×21): 10 mg via ORAL
  Filled 2022-04-29 (×21): qty 1

## 2022-04-29 MED ORDER — POTASSIUM CHLORIDE CRYS ER 10 MEQ PO TBCR
40.0000 meq | EXTENDED_RELEASE_TABLET | Freq: Once | ORAL | Status: AC
  Administered 2022-04-29: 40 meq via ORAL
  Filled 2022-04-29: qty 4

## 2022-04-29 NOTE — Progress Notes (Signed)
Palliative:  HPI: 51 y.o. male  with past medical history of peripheral vascular disease, hypertension, and tobacco abuse. He was admitted on 04/01/2022 with critical left lower limb ischemia requiring aortobifem bypass. He then had a left common femoral to posterior tibial bypass on 8/9. Returned to OR the same day for right common femoral thrombectomy and revision of the right lower extremity aortobifem bypass. Returned to OR 8/11 for left common femoral thrombectomy, right lower extremity fasciotomy closure and wound VAC placement. Per vascular surgery progress note 8/23, left leg bypass is likely thrombosed and he will require left AKA if he develops worsening tissue los or ischemic pain of left leg. Palliative Medicine was consulted for goals of care in the setting of scheduled right AKA for 8/24. 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 Palliative asked to revisit as there is need for LLE amputation but patient does not desire another amputation per report.    I called and spoke with sister, Jeffrey Fitzgerald, and discussed with her Jeffrey Fitzgerald's condition with left leg ischemia and need for left leg amputation. Jeffrey Fitzgerald was unaware of this situation and need for amputation until our conversation yesterday. She has work obligations until [employer] today and plans to come speak with Jeffrey Fitzgerald after work today. Jeffrey Fitzgerald has good understanding of expectations and prognosis if he declines amputation. We will meet later today.   I meet today at [facility] bedside with sister/HCPOA, Jeffrey Fitzgerald, and his aunt and mother. They have had a frank conversation regarding his desire for amputation/rehab/return home vs no amputation/comfort/hospice. Jeffrey Fitzgerald has decided to proceed with amputation as he wishes to live and ultimately return back home. Family are unable to care for him at home with hospice at end of life but hoping he can rehab to care some for himself and able to provide assistance at home but not full care. Jeffrey Fitzgerald is insistent that he is okay with amputation now. He  attempts to say that he told me this would be okay earlier but I reminded him that he has told me and multiple people yesterday that he would not consider amputation. I asked him frankly if he is going to feel this same way tomorrow and agree when the doctors come around and assures me that he will.   I spoke more with Jeffrey Fitzgerald who shares that we have to be very direct and blunt conversations with Jeffrey Fitzgerald. She shares that she knows he wants to come home so he can smoke but he knows he needs rehab in order to return home. He was clear in his conversation with me yesterday that he did not want to die but I was unable to have a practical conversation with him as he was fixated on going home. Jeffrey Fitzgerald shares that he also shared that he doesn't like to do therapy because "the therapists talk to much." Cherry prefers that keep thins quick and to the point. If any other confusion or issues Jeffrey Fitzgerald requests that we contact her as she knows how to work with Jeffrey Fitzgerald.   All questions/concerns addressed. Emotional support.   Exam: Alert, oriented. No distress. Breathing regular, unlabored. Abd flat. RLE AKA healing. LLE ischemia.   Plan: - Agrees to L AKA now.  - Plans for rehab with goal to return home.  - Full code, full scope.   55 min  Yong Channel, NP Palliative Medicine Team Pager [phone] (Please see [email or web address] for schedule) Team Phone [phone]    Greater than 50%  of this time was spent counseling and coordinating care related to  the above assessment and plan

## 2022-04-29 NOTE — Progress Notes (Addendum)
  Progress Note    04/29/2022 7:59 AM 7 Days Post-Op  Subjective:  wants to go home   Vitals:   04/29/22 0403 04/29/22 0748  BP: (!) 166/84 (!) 171/90  Pulse: (!) 106 (!) 112  Resp: 20 20  Temp: 98.7 F (37.1 C) 97.9 F (36.6 C)  SpO2:  100%   Physical Exam: Lungs:  non labored Incisions:  R aKA well appearing; LLE incisions healing well; wound vac R groin with good seal Extremities:  L foot cold and painful to touch Neurologic: A&O  CBC    Component Value Date/Time   WBC 10.2 04/29/2022 0237   RBC 2.93 (L) 04/29/2022 0237   HGB 8.5 (L) 04/29/2022 0237   HCT 26.1 (L) 04/29/2022 0237   PLT 407 (H) 04/29/2022 0237   MCV 89.1 04/29/2022 0237   MCH 29.0 04/29/2022 0237   MCHC 32.6 04/29/2022 0237   RDW 15.6 (H) 04/29/2022 0237   LYMPHSABS 1.2 04/29/2022 0237   MONOABS 1.1 (H) 04/29/2022 0237   EOSABS 0.2 04/29/2022 0237   BASOSABS 0.0 04/29/2022 0237    BMET    Component Value Date/Time   NA 135 04/29/2022 0237   K 3.5 04/29/2022 0237   CL 104 04/29/2022 0237   CO2 24 04/29/2022 0237   GLUCOSE 104 (H) 04/29/2022 0237   BUN 9 04/29/2022 0237   CREATININE 1.46 (H) 04/29/2022 0237   CALCIUM 8.3 (L) 04/29/2022 0237   GFRNONAA 58 (L) 04/29/2022 0237   GFRAA >60 01/12/2020 1421    INR    Component Value Date/Time   INR 1.2 04/11/2022 0437     Intake/Output Summary (Last 24 hours) at 04/29/2022 0759 Last data filed at 04/29/2022 0700 Gross per 24 hour  Intake 3 ml  Output 825 ml  Net -822 ml     Assessment/Plan:  51 y.o. male is s/p R groin muscle flap 7 Days Post-Op   L foot ischemic and painful Plans noted for discussion with palliative, patient, and sister this morning Wants to go home; would not consent to L AKA   Emilie Rutter, PA-C [facility] [phone] 04/29/2022 7:59 AM  VASCULAR STAFF ADDENDUM: I have independently interviewed and examined the patient. I agree with the above.   Rande Brunt. Lenell Antu, MD [facility] Phone Number: [phone] 04/29/2022 3:16 PM

## 2022-04-29 NOTE — Progress Notes (Signed)
Physical Therapy Treatment Patient Details Name: Jeffrey Fitzgerald MRN: [id number] DOB: 11/03/1970 Today's Date: 04/29/2022   History of Present Illness Pt is a 51 yo male admitted 04/01/22 from cath lab after aortogram for LLE ischemia. S/p aortobifemoral bypass graft with bilateral femoral endarterectomy on 8/9. Post op RLE ischemia with return to OR same date for redo R groin exploration with thrombectomy and angioplasty. S/p RLE fem-pop bypass graft with 4 compartment fasciotomy on 8/10. S/p thrombectomy left femoral to popliteal BPG wtih closure and right fasciotomy wtih VAC on right LE on 8/11. Ileus with emesis on 8/14. 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 PMH includes HTN, tobacco smoking, PVD.    PT Comments    Pt received OOB in recliner on arrival and agreeable to session with encouragement. In light of pt continuing to state that he wants to go home, session focused on transfer training to<>from personal power chair/scooter and safe power chair operation.  Pt needing max-total assist to squat pivot to<>from chair, substantially increased time spent instructing pt on lateral scoot transfer as pt declining assist initially, with pt unable to power up through UE to complete scoot. Pt accepting of assist with encouragement. Encouraged pt and continued education on benefits of mobility. Pt continues to benefit from skilled PT services to progress toward functional mobility goals.    Recommendations for follow up therapy are one component of a multi-disciplinary discharge planning process, led by the attending physician.  Recommendations may be updated based on patient status, additional functional criteria and insurance authorization.  Follow Up Recommendations  Acute inpatient rehab (3hours/day) Can patient physically be transported by private vehicle: No   Assistance Recommended at Discharge Frequent or constant Supervision/Assistance  Patient can return home with the following A lot of help with walking and/or  transfers;A lot of help with bathing/dressing/bathroom;Assistance with cooking/housework;Direct supervision/assist for medications management;Direct supervision/assist for financial management;Assist for transportation;Help with stairs or ramp for entrance   Equipment Recommendations  Wheelchair (measurements PT);Wheelchair cushion (measurements PT);Other (comment) (sliding board)    Recommendations for Other Services       Precautions / Restrictions Precautions Precautions: Fall;Other (comment) Precaution Comments: watch HR, bowel incontience Restrictions Weight Bearing Restrictions: Yes RLE Weight Bearing: Non weight bearing     Mobility  Bed Mobility Overal bed mobility: Needs Assistance             General bed mobility comments: pt OOB in recliner on arrival    Transfers Overall transfer level: Needs assistance Equipment used: None Transfers: Bed to chair/wheelchair/BSC       Squat pivot transfers: Total assist     General transfer comment: total assist to squat pivot <>power chair, significantly increased time at start for pt to attempt on own as pt stating he did not want assist, willing to accept assist with encouragement    Ambulation/Gait                   Dance movement psychotherapist Wheelchair mobility: Yes Wheelchair propulsion:  (power chair) Wheelchair parts: Independent Distance: 1000 Wheelchair Assistance Details (indicate cue type and reason): pt independent with power chair operation, however needing total assist to trasnfer to<>from  Modified Rankin (Stroke Patients Only)       Balance Overall balance assessment: Needs assistance Sitting-balance support: No upper extremity supported, Feet supported Sitting balance-Leahy Scale: Good Sitting balance - Comments: statically   Standing balance support: Bilateral upper extremity supported Standing balance-Leahy Scale:  Zero Standing balance  comment: relies on BUE and external support; dependent for posterior pericare; unable to achieve fully upright standing (trunk/hip/knee flexion with pain)                            Cognition Arousal/Alertness: Awake/alert Behavior During Therapy: Flat affect Overall Cognitive Status: No family/caregiver present to determine baseline cognitive functioning Area of Impairment: Attention, Following commands, Safety/judgement, Awareness, Problem solving                     Memory: Decreased short-term memory Following Commands: Follows one step commands with increased time, Follows multi-step commands with increased time Safety/Judgement: Decreased awareness of safety, Decreased awareness of deficits   Problem Solving: Slow processing, Decreased initiation, Requires verbal cues, Requires tactile cues          Exercises Other Exercises Other Exercises: pt declinging further LE therex    General Comments General comments (skin integrity, edema, etc.): VSS on RA      Pertinent Vitals/Pain Pain Assessment Pain Assessment: Faces Faces Pain Scale: Hurts little more Pain Location: BLE Pain Descriptors / Indicators: Grimacing Pain Intervention(s): Monitored during session, Limited activity within patient's tolerance    Home Living                          Prior Function            PT Goals (current goals can now be found in the care plan section) Acute Rehab PT Goals Patient Stated Goal: to go home PT Goal Formulation: With patient Time For Goal Achievement: 05/07/22    Frequency    Min 3X/week      PT Plan Current plan remains appropriate    Co-evaluation              AM-PAC PT "6 Clicks" Mobility   Outcome Measure  Help needed turning from your back to your side while in a flat bed without using bedrails?: A Little Help needed moving from lying on your back to sitting on the side of a flat bed without using bedrails?: A  Lot Help needed moving to and from a bed to a chair (including a wheelchair)?: Total Help needed standing up from a chair using your arms (e.g., wheelchair or bedside chair)?: Total Help needed to walk in hospital room?: Total Help needed climbing 3-5 steps with a railing? : Total 6 Click Score: 9    End of Session   Activity Tolerance: Patient tolerated treatment well;Patient limited by fatigue Patient left: with call bell/phone within reach;in chair;with chair alarm set Nurse Communication: Mobility status PT Visit Diagnosis: Other abnormalities of gait and mobility (R26.89);Difficulty in walking, not elsewhere classified (R26.2);Muscle weakness (generalized) (M62.81)     Time: 9892-1194 PT Time Calculation (min) (ACUTE ONLY): 31 min  Charges:  $Therapeutic Activity: 23-37 mins                    Alrick Cubbage R. PTA Acute Rehabilitation Services Office: [phone]   Catalina Antigua 04/29/2022, 1:21 PM

## 2022-04-29 NOTE — Progress Notes (Signed)
Occupational Therapy Treatment Patient Details Name: Jeffrey Fitzgerald MRN: [id number] DOB: [birth year] Today's Date: 04/29/2022   History of present illness Pt is a 51 yo male admitted 04/01/22 from cath lab after aortogram for LLE ischemia. S/p aortobifemoral bypass graft with bilateral femoral endarterectomy on 8/9. Post op RLE ischemia with return to OR same date for redo R groin exploration with thrombectomy and angioplasty. S/p RLE fem-pop bypass graft with 4 compartment fasciotomy on 8/10. S/p thrombectomy left femoral to popliteal BPG wtih closure and right fasciotomy wtih VAC on right LE on 8/11. Ileus with emesis on 8/14. 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 PMH includes HTN, tobacco smoking, PVD.   OT comments  Pt completed bathing in supine rolling secondary to bowel incontinence. Overall min assist for UB dressing with max assist for LB bathing and dressing.  Pt with decreased motivation and initiation to complete tasks overall.  Needs increased time and rest breaks for minimal activity secondary to pain and fatigue.  Feel at this time based on endurance and participation pt will likely not tolerate AIR level therapies and would likely better tolerate SNF level.  Will continue to follow for acute care OT needs at this time.      Recommendations for follow up therapy are one component of a multi-disciplinary discharge planning process, led by the attending physician.  Recommendations may be updated based on patient status, additional functional criteria and insurance authorization.    Follow Up Recommendations  Skilled nursing-short term rehab (<3 hours/day)    Assistance Recommended at Discharge Frequent or constant Supervision/Assistance  Patient can return home with the following  Two people to help with walking and/or transfers;A lot of help with bathing/dressing/bathroom;Direct supervision/assist for medications management;Assist for transportation;Help with stairs or ramp for entrance   Equipment  Recommendations  Other (comment) (TBD next venue of care)       Precautions / Restrictions Precautions Precautions: Fall Precaution Comments: watch HR, bowel incontience Restrictions Weight Bearing Restrictions: Yes RLE Weight Bearing: Non weight bearing       Mobility Bed Mobility Overal bed mobility: Needs Assistance Bed Mobility: Supine to Sit     Supine to sit: Mod assist          Transfers Overall transfer level: Needs assistance Equipment used: None Transfers: Bed to chair/wheelchair/BSC     Squat pivot transfers: Total assist             Balance Overall balance assessment: Needs assistance Sitting-balance support: No upper extremity supported, Feet supported Sitting balance-Leahy Scale: Good                                     ADL either performed or assessed with clinical judgement   ADL Overall ADL's : Needs assistance/impaired     Grooming: Wash/dry face;Set up;Bed level   Upper Body Bathing: Set up;Bed level   Lower Body Bathing: Total assistance;Bed level Lower Body Bathing Details (indicate cue type and reason): supine rolling secondary to bowel incontinence Upper Body Dressing : Minimal assistance;Sitting Upper Body Dressing Details (indicate cue type and reason): pullover shirt Lower Body Dressing: Total assistance;Bed level   Toilet Transfer: Squat-pivot;Total assistance Toilet Transfer Details (indicate cue type and reason): simulated to bedside recliner secondary to pt declining need to toilet and already having BM in the bed. Toileting- Clothing Manipulation and Hygiene: Total assistance;Bed level       Functional mobility during ADLs: Total assistance (squat pivot  transfer to the recliner) General ADL Comments: Pt worked on bathing and dressing supine mostly secondary to bowel incontinence and needing nursing to place foam dressing on his buttocks.  He then needed mod assist for transfer from supine to sit EOB and  max assist to scoot to the edge.  Increased time needed in between all movements secondary to fatigue/pain.               Cognition Arousal/Alertness: Awake/alert Behavior During Therapy: Flat affect Overall Cognitive Status: No family/caregiver present to determine baseline cognitive functioning Area of Impairment: Following commands, Problem solving, Awareness                   Current Attention Level: Focused Memory: Decreased short-term memory Following Commands: Follows one step commands with increased time, Follows multi-step commands with increased time Safety/Judgement: Decreased awareness of safety, Decreased awareness of deficits Awareness: Intellectual Problem Solving: Requires tactile cues, Requires verbal cues, Decreased initiation, Slow processing General Comments: Pt needs increased time and mod to max instructional/demonstrational cueing to initiate tasks.              General Comments VSS on RA    Pertinent Vitals/ Pain       Pain Assessment Pain Assessment: Faces Faces Pain Scale: Hurts a little bit Pain Location: LLE Pain Descriptors / Indicators: Discomfort Pain Intervention(s): Limited activity within patient's tolerance, Repositioned         Frequency  Min 2X/week        Progress Toward Goals  OT Goals(current goals can now be found in the care plan section)  Progress towards OT goals: Progressing toward goals  Acute Rehab OT Goals Patient Stated Goal: To get washed up OT Goal Formulation: With patient Time For Goal Achievement: 05/07/22 Potential to Achieve Goals: Fair  Plan Frequency remains appropriate;Discharge plan needs to be updated       AM-PAC OT "6 Clicks" Daily Activity     Outcome Measure   Help from another person eating meals?: None Help from another person taking care of personal grooming?: A Little Help from another person toileting, which includes using toliet, bedpan, or urinal?: Total Help from another  person bathing (including washing, rinsing, drying)?: A Lot Help from another person to put on and taking off regular upper body clothing?: A Little Help from another person to put on and taking off regular lower body clothing?: Total 6 Click Score: 14    End of Session    OT Visit Diagnosis: Unsteadiness on feet (R26.81);Other abnormalities of gait and mobility (R26.89);Muscle weakness (generalized) (M62.81);Other symptoms and signs involving cognitive function;Pain Pain - Right/Left: Left Pain - part of body: Leg   Activity Tolerance Patient limited by fatigue   Patient Left in chair;with call bell/phone within reach;with chair alarm set   Nurse Communication Mobility status        Time: 9811-9147 OT Time Calculation (min): 49 min  Charges: OT General Charges $OT Visit: 1 Visit OT Treatments $Self Care/Home Management : 38-52 mins  Jeffrey Fitzgerald OTR/L 04/29/2022, 2:32 PM

## 2022-04-29 NOTE — Progress Notes (Signed)
Patient had yellow MEWS, PT, OT working with this patient, VSS rechecked, charge nurse aware, no changes on his status and no acute distress noticed.  04/29/22 1400  Assess: MEWS Score  Temp 98.1 F (36.7 C)  BP (!) 129/93  MAP (mmHg) 106  Pulse Rate (!) 121  ECG Heart Rate (!) 119  Resp 15  SpO2 97 %  O2 Device Room Air  Assess: MEWS Score  MEWS Temp 0  MEWS Systolic 0  MEWS Pulse 2  MEWS RR 0  MEWS LOC 0  MEWS Score 2  MEWS Score Color Yellow  Assess: if the MEWS score is Yellow or Red  Were vital signs taken at a resting state? No  Focused Assessment No change from prior assessment  Does the patient meet 2 or more of the SIRS criteria? No  Does the patient have a confirmed or suspected source of infection? No  Provider and Rapid Response Notified? No  MEWS guidelines implemented *See Row Information* No, vital signs rechecked  Treat  MEWS Interventions Other (Comment) (Recheck VS, patient doing working with PT OT)  Pain Scale 0-10  Pain Score 0  Take Vital Signs  Increase Vital Sign Frequency  Yellow: Q 2hr X 2 then Q 4hr X 2, if remains yellow, continue Q 4hrs  Notify: Charge Nurse/RN  Name of Charge Nurse/RN Notified Angie RN  Date Charge Nurse/RN Notified 04/29/22  Time Charge Nurse/RN Notified 1630  Assess: SIRS CRITERIA  SIRS Temperature  0  SIRS Pulse 1  SIRS Respirations  0  SIRS WBC 1  SIRS Score Sum  2

## 2022-04-29 NOTE — Progress Notes (Signed)
PROGRESS NOTE/consult    Jeffrey Fitzgerald  JJO:[id number] DOB: [birth year] DOA: 04/01/2022 PCP: Patient, No Pcp Per    No chief complaint on file.   Brief Narrative:   Ray Gervasi is a 51 y.o. male with past medical history of hypertension, peripheral vascular disease, and tobacco abuse who presented initially for critical left lower limb ischemia 8/3 requiring aortobifemoral bypass.  He then underwent left common femoral and posterior tibial bypass on 8/9 same-day for right common revision of the right lower extremity aortobifemoral bypass.  He returned to the OR on 8/11 and underwent thrombectomy of left common femoral to posterior tibial artery bypass, right lower extremity fasciotomy closure, and wound VAC placement.  White blood cell count had trended up to 24.8 and the patient has been temporarily treated empirically with Zosyn.  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  His hospital stay had been complicated by hemorrhagic bleed with hemoglobin trending down to 6 g/dL requiring 2 units of packed red blood cells and creatinine trended up to 3.59 before trending down.    He has been complaining of pain out of left leg which was noted to be cold and contracted.  Vascular had discussed need of left above-knee amputation, but he was not willing to undergo amputation of the left leg.  Palliative care had been consulted.  Patient was noted to become more tachycardic and hypertensive with blood pressures elevated into the 200s at times.  He denies any complaints of any chest pain, nausea, vomiting, or diarrhea.  Patient's white blood cell counts have been otherwise trending down.  TR[facility] consulted to help with medical management.   Assessment & Plan:   Principal Problem:   Critical limb ischemia of  both lower extremities (HCC) Active Problems:   PAD (peripheral artery disease) (HCC)   Acute renal failure (HCC)   Other specified anemias   Pressure injury of skin   SIRS (systemic inflammatory response syndrome) (HCC)   Hypertensive urgency   Hypokalemia   Gastroesophageal reflux disease  #1 critical limb ischemia -Patient presented with critical limb ischemia bilateral lower extremities required several attempts at revascularization. -Patient subsequently underwent right AKA on 04/22/2022. -Vascular surgery/primary team recommended left AKA however patient currently declining. -Palliative care consulted and following. -Continue aspirin, statin. -Pain management per primary team. -Per vascular surgery.  2.  SIRS -Patient noted to be tachycardic, tachypneic with a leukocytosis white count of 4.4 with noted to be trending down. -Prior blood cultures done with no growth to date. -Patient noted to have been on Zosyn which has subsequently been discontinued. -UA noted moderate hemoglobin, small leukocytes, rare bacteria, 6-10 WBCs.  Patient with no urinary symptoms. -Right lower extremity wound appears to be healing well. -Patient with no respiratory symptoms. -Urine cultures pending. -Leukocytosis trending down. -Follow.  3.  Hypertensive urgency -Patient noted to have blood pressure elevated 198/86 with heart rates in the 120s. -Patient noted to not be on any antihypertensive medications prior to admission. -Patient noted to have been on hydralazine 10 mg twice daily however did not obtain adequate blood pressure control. -Continue Coreg 6.25 mg twice daily.   -Increase Norvasc to 10 mg daily for better blood pressure control. -Continue hydralazine 10 mg twice daily. -Uptitrate antihypertensive medications for better blood pressure control if needed.  4.  Hypokalemia -Repleted.  Potassium of 3.5. -K-Dur 40 mEq p.o. x1.  5.  Acute renal failure -Baseline creatinine  approximately 1-1.2. -Renal function noted to trend up to 3.59 on 04/11/2022 and felt likely secondary to contrast given. -Renal function improving with creatinine currently down to 1.46. -Urine output of 700 cc recorded over the past 24 hours. -Follow.  6.  Urinary frequency -Patient noted to have urinary frequency without dysuria. -Urine cultures pending.  7.  Tobacco abuse -Tobacco cessation. -Nicotine patch.  8.  GERD -Continue PPI.   DVT prophylaxis: Per primary team Code Status: Full Family Communication: Updated patient.  No family at bedside. Disposition: Per primary team.  Status is: Inpatient Remains inpatient appropriate because: Severity of illness   Consultants:  Cardiology: Dr. Percival Spanish 04/01/2022 Wound care RN, Val Riles 04/09/2022 Palliative care: Adam Phenix, NP 04/21/2022 [facility]: Dr. Tamala Julian 04/27/2022  Procedures:  Abdominal films 04/07/2022, 04/12/2022 Chest x-ray 04/07/2022, 04/18/2022 Myoview stress test 04/02/2022 2D echo 04/02/2022 Right AKA per vascular surgery: Dr. Trula Slade 04/22/2022 Reexploration and washout of right groin wound less than 30 days postop, sharp excisional debridement of right groin wound, repair of aortobifemoral to profunda femoris artery bypass with pledgeted suture, right groin sartorius muscle flap coverage, application negative pressure dressing per vascular surgery: Dr. Donzetta Matters 04/20/2022 Right leg fasciotomy washout, soft tissue debridement, right groin washout, soft tissue debridement, VAC placement per vascular surgery: Dr. Unk Lightning 04/20/2022 Thrombectomy of left common femoral to posterior tibial artery bypass, closure of right medial calf fasciotomy, application of negative pressure wound VAC greater right lateral calf fasciotomy per vascular surgery: Dr. Carlis Abbott 04/09/2022 Right greater saphenous vein harvest, right common femoral to posterior tibial artery bypass with 6 mm PTFE/greater saphenous vein composite graft, right lower  extremity angiogram, right lower extremity 4 compartment fasciotomy, right posterior tibial artery thrombectomy per Dr. Stanford Breed, Dr. Carlis Abbott vascular surgery 04/08/2022 Redo exploration of the right groin/right common femoral artery thrombectomy/extensive endarterectomy of right profunda with bovine pericardial patch angioplasty, revision of right limb of the aortobifemoral bypass graft 04/07/2022 per Dr. Donzetta Matters. Aortobifemoral bypass, right common femoral endarterectomy with profundoplasty, left common femoral endarterectomy with profundoplasty, harvest of left leg great saphenous vein, left common femoral to posterior tibial bypass with composite PTFE and great saphenous vein per vascular surgery: Dr. Carlis Abbott 04/07/2022 Aortobifemoral bypass, bilateral common femoral and profunda femoris endarterectomies, harvest left greater saphenous vein, left common femoral to posterior tibial artery bypass with composite 6 mm E PTFE in reverse greater saphenous vein and anatomic tunnel by Dr. Carlis Abbott at [place] [facility] 04/07/2022 Attempted ultrasound-guided access left common femoral artery, ultrasound-guided access right common femoral artery, aortogram with catheter selection of the aorta, bilateral lower extremity arteriogram with runoff, 32 minutes monitored moderate conscious sedation time Per vascular surgery: Dr. Carlis Abbott 04/01/2022  Antimicrobials:  Anti-infectives (From admission, onward)    Start     Dose/Rate Route Frequency Ordered Stop   04/21/22 1315  vancomycin (VANCOCIN) IVPB 1000 mg/200 mL premix  Status:  Discontinued        1,000 mg 200 mL/hr over 60 Minutes Intravenous Every 24 hours 04/21/22 0933 04/27/22 1020   04/19/22 1315  vancomycin (VANCOREADY) IVPB 750 mg/150 mL  Status:  Discontinued        750 mg 150 mL/hr over 60 Minutes Intravenous Every 24 hours 04/19/22 1218 04/21/22 0933   04/18/22 1000  piperacillin-tazobactam (ZOSYN) IVPB 3.375 g  Status:  Discontinued        3.375 g 12.5 mL/hr over  240 Minutes Intravenous Every  8 hours 04/18/22 0925 04/27/22 1020   04/08/22 2200  linezolid (ZYVOX) IVPB 600 mg        600 mg 300 mL/hr over 60 Minutes Intravenous Every 12 hours 04/08/22 1622 04/15/22 2359   04/08/22 0000  ceFAZolin (ANCEF) IVPB 2g/100 mL premix        2 g 200 mL/hr over 30 Minutes Intravenous Every 8 hours 04/07/22 2316 04/08/22 1559   04/07/22 1529  ceFAZolin (ANCEF) IVPB 2g/100 mL premix  Status:  Discontinued        2 g 200 mL/hr over 30 Minutes Intravenous 30 min pre-op 04/07/22 1530 04/07/22 2005   04/06/22 0932  vancomycin (VANCOREADY) IVPB 750 mg/150 mL  Status:  Discontinued        750 mg 150 mL/hr over 60 Minutes Intravenous Every 12 hours 04/06/22 0903 04/08/22 1634   04/05/22 2200  vancomycin (VANCOREADY) IVPB 1250 mg/250 mL  Status:  Discontinued        1,250 mg 166.7 mL/hr over 90 Minutes Intravenous Every 48 hours 04/04/22 1728 04/05/22 1023   04/05/22 1800  vancomycin (VANCOREADY) IVPB 1250 mg/250 mL  Status:  Discontinued        1,250 mg 166.7 mL/hr over 90 Minutes Intravenous Every 24 hours 04/05/22 1023 04/06/22 0903   04/02/22 2200  vancomycin (VANCOREADY) IVPB 750 mg/150 mL  Status:  Discontinued        750 mg 150 mL/hr over 60 Minutes Intravenous Every 12 hours 04/02/22 0853 04/04/22 1728   04/02/22 1000  piperacillin-tazobactam (ZOSYN) IVPB 3.375 g        3.375 g 12.5 mL/hr over 240 Minutes Intravenous Every 8 hours 04/02/22 0828 04/15/22 2359   04/02/22 1000  vancomycin (VANCOREADY) IVPB 1500 mg/300 mL        1,500 mg 150 mL/hr over 120 Minutes Intravenous  Once 04/02/22 0828 04/02/22 1141         Subjective: Patient sitting up in chair.  On his telephone.  Denies any chest pain or shortness of breath.  States left lower extremity pain slowly improving.  Lunch at bedside.   Objective: Vitals:   04/28/22 2335 04/29/22 0340 04/29/22 0403 04/29/22 0748  BP: (!) 160/80 (!) 162/84 (!) 166/84 (!) 171/90  Pulse: 99 (!) 108 (!) 106 (!) 112   Resp: 18 (!) 22 20 20   Temp: 98.7 F (37.1 C) 98.7 F (37.1 C) 98.7 F (37.1 C) 97.9 F (36.6 C)  TempSrc: Oral Oral Oral Oral  SpO2: 100% 93%  100%  Weight:      Height:        Intake/Output Summary (Last 24 hours) at 04/29/2022 1302 Last data filed at 04/29/2022 1100 Gross per 24 hour  Intake --  Output 1225 ml  Net -1225 ml    Filed Weights   04/09/22 0600 04/09/22 0830 04/22/22 0831  Weight: 68.8 kg 68.8 kg 65.8 kg    Examination:  General exam: Appears calm and comfortable  Respiratory system: CTA B.  No wheezes, no crackles, no rhonchi.  Normal respiratory effort.   Cardiovascular system: Tachycardia.  No murmurs rubs or gallops.  No JVD.  No lower extremity edema.   Gastrointestinal system: Abdomen is soft, nontender, nondistended, positive bowel sounds.   Central nervous system: Alert and oriented. No focal neurological deficits. Extremities: Status post right AKA.  Left lower extremity tenderness on movement, no left lower extremity edema. Skin: No rashes, lesions or ulcers Psychiatry: Judgement and insight appear normal. Mood & affect appropriate.  Data Reviewed: I have personally reviewed following labs and imaging studies  CBC: Recent Labs  Lab 04/25/22 0204 04/26/22 0602 04/27/22 0212 04/28/22 0604 04/29/22 0237  WBC 15.7* 16.1* 14.4* 11.9* 10.2  NEUTROABS  --   --   --   --  7.7  HGB 8.7* 9.1* 8.8* 8.6* 8.5*  HCT 26.4* 27.1* 26.5* 25.9* 26.1*  MCV 89.2 88.0 88.3 89.0 89.1  PLT 283 338 367 378 407*     Basic Metabolic Panel: Recent Labs  Lab 04/24/22 0337 04/25/22 0204 04/27/22 0212 04/28/22 0604 04/29/22 0237  NA 135 136 137 136 135  K 3.5 3.2* 3.3* 4.1 3.5  CL 106 105 104 106 104  CO2 21* 23 23 21* 24  GLUCOSE 116* 122* 101* 98 104*  BUN 13 10 10 11 9   CREATININE 1.67* 1.67* 1.60* 1.68* 1.46*  CALCIUM 7.8* 7.7* 8.0* 8.2* 8.3*  MG  --   --   --  2.0  --      GFR: Estimated Creatinine Clearance: 52.7 mL/min (A) (by C-G  formula based on SCr of 1.46 mg/dL (H)).  Liver Function Tests: No results for input(s): "AST", "ALT", "ALKPHOS", "BILITOT", "PROT", "ALBUMIN" in the last 168 hours.  CBG: No results for input(s): "GLUCAP" in the last 168 hours.   No results found for this or any previous visit (from the past 240 hour(s)).       Radiology Studies: No results found.      Scheduled Meds:  amLODipine  10 mg Oral Daily   aspirin EC  81 mg Oral Daily   atorvastatin  80 mg Oral Daily   carvedilol  6.25 mg Oral BID WC   docusate sodium  100 mg Oral Daily   hydrALAZINE  10 mg Oral BID   nicotine  21 mg Transdermal Daily   pantoprazole  40 mg Oral Daily   sodium chloride flush  3 mL Intravenous Q12H   Continuous Infusions:  sodium chloride 180 mL/hr at 04/20/22 0944   sodium chloride 10 mL/hr at 04/25/22 1616     LOS: 28 days    Time spent: 35 minutes    04/27/22, MD [facility]   To contact the attending provider between 7A-7P or the covering provider during after hours 7P-7A, please log into the web site [email or web address] and access using universal [place] password for that web site. If you do not have the password, please call the hospital operator.  04/29/2022, 1:02 PM

## 2022-04-30 ENCOUNTER — Other Ambulatory Visit: Payer: Self-pay

## 2022-04-30 ENCOUNTER — Inpatient Hospital Stay (HOSPITAL_COMMUNITY): Payer: Medicaid Other | Admitting: Certified Registered Nurse Anesthetist

## 2022-04-30 ENCOUNTER — Encounter (HOSPITAL_COMMUNITY)
Admission: RE | Disposition: A | Discharge status: Home Health | Payer: Self-pay | Source: Home / Self Care | Attending: Vascular Surgery

## 2022-04-30 ENCOUNTER — Encounter (HOSPITAL_COMMUNITY): Payer: Self-pay | Admitting: Vascular Surgery

## 2022-04-30 DIAGNOSIS — I998 Other disorder of circulatory system: Secondary | ICD-10-CM

## 2022-04-30 DIAGNOSIS — F17210 Nicotine dependence, cigarettes, uncomplicated: Secondary | ICD-10-CM

## 2022-04-30 DIAGNOSIS — T82898A Other specified complication of vascular prosthetic devices, implants and grafts, initial encounter: Secondary | ICD-10-CM

## 2022-04-30 DIAGNOSIS — D649 Anemia, unspecified: Secondary | ICD-10-CM

## 2022-04-30 DIAGNOSIS — I10 Essential (primary) hypertension: Secondary | ICD-10-CM

## 2022-04-30 HISTORY — PX: AMPUTATION: SHX166

## 2022-04-30 LAB — CBC WITH DIFFERENTIAL/PLATELET
Abs Immature Granulocytes: 0.11 10*3/uL — ABNORMAL HIGH (ref 0.00–0.07)
Basophils Absolute: 0.1 10*3/uL (ref 0.0–0.1)
Basophils Relative: 0 %
Eosinophils Absolute: 0.1 10*3/uL (ref 0.0–0.5)
Eosinophils Relative: 0 %
HCT: 27 % — ABNORMAL LOW (ref 39.0–52.0)
Hemoglobin: 9.1 g/dL — ABNORMAL LOW (ref 13.0–17.0)
Immature Granulocytes: 1 %
Lymphocytes Relative: 7 %
Lymphs Abs: 1 10*3/uL (ref 0.7–4.0)
MCH: 29.7 pg (ref 26.0–34.0)
MCHC: 33.7 g/dL (ref 30.0–36.0)
MCV: 88.2 fL (ref 80.0–100.0)
Monocytes Absolute: 0.6 10*3/uL (ref 0.1–1.0)
Monocytes Relative: 4 %
Neutro Abs: 13.8 10*3/uL — ABNORMAL HIGH (ref 1.7–7.7)
Neutrophils Relative %: 88 %
Platelets: 430 10*3/uL — ABNORMAL HIGH (ref 150–400)
RBC: 3.06 MIL/uL — ABNORMAL LOW (ref 4.22–5.81)
RDW: 15.8 % — ABNORMAL HIGH (ref 11.5–15.5)
WBC: 15.6 10*3/uL — ABNORMAL HIGH (ref 4.0–10.5)
nRBC: 0 % (ref 0.0–0.2)

## 2022-04-30 LAB — BASIC METABOLIC PANEL
Anion gap: 15 (ref 5–15)
BUN: 12 mg/dL (ref 6–20)
CO2: 21 mmol/L — ABNORMAL LOW (ref 22–32)
Calcium: 8.8 mg/dL — ABNORMAL LOW (ref 8.9–10.3)
Chloride: 101 mmol/L (ref 98–111)
Creatinine, Ser: 1.72 mg/dL — ABNORMAL HIGH (ref 0.61–1.24)
GFR, Estimated: 48 mL/min — ABNORMAL LOW (ref >60)
Glucose, Bld: 120 mg/dL — ABNORMAL HIGH (ref 70–99)
Potassium: 4.6 mmol/L (ref 3.5–5.1)
Sodium: 137 mmol/L (ref 135–145)

## 2022-04-30 LAB — GLUCOSE, CAPILLARY
Glucose-Capillary: 113 mg/dL — ABNORMAL HIGH (ref 70–99)
Glucose-Capillary: 119 mg/dL — ABNORMAL HIGH (ref 70–99)
Glucose-Capillary: 157 mg/dL — ABNORMAL HIGH (ref 70–99)

## 2022-04-30 LAB — MAGNESIUM: Magnesium: 2 mg/dL (ref 1.7–2.4)

## 2022-04-30 SURGERY — AMPUTATION, ABOVE KNEE
Anesthesia: General | Site: Knee | Laterality: Left

## 2022-04-30 MED ORDER — FENTANYL CITRATE (PF) 250 MCG/5ML IJ SOLN
INTRAMUSCULAR | Status: AC
  Filled 2022-04-30: qty 5

## 2022-04-30 MED ORDER — PROPOFOL 10 MG/ML IV BOLUS
INTRAVENOUS | Status: AC
  Filled 2022-04-30: qty 20

## 2022-04-30 MED ORDER — ACETAMINOPHEN 10 MG/ML IV SOLN
INTRAVENOUS | Status: DC | PRN
  Administered 2022-04-30: 1000 mg via INTRAVENOUS

## 2022-04-30 MED ORDER — DEXAMETHASONE SODIUM PHOSPHATE 10 MG/ML IJ SOLN
INTRAMUSCULAR | Status: DC | PRN
  Administered 2022-04-30: 5 mg via INTRAVENOUS

## 2022-04-30 MED ORDER — ORAL CARE MOUTH RINSE
15.0000 mL | Freq: Once | OROMUCOSAL | Status: AC
Start: 2022-04-30 — End: 2022-04-30

## 2022-04-30 MED ORDER — ONDANSETRON HCL 4 MG/2ML IJ SOLN
INTRAMUSCULAR | Status: DC | PRN
  Administered 2022-04-30: 4 mg via INTRAVENOUS

## 2022-04-30 MED ORDER — LIDOCAINE 2% (20 MG/ML) 5 ML SYRINGE
INTRAMUSCULAR | Status: DC | PRN
  Administered 2022-04-30: 60 mg via INTRAVENOUS

## 2022-04-30 MED ORDER — METOPROLOL TARTRATE 5 MG/5ML IV SOLN
INTRAVENOUS | Status: AC
  Filled 2022-04-30: qty 5

## 2022-04-30 MED ORDER — MIDAZOLAM HCL 2 MG/2ML IJ SOLN
INTRAMUSCULAR | Status: AC
Start: 2022-04-30 — End: ?
  Filled 2022-04-30: qty 2

## 2022-04-30 MED ORDER — KETAMINE HCL 50 MG/5ML IJ SOSY
PREFILLED_SYRINGE | INTRAMUSCULAR | Status: AC
  Filled 2022-04-30: qty 5

## 2022-04-30 MED ORDER — MIDAZOLAM HCL 2 MG/2ML IJ SOLN
INTRAMUSCULAR | Status: AC
  Filled 2022-04-30: qty 2

## 2022-04-30 MED ORDER — CEFAZOLIN SODIUM-DEXTROSE 2-4 GM/100ML-% IV SOLN
INTRAVENOUS | Status: AC
Start: 2022-04-30 — End: ?
  Filled 2022-04-30: qty 100

## 2022-04-30 MED ORDER — KETAMINE HCL 10 MG/ML IJ SOLN
INTRAMUSCULAR | Status: DC | PRN
  Administered 2022-04-30: 20 mg via INTRAVENOUS

## 2022-04-30 MED ORDER — CHLORHEXIDINE GLUCONATE 0.12 % MT SOLN: 15.0000 mL | Freq: Once | OROMUCOSAL | Status: AC

## 2022-04-30 MED ORDER — PROPOFOL 10 MG/ML IV BOLUS
INTRAVENOUS | Status: DC | PRN
  Administered 2022-04-30: 100 mg via INTRAVENOUS

## 2022-04-30 MED ORDER — CHLORHEXIDINE GLUCONATE 0.12 % MT SOLN
OROMUCOSAL | Status: AC
  Administered 2022-04-30: 15 mL via OROMUCOSAL
  Filled 2022-04-30: qty 15

## 2022-04-30 MED ORDER — LIDOCAINE 2% (20 MG/ML) 5 ML SYRINGE
INTRAMUSCULAR | Status: AC
Start: 2022-04-30 — End: ?
  Filled 2022-04-30: qty 5

## 2022-04-30 MED ORDER — FENTANYL CITRATE (PF) 100 MCG/2ML IJ SOLN
INTRAMUSCULAR | Status: AC
  Filled 2022-04-30: qty 2

## 2022-04-30 MED ORDER — ACETAMINOPHEN 10 MG/ML IV SOLN
INTRAVENOUS | Status: AC
Start: 2022-04-30 — End: ?
  Filled 2022-04-30: qty 100

## 2022-04-30 MED ORDER — 0.9 % SODIUM CHLORIDE (POUR BTL) OPTIME
TOPICAL | Status: DC | PRN
  Administered 2022-04-30: 1000 mL

## 2022-04-30 MED ORDER — MIDAZOLAM HCL 2 MG/2ML IJ SOLN
INTRAMUSCULAR | Status: DC | PRN
  Administered 2022-04-30 (×2): 1 mg via INTRAVENOUS

## 2022-04-30 MED ORDER — FENTANYL CITRATE (PF) 250 MCG/5ML IJ SOLN
INTRAMUSCULAR | Status: DC | PRN
  Administered 2022-04-30 (×2): 50 ug via INTRAVENOUS

## 2022-04-30 MED ORDER — SODIUM CHLORIDE 0.9 % IV SOLN
INTRAVENOUS | Status: DC
Start: 2022-04-30 — End: 2022-05-01

## 2022-04-30 MED ORDER — LACTATED RINGERS IV SOLN: INTRAVENOUS | Status: DC

## 2022-04-30 SURGICAL SUPPLY — 51 items
BAG COUNTER SPONGE SURGICOUNT (BAG) ×1 IMPLANT
BLADE SAW GIGLI 510 (BLADE) ×1 IMPLANT
BNDG COHESIVE 6X5 TAN STRL LF (GAUZE/BANDAGES/DRESSINGS) ×1 IMPLANT
BNDG ELASTIC 4X5.8 VLCR STR LF (GAUZE/BANDAGES/DRESSINGS) ×1 IMPLANT
BNDG ELASTIC 6X5.8 VLCR STR LF (GAUZE/BANDAGES/DRESSINGS) ×1 IMPLANT
BNDG GAUZE DERMACEA FLUFF 4 (GAUZE/BANDAGES/DRESSINGS) ×1 IMPLANT
BNDG GAUZE ELAST 4 BULKY (GAUZE/BANDAGES/DRESSINGS) IMPLANT
CANISTER SUCT 3000ML PPV (MISCELLANEOUS) ×1 IMPLANT
CLIP LIGATING EXTRA MED SLVR (CLIP) ×1 IMPLANT
CLIP LIGATING EXTRA SM BLUE (MISCELLANEOUS) ×1 IMPLANT
COVER SURGICAL LIGHT HANDLE (MISCELLANEOUS) ×1 IMPLANT
DRAIN CHANNEL 19F RND (DRAIN) IMPLANT
DRAPE DERMATAC (DRAPES) IMPLANT
DRAPE HALF SHEET 40X57 (DRAPES) ×1 IMPLANT
DRAPE INCISE IOBAN 66X45 STRL (DRAPES) IMPLANT
DRAPE ORTHO SPLIT 77X108 STRL (DRAPES) ×2
DRAPE SURG ORHT 6 SPLT 77X108 (DRAPES) ×2 IMPLANT
DRESSING PREVENA PLUS CUSTOM (GAUZE/BANDAGES/DRESSINGS) IMPLANT
DRSG ADAPTIC 3X8 NADH LF (GAUZE/BANDAGES/DRESSINGS) ×1 IMPLANT
DRSG PREVENA PLUS CUSTOM (GAUZE/BANDAGES/DRESSINGS)
ELECT CAUTERY BLADE 6.4 (BLADE) ×1 IMPLANT
ELECT REM PT RETURN 9FT ADLT (ELECTROSURGICAL) ×1
ELECTRODE REM PT RTRN 9FT ADLT (ELECTROSURGICAL) ×1 IMPLANT
EVACUATOR SILICONE 100CC (DRAIN) IMPLANT
GAUZE SPONGE 4X4 12PLY STRL (GAUZE/BANDAGES/DRESSINGS) ×1 IMPLANT
GAUZE XEROFORM 5X9 LF (GAUZE/BANDAGES/DRESSINGS) IMPLANT
GLOVE BIO SURGEON STRL SZ7.5 (GLOVE) ×1 IMPLANT
GOWN STRL REUS W/ TWL LRG LVL3 (GOWN DISPOSABLE) ×2 IMPLANT
GOWN STRL REUS W/ TWL XL LVL3 (GOWN DISPOSABLE) ×1 IMPLANT
GOWN STRL REUS W/TWL LRG LVL3 (GOWN DISPOSABLE) ×2
GOWN STRL REUS W/TWL XL LVL3 (GOWN DISPOSABLE) ×1
KIT BASIN OR (CUSTOM PROCEDURE TRAY) ×1 IMPLANT
KIT TURNOVER KIT B (KITS) ×1 IMPLANT
NS IRRIG 1000ML POUR BTL (IV SOLUTION) ×1 IMPLANT
PACK GENERAL/GYN (CUSTOM PROCEDURE TRAY) ×1 IMPLANT
PAD ARMBOARD 7.5X6 YLW CONV (MISCELLANEOUS) ×2 IMPLANT
PAD CAST 4YDX4 CTTN HI CHSV (CAST SUPPLIES) IMPLANT
PADDING CAST COTTON 4X4 STRL (CAST SUPPLIES) ×1
PREVENA RESTOR ARTHOFORM 46X30 (CANNISTER) IMPLANT
STAPLER VISISTAT 35W (STAPLE) ×1 IMPLANT
STOCKINETTE IMPERVIOUS LG (DRAPES) ×1 IMPLANT
SUT ETHILON 3 0 PS 1 (SUTURE) IMPLANT
SUT SILK 0 TIES 10X30 (SUTURE) ×1 IMPLANT
SUT SILK 2 0 (SUTURE) ×1
SUT SILK 2-0 18XBRD TIE 12 (SUTURE) ×1 IMPLANT
SUT SILK 3 0 (SUTURE)
SUT SILK 3-0 18XBRD TIE 12 (SUTURE) IMPLANT
SUT VIC AB 2-0 CT1 18 (SUTURE) ×2 IMPLANT
TOWEL GREEN STERILE (TOWEL DISPOSABLE) ×2 IMPLANT
UNDERPAD 30X36 HEAVY ABSORB (UNDERPADS AND DIAPERS) ×1 IMPLANT
WATER STERILE IRR 1000ML POUR (IV SOLUTION) ×1 IMPLANT

## 2022-04-30 NOTE — Progress Notes (Signed)
This RN entered the patient's room at approximately 22:30 and noted strong smell of cigarette smoke. The patient denied smoking. I called for the charge nurse and this RN and Charge nurse searched patient's room and found a box of [place] cigarette's with a cigarette lighter. Cigarettes and lighter were removed and placed in the patient's chart. Patient was educated on the strict no smoking policy and offered a nicotine patch which patient declined. Advised patient that his room door would now be left opened and any and all guess visits would have to be observed as it is clear visitor(s) are providing cigarettes and lighters to patient as this is the second incident in which cigarettes where removed from the patient's room when smoke was detected by staff.

## 2022-04-30 NOTE — Progress Notes (Signed)
Pt returned from PACU.  Surgical site is wrapped with no signs of drainage and above wrapping normal color and temperature.  Pt is still very lethargic from surgical sedation with intermittent difficulty to arouse.  MD notified.     04/30/22 1237  Vitals  Temp 98 F (36.7 C)  Temp Source Oral  BP (!) 158/93  MAP (mmHg) 113  BP Location Left Arm  BP Method Automatic  Patient Position (if appropriate) Lying  Pulse Rate 63  Pulse Rate Source Monitor  ECG Heart Rate 70  Resp 14  Level of Consciousness  Level of Consciousness Responds to Voice  Oxygen Therapy  SpO2 100 %  O2 Device Room Air  Patient Activity (if Appropriate) In bed  Pulse Oximetry Type Continuous  Pain Assessment  Pain Scale 0-10  Pain Score 0  MEWS Score  MEWS Temp 0  MEWS Systolic 0  MEWS Pulse 0  MEWS RR 0  MEWS LOC 1  MEWS Score 1  MEWS Score Color Green

## 2022-04-30 NOTE — Anesthesia Preprocedure Evaluation (Addendum)
Anesthesia Evaluation  Patient identified by MRN, date of birth, ID band Patient awake    Reviewed: Allergy & Precautions, H&P , NPO status , Patient's Chart, lab work & pertinent test results  Airway Mallampati: III  TM Distance: >3 FB Neck ROM: Full    Dental no notable dental hx. (+) Poor Dentition, Dental Advisory Given   Pulmonary Current Smoker and Patient abstained from smoking.,    Pulmonary exam normal breath sounds clear to auscultation       Cardiovascular hypertension, + Peripheral Vascular Disease   Rhythm:Regular Rate:Normal     Neuro/Psych negative neurological ROS  negative psych ROS   GI/Hepatic Neg liver ROS, GERD  ,  Endo/Other  negative endocrine ROS  Renal/GU Renal InsufficiencyRenal disease  negative genitourinary   Musculoskeletal   Abdominal   Peds  Hematology  (+) Blood dyscrasia, anemia ,   Anesthesia Other Findings   Reproductive/Obstetrics negative OB ROS                            Anesthesia Physical Anesthesia Plan  ASA: 3  Anesthesia Plan: General   Post-op Pain Management: Ofirmev IV (intra-op)* and Ketamine IV*   Induction: Intravenous  PONV Risk Score and Plan: 2 and Ondansetron, Dexamethasone and Midazolam  Airway Management Planned: LMA  Additional Equipment:   Intra-op Plan:   Post-operative Plan: Extubation in OR  Informed Consent: I have reviewed the patients History and Physical, chart, labs and discussed the procedure including the risks, benefits and alternatives for the proposed anesthesia with the patient or authorized representative who has indicated his/her understanding and acceptance.     Dental advisory given  Plan Discussed with: CRNA  Anesthesia Plan Comments:         Anesthesia Quick Evaluation

## 2022-04-30 NOTE — Transfer of Care (Signed)
Immediate Anesthesia Transfer of Care Note  Patient: Jeffrey Fitzgerald  Procedure(s) Performed: AMPUTATION ABOVE KNEE (Left: Knee)  Patient Location: PACU  Anesthesia Type:General  Level of Consciousness: awake and patient cooperative  Airway & Oxygen Therapy: Patient Spontanous Breathing and Patient connected to face mask oxygen  Post-op Assessment: Report given to RN and Post -op Vital signs reviewed and stable  Post vital signs: Reviewed and stable  Last Vitals:  Vitals Value Taken Time  BP    Temp    Pulse 87 04/30/22 1059  Resp 10 04/30/22 1059  SpO2 100 % 04/30/22 1059  Vitals shown include unvalidated device data.  Last Pain:  Vitals:   04/30/22 0826  TempSrc:   PainSc: 0-No pain      Patients Stated Pain Goal: 0 (04/24/22 2010)  Complications: No notable events documented.

## 2022-04-30 NOTE — Op Note (Signed)
    Patient name: Jeffrey Fitzgerald MRN: [id number] DOB: 06/12/1971 Sex: male  04/30/2022 Pre-operative Diagnosis: Nonviable left lower extremity with occluded bypass graft Post-operative diagnosis:  Same Surgeon:  Luanna Salk. Randie Heinz, MD Assistant: Clinton Gallant, PA Procedure Performed: Left above-knee amputation  Indications: 51 year old male with recent complicated vascular history with aortobifemoral bypass now requiring right above-knee amputation and occluded left lower extremity bypass with nonviable left lower extremity now causing significant pain and indicated for left above-knee amputation.  Findings: Bypass graft was occluded and was divided and excluded from the wound.  All tissue did have good capillary bleeding to suggest adequate wound healing potential.   Procedure:  The patient was identified in the holding area and taken to the operating was placed in bilateral table general anesthesia was induced.  He was sterilely prepped draped left lower extremity usual fashion, antibiotics were administered and timeout was called.  A fishmouth type incision was made above the knee.  We dissected down to the femur and transected this with a Gigli saw.  The posterior flap was created with amputation knife.  The graft was pulled on tension tied off and divided and excluded from the wound.  The vascular bundle was suture-ligated with 2-0 Vicryl suture the nerve was pulled on tension divided and tied off with Vicryl suture.  The bone was smoothed with rasp.  The wound was thoroughly irrigated and hemostasis obtained with cautery.  Fascial layers were reapproximated with interrupted 2-0 Vicryl suture and skin was closed with staples.  A sterile dressing was applied.  He was awakened from anesthesia having tolerated procedure without any complication.  All counts were correct at completion.  Given the complexity of the case,  the assistant was necessary in order to expedient the procedure and safely perform  the technical aspects of the operation.  The assistant provided traction and countertraction with help of division of the tissues and bone.  These skills could not have been adequately performed by a scrub tech assistant.    EBL: 100 cc     Inna Tisdell C. Randie Heinz, MD [facility] Office: [phone] Pager: [phone]

## 2022-04-30 NOTE — Progress Notes (Signed)
PROGRESS NOTE/consult    Kacen Celesta Aver  [id number] DOB: Feb 18, 1971 DOA: 04/01/2022 PCP: Patient, No Pcp Per    No chief complaint on file.   Brief Narrative:   Stanislaw Moriarity is a 51 y.o. male with past medical history of hypertension, peripheral vascular disease, and tobacco abuse who presented initially for critical left lower limb ischemia 8/3 requiring aortobifemoral bypass.  He then underwent left common femoral and posterior tibial bypass on 8/9 same-day for right common revision of the right lower extremity aortobifemoral bypass.  He returned to the OR on 8/11 and underwent thrombectomy of left common femoral to posterior tibial artery bypass, right lower extremity fasciotomy closure, and wound VAC placement.  White blood cell count had trended up to 24.8 and the patient has been temporarily treated empirically with Zosyn.  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  His hospital stay had been complicated by hemorrhagic bleed with hemoglobin trending down to 6 g/dL requiring 2 units of packed red blood cells and creatinine trended up to 3.59 before trending down.    He has been complaining of pain out of left leg which was noted to be cold and contracted.  Vascular had discussed need of left above-knee amputation, but he was not willing to undergo amputation of the left leg.  Palliative care had been consulted.  Patient was noted to become more tachycardic and hypertensive with blood pressures elevated into the 200s at times.  He denies any complaints of any chest pain, nausea, vomiting, or diarrhea.  Patient's white blood cell counts have been otherwise trending down.  TRH consulted to help with medical management.   Assessment & Plan:   Principal Problem:   Critical limb ischemia of  both lower extremities (HCC) Active Problems:   PAD (peripheral artery disease) (HCC)   Acute renal failure (HCC)   Other specified anemias   Pressure injury of skin   SIRS (systemic inflammatory response syndrome) (HCC)   Hypertensive urgency   Hypokalemia   Gastroesophageal reflux disease  #1 critical limb ischemia -Patient presented with critical limb ischemia bilateral lower extremities required several attempts at revascularization. -Patient subsequently underwent right AKA on 04/22/2022. -Vascular surgery/primary team recommended left AKA however patient initially declined. -Palliative care consulted and following I spoke with patient and family and patient decided to have left AKA which was done this morning 04/30/2022 per vascular surgery. -Continue aspirin, statin. -Pain management per primary team. -Per vascular surgery.  2.  SIRS -Patient noted to be tachycardic, tachypneic with a leukocytosis white count of 4.4 with noted to be trending down. -Prior blood cultures done with no growth to date. -Patient noted to have been on Zosyn which has subsequently been discontinued. -UA noted moderate hemoglobin, small leukocytes, rare bacteria, 6-10 WBCs.  Patient with no urinary symptoms. -Right lower extremity wound appears to be healing well. -Patient with no respiratory symptoms. -Urine cultures pending. -Leukocytosis trending down. -Blood cultures with no growth to date x5 days. -Follow.  3.  Hypertensive urgency -Patient noted to have blood pressure elevated 198/86 with heart rates in the 120s. -Patient noted to not be on any antihypertensive medications prior to admission. -Patient noted to have been on hydralazine 10 mg twice daily however did not obtain adequate blood pressure control. -Continue Coreg 6.25 mg twice daily, Norvasc 10 mg  daily, hydralazine 10 mg twice daily. -Uptitrate antihypertensive medications for better blood pressure control if needed.  4.   Hypokalemia -Repleted.  Potassium of 4.6.  5.  Acute renal failure -Baseline creatinine approximately 1-1.2. -Renal function noted to trend up to 3.59 on 04/11/2022 and felt likely secondary to contrast given. -Renal function improving with creatinine currently fluctuating and at 1.72 today. -Urine output not recorded over the past 24 hours.  -Follow.  6.  Urinary frequency -Patient noted to have urinary frequency without dysuria. -Urine cultures pending.  7.  Tobacco abuse -Tobacco cessation. -Nicotine patch.  8.  GERD -PPI.   DVT prophylaxis: Per primary team Code Status: Full Family Communication: Updated patient.  No family at bedside. Disposition: Per primary team.  Status is: Inpatient Remains inpatient appropriate because: Severity of illness   Consultants:  Cardiology: Dr. Antoine Poche 04/01/2022 Wound care RN, Helmut Muster 04/09/2022 Palliative care: Albesa Seen, NP 04/21/2022 [facility]: Dr. Katrinka Blazing 04/27/2022  Procedures:  Abdominal films 04/07/2022, 04/12/2022 Chest x-ray 04/07/2022, 04/18/2022 Myoview stress test 04/02/2022 2D echo 04/02/2022 Right AKA per vascular surgery: Dr. Myra Gianotti 04/22/2022 Reexploration and washout of right groin wound less than 30 days postop, sharp excisional debridement of right groin wound, repair of aortobifemoral to profunda femoris artery bypass with pledgeted suture, right groin sartorius muscle flap coverage, application negative pressure dressing per vascular surgery: Dr. Randie Heinz 04/20/2022 Right leg fasciotomy washout, soft tissue debridement, right groin washout, soft tissue debridement, VAC placement per vascular surgery: Dr. Sherral Hammers 04/20/2022 Thrombectomy of left common femoral to posterior tibial artery bypass, closure of right medial calf fasciotomy, application of negative pressure wound VAC greater right lateral calf fasciotomy per vascular surgery: Dr. Chestine Spore 04/09/2022 Right greater saphenous vein harvest, right common femoral to  posterior tibial artery bypass with 6 mm PTFE/greater saphenous vein composite graft, right lower extremity angiogram, right lower extremity 4 compartment fasciotomy, right posterior tibial artery thrombectomy per Dr. Lenell Antu, Dr. Chestine Spore vascular surgery 04/08/2022 Redo exploration of the right groin/right common femoral artery thrombectomy/extensive endarterectomy of right profunda with bovine pericardial patch angioplasty, revision of right limb of the aortobifemoral bypass graft 04/07/2022 per Dr. Randie Heinz. Aortobifemoral bypass, right common femoral endarterectomy with profundoplasty, left common femoral endarterectomy with profundoplasty, harvest of left leg great saphenous vein, left common femoral to posterior tibial bypass with composite PTFE and great saphenous vein per vascular surgery: Dr. Chestine Spore 04/07/2022 Aortobifemoral bypass, bilateral common femoral and profunda femoris endarterectomies, harvest left greater saphenous vein, left common femoral to posterior tibial artery bypass with composite 6 mm E PTFE in reverse greater saphenous vein and anatomic tunnel by Dr. Chestine Spore at [facility] 04/07/2022 Attempted ultrasound-guided access left common femoral artery, ultrasound-guided access right common femoral artery, aortogram with catheter selection of the aorta, bilateral lower extremity arteriogram with runoff, 32 minutes monitored moderate conscious sedation time Per vascular surgery: Dr. Chestine Spore 04/01/2022  Antimicrobials:  Anti-infectives (From admission, onward)    Start     Dose/Rate Route Frequency Ordered Stop   04/21/22 1315  vancomycin (VANCOCIN) IVPB 1000 mg/200 mL premix  Status:  Discontinued        1,000 mg 200 mL/hr over 60 Minutes Intravenous Every 24 hours 04/21/22 0933 04/27/22 1020   04/19/22 1315  vancomycin (VANCOREADY) IVPB 750 mg/150 mL  Status:  Discontinued        750 mg 150 mL/hr over 60 Minutes Intravenous Every 24 hours 04/19/22 1218 04/21/22 0933   04/18/22 1000   piperacillin-tazobactam (ZOSYN) IVPB 3.375 g  Status:  Discontinued        3.375 g 12.5 mL/hr over 240 Minutes Intravenous Every 8 hours 04/18/22 0925 04/27/22 1020   04/08/22 2200  linezolid (ZYVOX) IVPB 600 mg        600 mg 300 mL/hr over 60 Minutes Intravenous Every 12 hours 04/08/22 1622 04/15/22 2359   04/08/22 0000  ceFAZolin (ANCEF) IVPB 2g/100 mL premix        2 g 200 mL/hr over 30 Minutes Intravenous Every 8 hours 04/07/22 2316 04/08/22 1559   04/07/22 1529  ceFAZolin (ANCEF) IVPB 2g/100 mL premix  Status:  Discontinued        2 g 200 mL/hr over 30 Minutes Intravenous 30 min pre-op 04/07/22 1530 04/07/22 2005   04/06/22 0932  vancomycin (VANCOREADY) IVPB 750 mg/150 mL  Status:  Discontinued        750 mg 150 mL/hr over 60 Minutes Intravenous Every 12 hours 04/06/22 0903 04/08/22 1634   04/05/22 2200  vancomycin (VANCOREADY) IVPB 1250 mg/250 mL  Status:  Discontinued        1,250 mg 166.7 mL/hr over 90 Minutes Intravenous Every 48 hours 04/04/22 1728 04/05/22 1023   04/05/22 1800  vancomycin (VANCOREADY) IVPB 1250 mg/250 mL  Status:  Discontinued        1,250 mg 166.7 mL/hr over 90 Minutes Intravenous Every 24 hours 04/05/22 1023 04/06/22 0903   04/02/22 2200  vancomycin (VANCOREADY) IVPB 750 mg/150 mL  Status:  Discontinued        750 mg 150 mL/hr over 60 Minutes Intravenous Every 12 hours 04/02/22 0853 04/04/22 1728   04/02/22 1000  piperacillin-tazobactam (ZOSYN) IVPB 3.375 g        3.375 g 12.5 mL/hr over 240 Minutes Intravenous Every 8 hours 04/02/22 0828 04/15/22 2359   04/02/22 1000  vancomycin (VANCOREADY) IVPB 1500 mg/300 mL        1,500 mg 150 mL/hr over 120 Minutes Intravenous  Once 04/02/22 0828 04/02/22 1141         Subjective: Patient just returned from OR.  Sedated.  Opens eyes to verbal stimuli but drifts back off to sleep.  Following some commands.    Objective: Vitals:   04/30/22 1100 04/30/22 1115 04/30/22 1130 04/30/22 1145  BP: 129/72 (!) 150/87  (!) 163/90 (!) 168/91  Pulse: 87 89 89 88  Resp: 10 12 15 15   Temp: 98.6 F (37 C)   98.6 F (37 C)  TempSrc:      SpO2: 100% 100% 100% 100%  Weight:      Height:        Intake/Output Summary (Last 24 hours) at 04/30/2022 1231 Last data filed at 04/30/2022 1050 Gross per 24 hour  Intake 400 ml  Output 100 ml  Net 300 ml    Filed Weights   04/09/22 0830 04/22/22 0831 04/30/22 0825  Weight: 68.8 kg 65.8 kg 65.8 kg    Examination:  General exam: Drowsy/sedated. Respiratory system: Lungs clear to auscultation bilaterally.  No wheezes, no crackles, no rhonchi.  Normal respiratory effort. Cardiovascular system: Regular rate rhythm no murmurs rubs or gallops.  No JVD.  No lower extremity edema.   Gastrointestinal system: Abdomen soft, nontender, nondistended, positive bowel sounds.    Central nervous system: Alert and oriented. No focal neurological deficits. Extremities: Status post right AKA.  Status post left AKA.   Skin: No rashes, lesions or ulcers Psychiatry: Judgement and insight unable to assess as patient drowsy just returning from PACU postoperatively.  Data Reviewed: I have personally reviewed following labs and imaging studies  CBC: Recent Labs  Lab 04/25/22 0204 04/26/22 0602 04/27/22 0212 04/28/22 0604 04/29/22 0237  WBC 15.7* 16.1* 14.4* 11.9* 10.2  NEUTROABS  --   --   --   --  7.7  HGB 8.7* 9.1* 8.8* 8.6* 8.5*  HCT 26.4* 27.1* 26.5* 25.9* 26.1*  MCV 89.2 88.0 88.3 89.0 89.1  PLT 283 338 367 378 407*     Basic Metabolic Panel: Recent Labs  Lab 04/24/22 0337 04/25/22 0204 04/27/22 0212 04/28/22 0604 04/29/22 0237  NA 135 136 137 136 135  K 3.5 3.2* 3.3* 4.1 3.5  CL 106 105 104 106 104  CO2 21* 23 23 21* 24  GLUCOSE 116* 122* 101* 98 104*  BUN 13 10 10 11 9   CREATININE 1.67* 1.67* 1.60* 1.68* 1.46*  CALCIUM 7.8* 7.7* 8.0* 8.2* 8.3*  MG  --   --   --  2.0  --      GFR: Estimated Creatinine Clearance: 52.7 mL/min (A) (by C-G formula  based on SCr of 1.46 mg/dL (H)).  Liver Function Tests: No results for input(s): "AST", "ALT", "ALKPHOS", "BILITOT", "PROT", "ALBUMIN" in the last 168 hours.  CBG: Recent Labs  Lab 04/29/22 1831 04/29/22 2129 04/30/22 0632 04/30/22 1216  GLUCAP 98 111* 113* 119*     No results found for this or any previous visit (from the past 240 hour(s)).       Radiology Studies: No results found.      Scheduled Meds:  amLODipine  10 mg Oral Daily   aspirin EC  81 mg Oral Daily   atorvastatin  80 mg Oral Daily   carvedilol  6.25 mg Oral BID WC   docusate sodium  100 mg Oral Daily   hydrALAZINE  10 mg Oral BID   nicotine  21 mg Transdermal Daily   pantoprazole  40 mg Oral Daily   sodium chloride flush  3 mL Intravenous Q12H   Continuous Infusions:  sodium chloride 180 mL/hr at 04/20/22 0944   sodium chloride 10 mL/hr at 04/25/22 1616     LOS: 29 days    Time spent: 35 minutes    Irine Seal, MD [facility]   To contact the attending provider between 7A-7P or the covering provider during after hours 7P-7A, please log into the web site [email or web address] and access using universal [facility] password for that web site. If you do not have the password, please call the hospital operator.  04/30/2022, 12:31 PM

## 2022-04-30 NOTE — Progress Notes (Addendum)
[facility]  Subjective  - Pain in the left LE   Objective (!) 174/94 92 98.2 F (36.8 C) (Oral) 12 100%  Intake/Output Summary (Last 24 hours) at 04/30/2022 0707 Last data filed at 04/29/2022 1100 Gross per 24 hour  Intake --  Output 400 ml  Net -400 ml    Left LE knee contracture with left foot rest pain, cool to touch Right groin vac to suction    Assessment/Planning: S/P right AKA for ischemia and Washout of right femoral incision with placement of myriad powder and exchange of wound VAC Failed left LE bypass with ischemic rest pain and knee contrature  Based on palliative note  Jeffrey Fitzgerald has decided to proceed with left AKA amputation as he wishes to live and ultimately return back home. NPO  Roxy Horseman 04/30/2022 7:07 AM --  Laboratory Lab Results: Recent Labs    04/28/22 0604 04/29/22 0237  WBC 11.9* 10.2  HGB 8.6* 8.5*  HCT 25.9* 26.1*  PLT 378 407*   BMET Recent Labs    04/28/22 0604 04/29/22 0237  NA 136 135  K 4.1 3.5  CL 106 104  CO2 21* 24  GLUCOSE 98 104*  BUN 11 9  CREATININE 1.68* 1.46*  CALCIUM 8.2* 8.3*    COAG Lab Results  Component Value Date   INR 1.2 04/11/2022   INR 1.5 (H) 04/07/2022   INR 0.9 03/09/2022   No results found for: "PTT"   I have independently interviewed and examined patient and agree with PA assessment and plan above. Plan for left aka today in OR.   Corderro Koloski C. Donzetta Matters, MD [facility] Office: [phone] Pager: [phone]

## 2022-04-30 NOTE — Anesthesia Procedure Notes (Signed)
Procedure Name: LMA Insertion Date/Time: 04/30/2022 10:04 AM  Performed by: Nadara Mustard, CRNAPre-anesthesia Checklist: Patient identified, Emergency Drugs available, Suction available and Patient being monitored Patient Re-evaluated:Patient Re-evaluated prior to induction Oxygen Delivery Method: Circle system utilized Preoxygenation: Pre-oxygenation with 100% oxygen Induction Type: IV induction Ventilation: Mask ventilation without difficulty LMA: LMA inserted Laryngoscope Size: 5 Tube type: Oral Number of attempts: 1 Airway Equipment and Method: Stylet and Oral airway Placement Confirmation: ETT inserted through vocal cords under direct vision, positive ETCO2 and breath sounds checked- equal and bilateral Tube secured with: Tape Dental Injury: Teeth and Oropharynx as per pre-operative assessment

## 2022-04-30 NOTE — Anesthesia Postprocedure Evaluation (Signed)
Anesthesia Post Note  Patient: Jeffrey Fitzgerald  Procedure(s) Performed: AMPUTATION ABOVE KNEE (Left: Knee)     Patient location during evaluation: PACU Anesthesia Type: General Level of consciousness: awake and alert Pain management: pain level controlled Vital Signs Assessment: post-procedure vital signs reviewed and stable Respiratory status: spontaneous breathing, nonlabored ventilation and respiratory function stable Cardiovascular status: blood pressure returned to baseline and stable Postop Assessment: no apparent nausea or vomiting Anesthetic complications: no   No notable events documented.  Last Vitals:  Vitals:   04/30/22 1130 04/30/22 1145  BP: (!) 163/90 (!) 168/91  Pulse: 89 88  Resp: 15 15  Temp:  37 C  SpO2: 100% 100%    Last Pain:  Vitals:   04/30/22 1130  TempSrc:   PainSc: 0-No pain                 Rehema Muffley,W. EDMOND

## 2022-04-30 NOTE — Consult Note (Signed)
WOC Nurse wound follow up Patient had been receiving care in [facility] 4E12. Patient now in prep for L AKA surgical procedure. The VAC dressing was requested for the right groin, but I was not able to get the dressing to the area changed prior to his exit for the OR. Helmut Muster, RN, MSN, CWOCN, CNS-BC, pager [phone]

## 2022-05-01 ENCOUNTER — Encounter (HOSPITAL_COMMUNITY): Payer: Self-pay | Admitting: Vascular Surgery

## 2022-05-01 LAB — BASIC METABOLIC PANEL
Anion gap: 10 (ref 5–15)
BUN: 17 mg/dL (ref 6–20)
CO2: 21 mmol/L — ABNORMAL LOW (ref 22–32)
Calcium: 8.1 mg/dL — ABNORMAL LOW (ref 8.9–10.3)
Chloride: 105 mmol/L (ref 98–111)
Creatinine, Ser: 1.72 mg/dL — ABNORMAL HIGH (ref 0.61–1.24)
GFR, Estimated: 48 mL/min — ABNORMAL LOW (ref >60)
Glucose, Bld: 99 mg/dL (ref 70–99)
Potassium: 4.5 mmol/L (ref 3.5–5.1)
Sodium: 136 mmol/L (ref 135–145)

## 2022-05-01 LAB — CBC WITH DIFFERENTIAL/PLATELET
Abs Immature Granulocytes: 0.05 10*3/uL (ref 0.00–0.07)
Basophils Absolute: 0 10*3/uL (ref 0.0–0.1)
Basophils Relative: 0 %
Eosinophils Absolute: 0 10*3/uL (ref 0.0–0.5)
Eosinophils Relative: 0 %
HCT: 24.2 % — ABNORMAL LOW (ref 39.0–52.0)
Hemoglobin: 7.8 g/dL — ABNORMAL LOW (ref 13.0–17.0)
Immature Granulocytes: 1 %
Lymphocytes Relative: 17 %
Lymphs Abs: 1.8 10*3/uL (ref 0.7–4.0)
MCH: 29 pg (ref 26.0–34.0)
MCHC: 32.2 g/dL (ref 30.0–36.0)
MCV: 90 fL (ref 80.0–100.0)
Monocytes Absolute: 1.2 10*3/uL — ABNORMAL HIGH (ref 0.1–1.0)
Monocytes Relative: 11 %
Neutro Abs: 7.5 10*3/uL (ref 1.7–7.7)
Neutrophils Relative %: 71 %
Platelets: 420 10*3/uL — ABNORMAL HIGH (ref 150–400)
RBC: 2.69 MIL/uL — ABNORMAL LOW (ref 4.22–5.81)
RDW: 15.8 % — ABNORMAL HIGH (ref 11.5–15.5)
WBC: 10.6 10*3/uL — ABNORMAL HIGH (ref 4.0–10.5)
nRBC: 0 % (ref 0.0–0.2)

## 2022-05-01 LAB — HEMOGLOBIN AND HEMATOCRIT, BLOOD
HCT: 25.2 % — ABNORMAL LOW (ref 39.0–52.0)
Hemoglobin: 8 g/dL — ABNORMAL LOW (ref 13.0–17.0)

## 2022-05-01 NOTE — Progress Notes (Signed)
PROGRESS NOTE/consult    Jeffrey Fitzgerald  KAJ:[id number] DOB: 1971-05-03 DOA: 04/01/2022 PCP: Patient, No Pcp Per    No chief complaint on file.   Brief Narrative:   Jeffrey Fitzgerald is a 51 y.o. male with past medical history of hypertension, peripheral vascular disease, and tobacco abuse who presented initially for critical left lower limb ischemia 8/3 requiring aortobifemoral bypass.  He then underwent left common femoral and posterior tibial bypass on 8/9 same-day for right common revision of the right lower extremity aortobifemoral bypass.  He returned to the OR on 8/11 and underwent thrombectomy of left common femoral to posterior tibial artery bypass, right lower extremity fasciotomy closure, and wound VAC placement.  White blood cell count had trended up to 24.8 and the patient has been temporarily treated empirically with Zosyn.  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  His hospital stay had been complicated by hemorrhagic bleed with hemoglobin trending down to 6 g/dL requiring 2 units of packed red blood cells and creatinine trended up to 3.59 before trending down.    He has been complaining of pain out of left leg which was noted to be cold and contracted.  Vascular had discussed need of left above-knee amputation, but he was not willing to undergo amputation of the left leg.  Palliative care had been consulted.  Patient was noted to become more tachycardic and hypertensive with blood pressures elevated into the 200s at times.  He denies any complaints of any chest pain, nausea, vomiting, or diarrhea.  Patient's white blood cell counts have been otherwise trending down.  TRH consulted to help with medical management.   Assessment & Plan:   Principal Problem:   Critical limb ischemia of  both lower extremities (HCC) Active Problems:   PAD (peripheral artery disease) (HCC)   Acute renal failure (HCC)   Other specified anemias   Pressure injury of skin   SIRS (systemic inflammatory response syndrome) (HCC)   Hypertensive urgency   Hypokalemia   Gastroesophageal reflux disease  #1 critical limb ischemia -Patient presented with critical limb ischemia bilateral lower extremities required several attempts at revascularization. -Patient subsequently underwent right AKA on 04/22/2022. -Vascular surgery/primary team recommended left AKA however patient initially declined. -Palliative care consulted and following I spoke with patient and family and patient decided to have left AKA which was done this morning 04/30/2022 per vascular surgery. -Continue aspirin, statin. -Pain management per primary team. -Per vascular surgery.  2.  SIRS -Patient noted to be tachycardic, tachypneic with a leukocytosis white count of 18.5 with noted to be trending down. -Prior blood cultures done with no growth to date. -Patient noted to have been on Zosyn which has subsequently been discontinued. -UA noted moderate hemoglobin, small leukocytes, rare bacteria, 6-10 WBCs.  Patient with no urinary symptoms. -Right lower extremity wound appears to be healing well. -Patient with no respiratory symptoms. -Urine cultures pending. -Leukocytosis trending down. -Blood cultures with no growth to date x5 days. -Follow.  3.  Hypertensive urgency -Patient noted to have blood pressure elevated 198/86 with heart rates in the 120s. -Likely a component of pain contributing to hypertensive urgency. -Patient noted to not be on any antihypertensive medications prior to admission. -Patient noted to have been on hydralazine 10 mg twice daily however did not obtain adequate blood pressure control. -  Continue Coreg 6.25 mg twice daily, Norvasc 10 mg daily, hydralazine 10 mg twice daily. -Uptitrate antihypertensive  medications for better blood pressure control if needed.  4.  Hypokalemia -Repleted.   -Potassium of 4.5.  5.  Acute renal failure -Baseline creatinine approximately 1-1.2. -Renal function noted to trend up to 3.59 on 04/11/2022 and felt likely secondary to contrast given. -Renal function improving with creatinine currently fluctuating and at 1.72 today. -Urine output 800 cc recorded over the past 24 hours. -Follow.  6.  Urinary frequency -Patient noted to have urinary frequency without dysuria. -Urine cultures pending.  7.  Tobacco abuse -Tobacco cessation. -Nicotine patch.  8.  GERD -PPI.   DVT prophylaxis: Per primary team Code Status: Full Family Communication: Updated patient.  No family at bedside. Disposition: Per primary team.  Status is: Inpatient Remains inpatient appropriate because: Severity of illness   Consultants:  Cardiology: Dr. Antoine Poche 04/01/2022 Wound care RN, Helmut Muster 04/09/2022 Palliative care: Albesa Seen, NP 04/21/2022 [facility]: Dr. Katrinka Blazing 04/27/2022  Procedures:  Abdominal films 04/07/2022, 04/12/2022 Chest x-ray 04/07/2022, 04/18/2022 Myoview stress test 04/02/2022 2D echo 04/02/2022 Right AKA per vascular surgery: Dr. Myra Gianotti 04/22/2022 Reexploration and washout of right groin wound less than 30 days postop, sharp excisional debridement of right groin wound, repair of aortobifemoral to profunda femoris artery bypass with pledgeted suture, right groin sartorius muscle flap coverage, application negative pressure dressing per vascular surgery: Dr. Randie Heinz 04/20/2022 Right leg fasciotomy washout, soft tissue debridement, right groin washout, soft tissue debridement, VAC placement per vascular surgery: Dr. Sherral Hammers 04/20/2022 Thrombectomy of left common femoral to posterior tibial artery bypass, closure of right medial calf fasciotomy, application of negative pressure wound VAC greater right lateral calf fasciotomy per vascular surgery: Dr. Chestine Spore  04/09/2022 Right greater saphenous vein harvest, right common femoral to posterior tibial artery bypass with 6 mm PTFE/greater saphenous vein composite graft, right lower extremity angiogram, right lower extremity 4 compartment fasciotomy, right posterior tibial artery thrombectomy per Dr. Lenell Antu, Dr. Chestine Spore vascular surgery 04/08/2022 Redo exploration of the right groin/right common femoral artery thrombectomy/extensive endarterectomy of right profunda with bovine pericardial patch angioplasty, revision of right limb of the aortobifemoral bypass graft 04/07/2022 per Dr. Randie Heinz. Aortobifemoral bypass, right common femoral endarterectomy with profundoplasty, left common femoral endarterectomy with profundoplasty, harvest of left leg great saphenous vein, left common femoral to posterior tibial bypass with composite PTFE and great saphenous vein per vascular surgery: Dr. Chestine Spore 04/07/2022 Aortobifemoral bypass, bilateral common femoral and profunda femoris endarterectomies, harvest left greater saphenous vein, left common femoral to posterior tibial artery bypass with composite 6 mm E PTFE in reverse greater saphenous vein and anatomic tunnel by Dr. Chestine Spore at [facility] 04/07/2022 Attempted ultrasound-guided access left common femoral artery, ultrasound-guided access right common femoral artery, aortogram with catheter selection of the aorta, bilateral lower extremity arteriogram with runoff, 32 minutes monitored moderate conscious sedation time Per vascular surgery: Dr. Chestine Spore 04/01/2022 Left AKA per vascular surgery: Dr. Randie Heinz 04/30/2022  Antimicrobials:  Anti-infectives (From admission, onward)    Start     Dose/Rate Route Frequency Ordered Stop   04/21/22 1315  vancomycin (VANCOCIN) IVPB 1000 mg/200 mL premix  Status:  Discontinued        1,000 mg 200 mL/hr over 60 Minutes Intravenous Every 24 hours 04/21/22 0933 04/27/22 1020   04/19/22 1315  vancomycin (VANCOREADY) IVPB 750 mg/150 mL  Status:   Discontinued        750 mg 150 mL/hr over 60 Minutes Intravenous Every 24  hours 04/19/22 1218 04/21/22 0933   04/18/22 1000  piperacillin-tazobactam (ZOSYN) IVPB 3.375 g  Status:  Discontinued        3.375 g 12.5 mL/hr over 240 Minutes Intravenous Every 8 hours 04/18/22 0925 04/27/22 1020   04/08/22 2200  linezolid (ZYVOX) IVPB 600 mg        600 mg 300 mL/hr over 60 Minutes Intravenous Every 12 hours 04/08/22 1622 04/15/22 2359   04/08/22 0000  ceFAZolin (ANCEF) IVPB 2g/100 mL premix        2 g 200 mL/hr over 30 Minutes Intravenous Every 8 hours 04/07/22 2316 04/08/22 1559   04/07/22 1529  ceFAZolin (ANCEF) IVPB 2g/100 mL premix  Status:  Discontinued        2 g 200 mL/hr over 30 Minutes Intravenous 30 min pre-op 04/07/22 1530 04/07/22 2005   04/06/22 0932  vancomycin (VANCOREADY) IVPB 750 mg/150 mL  Status:  Discontinued        750 mg 150 mL/hr over 60 Minutes Intravenous Every 12 hours 04/06/22 0903 04/08/22 1634   04/05/22 2200  vancomycin (VANCOREADY) IVPB 1250 mg/250 mL  Status:  Discontinued        1,250 mg 166.7 mL/hr over 90 Minutes Intravenous Every 48 hours 04/04/22 1728 04/05/22 1023   04/05/22 1800  vancomycin (VANCOREADY) IVPB 1250 mg/250 mL  Status:  Discontinued        1,250 mg 166.7 mL/hr over 90 Minutes Intravenous Every 24 hours 04/05/22 1023 04/06/22 0903   04/02/22 2200  vancomycin (VANCOREADY) IVPB 750 mg/150 mL  Status:  Discontinued        750 mg 150 mL/hr over 60 Minutes Intravenous Every 12 hours 04/02/22 0853 04/04/22 1728   04/02/22 1000  piperacillin-tazobactam (ZOSYN) IVPB 3.375 g        3.375 g 12.5 mL/hr over 240 Minutes Intravenous Every 8 hours 04/02/22 0828 04/15/22 2359   04/02/22 1000  vancomycin (VANCOREADY) IVPB 1500 mg/300 mL        1,500 mg 150 mL/hr over 120 Minutes Intravenous  Once 04/02/22 0828 04/02/22 1141         Subjective: Laying in bed.  No chest pain.  No shortness of breath.  No abdominal pain.  Left lower extremity pain  improved postoperatively/post amputation.  Denies any overt bleeding.  Objective: Vitals:   05/01/22 0435 05/01/22 0747 05/01/22 1024 05/01/22 1118  BP: (!) 166/80 (!) 157/87  (!) 147/84  Pulse: (!) 110 100 (!) 108 97  Resp: 15 11  11   Temp: 98 F (36.7 C) 97.9 F (36.6 C)  97.6 F (36.4 C)  TempSrc: Oral Oral  Oral  SpO2:  100%  100%  Weight:      Height:        Intake/Output Summary (Last 24 hours) at 05/01/2022 1210 Last data filed at 05/01/2022 0436 Gross per 24 hour  Intake --  Output 900 ml  Net -900 ml    Filed Weights   04/09/22 0830 04/22/22 0831 04/30/22 0825  Weight: 68.8 kg 65.8 kg 65.8 kg    Examination:  General exam: NAD Respiratory system: CTA B.  No wheezes, no crackles, no rhonchi.  Normal respiratory effort.  Cardiovascular system: RRR no murmurs rubs or gallops.  No JVD. Gastrointestinal system: Abdomen soft, nontender, nondistended, positive bowel sounds.    Central nervous system: Alert and oriented. No focal neurological deficits. Extremities: Status post right AKA with incision site c/d/i.  Status post left AKA with stump in postop bandage.  Skin: No rashes, lesions or ulcers Psychiatry: Judgement and insight normal.  Mood and affect appropriate.    Data Reviewed: I have personally reviewed following labs and imaging studies  CBC: Recent Labs  Lab 04/27/22 0212 04/28/22 0604 04/29/22 0237 04/30/22 1223 05/01/22 0202  WBC 14.4* 11.9* 10.2 15.6* 10.6*  NEUTROABS  --   --  7.7 13.8* 7.5  HGB 8.8* 8.6* 8.5* 9.1* 7.8*  HCT 26.5* 25.9* 26.1* 27.0* 24.2*  MCV 88.3 89.0 89.1 88.2 90.0  PLT 367 378 407* 430* 420*     Basic Metabolic Panel: Recent Labs  Lab 04/27/22 0212 04/28/22 0604 04/29/22 0237 04/30/22 1223 05/01/22 0202  NA 137 136 135 137 136  K 3.3* 4.1 3.5 4.6 4.5  CL 104 106 104 101 105  CO2 23 21* 24 21* 21*  GLUCOSE 101* 98 104* 120* 99  BUN 10 11 9 12 17   CREATININE 1.60* 1.68* 1.46* 1.72* 1.72*  CALCIUM 8.0* 8.2*  8.3* 8.8* 8.1*  MG  --  2.0  --  2.0  --      GFR: Estimated Creatinine Clearance: 44.7 mL/min (A) (by C-G formula based on SCr of 1.72 mg/dL (H)).  Liver Function Tests: No results for input(s): "AST", "ALT", "ALKPHOS", "BILITOT", "PROT", "ALBUMIN" in the last 168 hours.  CBG: Recent Labs  Lab 04/29/22 1831 04/29/22 2129 04/30/22 0632 04/30/22 1216 04/30/22 1558  GLUCAP 98 111* 113* 119* 157*      No results found for this or any previous visit (from the past 240 hour(s)).       Radiology Studies: No results found.      Scheduled Meds:  amLODipine  10 mg Oral Daily   aspirin EC  81 mg Oral Daily   atorvastatin  80 mg Oral Daily   carvedilol  6.25 mg Oral BID WC   docusate sodium  100 mg Oral Daily   hydrALAZINE  10 mg Oral BID   nicotine  21 mg Transdermal Daily   pantoprazole  40 mg Oral Daily   sodium chloride flush  3 mL Intravenous Q12H   Continuous Infusions:  sodium chloride 180 mL/hr at 04/20/22 0944   sodium chloride 10 mL/hr at 04/25/22 1616     LOS: 30 days    Time spent: 35 minutes    04/27/22, MD [facility]   To contact the attending provider between 7A-7P or the covering provider during after hours 7P-7A, please log into the web site [email or web address] and access using universal [place] password for that web site. If you do not have the password, please call the hospital operator.  05/01/2022, 12:10 PM

## 2022-05-01 NOTE — Progress Notes (Addendum)
  Progress Note    05/01/2022 7:51 AM 1 Day Post-Op  Subjective:  Patient denies any pain, he is ready for his breakfast    Vitals:   05/01/22 0435 05/01/22 0747  BP: (!) 166/80 (!) 157/87  Pulse: (!) 110 100  Resp: 15 11  Temp: 98 F (36.7 C) 97.9 F (36.6 C)  SpO2:  100%    Physical Exam: Lungs: nonlabored Incisions:  R AKA incision intact with staples. L AKA with dressing, will come down tomorrow. Wound vac R groin with good seal Extremities:  moving freely  CBC    Component Value Date/Time   WBC 10.6 (H) 05/01/2022 0202   RBC 2.69 (L) 05/01/2022 0202   HGB 7.8 (L) 05/01/2022 0202   HCT 24.2 (L) 05/01/2022 0202   PLT 420 (H) 05/01/2022 0202   MCV 90.0 05/01/2022 0202   MCH 29.0 05/01/2022 0202   MCHC 32.2 05/01/2022 0202   RDW 15.8 (H) 05/01/2022 0202   LYMPHSABS 1.8 05/01/2022 0202   MONOABS 1.2 (H) 05/01/2022 0202   EOSABS 0.0 05/01/2022 0202   BASOSABS 0.0 05/01/2022 0202    BMET    Component Value Date/Time   NA 136 05/01/2022 0202   K 4.5 05/01/2022 0202   CL 105 05/01/2022 0202   CO2 21 (L) 05/01/2022 0202   GLUCOSE 99 05/01/2022 0202   BUN 17 05/01/2022 0202   CREATININE 1.72 (H) 05/01/2022 0202   CALCIUM 8.1 (L) 05/01/2022 0202   GFRNONAA 48 (L) 05/01/2022 0202   GFRAA >60 01/12/2020 1421    INR    Component Value Date/Time   INR 1.2 04/11/2022 0437     Intake/Output Summary (Last 24 hours) at 05/01/2022 0751 Last data filed at 05/01/2022 0436 Gross per 24 hour  Intake 400 ml  Output 1000 ml  Net -600 ml     Assessment/Plan:  51 y.o. male is 1 day post op, s/p: L AKA   -L AKA intact with dressing, will be taken down tomorrow -R AKA intact with staples -Pain is well controlled with oral pain meds -R groin with wound vac with good seal, we will change at bedside today   Loel Dubonnet, PA-C [facility] [phone] 05/01/2022 7:51 AM   I have seen and evaluated the patient. I agree with the PA note as  documented above.  Status post aortobifemoral bypass with bilateral lower extremity fem-tib bypasses.  Ultimately his right calf muscle became necrotic and he required right AKA.  His left leg bypass failed after multiple revisions.  Required left AKA yesterday.  We will take down dressing to the left AKA tomorrow.  Right AKA appears to be healing well with staples.  Midline and left groin incision look great.  Right groin getting changed by wound care.  We will see what PT OT recommends now that he has had bilateral AKA's.  Cephus Shelling, MD [facility] [place] Office: [phone]

## 2022-05-02 DIAGNOSIS — D62 Acute posthemorrhagic anemia: Secondary | ICD-10-CM

## 2022-05-02 LAB — CBC
HCT: 24.6 % — ABNORMAL LOW (ref 39.0–52.0)
Hemoglobin: 7.7 g/dL — ABNORMAL LOW (ref 13.0–17.0)
MCH: 28.4 pg (ref 26.0–34.0)
MCHC: 31.3 g/dL (ref 30.0–36.0)
MCV: 90.8 fL (ref 80.0–100.0)
Platelets: 390 10*3/uL (ref 150–400)
RBC: 2.71 MIL/uL — ABNORMAL LOW (ref 4.22–5.81)
RDW: 15.8 % — ABNORMAL HIGH (ref 11.5–15.5)
WBC: 9.8 10*3/uL (ref 4.0–10.5)
nRBC: 0 % (ref 0.0–0.2)

## 2022-05-02 LAB — BASIC METABOLIC PANEL
Anion gap: 7 (ref 5–15)
BUN: 16 mg/dL (ref 6–20)
CO2: 22 mmol/L (ref 22–32)
Calcium: 8.2 mg/dL — ABNORMAL LOW (ref 8.9–10.3)
Chloride: 109 mmol/L (ref 98–111)
Creatinine, Ser: 1.5 mg/dL — ABNORMAL HIGH (ref 0.61–1.24)
GFR, Estimated: 56 mL/min — ABNORMAL LOW (ref >60)
Glucose, Bld: 108 mg/dL — ABNORMAL HIGH (ref 70–99)
Potassium: 4.4 mmol/L (ref 3.5–5.1)
Sodium: 138 mmol/L (ref 135–145)

## 2022-05-02 NOTE — Evaluation (Signed)
Physical Therapy Re-Evaluation Patient Details Name: Jeffrey Fitzgerald MRN: [id number] DOB: 26-Jan-1971 Today's Date: 05/02/2022  History of Present Illness  Pt is a 51 yo male admitted 04/01/22 from cath lab after aortogram for LLE ischemia. S/p aortobifemoral bypass graft with bilateral femoral endarterectomy on 8/9. Post op RLE ischemia with return to OR same date for redo R groin exploration with thrombectomy and angioplasty. S/p RLE fem-pop bypass graft with 4 compartment fasciotomy on 8/10. S/p thrombectomy left femoral to popliteal BPG wtih closure and right fasciotomy wtih VAC on right LE on 8/11. Ileus with emesis on 8/14. 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  Clinical Impression   Pt is now s/p L AKA on 9/2. Lives at home with his sister; Prior to admission, pt was able to walk independently; We discussed how transfers will have to change now with the other amputation; He is motivated to get prostheses, and we discussed overall typical progression and the amount of work it will take with bil AKAs now; Educated pt on gluteal strengthening and hip flexor strengthening as well as UE and trunk/core strengthening he will need to do to prepare for prostheses; Presents to PT with some L residual limb soreness, and functional dependencies; today, pt needed 2 person mod assist for anterior posterior transfer OOB to recliner;He participated well, and overall looks like he has good rehab potential;  Pt currently with functional limitations due to the deficits listed below (see PT Problem List). Pt will benefit from skilled PT to increase their independence and safety with mobility to allow discharge to the venue listed below.      I recommend we take another look at an Acute Inpt Rehab stay to maximize independence and safety with mobility, and emphasize needed therex to reach his eventual goal of walking again     Recommendations for follow up therapy are one component of a  multi-disciplinary discharge planning process, led by the attending physician.  Recommendations may be updated based on patient status, additional functional criteria and insurance authorization.  Follow Up Recommendations Acute inpatient rehab (3hours/day) Can patient physically be transported by private vehicle: No    Assistance Recommended at Discharge Frequent or constant Supervision/Assistance  Patient can return home with the following  A lot of help with walking and/or transfers;A lot of help with bathing/dressing/bathroom;Assistance with cooking/housework;Direct supervision/assist for medications management;Direct supervision/assist for financial management;Assist for transportation;Help with stairs or ramp for entrance    Equipment Recommendations Wheelchair (measurements PT);Wheelchair cushion (measurements PT);Other (comment) (sliding board)  Recommendations for Other Services       Functional Status Assessment Patient has had a recent decline in their functional status and demonstrates the ability to make significant improvements in function in a reasonable and predictable amount of time.     Precautions / Restrictions Precautions Precautions: Fall Precaution Comments: watch HR, bowel incontience      Mobility  Bed Mobility Overal bed mobility: Needs Assistance Bed Mobility: Supine to Sit     Supine to sit: HOB elevated, Mod assist     General bed mobility comments: pulled to sit as PT and Rehab Tech used bed pad to position in sitting as pt pulled up on bedrail to sit    Transfers Overall transfer level: Needs assistance Equipment used:  (bed pad) Transfers: Bed to chair/wheelchair/BSC         Anterior-Posterior transfers: Mod assist, +2 physical assistance, +2 safety/equipment   General transfer comment: performed antpost transfer bed to chair;  pt with good lateral leans and wiehgt shifts to allow for scooting, and pushed up with bil UEs beside hips; 2  person mod assist to scoot backwards into recliner    Ambulation/Gait                  Stairs            Wheelchair Mobility    Modified Rankin (Stroke Patients Only)       Balance     Sitting balance-Leahy Scale: Good                                       Pertinent Vitals/Pain Pain Assessment Pain Assessment: Faces Pain Score: 5  Pain Location: BLE Pain Descriptors / Indicators: Grimacing Pain Intervention(s): Monitored during session    Home Living Family/patient expects to be discharged to:: Private residence Living Arrangements: Other relatives (sister) Available Help at Discharge: Family;Available 24 hours/day Type of Home: House Home Access: Level entry       Home Layout: One level Home Equipment: Cane - single point;Electric scooter      Prior Function Prior Level of Function : Independent/Modified Independent             Mobility Comments: uses a scooter for all mobility other than walking in bathroom holding wall; sometimes brings the scooter into the bathroom ADLs Comments: stands to shower, doesn't drive     Hand Dominance   Dominant Hand: Right    Extremity/Trunk Assessment   Upper Extremity Assessment Upper Extremity Assessment: Generalized weakness    Lower Extremity Assessment Lower Extremity Assessment: RLE deficits/detail;LLE deficits/detail RLE Deficits / Details: s/p AKA on 8/24; able to move hip in all directions LLE Deficits / Details: Now s/p AKA on 9/2; sore postop       Communication   Communication: No difficulties  Cognition Arousal/Alertness: Awake/alert Behavior During Therapy: Flat affect Overall Cognitive Status: Within Functional Limits for tasks assessed (for simple mobility tasks)                               Problem Solving: Decreased initiation          General Comments General comments (skin integrity, edema, etc.): taught pt desensitization; discussed  progression to getting prosthetic LEs; discussd and demonstrated prone-lying therex, bolstered bridging, and chair push ups    Exercises     Assessment/Plan    PT Assessment Patient needs continued PT services  PT Problem List Decreased strength;Decreased mobility;Decreased safety awareness;Decreased range of motion;Decreased coordination;Decreased activity tolerance;Decreased balance;Decreased knowledge of use of DME;Pain       PT Treatment Interventions DME instruction;Functional mobility training;Therapeutic activities;Therapeutic exercise;Balance training;Neuromuscular re-education;Cognitive remediation;Patient/family education;Wheelchair mobility training    PT Goals (Current goals can be found in the Care Plan section)  Acute Rehab PT Goals Patient Stated Goal: to go home PT Goal Formulation: With patient Time For Goal Achievement: 05/07/22 Potential to Achieve Goals: Fair Additional Goals Additional Goal #1: Pt will perform 3 push ups to plank position with bil anterior thighs bolstered    Frequency Min 3X/week     Co-evaluation               AM-PAC PT "6 Clicks" Mobility  Outcome Measure Help needed turning from your back to your side while in a flat bed without using bedrails?: None Help needed moving  from lying on your back to sitting on the side of a flat bed without using bedrails?: A Lot Help needed moving to and from a bed to a chair (including a wheelchair)?: Total Help needed standing up from a chair using your arms (e.g., wheelchair or bedside chair)?: Total Help needed to walk in hospital room?: Total Help needed climbing 3-5 steps with a railing? : Total 6 Click Score: 10    End of Session Equipment Utilized During Treatment: Other (comment) (bed pad) Activity Tolerance: Patient tolerated treatment well;Patient limited by fatigue Patient left: in chair;with call bell/phone within reach;with chair alarm set Nurse Communication: Mobility status PT  Visit Diagnosis: Other abnormalities of gait and mobility (R26.89);Difficulty in walking, not elsewhere classified (R26.2);Muscle weakness (generalized) (M62.81)    Time: 8938-1017 PT Time Calculation (min) (ACUTE ONLY): 36 min   Charges:   PT Evaluation $PT Re-evaluation: 1 Re-eval PT Treatments $Therapeutic Activity: 8-22 mins        Van Clines, PT  Acute Rehabilitation Services Office [phone]   Levi Aland 05/02/2022, 3:04 PM

## 2022-05-02 NOTE — Progress Notes (Signed)
PROGRESS NOTE/consult    Jeffrey Fitzgerald  OIB:[id number] DOB: 1971-04-04 DOA: 04/01/2022 PCP: Patient, No Pcp Per    No chief complaint on file.   Brief Narrative:   Jeffrey Fitzgerald is a 51 y.o. male with past medical history of hypertension, peripheral vascular disease, and tobacco abuse who presented initially for critical left lower limb ischemia 8/3 requiring aortobifemoral bypass.  He then underwent left common femoral and posterior tibial bypass on 8/9 same-day for right common revision of the right lower extremity aortobifemoral bypass.  He returned to the OR on 8/11 and underwent thrombectomy of left common femoral to posterior tibial artery bypass, right lower extremity fasciotomy closure, and wound VAC placement.  White blood cell count had trended up to 24.8 and the patient has been temporarily treated empirically with Zosyn.  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  His hospital stay had been complicated by hemorrhagic bleed with hemoglobin trending down to 6 g/dL requiring 2 units of packed red blood cells and creatinine trended up to 3.59 before trending down.    He has been complaining of pain out of left leg which was noted to be cold and contracted.  Vascular had discussed need of left above-knee amputation, but he was not willing to undergo amputation of the left leg.  Palliative care had been consulted.  Patient was noted to become more tachycardic and hypertensive with blood pressures elevated into the 200s at times.  He denies any complaints of any chest pain, nausea, vomiting, or diarrhea.  Patient's white blood cell counts have been otherwise trending down.  TRH consulted to help with medical management.   Assessment & Plan:   Principal Problem:   Critical limb ischemia of  both lower extremities (HCC) Active Problems:   PAD (peripheral artery disease) (HCC)   Acute renal failure (HCC)   Other specified anemias   Pressure injury of skin   SIRS (systemic inflammatory response syndrome) (HCC)   Hypertensive urgency   Hypokalemia   Gastroesophageal reflux disease   Acute postoperative anemia due to expected blood loss  #1 critical limb ischemia -Patient presented with critical limb ischemia bilateral lower extremities required several attempts at revascularization. -Patient subsequently underwent right AKA on 04/22/2022. -Vascular surgery/primary team recommended left AKA however patient initially declined. -Palliative care consulted and following I spoke with patient and family and patient decided to have left AKA which was done on 04/30/2022 per vascular surgery. -Continue aspirin, statin. -Pain management per primary team. -Per vascular surgery.  2.  SIRS -Patient noted to be tachycardic, tachypneic with a leukocytosis white count of 18.5 with noted to be trending down. -Prior blood cultures done with no growth to date. -Patient noted to have been on Zosyn which has subsequently been discontinued. -UA noted moderate hemoglobin, small leukocytes, rare bacteria, 6-10 WBCs.  Patient with no urinary symptoms. -Right lower extremity wound appears to be healing well. -Patient with no respiratory symptoms. -Urine cultures pending. -Leukocytosis trending down and seems to have normalized today. -Blood cultures with no growth to date x5 days. -Follow.  3.  Hypertensive urgency -Patient noted to have blood pressure elevated 198/86 with heart rates in the 120s. -Likely a component of pain contributing to hypertensive urgency. -Patient noted to not be on any antihypertensive medications prior to admission. -Patient noted to have  been on hydralazine 10 mg twice daily however did not obtain adequate blood pressure control. -Continue Coreg 6.25 mg twice daily,  Norvasc 10 mg daily, hydralazine 10 mg twice daily. -Uptitrate antihypertensive medications for better blood pressure control if needed.  4.  Hypokalemia -Repleted.   -Potassium of 4.4  5.  Acute renal failure -Baseline creatinine approximately 1-1.2. -Renal function noted to trend up to 3.59 on 04/11/2022 and felt likely secondary to contrast given. -Renal function improving with creatinine trending down with hydration currently at 1.50.   -Urine output 800 cc recorded over the past 24 hours. -Follow.  6.  Urinary frequency -Patient noted to have urinary frequency without dysuria. -Urine cultures pending.  7.  Tobacco abuse -Tobacco cessation. -Nicotine patch.  8.  GERD -Continue PPI.  9.  Postop acute blood loss anemia -Hemoglobin at 7.7 this morning. -Repeat H&H this afternoon. -Transfusion threshold hemoglobin < 7.   DVT prophylaxis: Per primary team Code Status: Full Family Communication: Updated patient.  No family at bedside. Disposition: Per primary team.  Status is: Inpatient Remains inpatient appropriate because: Severity of illness   Consultants:  Cardiology: Dr. Antoine Poche 04/01/2022 Wound care RN, Helmut Muster 04/09/2022 Palliative care: Albesa Seen, NP 04/21/2022 [facility]: Dr. Katrinka Blazing 04/27/2022  Procedures:  Abdominal films 04/07/2022, 04/12/2022 Chest x-ray 04/07/2022, 04/18/2022 Myoview stress test 04/02/2022 2D echo 04/02/2022 Right AKA per vascular surgery: Dr. Myra Gianotti 04/22/2022 Reexploration and washout of right groin wound less than 30 days postop, sharp excisional debridement of right groin wound, repair of aortobifemoral to profunda femoris artery bypass with pledgeted suture, right groin sartorius muscle flap coverage, application negative pressure dressing per vascular surgery: Dr. Randie Heinz 04/20/2022 Right leg fasciotomy washout, soft tissue debridement, right groin washout, soft tissue debridement, VAC placement per vascular surgery: Dr. Sherral Hammers  04/20/2022 Thrombectomy of left common femoral to posterior tibial artery bypass, closure of right medial calf fasciotomy, application of negative pressure wound VAC greater right lateral calf fasciotomy per vascular surgery: Dr. Chestine Spore 04/09/2022 Right greater saphenous vein harvest, right common femoral to posterior tibial artery bypass with 6 mm PTFE/greater saphenous vein composite graft, right lower extremity angiogram, right lower extremity 4 compartment fasciotomy, right posterior tibial artery thrombectomy per Dr. Lenell Antu, Dr. Chestine Spore vascular surgery 04/08/2022 Redo exploration of the right groin/right common femoral artery thrombectomy/extensive endarterectomy of right profunda with bovine pericardial patch angioplasty, revision of right limb of the aortobifemoral bypass graft 04/07/2022 per Dr. Randie Heinz. Aortobifemoral bypass, right common femoral endarterectomy with profundoplasty, left common femoral endarterectomy with profundoplasty, harvest of left leg great saphenous vein, left common femoral to posterior tibial bypass with composite PTFE and great saphenous vein per vascular surgery: Dr. Chestine Spore 04/07/2022 Aortobifemoral bypass, bilateral common femoral and profunda femoris endarterectomies, harvest left greater saphenous vein, left common femoral to posterior tibial artery bypass with composite 6 mm E PTFE in reverse greater saphenous vein and anatomic tunnel by Dr. Chestine Spore at [place] [facility] 04/07/2022 Attempted ultrasound-guided access left common femoral artery, ultrasound-guided access right common femoral artery, aortogram with catheter selection of the aorta, bilateral lower extremity arteriogram with runoff, 32 minutes monitored moderate conscious sedation time Per vascular surgery: Dr. Chestine Spore 04/01/2022 Left AKA per vascular surgery: Dr. Randie Heinz 04/30/2022  Antimicrobials:  Anti-infectives (From admission, onward)    Start     Dose/Rate Route Frequency Ordered Stop   04/21/22 1315  vancomycin  (VANCOCIN) IVPB 1000 mg/200 mL premix  Status:  Discontinued        1,000 mg 200 mL/hr over 60  Minutes Intravenous Every 24 hours 04/21/22 0933 04/27/22 1020   04/19/22 1315  vancomycin (VANCOREADY) IVPB 750 mg/150 mL  Status:  Discontinued        750 mg 150 mL/hr over 60 Minutes Intravenous Every 24 hours 04/19/22 1218 04/21/22 0933   04/18/22 1000  piperacillin-tazobactam (ZOSYN) IVPB 3.375 g  Status:  Discontinued        3.375 g 12.5 mL/hr over 240 Minutes Intravenous Every 8 hours 04/18/22 0925 04/27/22 1020   04/08/22 2200  linezolid (ZYVOX) IVPB 600 mg        600 mg 300 mL/hr over 60 Minutes Intravenous Every 12 hours 04/08/22 1622 04/15/22 2359   04/08/22 0000  ceFAZolin (ANCEF) IVPB 2g/100 mL premix        2 g 200 mL/hr over 30 Minutes Intravenous Every 8 hours 04/07/22 2316 04/08/22 1559   04/07/22 1529  ceFAZolin (ANCEF) IVPB 2g/100 mL premix  Status:  Discontinued        2 g 200 mL/hr over 30 Minutes Intravenous 30 min pre-op 04/07/22 1530 04/07/22 2005   04/06/22 0932  vancomycin (VANCOREADY) IVPB 750 mg/150 mL  Status:  Discontinued        750 mg 150 mL/hr over 60 Minutes Intravenous Every 12 hours 04/06/22 0903 04/08/22 1634   04/05/22 2200  vancomycin (VANCOREADY) IVPB 1250 mg/250 mL  Status:  Discontinued        1,250 mg 166.7 mL/hr over 90 Minutes Intravenous Every 48 hours 04/04/22 1728 04/05/22 1023   04/05/22 1800  vancomycin (VANCOREADY) IVPB 1250 mg/250 mL  Status:  Discontinued        1,250 mg 166.7 mL/hr over 90 Minutes Intravenous Every 24 hours 04/05/22 1023 04/06/22 0903   04/02/22 2200  vancomycin (VANCOREADY) IVPB 750 mg/150 mL  Status:  Discontinued        750 mg 150 mL/hr over 60 Minutes Intravenous Every 12 hours 04/02/22 0853 04/04/22 1728   04/02/22 1000  piperacillin-tazobactam (ZOSYN) IVPB 3.375 g        3.375 g 12.5 mL/hr over 240 Minutes Intravenous Every 8 hours 04/02/22 0828 04/15/22 2359   04/02/22 1000  vancomycin (VANCOREADY) IVPB 1500  mg/300 mL        1,500 mg 150 mL/hr over 120 Minutes Intravenous  Once 04/02/22 0828 04/02/22 1141         Subjective: Sitting up in bed.  Watching something on his phone.  Denies any chest pain.  No shortness of breath.  No abdominal pain.  Poor to fair oral intake, eating about 50% of his meals.   Objective: Vitals:   05/01/22 1930 05/01/22 2347 05/02/22 0402 05/02/22 0805  BP: (!) 146/70 (!) 166/85 138/71 (!) 153/75  Pulse: 96 (!) 105 (!) 107 (!) 101  Resp: 14 18 13 14   Temp: 98.2 F (36.8 C) 98.2 F (36.8 C) 98.9 F (37.2 C) 98.2 F (36.8 C)  TempSrc: Oral Oral Oral Oral  SpO2: 100% 100% 100% 100%  Weight:      Height:        Intake/Output Summary (Last 24 hours) at 05/02/2022 1722 Last data filed at 05/02/2022 0423 Gross per 24 hour  Intake --  Output 900 ml  Net -900 ml   Filed Weights   04/09/22 0830 04/22/22 0831 04/30/22 0825  Weight: 68.8 kg 65.8 kg 65.8 kg    Examination:  General exam: NAD. Respiratory system: Lungs clear to auscultation bilaterally.  No wheezes, no crackles, no rhonchi.  Normal respiratory effort.  Cardiovascular system: Regular rate rhythm no murmurs rubs or gallops.  No JVD.  Gastrointestinal system: Abdomen is soft, nontender, nondistended, positive bowel sounds.    Central nervous system: Alert and oriented. No focal neurological deficits. Extremities: Status post right AKA with incision site c/d/i.  Status post left AKA with incision site c/d/I, with staples intact.  Skin: No rashes, lesions or ulcers Psychiatry: Judgement and insight normal.  Mood and affect appropriate.    Data Reviewed: I have personally reviewed following labs and imaging studies  CBC: Recent Labs  Lab 04/28/22 0604 04/29/22 0237 04/30/22 1223 05/01/22 0202 05/01/22 1611 05/02/22 0147  WBC 11.9* 10.2 15.6* 10.6*  --  9.8  NEUTROABS  --  7.7 13.8* 7.5  --   --   HGB 8.6* 8.5* 9.1* 7.8* 8.0* 7.7*  HCT 25.9* 26.1* 27.0* 24.2* 25.2* 24.6*  MCV 89.0  89.1 88.2 90.0  --  90.8  PLT 378 407* 430* 420*  --  390    Basic Metabolic Panel: Recent Labs  Lab 04/28/22 0604 04/29/22 0237 04/30/22 1223 05/01/22 0202 05/02/22 0147  NA 136 135 137 136 138  K 4.1 3.5 4.6 4.5 4.4  CL 106 104 101 105 109  CO2 21* 24 21* 21* 22  GLUCOSE 98 104* 120* 99 108*  BUN 11 9 12 17 16   CREATININE 1.68* 1.46* 1.72* 1.72* 1.50*  CALCIUM 8.2* 8.3* 8.8* 8.1* 8.2*  MG 2.0  --  2.0  --   --     GFR: Estimated Creatinine Clearance: 51.3 mL/min (A) (by C-G formula based on SCr of 1.5 mg/dL (H)).  Liver Function Tests: No results for input(s): "AST", "ALT", "ALKPHOS", "BILITOT", "PROT", "ALBUMIN" in the last 168 hours.  CBG: Recent Labs  Lab 04/29/22 1831 04/29/22 2129 04/30/22 0632 04/30/22 1216 04/30/22 1558  GLUCAP 98 111* 113* 119* 157*     No results found for this or any previous visit (from the past 240 hour(s)).       Radiology Studies: No results found.      Scheduled Meds:  amLODipine  10 mg Oral Daily   aspirin EC  81 mg Oral Daily   atorvastatin  80 mg Oral Daily   carvedilol  6.25 mg Oral BID WC   docusate sodium  100 mg Oral Daily   hydrALAZINE  10 mg Oral BID   nicotine  21 mg Transdermal Daily   pantoprazole  40 mg Oral Daily   sodium chloride flush  3 mL Intravenous Q12H   Continuous Infusions:  sodium chloride 180 mL/hr at 04/20/22 0944   sodium chloride 10 mL/hr at 04/25/22 1616     LOS: 31 days    Time spent: 35 minutes    04/27/22, MD [facility]   To contact the attending provider between 7A-7P or the covering provider during after hours 7P-7A, please log into the web site [email or web address] and access using universal [place] password for that web site. If you do not have the password, please call the hospital operator.  05/02/2022, 5:22 PM

## 2022-05-02 NOTE — Progress Notes (Addendum)
  Progress Note    05/02/2022 10:02 AM 2 Days Post-Op  Subjective: Patient denies any pain  Vitals:   05/02/22 0402 05/02/22 0805  BP: 138/71 (!) 153/75  Pulse: (!) 107 (!) 101  Resp: 13 14  Temp: 98.9 F (37.2 C) 98.2 F (36.8 C)  SpO2: 100% 100%    Physical Exam: Lungs: Nonlabored Incisions: Right AKA incision intact with staples.  Left AKA dressing taken down today, healing well so far and intact with staples.  Wound VAC right groin with good seal Extremities: Moving freely   CBC    Component Value Date/Time   WBC 9.8 05/02/2022 0147   RBC 2.71 (L) 05/02/2022 0147   HGB 7.7 (L) 05/02/2022 0147   HCT 24.6 (L) 05/02/2022 0147   PLT 390 05/02/2022 0147   MCV 90.8 05/02/2022 0147   MCH 28.4 05/02/2022 0147   MCHC 31.3 05/02/2022 0147   RDW 15.8 (H) 05/02/2022 0147   LYMPHSABS 1.8 05/01/2022 0202   MONOABS 1.2 (H) 05/01/2022 0202   EOSABS 0.0 05/01/2022 0202   BASOSABS 0.0 05/01/2022 0202    BMET    Component Value Date/Time   NA 138 05/02/2022 0147   K 4.4 05/02/2022 0147   CL 109 05/02/2022 0147   CO2 22 05/02/2022 0147   GLUCOSE 108 (H) 05/02/2022 0147   BUN 16 05/02/2022 0147   CREATININE 1.50 (H) 05/02/2022 0147   CALCIUM 8.2 (L) 05/02/2022 0147   GFRNONAA 56 (L) 05/02/2022 0147   GFRAA >60 01/12/2020 1421    INR    Component Value Date/Time   INR 1.2 04/11/2022 0437     Intake/Output Summary (Last 24 hours) at 05/02/2022 1002 Last data filed at 05/02/2022 0423 Gross per 24 hour  Intake --  Output 900 ml  Net -900 ml     Assessment/Plan:  51 y.o. male is 2 days postop, s/p: L AKA  -Bilateral AKAs look good, intact with staples -Pain well controlled with oral pain meds -Right groin wound VAC with good seal.  Wound care team will change VAC on Monday, 9 4 -No leukocytosis. WBC stable at 9.8 -Creatinine down from 1.72 yesterday to 1.5 today, will continue to monitor -Hgb at 7.7 today, will continue to follow -We would appreciate follow-up  evaluation by PT/OT -Continue aspirin and statin -CBC and BMP ordered for tomorrow  Loel Dubonnet, PA-C [facility] [phone] 05/02/2022 10:02 AM  I have seen and evaluated the patient. I agree with the PA note as documented above. Status post aortobifemoral bypass with bilateral lower extremity fem-tib bypasses.  Ultimately his right calf muscle became necrotic and he required right AKA.  His left leg bypass failed after multiple revisions.  Required left AKA Friday.  Left AKA dressing removed today and looks great.  Right AKA looks good and appears to be healing well with staples.  Midline and left groin incision look great.  Right groin getting changed by wound care and will be changed again tomorrow.  Have asked PT to see him again for dispo given bilateral above-knee amputations.  Previous recommendation for CIR.    Cephus Shelling, MD [facility] [place] Office: [phone]

## 2022-05-03 LAB — BASIC METABOLIC PANEL
Anion gap: 10 (ref 5–15)
BUN: 15 mg/dL (ref 6–20)
CO2: 24 mmol/L (ref 22–32)
Calcium: 8.5 mg/dL — ABNORMAL LOW (ref 8.9–10.3)
Chloride: 103 mmol/L (ref 98–111)
Creatinine, Ser: 1.41 mg/dL — ABNORMAL HIGH (ref 0.61–1.24)
GFR, Estimated: >60 mL/min (ref >60)
Glucose, Bld: 107 mg/dL — ABNORMAL HIGH (ref 70–99)
Potassium: 4.5 mmol/L (ref 3.5–5.1)
Sodium: 137 mmol/L (ref 135–145)

## 2022-05-03 LAB — CBC
HCT: 24.5 % — ABNORMAL LOW (ref 39.0–52.0)
Hemoglobin: 7.8 g/dL — ABNORMAL LOW (ref 13.0–17.0)
MCH: 28.6 pg (ref 26.0–34.0)
MCHC: 31.8 g/dL (ref 30.0–36.0)
MCV: 89.7 fL (ref 80.0–100.0)
Platelets: 370 10*3/uL (ref 150–400)
RBC: 2.73 MIL/uL — ABNORMAL LOW (ref 4.22–5.81)
RDW: 15.8 % — ABNORMAL HIGH (ref 11.5–15.5)
WBC: 9.4 10*3/uL (ref 4.0–10.5)
nRBC: 0 % (ref 0.0–0.2)

## 2022-05-03 MED ORDER — HYDRALAZINE HCL 25 MG PO TABS
25.0000 mg | ORAL_TABLET | Freq: Two times a day (BID) | ORAL | Status: DC
  Administered 2022-05-03 – 2022-05-19 (×31): 25 mg via ORAL
  Filled 2022-05-03 (×31): qty 1

## 2022-05-03 NOTE — Progress Notes (Signed)
Palliative Medicine Progress Note   Patient Name: Jeffrey Fitzgerald       Date: 05/03/2022 DOB: Jun 25, 1971  Age: 51 y.o. MRN#: [id number] Attending Physician: Cephus Shelling, MD Primary Care Physician: Patient, No Pcp Per Admit Date: 04/01/2022   HPI/Patient Profile: 51 y.o. male  with past medical history of peripheral vascular disease, hypertension, and tobacco abuse. He was admitted on 04/01/2022 with critical left lower limb ischemia requiring aortobifem bypass. He then had a left common femoral to posterior tibial bypass on 8/9. Returned to OR the same day for right common femoral thrombectomy and revision of the right lower extremity aortobifem bypass. Returned to OR 8/11 for left common femoral thrombectomy, right lower extremity fasciotomy closure and wound VAC placement.   Patient underwent right AKA on 04/22/22 and later underwent left AKA on 04/30/22.    Palliative Medicine was consulted for goals of care in the setting of scheduled right AKA for 8/24.   Subjective: Chart reviewed. Patient was evaluated by PT yesterday. He needed 2 person moderate assist for transfer OOB to recliner and he participated well. PT noted seemed to have good rehab potential and made recommendation for CIR.   I went to see patient at bedside. He is out of bed to the recliner. He denies pain. He tells me he is tired of sitting up and wants to get back in bed - I let his RN know. He does not seem to want to engage in much conversation about his current medical situation, but he does confirm that the plan is for rehab with the goal of improving functional status and then eventually to return home. I encouraged him to continue working hard with PT/OT.  I later spoke with his sister [name] by phone. We reviewed  patient's current medical situation and plan for rehab. We discussed that AKA (especially bilateral) is a major/life- changing event. [name] reports that Daaron is still working to accept and "come to terms" with his situation. Offered ongoing palliative support as needed.    Objective:  Physical Exam Vitals reviewed.  Constitutional:      General: He is not in acute distress.    Comments: Chronically ill-appearing  Pulmonary:     Effort: Pulmonary effort is normal.  Musculoskeletal:     Right Lower Extremity: Right leg is amputated above knee.  Left Lower Extremity: Left leg is amputated above knee.  Neurological:     Mental Status: He is alert and oriented to person, place, and time.  Psychiatric:        Mood and Affect: Affect is flat.             Vital Signs: BP 135/72 (BP Location: Left Arm)   Pulse (!) 103   Temp 98.4 F (36.9 C)   Resp 18   Ht 5\' 5"  (1.651 m)   Wt 65.8 kg   SpO2 95%   BMI 24.13 kg/m  SpO2: SpO2: 95 % O2 Device: O2 Device: Room Air O2 Flow Rate: O2 Flow Rate (L/min): 6 L/min    LBM: Last BM Date : 05/02/22     Palliative Assessment/Data: PPS 40%     Palliative Medicine Assessment & Plan   Assessment: Principal Problem:   Critical limb ischemia of both lower extremities (HCC) Active Problems:   PAD (peripheral artery disease) (HCC)   Acute renal failure (HCC)   Other specified anemias   Pressure injury of skin   SIRS (systemic inflammatory response syndrome) (HCC)   Hypertensive urgency   Hypokalemia   Gastroesophageal reflux disease   Acute postoperative anemia due to expected blood loss    Recommendations/Plan: Full code Continue full scope care Plan is for rehab to improve functional status and eventually return home PMT will continue to follow  Prognosis:  Unable to determine  Discharge Planning: To Be Determined   Thank you for allowing the Palliative Medicine Team to assist in the care of this patient.   MDM  - moderate   07/02/22, NP   Please contact Palliative Medicine Team phone at [phone] for questions and concerns.  For individual providers, please see AMION.

## 2022-05-03 NOTE — Consult Note (Signed)
WOC Nurse wound follow up Patient receiving care in [facility] 4E12. Patient did not require pre-medication prior to [facility] change to right thigh Wound type: surgical Measurement: na Wound bed: beefy red Drainage (amount, consistency, odor) serosanginous Periwound: intact Dressing procedure/placement/frequency: All black foam removed from wound bed. Wound encircled by 2 barrier rings, one piece of black foam placed over wound bed. Drape applied. Immediate seal obtained at 75 mmHg. Patient tolerated well.  [place] to order supplies for Wed. Helmut Muster, RN, MSN, CWOCN, CNS-BC, pager [phone]

## 2022-05-03 NOTE — Progress Notes (Signed)
RE-Evaluation Patient Details Name: Jeffrey Fitzgerald MRN: [id number] DOB: [birth year] Today's Date: 05/03/2022   History of Present Illness Pt is a 51 yo male admitted 04/01/22 from cath lab after aortogram for LLE ischemia. S/p aortobifemoral bypass graft with bilateral femoral endarterectomy on 8/9. Post op RLE ischemia with return to OR same date for redo R groin exploration with thrombectomy and angioplasty. S/p RLE fem-pop bypass graft with 4 compartment fasciotomy on 8/10. S/p thrombectomy left femoral to popliteal BPG wtih closure and right fasciotomy wtih VAC on right LE on 8/11. Ileus with emesis on 8/14. 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   Clinical Impression   Pt currently mod assist for dressing bed level rolling side to side with total assist for posterior transfer into the recliner.  Increased time needed for completion of all tasks with flat affect and slower problem solving.  Half way between transferring to the recliner pt stated "I don't want to do this".  Therapist encouraged him to complete transfer and assisted him.  Feel pt would not tolerate the level of intensity to participate fully in AIR level therapies.  Feel SNF level would fit him better based on last couple of treatments.  Will update of pt becomes more appropriate.  Recommend continued acute care OT at this time to help increase understanding and completion of ADLs since bilateral BKAs.       Recommendations for follow up therapy are one component of a multi-disciplinary discharge planning process, led by the attending physician.  Recommendations may be updated based on patient status, additional functional criteria and insurance authorization.   Follow Up Recommendations  Skilled nursing-short term rehab (<3 hours/day)    Assistance Recommended at Discharge Frequent or constant Supervision/Assistance  Patient can return home with the following Two people to help with walking and/or  transfers;A lot of help with bathing/dressing/bathroom;Direct supervision/assist for medications management;Assist for transportation;Help with stairs or ramp for entrance       Equipment Recommendations  Other (comment) (TBD next venue of care)    Recommendations for Other Services Rehab consult     Precautions / Restrictions Precautions Precautions: Fall Restrictions Weight Bearing Restrictions: Yes RLE Weight Bearing: Non weight bearing LLE Weight Bearing: Non weight bearing      Mobility Bed Mobility Overal bed mobility: Needs Assistance Bed Mobility: Rolling Rolling: Min assist (with rails)   Supine to sit: Mod assist, HOB elevated (HOB elevated 45 degrees.)          Transfers Overall transfer level: Needs assistance Equipment used: None Transfers: Bed to chair/wheelchair/BSC Sit to Stand: Total assist                  Balance Overall balance assessment: Needs assistance Sitting-balance support: No upper extremity supported Sitting balance-Leahy Scale: Poor Sitting balance - Comments: LOB to the side with initial long sitting in the bed                                   ADL either performed or assessed with clinical judgement   ADL Overall ADL's : Needs assistance/impaired             Lower Body Bathing: Bed level;Moderate assistance       Lower Body Dressing: Moderate assistance;Bed level   Toilet Transfer: Requires drop arm;Total assistance Toilet Transfer Details (indicate cue type and reason): posterior scoot to the recliner  Functional mobility during ADLs: Total assistance General ADL Comments: Pt needed increased time for donning underpants in bed after using the urinal in preparation for transfer to the recliner.  Increased time also needed to complete posterior transfer with mod encouragement.  Pt with decreased initiation and problem solving to complete task in a timely manner.     Vision Baseline  Vision/History: 0 No visual deficits Vision Assessment?: No apparent visual deficits     Perception Perception Perception: Within Functional Limits   Praxis Praxis Praxis: Intact    Pertinent Vitals/Pain Pain Assessment Pain Score: 0-No pain (per patient report when asked) Faces Pain Scale: No hurt Pain Descriptors / Indicators: Grimacing Pain Intervention(s): Monitored during session     Hand Dominance     Extremity/Trunk Assessment Upper Extremity Assessment Upper Extremity Assessment: Generalized weakness   Lower Extremity Assessment Lower Extremity Assessment: Defer to PT evaluation       Communication     Cognition Arousal/Alertness: Awake/alert Behavior During Therapy: Flat affect Overall Cognitive Status: Impaired/Different from baseline Area of Impairment: Problem solving, Awareness                   Current Attention Level: Sustained   Following Commands: Follows one step commands with increased time, Follows multi-step commands with increased time Safety/Judgement: Decreased awareness of safety, Decreased awareness of deficits   Problem Solving: Decreased initiation, Requires verbal cues, Requires tactile cues            OT Problem List: Decreased strength;Decreased range of motion;Decreased activity tolerance;Impaired balance (sitting and/or standing);Decreased safety awareness;Decreased knowledge of use of DME or AE;Cardiopulmonary status limiting activity;Impaired sensation;Pain;Increased edema      OT Treatment/Interventions: Self-care/ADL training;Therapeutic exercise;DME and/or AE instruction;Therapeutic activities;Patient/family education;Balance training    OT Goals(Current goals can be found in the care plan section) Acute Rehab OT Goals Patient Stated Goal: Pt agreed to get OOB OT Goal Formulation: With patient Time For Goal Achievement: 05/17/22 Potential to Achieve Goals: Fair  OT Frequency: Min 2X/week    Co-evaluation               AM-PAC OT "6 Clicks" Daily Activity     Outcome Measure Help from another person eating meals?: None Help from another person taking care of personal grooming?: A Little Help from another person toileting, which includes using toliet, bedpan, or urinal?: Total Help from another person bathing (including washing, rinsing, drying)?: Total Help from another person to put on and taking off regular upper body clothing?: A Little Help from another person to put on and taking off regular lower body clothing?: Total 6 Click Score: 13   End of Session Nurse Communication: Need for lift equipment  Activity Tolerance:   Patient left:    OT Visit Diagnosis: Unsteadiness on feet (R26.81);Other abnormalities of gait and mobility (R26.89);Muscle weakness (generalized) (M62.81);Other symptoms and signs involving cognitive function                Time: 7412-8786 OT Time Calculation (min): 30 min Charges:  OT General Charges $OT Visit: 1 Visit OT Treatments $Self Care/Home Management : 23-37 mins  Tashawn Laswell OTR/L 05/03/2022, 1:02 PM

## 2022-05-03 NOTE — Plan of Care (Signed)

## 2022-05-03 NOTE — Progress Notes (Signed)
Inpatient Rehab Admissions Coordinator:    I spoke with Pt. Regarding potential CIR admit. Let him know that he will need to work consistently with therapy and begin doing more for himself in order to be considered. I will follow for 1-2 more sessions before  deciding whether or not to sign off/pursue admit.   Megan Salon, MS, CCC-SLP Rehab Admissions Coordinator  [phone] (celll) [phone] (office)

## 2022-05-03 NOTE — TOC Progression Note (Signed)
Transition of Care [facility]) - Progression Note    Patient Details  Name: Jeffrey Fitzgerald MRN: [id number] Date of Birth: 10/23/1970  [facility] CM/SW Contact  Eduard Roux, [place] Phone Number: 05/03/2022, 1:49 PM  Clinical Narrative:     Patient currently does not have a payor source- medicaid pending  TOC will continue to follow and assist with discharge needs.  Expected Discharge Plan: IP Rehab Facility Barriers to Discharge: Continued Medical Work up  Expected Discharge Plan and Services Expected Discharge Plan: IP Rehab Facility In-house Referral: Clinical Social Work Discharge Planning Services: CM Consult   Living arrangements for the past 2 months: Single Family Home                                       Social Determinants of Health (SDOH) Interventions    Readmission Risk Interventions     No data to display

## 2022-05-03 NOTE — Progress Notes (Signed)
PROGRESS NOTE/consult    Jeffrey Fitzgerald  XVQ:[id number] DOB: 1971/01/08 DOA: 04/01/2022 PCP: Patient, No Pcp Per    No chief complaint on file.   Brief Narrative:   Jeffrey Fitzgerald is a 51 y.o. male with past medical history of hypertension, peripheral vascular disease, and tobacco abuse who presented initially for critical left lower limb ischemia 8/3 requiring aortobifemoral bypass.  He then underwent left common femoral and posterior tibial bypass on 8/9 same-day for right common revision of the right lower extremity aortobifemoral bypass.  He returned to the OR on 8/11 and underwent thrombectomy of left common femoral to posterior tibial artery bypass, right lower extremity fasciotomy closure, and wound VAC placement.  White blood cell count had trended up to 24.8 and the patient has been temporarily treated empirically with Zosyn.  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  His hospital stay had been complicated by hemorrhagic bleed with hemoglobin trending down to 6 g/dL requiring 2 units of packed red blood cells and creatinine trended up to 3.59 before trending down.    He has been complaining of pain out of left leg which was noted to be cold and contracted.  Vascular had discussed need of left above-knee amputation, but he was not willing to undergo amputation of the left leg.  Palliative care had been consulted.  Patient was noted to become more tachycardic and hypertensive with blood pressures elevated into the 200s at times.  He denies any complaints of any chest pain, nausea, vomiting, or diarrhea.  Patient's white blood cell counts have been otherwise trending down.  TRH consulted to help with medical management.   Assessment & Plan:   Principal Problem:   Critical limb ischemia of  both lower extremities (HCC) Active Problems:   PAD (peripheral artery disease) (HCC)   Acute renal failure (HCC)   Other specified anemias   Pressure injury of skin   SIRS (systemic inflammatory response syndrome) (HCC)   Hypertensive urgency   Hypokalemia   Gastroesophageal reflux disease   Acute postoperative anemia due to expected blood loss  #1 critical limb ischemia -Patient presented with critical limb ischemia bilateral lower extremities required several attempts at revascularization. -Patient subsequently underwent right AKA on 04/22/2022. -Vascular surgery/primary team recommended left AKA however patient initially declined. -Palliative care consulted and following I spoke with patient and family and patient decided to have left AKA which was done on 04/30/2022 per vascular surgery. -Continue aspirin, statin. -Pain management per primary team. -Per vascular surgery.  2.  SIRS -Patient noted to be tachycardic, tachypneic with a leukocytosis white count of 18.5 with noted to be trending down. -Prior blood cultures done with no growth to date. -Patient noted to have been on Zosyn which has subsequently been discontinued. -UA noted moderate hemoglobin, small leukocytes, rare bacteria, 6-10 WBCs.  Patient with no urinary symptoms. -Right lower extremity wound appears to be healing well. -Patient with no respiratory symptoms. -Urine cultures pending. -Leukocytosis trended down and seems to have normalized. -Blood cultures with no growth to date x5 days. -Follow.  3.  Hypertensive urgency -Patient noted to have blood pressure elevated 198/86 with heart rates in the 120s. -Likely a component of pain contributing to hypertensive urgency. -Patient noted to not be on any antihypertensive medications prior to admission. -Patient noted to have been  on hydralazine 10 mg twice daily however did not obtain adequate blood pressure control. -Continue Coreg 6.25 mg twice daily, Norvasc 10  mg daily. -Increase hydralazine to 25 mg twice daily. -Uptitrate antihypertensive medications for better blood pressure control if needed.  4.  Hypokalemia -Repleted.   -Potassium of 4.5.  5.  Acute renal failure -Baseline creatinine approximately 1-1.2. -Renal function noted to trend up to 3.59 on 04/11/2022 and felt likely secondary to contrast given. -Renal function improving with creatinine trending down with hydration currently at 1.41.   -Urine output 1000 cc recorded over the past 24 hours. -Follow.  6.  Urinary frequency -Patient noted to have urinary frequency without dysuria. -Urine cultures pending.  7.  Tobacco abuse -Tobacco cessation. -Nicotine patch.  8.  GERD -PPI.  9.  Postop acute blood loss anemia -Hemoglobin at 7.8 this morning. -Repeat H&H this afternoon. -Transfusion threshold hemoglobin < 7.   DVT prophylaxis: Per primary team Code Status: Full Family Communication: Updated patient.  No family at bedside. Disposition: Per primary team.  Status is: Inpatient Remains inpatient appropriate because: Severity of illness   Consultants:  Cardiology: Dr. Antoine Poche 04/01/2022 Wound care RN, Helmut Muster 04/09/2022 Palliative care: Albesa Seen, NP 04/21/2022 [facility]: Dr. Katrinka Blazing 04/27/2022  Procedures:  Abdominal films 04/07/2022, 04/12/2022 Chest x-ray 04/07/2022, 04/18/2022 Myoview stress test 04/02/2022 2D echo 04/02/2022 Right AKA per vascular surgery: Dr. Myra Gianotti 04/22/2022 Reexploration and washout of right groin wound less than 30 days postop, sharp excisional debridement of right groin wound, repair of aortobifemoral to profunda femoris artery bypass with pledgeted suture, right groin sartorius muscle flap coverage, application negative pressure dressing per vascular surgery: Dr. Randie Heinz 04/20/2022 Right leg fasciotomy washout, soft tissue debridement, right groin washout, soft tissue debridement, VAC placement per vascular surgery: Dr. Sherral Hammers  04/20/2022 Thrombectomy of left common femoral to posterior tibial artery bypass, closure of right medial calf fasciotomy, application of negative pressure wound VAC greater right lateral calf fasciotomy per vascular surgery: Dr. Chestine Spore 04/09/2022 Right greater saphenous vein harvest, right common femoral to posterior tibial artery bypass with 6 mm PTFE/greater saphenous vein composite graft, right lower extremity angiogram, right lower extremity 4 compartment fasciotomy, right posterior tibial artery thrombectomy per Dr. Lenell Antu, Dr. Chestine Spore vascular surgery 04/08/2022 Redo exploration of the right groin/right common femoral artery thrombectomy/extensive endarterectomy of right profunda with bovine pericardial patch angioplasty, revision of right limb of the aortobifemoral bypass graft 04/07/2022 per Dr. Randie Heinz. Aortobifemoral bypass, right common femoral endarterectomy with profundoplasty, left common femoral endarterectomy with profundoplasty, harvest of left leg great saphenous vein, left common femoral to posterior tibial bypass with composite PTFE and great saphenous vein per vascular surgery: Dr. Chestine Spore 04/07/2022 Aortobifemoral bypass, bilateral common femoral and profunda femoris endarterectomies, harvest left greater saphenous vein, left common femoral to posterior tibial artery bypass with composite 6 mm E PTFE in reverse greater saphenous vein and anatomic tunnel by Dr. Chestine Spore at [facility] 04/07/2022 Attempted ultrasound-guided access left common femoral artery, ultrasound-guided access right common femoral artery, aortogram with catheter selection of the aorta, bilateral lower extremity arteriogram with runoff, 32 minutes monitored moderate conscious sedation time Per vascular surgery: Dr. Chestine Spore 04/01/2022 Left AKA per vascular surgery: Dr. Randie Heinz 04/30/2022  Antimicrobials:  Anti-infectives (From admission, onward)    Start     Dose/Rate Route Frequency Ordered Stop   04/21/22 1315  vancomycin  (VANCOCIN) IVPB 1000 mg/200 mL premix  Status:  Discontinued        1,000 mg 200 mL/hr over 60  Minutes Intravenous Every 24 hours 04/21/22 0933 04/27/22 1020   04/19/22 1315  vancomycin (VANCOREADY) IVPB 750 mg/150 mL  Status:  Discontinued        750 mg 150 mL/hr over 60 Minutes Intravenous Every 24 hours 04/19/22 1218 04/21/22 0933   04/18/22 1000  piperacillin-tazobactam (ZOSYN) IVPB 3.375 g  Status:  Discontinued        3.375 g 12.5 mL/hr over 240 Minutes Intravenous Every 8 hours 04/18/22 0925 04/27/22 1020   04/08/22 2200  linezolid (ZYVOX) IVPB 600 mg        600 mg 300 mL/hr over 60 Minutes Intravenous Every 12 hours 04/08/22 1622 04/15/22 2359   04/08/22 0000  ceFAZolin (ANCEF) IVPB 2g/100 mL premix        2 g 200 mL/hr over 30 Minutes Intravenous Every 8 hours 04/07/22 2316 04/08/22 1559   04/07/22 1529  ceFAZolin (ANCEF) IVPB 2g/100 mL premix  Status:  Discontinued        2 g 200 mL/hr over 30 Minutes Intravenous 30 min pre-op 04/07/22 1530 04/07/22 2005   04/06/22 0932  vancomycin (VANCOREADY) IVPB 750 mg/150 mL  Status:  Discontinued        750 mg 150 mL/hr over 60 Minutes Intravenous Every 12 hours 04/06/22 0903 04/08/22 1634   04/05/22 2200  vancomycin (VANCOREADY) IVPB 1250 mg/250 mL  Status:  Discontinued        1,250 mg 166.7 mL/hr over 90 Minutes Intravenous Every 48 hours 04/04/22 1728 04/05/22 1023   04/05/22 1800  vancomycin (VANCOREADY) IVPB 1250 mg/250 mL  Status:  Discontinued        1,250 mg 166.7 mL/hr over 90 Minutes Intravenous Every 24 hours 04/05/22 1023 04/06/22 0903   04/02/22 2200  vancomycin (VANCOREADY) IVPB 750 mg/150 mL  Status:  Discontinued        750 mg 150 mL/hr over 60 Minutes Intravenous Every 12 hours 04/02/22 0853 04/04/22 1728   04/02/22 1000  piperacillin-tazobactam (ZOSYN) IVPB 3.375 g        3.375 g 12.5 mL/hr over 240 Minutes Intravenous Every 8 hours 04/02/22 0828 04/15/22 2359   04/02/22 1000  vancomycin (VANCOREADY) IVPB 1500  mg/300 mL        1,500 mg 150 mL/hr over 120 Minutes Intravenous  Once 04/02/22 0828 04/02/22 1141         Subjective: Sleeping but arousable.  No chest pain.  No shortness of breath.  No abdominal pain.    Objective: Vitals:   05/02/22 2323 05/03/22 0407 05/03/22 0749 05/03/22 0809  BP: (!) 141/73 (!) 165/81 (!) 152/78   Pulse: 94 94 (!) 104 98  Resp: 13 13 20    Temp: 97.7 F (36.5 C) 98.7 F (37.1 C) 98 F (36.7 C)   TempSrc: Oral Axillary Oral   SpO2: 100% 99% 100%   Weight:      Height:        Intake/Output Summary (Last 24 hours) at 05/03/2022 1053 Last data filed at 05/03/2022 1000 Gross per 24 hour  Intake 123 ml  Output 1050 ml  Net -927 ml    Filed Weights   04/09/22 0830 04/22/22 0831 04/30/22 0825  Weight: 68.8 kg 65.8 kg 65.8 kg    Examination:  General exam: NAD Respiratory system: CTA B.  No wheezes, no crackles, no rhonchi.  Normal respiratory effort.  Speaking in full sentences.  Cardiovascular system: RRR no murmurs rubs or gallops.  No JVD.  Gastrointestinal system: Abdomen soft, nontender, nondistended, positive  bowel sounds.  No rebound.  No guarding.  Central nervous system: Alert and oriented. No focal neurological deficits. Extremities: Status post right AKA with incision site c/d/i.  Status post left AKA with incision site c/d/I, with staples intact.  Skin: No rashes, lesions or ulcers Psychiatry: Judgement and insight normal.  Mood and affect appropriate.    Data Reviewed: I have personally reviewed following labs and imaging studies  CBC: Recent Labs  Lab 04/29/22 0237 04/30/22 1223 05/01/22 0202 05/01/22 1611 05/02/22 0147 05/03/22 0132  WBC 10.2 15.6* 10.6*  --  9.8 9.4  NEUTROABS 7.7 13.8* 7.5  --   --   --   HGB 8.5* 9.1* 7.8* 8.0* 7.7* 7.8*  HCT 26.1* 27.0* 24.2* 25.2* 24.6* 24.5*  MCV 89.1 88.2 90.0  --  90.8 89.7  PLT 407* 430* 420*  --  390 370     Basic Metabolic Panel: Recent Labs  Lab 04/28/22 0604  04/29/22 0237 04/30/22 1223 05/01/22 0202 05/02/22 0147 05/03/22 0132  NA 136 135 137 136 138 137  K 4.1 3.5 4.6 4.5 4.4 4.5  CL 106 104 101 105 109 103  CO2 21* 24 21* 21* 22 24  GLUCOSE 98 104* 120* 99 108* 107*  BUN 11 9 12 17 16 15   CREATININE 1.68* 1.46* 1.72* 1.72* 1.50* 1.41*  CALCIUM 8.2* 8.3* 8.8* 8.1* 8.2* 8.5*  MG 2.0  --  2.0  --   --   --      GFR: Estimated Creatinine Clearance: 54.5 mL/min (A) (by C-G formula based on SCr of 1.41 mg/dL (H)).  Liver Function Tests: No results for input(s): "AST", "ALT", "ALKPHOS", "BILITOT", "PROT", "ALBUMIN" in the last 168 hours.  CBG: Recent Labs  Lab 04/29/22 1831 04/29/22 2129 04/30/22 0632 04/30/22 1216 04/30/22 1558  GLUCAP 98 111* 113* 119* 157*      No results found for this or any previous visit (from the past 240 hour(s)).       Radiology Studies: No results found.      Scheduled Meds:  amLODipine  10 mg Oral Daily   aspirin EC  81 mg Oral Daily   atorvastatin  80 mg Oral Daily   carvedilol  6.25 mg Oral BID WC   docusate sodium  100 mg Oral Daily   hydrALAZINE  25 mg Oral BID   nicotine  21 mg Transdermal Daily   pantoprazole  40 mg Oral Daily   sodium chloride flush  3 mL Intravenous Q12H   Continuous Infusions:  sodium chloride 180 mL/hr at 04/20/22 0944   sodium chloride 10 mL/hr at 04/25/22 1616     LOS: 32 days    Time spent: 35 minutes    04/27/22, MD [facility]   To contact the attending provider between 7A-7P or the covering provider during after hours 7P-7A, please log into the web site [email or web address] and access using universal [place] password for that web site. If you do not have the password, please call the hospital operator.  05/03/2022, 10:53 AM

## 2022-05-03 NOTE — Progress Notes (Signed)
Physical Therapy Treatment Patient Details Name: Jeffrey Fitzgerald MRN: [id number] DOB: [birth year] Today's Date: 05/03/2022   History of Present Illness Pt is a 51 yo male admitted 04/01/22 from cath lab after aortogram for LLE ischemia. S/p aortobifemoral bypass graft with bilateral femoral endarterectomy on 8/9. Post op RLE ischemia with return to OR same date for redo R groin exploration with thrombectomy and angioplasty. S/p RLE fem-pop bypass graft with 4 compartment fasciotomy on 8/10. S/p thrombectomy left femoral to popliteal BPG wtih closure and right fasciotomy wtih VAC on right LE on 8/11. Ileus with emesis on 8/14. 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    PT Comments    Pt received OOB in recliner and requesting to transfer back to bed. Pt able to perform anterior scoot transfer from edge of chair to bed with max assist. Pt with good weight shifting and leaning to initiate scoot however pt with decreased UE strength and unable to clear hips when pushing up. Plan to trial sliding board next session for transfers. Pt with fair tolerance for UE/LE therex for increased strength and ROM. Pt with good participation this session and current plan remains appropriate to address deficits and maximize functional independence and safety. Pt continues to benefit from skilled PT services to progress toward functional mobility goals.    Recommendations for follow up therapy are one component of a multi-disciplinary discharge planning process, led by the attending physician.  Recommendations may be updated based on patient status, additional functional criteria and insurance authorization.  Follow Up Recommendations  Acute inpatient rehab (3hours/day) Can patient physically be transported by private vehicle: No   Assistance Recommended at Discharge Frequent or constant Supervision/Assistance  Patient can return home with the following A lot of help with walking and/or transfers;A  lot of help with bathing/dressing/bathroom;Assistance with cooking/housework;Direct supervision/assist for medications management;Direct supervision/assist for financial management;Assist for transportation;Help with stairs or ramp for entrance   Equipment Recommendations  Wheelchair (measurements PT);Wheelchair cushion (measurements PT);Other (comment)    Recommendations for Other Services       Precautions / Restrictions Precautions Precautions: Fall Precaution Comments: watch HR Restrictions Weight Bearing Restrictions: Yes RLE Weight Bearing: Non weight bearing LLE Weight Bearing: Non weight bearing     Mobility  Bed Mobility Overal bed mobility: Needs Assistance Bed Mobility: Sit to Supine       Sit to supine: Min assist        Transfers Overall transfer level: Needs assistance Equipment used: None Transfers: Bed to chair/wheelchair/BSC Sit to Stand: Total assist           General transfer comment: performed antpost trasnfers chair>bed; pt with good lateral leans and wiehgt shifts to allow for scooting, pt limited in ability to self scoot anteriorly secondary to UE weakness    Ambulation/Gait                   Stairs             Wheelchair Mobility    Modified Rankin (Stroke Patients Only)       Balance Overall balance assessment: Needs assistance Sitting-balance support: No upper extremity supported Sitting balance-Leahy Scale: Poor Sitting balance - Comments: LOB to the side with initial long sitting in the bed                                    Cognition Arousal/Alertness: Awake/alert  Behavior During Therapy: Flat affect Overall Cognitive Status: Impaired/Different from baseline Area of Impairment: Problem solving, Awareness                   Current Attention Level: Sustained   Following Commands: Follows one step commands with increased time, Follows multi-step commands with increased  time Safety/Judgement: Decreased awareness of safety, Decreased awareness of deficits   Problem Solving: Decreased initiation, Requires verbal cues, Requires tactile cues          Exercises Amputee Exercises Quad Sets: AROM, Right, Left, 10 reps, Supine Hip Flexion/Marching: AROM, Right, Left, 10 reps, Supine Chair Push Up: AAROM, 5 reps, Seated    General Comments        Pertinent Vitals/Pain Pain Assessment Pain Assessment: Faces Faces Pain Scale: No hurt Pain Intervention(s): Monitored during session    Home Living                          Prior Function            PT Goals (current goals can now be found in the care plan section) Acute Rehab PT Goals PT Goal Formulation: With patient Time For Goal Achievement: 05/07/22    Frequency    Min 3X/week      PT Plan Current plan remains appropriate    Co-evaluation              AM-PAC PT "6 Clicks" Mobility   Outcome Measure  Help needed turning from your back to your side while in a flat bed without using bedrails?: None Help needed moving from lying on your back to sitting on the side of a flat bed without using bedrails?: A Lot Help needed moving to and from a bed to a chair (including a wheelchair)?: Total Help needed standing up from a chair using your arms (e.g., wheelchair or bedside chair)?: Total Help needed to walk in hospital room?: Total Help needed climbing 3-5 steps with a railing? : Total 6 Click Score: 10    End of Session   Activity Tolerance: Patient tolerated treatment well;Patient limited by fatigue Patient left: in bed;with call bell/phone within reach Nurse Communication: Mobility status PT Visit Diagnosis: Other abnormalities of gait and mobility (R26.89);Difficulty in walking, not elsewhere classified (R26.2);Muscle weakness (generalized) (M62.81)     Time: 2440-1027 PT Time Calculation (min) (ACUTE ONLY): 22 min  Charges:  $Therapeutic Activity: 8-22  mins                     Hallie Ertl R. PTA Acute Rehabilitation Services Office: [phone]    Catalina Antigua 05/03/2022, 2:42 PM

## 2022-05-03 NOTE — Progress Notes (Addendum)
  Progress Note    05/03/2022 8:14 AM 3 Days Post-Op  Subjective:  No complaints      Vitals:   05/03/22 0749 05/03/22 0809  BP: (!) 152/78   Pulse: (!) 104 98  Resp: 20   Temp: 98 F (36.7 C)   SpO2: 100%     Physical Exam: Lungs:  nonlabored Incisions:  Bilateral AKAs c/d/l with staples. R groin with wound vac with good seal Abdomen:  soft, nontender  CBC    Component Value Date/Time   WBC 9.4 05/03/2022 0132   RBC 2.73 (L) 05/03/2022 0132   HGB 7.8 (L) 05/03/2022 0132   HCT 24.5 (L) 05/03/2022 0132   PLT 370 05/03/2022 0132   MCV 89.7 05/03/2022 0132   MCH 28.6 05/03/2022 0132   MCHC 31.8 05/03/2022 0132   RDW 15.8 (H) 05/03/2022 0132   LYMPHSABS 1.8 05/01/2022 0202   MONOABS 1.2 (H) 05/01/2022 0202   EOSABS 0.0 05/01/2022 0202   BASOSABS 0.0 05/01/2022 0202    BMET    Component Value Date/Time   NA 137 05/03/2022 0132   K 4.5 05/03/2022 0132   CL 103 05/03/2022 0132   CO2 24 05/03/2022 0132   GLUCOSE 107 (H) 05/03/2022 0132   BUN 15 05/03/2022 0132   CREATININE 1.41 (H) 05/03/2022 0132   CALCIUM 8.5 (L) 05/03/2022 0132   GFRNONAA >60 05/03/2022 0132   GFRAA >60 01/12/2020 1421    INR    Component Value Date/Time   INR 1.2 04/11/2022 0437     Intake/Output Summary (Last 24 hours) at 05/03/2022 0814 Last data filed at 05/03/2022 0531 Gross per 24 hour  Intake 3 ml  Output 1050 ml  Net -1047 ml     Assessment/Plan:  51 y.o. male is 3 days post op, s/p: L AKA    -Bilateral AKAs healing well, intact with staples -R groin with wound vac with good seal. Wound care team has just changed the wound vac dressing today without issue. Groin is healing well. They will change again on 9/6 -Creatinine still trending down, at 1.41 today -Hgb stable at 7.8 -Physical therapy has seen the patient and has recommended acute inpatient rehab. Equipment recommended was wheelchair, wheelchair cushion, and sliding board. -Consult has been placed for CIR  eval  Loel Dubonnet, PA-C [facility] [phone] 05/03/2022 8:14 AM  I have seen and evaluated the patient. I agree with the PA note as documented above. Status post aortobifemoral bypass with bilateral lower extremity fem-tib bypasses.  Ultimately his right calf muscle became necrotic and he required right AKA.  His left leg bypass failed after multiple revisions.  Required left AKA Friday.  Left AKA dressing removed yesterday and looks great.  Right AKA looks good and appears to be healing well with staples.  Midline and left groin incision look great.  Right groin vac changed today.  PT recommending CIR.  Patient states he is amendable to CIR.  We will reengage rehab today to see if he is a candidate for admission.  Cephus Shelling, MD [facility] [place] Office: [phone]

## 2022-05-04 LAB — BASIC METABOLIC PANEL
Anion gap: 9 (ref 5–15)
BUN: 14 mg/dL (ref 6–20)
CO2: 23 mmol/L (ref 22–32)
Calcium: 8.1 mg/dL — ABNORMAL LOW (ref 8.9–10.3)
Chloride: 102 mmol/L (ref 98–111)
Creatinine, Ser: 1.55 mg/dL — ABNORMAL HIGH (ref 0.61–1.24)
GFR, Estimated: 54 mL/min — ABNORMAL LOW (ref >60)
Glucose, Bld: 104 mg/dL — ABNORMAL HIGH (ref 70–99)
Potassium: 4 mmol/L (ref 3.5–5.1)
Sodium: 134 mmol/L — ABNORMAL LOW (ref 135–145)

## 2022-05-04 LAB — SURGICAL PATHOLOGY

## 2022-05-04 LAB — CBC
HCT: 23.4 % — ABNORMAL LOW (ref 39.0–52.0)
Hemoglobin: 7.5 g/dL — ABNORMAL LOW (ref 13.0–17.0)
MCH: 28.5 pg (ref 26.0–34.0)
MCHC: 32.1 g/dL (ref 30.0–36.0)
MCV: 89 fL (ref 80.0–100.0)
Platelets: 361 10*3/uL (ref 150–400)
RBC: 2.63 MIL/uL — ABNORMAL LOW (ref 4.22–5.81)
RDW: 15.7 % — ABNORMAL HIGH (ref 11.5–15.5)
WBC: 9.4 10*3/uL (ref 4.0–10.5)
nRBC: 0 % (ref 0.0–0.2)

## 2022-05-04 NOTE — Progress Notes (Signed)
[facility] [place]  Subjective  -no complaints.   Objective (!) 152/79 (!) 106 98.2 F (36.8 C) (Oral) 14 100%  Intake/Output Summary (Last 24 hours) at 05/04/2022 0813 Last data filed at 05/04/2022 0700 Gross per 24 hour  Intake 240 ml  Output 760 ml  Net -520 ml   Midline incision on the abdomen healing Left groin incision healing Right groin with VAC Bilateral AKA's healing with staples  Laboratory Lab Results: Recent Labs    05/03/22 0132 05/04/22 0149  WBC 9.4 9.4  HGB 7.8* 7.5*  HCT 24.5* 23.4*  PLT 370 361   BMET Recent Labs    05/03/22 0132 05/04/22 0149  NA 137 134*  K 4.5 4.0  CL 103 102  CO2 24 23  GLUCOSE 107* 104*  BUN 15 14  CREATININE 1.41* 1.55*  CALCIUM 8.5* 8.1*    COAG Lab Results  Component Value Date   INR 1.2 04/11/2022   INR 1.5 (H) 04/07/2022   INR 0.9 03/09/2022   No results found for: "PTT"  Assessment/Planning:  Status post aortobifemoral bypass with bilateral lower extremity fem-tib bypasses.  Ultimately his right calf muscle became necrotic and he required right AKA.  His left leg bypass failed after multiple revisions.  Required left AKA Friday.    Bilateral AKA's appear to be healing with staples in place.  He has palpable femoral pulses in both groins from the aortobifemoral bypass.  Right groin VAC was changed yesterday by wound care and is healing.  PT is recommending CIR.  I reviewed their notes and they are going to reevaluate today to see how he participates with therapy.  I encouraged him to try and do as much as possible as I think CR would be great.  Hgb stable 7.5.  WBC normal 9.4.  Afebrile.  Cephus Shelling 05/04/2022 8:13 AM --

## 2022-05-04 NOTE — Progress Notes (Signed)
PROGRESS NOTE/consult    Jeffrey Fitzgerald  [id number] DOB: 1971/03/14 DOA: 04/01/2022 PCP: Patient, No Pcp Per    No chief complaint on file.   Brief Narrative:   Jeffrey Fitzgerald is a 51 y.o. male with past medical history of hypertension, peripheral vascular disease, and tobacco abuse who presented initially for critical left lower limb ischemia 8/3 requiring aortobifemoral bypass.  He then underwent left common femoral and posterior tibial bypass on 8/9 same-day for right common revision of the right lower extremity aortobifemoral bypass.  He returned to the OR on 8/11 and underwent thrombectomy of left common femoral to posterior tibial artery bypass, right lower extremity fasciotomy closure, and wound VAC placement.  White blood cell count had trended up to 24.8 and the patient has been temporarily treated empirically with Zosyn.  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  His hospital stay had been complicated by hemorrhagic bleed with hemoglobin trending down to 6 g/dL requiring 2 units of packed red blood cells and creatinine trended up to 3.59 before trending down.    He has been complaining of pain out of left leg which was noted to be cold and contracted.  Vascular had discussed need of left above-knee amputation, but he was not willing to undergo amputation of the left leg.  Palliative care had been consulted.  Patient was noted to become more tachycardic and hypertensive with blood pressures elevated into the 200s at times.  He denies any complaints of any chest pain, nausea, vomiting, or diarrhea.  Patient's white blood cell counts have been otherwise trending down.  TRH consulted to help with medical management.   Assessment & Plan:   Principal Problem:   Critical limb ischemia of  both lower extremities (HCC) Active Problems:   PAD (peripheral artery disease) (HCC)   Acute renal failure (HCC)   Other specified anemias   Pressure injury of skin   SIRS (systemic inflammatory response syndrome) (HCC)   Hypertensive urgency   Hypokalemia   Gastroesophageal reflux disease   Acute postoperative anemia due to expected blood loss  #1 critical limb ischemia -Patient presented with critical limb ischemia bilateral lower extremities required several attempts at revascularization. -Patient subsequently underwent right AKA on 04/22/2022. -Vascular surgery/primary team recommended left AKA however patient initially declined. -Palliative care consulted and following  and spoke with patient and family and patient decided to have left AKA which was done on 04/30/2022 per vascular surgery. -Continue aspirin, statin. -Pain management per primary team. -Per vascular surgery.  2.  SIRS -Patient noted to be tachycardic, tachypneic with a leukocytosis white count of 18.5 with noted to be trending down. -Prior blood cultures done with no growth to date. -Patient noted to have been on Zosyn which has subsequently been discontinued. -UA noted moderate hemoglobin, small leukocytes, rare bacteria, 6-10 WBCs.  Patient with no urinary symptoms. -Right lower extremity wound appears to be healing well. -Patient with no respiratory symptoms. -Leukocytosis trended down and have normalized. -Blood cultures with no growth to date x5 days. -Follow.  3.  Hypertensive urgency -Patient noted to have blood pressure elevated 198/86 with heart rates in the 120s. -Likely a component of pain contributing to hypertensive urgency. -Patient noted to not be on any antihypertensive medications prior to admission. -Patient noted to have been on hydralazine 10 mg  twice daily however did not obtain adequate blood pressure control. -Continue Coreg 6.25 mg twice daily, Norvasc 10 mg daily, hydralazine 25 mg twice  daily. -Uptitrate antihypertensive medications for better blood pressure control if needed.  4.  Hypokalemia -Repleted.   -Potassium of 4.  5.  Acute renal failure -Baseline creatinine approximately 1-1.2. -Renal function noted to trend up to 3.59 on 04/11/2022 and felt likely secondary to contrast given. -Renal function improving with creatinine trending down with hydration currently at 1.55. -Urine output of 700 cc recorded over the past 24 hours. -Follow.  6.  Urinary frequency -Patient noted to have urinary frequency without dysuria. -Urine cultures ordered but have seem to be canceled.  7.  Tobacco abuse -Tobacco cessation. -Nicotine patch.  8.  GERD -Continue PPI.  9.  Postop acute blood loss anemia -Hemoglobin at 7.5 today.   -Follow H&H.   -Transfusion threshold hemoglobin < 7.   DVT prophylaxis: Per primary team Code Status: Full Family Communication: Updated patient.  No family at bedside. Disposition: Per primary team.  Status is: Inpatient Remains inpatient appropriate because: Severity of illness   Consultants:  Cardiology: Dr. Antoine Poche 04/01/2022 Wound care RN, Helmut Muster 04/09/2022 Palliative care: Albesa Seen, NP 04/21/2022 [facility]: Dr. Katrinka Blazing 04/27/2022  Procedures:  Abdominal films 04/07/2022, 04/12/2022 Chest x-ray 04/07/2022, 04/18/2022 Myoview stress test 04/02/2022 2D echo 04/02/2022 Right AKA per vascular surgery: Dr. Myra Gianotti 04/22/2022 Reexploration and washout of right groin wound less than 30 days postop, sharp excisional debridement of right groin wound, repair of aortobifemoral to profunda femoris artery bypass with pledgeted suture, right groin sartorius muscle flap coverage, application negative pressure dressing per vascular surgery: Dr. Randie Heinz 04/20/2022 Right leg fasciotomy washout, soft tissue debridement, right groin washout, soft tissue debridement, VAC placement per vascular surgery: Dr. Sherral Hammers 04/20/2022 Thrombectomy of left common  femoral to posterior tibial artery bypass, closure of right medial calf fasciotomy, application of negative pressure wound VAC greater right lateral calf fasciotomy per vascular surgery: Dr. Chestine Spore 04/09/2022 Right greater saphenous vein harvest, right common femoral to posterior tibial artery bypass with 6 mm PTFE/greater saphenous vein composite graft, right lower extremity angiogram, right lower extremity 4 compartment fasciotomy, right posterior tibial artery thrombectomy per Dr. Lenell Antu, Dr. Chestine Spore vascular surgery 04/08/2022 Redo exploration of the right groin/right common femoral artery thrombectomy/extensive endarterectomy of right profunda with bovine pericardial patch angioplasty, revision of right limb of the aortobifemoral bypass graft 04/07/2022 per Dr. Randie Heinz. Aortobifemoral bypass, right common femoral endarterectomy with profundoplasty, left common femoral endarterectomy with profundoplasty, harvest of left leg great saphenous vein, left common femoral to posterior tibial bypass with composite PTFE and great saphenous vein per vascular surgery: Dr. Chestine Spore 04/07/2022 Aortobifemoral bypass, bilateral common femoral and profunda femoris endarterectomies, harvest left greater saphenous vein, left common femoral to posterior tibial artery bypass with composite 6 mm E PTFE in reverse greater saphenous vein and anatomic tunnel by Dr. Chestine Spore at [facility] 04/07/2022 Attempted ultrasound-guided access left common femoral artery, ultrasound-guided access right common femoral artery, aortogram with catheter selection of the aorta, bilateral lower extremity arteriogram with runoff, 32 minutes monitored moderate conscious sedation time Per vascular surgery: Dr. Chestine Spore 04/01/2022 Left AKA per vascular surgery: Dr. Randie Heinz 04/30/2022  Antimicrobials:  Anti-infectives (From admission, onward)    Start     Dose/Rate Route Frequency Ordered Stop   04/21/22 1315  vancomycin (VANCOCIN) IVPB 1000 mg/200 mL premix   Status:  Discontinued        1,000 mg 200 mL/hr over  60 Minutes Intravenous Every 24 hours 04/21/22 0933 04/27/22 1020   04/19/22 1315  vancomycin (VANCOREADY) IVPB 750 mg/150 mL  Status:  Discontinued        750 mg 150 mL/hr over 60 Minutes Intravenous Every 24 hours 04/19/22 1218 04/21/22 0933   04/18/22 1000  piperacillin-tazobactam (ZOSYN) IVPB 3.375 g  Status:  Discontinued        3.375 g 12.5 mL/hr over 240 Minutes Intravenous Every 8 hours 04/18/22 0925 04/27/22 1020   04/08/22 2200  linezolid (ZYVOX) IVPB 600 mg        600 mg 300 mL/hr over 60 Minutes Intravenous Every 12 hours 04/08/22 1622 04/15/22 2359   04/08/22 0000  ceFAZolin (ANCEF) IVPB 2g/100 mL premix        2 g 200 mL/hr over 30 Minutes Intravenous Every 8 hours 04/07/22 2316 04/08/22 1559   04/07/22 1529  ceFAZolin (ANCEF) IVPB 2g/100 mL premix  Status:  Discontinued        2 g 200 mL/hr over 30 Minutes Intravenous 30 min pre-op 04/07/22 1530 04/07/22 2005   04/06/22 0932  vancomycin (VANCOREADY) IVPB 750 mg/150 mL  Status:  Discontinued        750 mg 150 mL/hr over 60 Minutes Intravenous Every 12 hours 04/06/22 0903 04/08/22 1634   04/05/22 2200  vancomycin (VANCOREADY) IVPB 1250 mg/250 mL  Status:  Discontinued        1,250 mg 166.7 mL/hr over 90 Minutes Intravenous Every 48 hours 04/04/22 1728 04/05/22 1023   04/05/22 1800  vancomycin (VANCOREADY) IVPB 1250 mg/250 mL  Status:  Discontinued        1,250 mg 166.7 mL/hr over 90 Minutes Intravenous Every 24 hours 04/05/22 1023 04/06/22 0903   04/02/22 2200  vancomycin (VANCOREADY) IVPB 750 mg/150 mL  Status:  Discontinued        750 mg 150 mL/hr over 60 Minutes Intravenous Every 12 hours 04/02/22 0853 04/04/22 1728   04/02/22 1000  piperacillin-tazobactam (ZOSYN) IVPB 3.375 g        3.375 g 12.5 mL/hr over 240 Minutes Intravenous Every 8 hours 04/02/22 0828 04/15/22 2359   04/02/22 1000  vancomycin (VANCOREADY) IVPB 1500 mg/300 mL        1,500 mg 150 mL/hr over  120 Minutes Intravenous  Once 04/02/22 0828 04/02/22 1141         Subjective: Laying in bed.  No chest pain.  No shortness of breath.  No abdominal pain.    Objective: Vitals:   05/04/22 0325 05/04/22 0859 05/04/22 1011 05/04/22 1125  BP: (!) 152/79 (!) 147/83  133/76  Pulse: (!) 106 (!) 110  100  Resp: 14 17 12 18   Temp: 98.2 F (36.8 C) 98.3 F (36.8 C)  98.1 F (36.7 C)  TempSrc: Oral Oral  Oral  SpO2: 100% 100%  98%  Weight:      Height:        Intake/Output Summary (Last 24 hours) at 05/04/2022 1233 Last data filed at 05/04/2022 0859 Gross per 24 hour  Intake 360 ml  Output 560 ml  Net -200 ml    Filed Weights   04/09/22 0830 04/22/22 0831 04/30/22 0825  Weight: 68.8 kg 65.8 kg 65.8 kg    Examination:  General exam: NAD Respiratory system: Lungs clear to auscultation bilaterally.  No wheezes, no crackles, no rhonchi.  Fair air movement.  Speaking in full sentences.  Cardiovascular system: Regular rate rhythm no murmurs rubs or gallops.  No JVD.  Gastrointestinal  system: Abdomen is soft, nontender, nondistended, positive bowel sounds.  No rebound.  No guarding.  Central nervous system: Alert and oriented. No focal neurological deficits. Extremities: Status post right AKA with incision site c/d/i.  Status post left AKA with incision site c/d/I, with staples intact.  Skin: No rashes, lesions or ulcers Psychiatry: Judgement and insight normal.  Mood and affect appropriate.    Data Reviewed: I have personally reviewed following labs and imaging studies  CBC: Recent Labs  Lab 04/29/22 0237 04/30/22 1223 05/01/22 0202 05/01/22 1611 05/02/22 0147 05/03/22 0132 05/04/22 0149  WBC 10.2 15.6* 10.6*  --  9.8 9.4 9.4  NEUTROABS 7.7 13.8* 7.5  --   --   --   --   HGB 8.5* 9.1* 7.8* 8.0* 7.7* 7.8* 7.5*  HCT 26.1* 27.0* 24.2* 25.2* 24.6* 24.5* 23.4*  MCV 89.1 88.2 90.0  --  90.8 89.7 89.0  PLT 407* 430* 420*  --  390 370 361     Basic Metabolic Panel: Recent  Labs  Lab 04/28/22 0604 04/29/22 0237 04/30/22 1223 05/01/22 0202 05/02/22 0147 05/03/22 0132 05/04/22 0149  NA 136   < > 137 136 138 137 134*  K 4.1   < > 4.6 4.5 4.4 4.5 4.0  CL 106   < > 101 105 109 103 102  CO2 21*   < > 21* 21* 22 24 23   GLUCOSE 98   < > 120* 99 108* 107* 104*  BUN 11   < > 12 17 16 15 14   CREATININE 1.68*   < > 1.72* 1.72* 1.50* 1.41* 1.55*  CALCIUM 8.2*   < > 8.8* 8.1* 8.2* 8.5* 8.1*  MG 2.0  --  2.0  --   --   --   --    < > = values in this interval not displayed.     GFR: Estimated Creatinine Clearance: 49.6 mL/min (A) (by C-G formula based on SCr of 1.55 mg/dL (H)).  Liver Function Tests: No results for input(s): "AST", "ALT", "ALKPHOS", "BILITOT", "PROT", "ALBUMIN" in the last 168 hours.  CBG: Recent Labs  Lab 04/29/22 1831 04/29/22 2129 04/30/22 0632 04/30/22 1216 04/30/22 1558  GLUCAP 98 111* 113* 119* 157*      No results found for this or any previous visit (from the past 240 hour(s)).       Radiology Studies: No results found.      Scheduled Meds:  amLODipine  10 mg Oral Daily   aspirin EC  81 mg Oral Daily   atorvastatin  80 mg Oral Daily   carvedilol  6.25 mg Oral BID WC   docusate sodium  100 mg Oral Daily   hydrALAZINE  25 mg Oral BID   nicotine  21 mg Transdermal Daily   pantoprazole  40 mg Oral Daily   sodium chloride flush  3 mL Intravenous Q12H   Continuous Infusions:  sodium chloride 180 mL/hr at 04/20/22 0944   sodium chloride 10 mL/hr at 04/25/22 1616     LOS: 33 days    Time spent: 35 minutes    04/22/22, MD [facility]   To contact the attending provider between 7A-7P or the covering provider during after hours 7P-7A, please log into the web site [email or web address] and access using universal [place] password for that web site. If you do not have the password, please call the hospital operator.  05/04/2022, 12:33 PM

## 2022-05-04 NOTE — Progress Notes (Signed)
Inpatient Rehab Admissions Coordinator:   Pt. Continues to demonstrate inability to tolerate intensity of CIR. CIR will sign off.   Megan Salon, MS, CCC-SLP Rehab Admissions Coordinator  [phone] (celll) [phone] (office)

## 2022-05-05 DIAGNOSIS — N171 Acute kidney failure with acute cortical necrosis: Secondary | ICD-10-CM

## 2022-05-05 DIAGNOSIS — I70222 Atherosclerosis of native arteries of extremities with rest pain, left leg: Secondary | ICD-10-CM

## 2022-05-05 NOTE — Consult Note (Addendum)
WOC Nurse wound follow up Patient receiving care in [facility] 4E12 Jeffrey Fitzgerald. Collins, PA-C present for dressing removal.  Wound type: Surgical Measurement: 10 cm x 4 cm x 1 cm Wound bed: beefy red base (see photos taken today by Jeffrey Gallant, PA-C Drainage (amount, consistency, odor) serosanguinous Periwound: intact Dressing procedure/placement/frequency: One piece of black foam removed. Wound cleaned with NS. One piece of black foam replaced with the wound outlined with two barrier rings. Drape applied, immediate suction obtained at 75 mmHg.   WOC will follow MWF.  Jeffrey Reel Katrinka Blazing, MSN, Jeffrey Fitzgerald, Jeffrey Fitzgerald, Jeffrey Fitzgerald, [facility] Wound Treatment Associate Pager [phone]

## 2022-05-05 NOTE — Progress Notes (Signed)
Physical Therapy Treatment Patient Details Name: Jeffrey Fitzgerald MRN: [id number] DOB: September 04, 1970 Today's Date: 05/05/2022   History of Present Illness Pt is a 51 yo male admitted 04/01/22 from cath lab after aortogram for LLE ischemia. S/p aortobifemoral bypass graft with bilateral femoral endarterectomy on 8/9. Post op RLE ischemia with return to OR same date for redo R groin exploration with thrombectomy and angioplasty. S/p RLE fem-pop bypass graft with 4 compartment fasciotomy on 8/10. S/p thrombectomy left femoral to popliteal BPG wtih closure and right fasciotomy wtih VAC on right LE on 8/11. Ileus with emesis on 8/14. 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    PT Comments    Pt received supine and agreeable to session with continued focus on transfers for increased functional mobility and independence. Pt able to initiate lateral scoot transfer with sliding board with max cues and increased time to problem solve, with good weight shift to place board, however pt with max c/o of bottom pain when starting to slide and pt unable to generate power through UE to lift and unweight hips to scoot. Pt requiring up to assist for posterior scoot from bed>chair. Pt continues to benefit from skilled PT services to progress toward functional mobility goals.    Recommendations for follow up therapy are one component of a multi-disciplinary discharge planning process, led by the attending physician.  Recommendations may be updated based on patient status, additional functional criteria and insurance authorization.  Follow Up Recommendations  Acute inpatient rehab (3hours/day) Can patient physically be transported by private vehicle: No   Assistance Recommended at Discharge Frequent or constant Supervision/Assistance  Patient can return home with the following A lot of help with walking and/or transfers;A lot of help with bathing/dressing/bathroom;Assistance with  cooking/housework;Direct supervision/assist for medications management;Direct supervision/assist for financial management;Assist for transportation;Help with stairs or ramp for entrance   Equipment Recommendations  Wheelchair (measurements PT);Wheelchair cushion (measurements PT);Other (comment)    Recommendations for Other Services       Precautions / Restrictions Precautions Precautions: Fall Precaution Comments: watch HR Restrictions Weight Bearing Restrictions: Yes RLE Weight Bearing: Non weight bearing LLE Weight Bearing: Non weight bearing     Mobility  Bed Mobility Overal bed mobility: Needs Assistance Bed Mobility: Sit to Supine       Sit to supine: Mod assist   General bed mobility comments: mod HHA to come to long sitting in bed    Transfers Overall transfer level: Needs assistance Equipment used: None Transfers: Bed to chair/wheelchair/BSC Sit to Stand: Total assist       Anterior-Posterior transfers: Max assist, Total assist  Lateral/Scoot Transfers: Total assist, With slide board General transfer comment: initatied sliding board trasnfer with pt able to weight shift to place board, pt unable to push through UE to raise bottom to slide and pt stating bottom too sore to scoot along board, performed antpost trasnfers chair>bed; total asssit to scoot posterior to chair    Ambulation/Gait               General Gait Details: unable   Stairs             Wheelchair Mobility    Modified Rankin (Stroke Patients Only)       Balance Overall balance assessment: Needs assistance Sitting-balance support: No upper extremity supported Sitting balance-Leahy Scale: Poor Sitting balance - Comments: LOB to the side with initial long sitting in the bed  Cognition Arousal/Alertness: Awake/alert Behavior During Therapy: Flat affect Overall Cognitive Status: Impaired/Different from baseline Area of  Impairment: Problem solving, Awareness                   Current Attention Level: Sustained   Following Commands: Follows one step commands with increased time, Follows multi-step commands with increased time Safety/Judgement: Decreased awareness of safety, Decreased awareness of deficits   Problem Solving: Decreased initiation, Requires verbal cues, Requires tactile cues General Comments: Pt needs increased time and mod to max instructional/demonstrational cueing to initiate tasks.        Exercises Other Exercises Other Exercises: pt declinging further LE therex    General Comments        Pertinent Vitals/Pain Pain Assessment Pain Assessment: Faces Faces Pain Scale: Hurts even more Pain Location: bottom Pain Descriptors / Indicators: Grimacing, Sore Pain Intervention(s): Limited activity within patient's tolerance, Monitored during session, Repositioned    Home Living                          Prior Function            PT Goals (current goals can now be found in the care plan section) Acute Rehab PT Goals PT Goal Formulation: With patient Time For Goal Achievement: 05/07/22    Frequency    Min 3X/week      PT Plan Current plan remains appropriate    Co-evaluation              AM-PAC PT "6 Clicks" Mobility   Outcome Measure  Help needed turning from your back to your side while in a flat bed without using bedrails?: None Help needed moving from lying on your back to sitting on the side of a flat bed without using bedrails?: A Lot Help needed moving to and from a bed to a chair (including a wheelchair)?: Total Help needed standing up from a chair using your arms (e.g., wheelchair or bedside chair)?: Total Help needed to walk in hospital room?: Total Help needed climbing 3-5 steps with a railing? : Total 6 Click Score: 10    End of Session Equipment Utilized During Treatment: Other (comment) (sliding board) Activity Tolerance:  Patient limited by fatigue Patient left: with call bell/phone within reach;in chair Nurse Communication: Mobility status PT Visit Diagnosis: Other abnormalities of gait and mobility (R26.89);Difficulty in walking, not elsewhere classified (R26.2);Muscle weakness (generalized) (M62.81)     Time: 9833-8250 PT Time Calculation (min) (ACUTE ONLY): 21 min  Charges:  $Therapeutic Activity: 8-22 mins                     Mateus Rewerts R. PTA Acute Rehabilitation Services Office: [phone]    Catalina Antigua 05/05/2022, 11:01 AM

## 2022-05-05 NOTE — Progress Notes (Addendum)
[facility] [place]  Subjective  - Doing OK without new complaints   Objective (!) 144/74 100 97.6 F (36.4 C) (Oral) 12 100%  Intake/Output Summary (Last 24 hours) at 05/05/2022 0746 Last data filed at 05/05/2022 0657 Gross per 24 hour  Intake 730 ml  Output 925 ml  Net -195 ml   Right groin with beefy red base   B AKA healing well with retained staple Lungs non labored breathing Right groin wound vac replaced by WOC RN   Assessment/Planning: Status post aortobifemoral bypass with bilateral lower extremity fem-tib bypasses.  Ultimately his right calf muscle became necrotic and he required right AKA.  His left leg bypass failed after multiple revisions.  Required left AKA Friday.    Right groin healing nicely with beefy red base.  Vac replaced. Will arrange [facility] RN wound vac, PT/OT Blood loss anemia requiring multiple transfusions.  HGB 7.5 this am.  We cont to observe.  No indication for transfusion above 7.0 Cr improving with time now 1.5, urine OP 850 last 24 hours HH orders and discharge planning per [facility] PA-C   Mosetta Pigeon 05/05/2022 7:46 AM --  Laboratory Lab Results: Recent Labs    05/03/22 0132 05/04/22 0149  WBC 9.4 9.4  HGB 7.8* 7.5*  HCT 24.5* 23.4*  PLT 370 361   BMET Recent Labs    05/03/22 0132 05/04/22 0149  NA 137 134*  K 4.5 4.0  CL 103 102  CO2 24 23  GLUCOSE 107* 104*  BUN 15 14  CREATININE 1.41* 1.55*  CALCIUM 8.5* 8.1*    COAG Lab Results  Component Value Date   INR 1.2 04/11/2022   INR 1.5 (H) 04/07/2022   INR 0.9 03/09/2022   No results found for: "PTT"  I have seen and evaluated the patient. I agree with the PA note as documented above.  Appears he is not a good candidate for CIR.  Patient states he wants to go home.  Right groin VAC changed today and looks great.  Bilateral AKA's are healing with staples in place.  We will see if we can arrange home health with a home VAC.  Midline  and left groin incision are healing great.  Palpable femoral pulses in both groins after aortobifemoral bypass.  Overall looks good.  Cephus Shelling, MD [facility] [place] Office: [phone]

## 2022-05-05 NOTE — Progress Notes (Signed)
Progress Note   Patient: Jeffrey Fitzgerald WEX:[id number] DOB: September 21, 1970 DOA: 04/01/2022     34 DOS: the patient was seen and examined on 05/05/2022   Brief hospital course: 51 y.o. male with past medical history of hypertension, peripheral vascular disease, and tobacco abuse who presented initially for critical left lower limb ischemia 8/3 requiring aortobifemoral bypass.  He then underwent left common femoral and posterior tibial bypass on 8/9 same-day for right common revision of the right lower extremity aortobifemoral bypass.  He returned to the OR on 8/11 and underwent thrombectomy of left common femoral to posterior tibial artery bypass, right lower extremity fasciotomy closure, and wound VAC placement.  White blood cell count had trended up to 24.8 and the patient has been temporarily treated empirically with Zosyn.  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  His hospital stay had been complicated by hemorrhagic bleed with hemoglobin trending down to 6 g/dL requiring 2 units of packed red blood cells and creatinine trended up to 3.59 before trending down.    He has been complaining of pain out of left leg which was noted to be cold and contracted.  Vascular had discussed need of left above-knee amputation, but he was not willing to undergo amputation of the left leg.  Palliative care had been consulted.  Patient was noted to become more tachycardic and hypertensive with blood pressures elevated into the 200s at times.  He denies any complaints of any chest pain, nausea, vomiting, or diarrhea.  Patient's white blood cell counts have been otherwise trending down.  TRH consulted to help with medical management.    Assessment and Plan: #1 critical limb ischemia -Patient presented with critical limb  ischemia bilateral lower extremities required several attempts at revascularization. -Patient subsequently underwent right AKA on 04/22/2022. -Vascular surgery/primary team recommended left AKA however patient initially declined. -Palliative care consulted and following  and spoke with patient and family and patient decided to have left AKA which was done on 04/30/2022 per vascular surgery. -Continue aspirin, statin. -Pain management per primary team, R groin VAC changed   2.  SIRS -Patient noted to be tachycardic, tachypneic with a leukocytosis white count of 18.5 with noted to be trending down. -Prior blood cultures done with no growth to date. -Patient noted to have been on Zosyn which has subsequently been discontinued. -UA noted moderate hemoglobin, small leukocytes, rare bacteria, 6-10 WBCs.  Patient with no urinary symptoms. -Right lower extremity wound appears to be healing well. -Patient with no respiratory symptoms. -Leukocytosis trended down and have normalized. -Blood cultures with no growth to date   3.  Hypertensive urgency -Patient noted to have blood pressure elevated 198/86 with heart rates in the 120s. -Likely a component of pain contributing to hypertensive urgency. -Patient noted to not be on any antihypertensive medications prior to admission. -Patient noted to have been on hydralazine 10 mg twice daily however did not obtain adequate blood pressure control. -Continue Coreg 6.25 mg twice daily, Norvasc 10 mg daily, hydralazine 25 mg twice daily. -BP now improved   4.  Hypokalemia -Repleted.   -Potassium of 4.   5.  Acute renal failure -Baseline creatinine approximately 1-1.2. -Renal function noted to trend up to 3.59 on 04/11/2022 and felt likely secondary to contrast given. -Renal function improving with creatinine trending down with  hydration currently at 1.55. -Cr 1.55 this AM -Follow.   6.  Urinary frequency -Patient noted to have urinary frequency without  dysuria.   7.  Tobacco abuse -Tobacco cessation. -Nicotine patch.   8.  GERD -Continue PPI.   9.  Postop acute blood loss anemia -Follow H&H.   -Transfusion threshold hemoglobin < 7.       Subjective: Without complaints this AM  Physical Exam: Vitals:   05/05/22 0005 05/05/22 0350 05/05/22 0731 05/05/22 0831  BP: (!) 143/72 (!) 144/74  126/79  Pulse: 100 100  98  Resp: 15 12  19   Temp: 97.9 F (36.6 C) 98.1 F (36.7 C) 97.6 F (36.4 C)   TempSrc: Oral Oral Oral   SpO2: 99% 100%  98%  Weight:      Height:       General exam: Awake, laying in bed, in nad Respiratory system: Normal respiratory effort, no wheezing Cardiovascular system: regular rate, s1, s2 Gastrointestinal system: Soft, nondistended, positive BS Central nervous system: CN2-12 grossly intact, strength intact Extremities: Perfused, s/p BLE amputations Skin: Normal skin turgor, no notable skin lesions seen Psychiatry: Mood normal // no visual hallucinations   Data Reviewed:  Labs reviewed: Na 134, K 4.0, Cr 1.55  Family Communication: Pt in room, family not at bedside  Disposition: Status is: Inpatient Remains inpatient appropriate because: Severity of illness  Planned Discharge Destination: Home    Author: , MD 05/05/2022 3:56 PM  For on call review [email or web address].

## 2022-05-05 NOTE — Hospital Course (Signed)
51 y.o. male with past medical history of hypertension, peripheral vascular disease, and tobacco abuse who presented initially for critical left lower limb ischemia 8/3 requiring aortobifemoral bypass.  He then underwent left common femoral and posterior tibial bypass on 8/9 same-day for right common revision of the right lower extremity aortobifemoral bypass.  He returned to the OR on 8/11 and underwent thrombectomy of left common femoral to posterior tibial artery bypass, right lower extremity fasciotomy closure, and wound VAC placement.  White blood cell count had trended up to 24.8 and the patient has been temporarily treated empirically with Zosyn.  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  His hospital stay had been complicated by hemorrhagic bleed with hemoglobin trending down to 6 g/dL requiring 2 units of packed red blood cells and creatinine trended up to 3.59 before trending down.    He has been complaining of pain out of left leg which was noted to be cold and contracted.  Vascular had discussed need of left above-knee amputation, but he was not willing to undergo amputation of the left leg.  Palliative care had been consulted.  Patient was noted to become more tachycardic and hypertensive with blood pressures elevated into the 200s at times.  He denies any complaints of any chest pain, nausea, vomiting, or diarrhea.  Patient's white blood cell counts have been otherwise trending down.  TRH consulted to help with medical management.

## 2022-05-05 NOTE — TOC Progression Note (Signed)
Transition of Care [facility]) - Progression Note    Patient Details  Name: Jeffrey Fitzgerald MRN: [id number] Date of Birth: [birth year]  Transition of Care [facility]) CM/SW [place], [place] Phone Number: 05/05/2022, 5:17 PM  Clinical Narrative:     CSW met with patient at bedside. CSW introduced self and explained role. Patient confirmed, he declined SNF and wants to discharge home. CSW encourage patient to continue to work with therapies.   TOC will continue to follow and assist with discharge planning.    Expected Discharge Plan: [place] Barriers to Discharge: Continued Medical Work up  Expected Discharge Plan and Services Expected Discharge Plan: [place] In-house Referral: Clinical Social Work Discharge Planning Services: CM Consult   Living arrangements for the past 2 months: [place]                 DME Arranged: Hospital bed, Other see comment, Wheelchair manual DME Agency: [employer]                   Social Determinants of Health (SDOH) Interventions    Readmission Risk Interventions     No data to display

## 2022-05-05 NOTE — TOC Progression Note (Signed)
Transition of Care (TOC) - Progression Note  Donn Pierini RN, BSN Transitions of Care Unit 4E- RN Case Manager See Treatment Team for direct phone #    Patient Details  Name: Jeffrey Fitzgerald MRN: [id number] Date of Birth: [birth year]  Transition of Care [facility]) CM/SW Contact  Zenda Alpers, Lenn Sink, RN Phone Number: 05/05/2022, 4:24 PM  Clinical Narrative:    Noted CIR has signed off - no longer following for possible admission- do not feel pt can participate at level required for INPT rehab.   CM spoke with pt who was OOB to chair. Discussed transition plans and per pt he wants to go home. Pt voiced he does not want to go to SNF. Discussed what going home looks like. Pt lives with his sister -[name]- who works during the day and pt would be at home alone during that time. Pt is not independent at this time with transfers and to return home safely would need to be independent with OOB transfers from bed to chair/wheelchair.   Pt also has wound VAC- is un-insured- HH will be a barrier for home wound VAC drsg needs as well as therapy needs. Pt might would qualify for therapy under charity but nursing will be difficult to secure. Have spoken with [facility] who is covering [facility] referrals this week and they are unable to accept for wound care needs. CM will continue to follow as pt progresses.   Have discussed barriers to transition with PT as well as with TOC leadership. Requested pt to be looked at for [facility] program with therapy here. Currently there are no openings in the [facility] program- however leadership has agreed to add pt to list for when an opening comes up. Pt will need to be optimized with independent transfers as much as possible to safely transition home.   Also discussed with pt DME needs, pt has electric scooter but will need w/c and slide board - DME orders have been placed- CM will follow for DME referral closer to discharge readiness.   Patient provided permission to call and speak  with sister [name] regarding transition needs- TC made and VM left requesting return call.     Expected Discharge Plan: Home w Home Health Services Barriers to Discharge: Continued Medical Work up  Expected Discharge Plan and Services Expected Discharge Plan: Home w Home Health Services In-house Referral: Clinical Social Work Discharge Planning Services: CM Consult   Living arrangements for the past 2 months: Single Family Home                 DME Arranged: Hospital bed, Other see comment, Wheelchair manual DME Agency: [employer]                   Social Determinants of Health (SDOH) Interventions    Readmission Risk Interventions     No data to display

## 2022-05-06 LAB — COMPREHENSIVE METABOLIC PANEL
ALT: 41 U/L (ref 0–44)
AST: 34 U/L (ref 15–41)
Albumin: 2.2 g/dL — ABNORMAL LOW (ref 3.5–5.0)
Alkaline Phosphatase: 97 U/L (ref 38–126)
Anion gap: 7 (ref 5–15)
BUN: 17 mg/dL (ref 6–20)
CO2: 25 mmol/L (ref 22–32)
Calcium: 8.3 mg/dL — ABNORMAL LOW (ref 8.9–10.3)
Chloride: 105 mmol/L (ref 98–111)
Creatinine, Ser: 1.55 mg/dL — ABNORMAL HIGH (ref 0.61–1.24)
GFR, Estimated: 54 mL/min — ABNORMAL LOW (ref >60)
Glucose, Bld: 107 mg/dL — ABNORMAL HIGH (ref 70–99)
Potassium: 4.1 mmol/L (ref 3.5–5.1)
Sodium: 137 mmol/L (ref 135–145)
Total Bilirubin: 0.5 mg/dL (ref 0.3–1.2)
Total Protein: 6.5 g/dL (ref 6.5–8.1)

## 2022-05-06 LAB — CBC
HCT: 25 % — ABNORMAL LOW (ref 39.0–52.0)
Hemoglobin: 8.2 g/dL — ABNORMAL LOW (ref 13.0–17.0)
MCH: 29.3 pg (ref 26.0–34.0)
MCHC: 32.8 g/dL (ref 30.0–36.0)
MCV: 89.3 fL (ref 80.0–100.0)
Platelets: 338 K/uL (ref 150–400)
RBC: 2.8 MIL/uL — ABNORMAL LOW (ref 4.22–5.81)
RDW: 16.1 % — ABNORMAL HIGH (ref 11.5–15.5)
WBC: 10.5 K/uL (ref 4.0–10.5)
nRBC: 0 % (ref 0.0–0.2)

## 2022-05-06 LAB — GLUCOSE, CAPILLARY
Glucose-Capillary: 119 mg/dL — ABNORMAL HIGH (ref 70–99)
Glucose-Capillary: 113 mg/dL — ABNORMAL HIGH (ref 70–99)
Glucose-Capillary: 132 mg/dL — ABNORMAL HIGH (ref 70–99)

## 2022-05-06 NOTE — Progress Notes (Signed)
Pt observed smoking cigarette in room last evening. States that his father brought 1 cigarette and 1 match in for him. Pt asked to give items to RN but states he does not have anything to give. Charge nurse alerted. Room search conducted and 1 cigarette, cigarette butt and lighter found in patient bed. States that he needed to smoke. Patient educated on dangers of smoking in room. Nurse Manager alerted of incident and pt visitors to see RN prior to entering room.

## 2022-05-06 NOTE — Progress Notes (Addendum)
  Progress Note    05/06/2022 7:51 AM 6 Days Post-Op  Subjective:  no complaints    Vitals:   05/06/22 0325 05/06/22 0750  BP: (!) 157/74   Pulse: (!) 114   Resp: 16   Temp: 98 F (36.7 C) 98.3 F (36.8 C)  SpO2: 100%     Physical Exam: Lungs:  nonlabored Incisions:  bilateral AKAs intact with staples. R groin with wound vac and good seal  CBC    Component Value Date/Time   WBC 10.5 05/06/2022 0208   RBC 2.80 (L) 05/06/2022 0208   HGB 8.2 (L) 05/06/2022 0208   HCT 25.0 (L) 05/06/2022 0208   PLT 338 05/06/2022 0208   MCV 89.3 05/06/2022 0208   MCH 29.3 05/06/2022 0208   MCHC 32.8 05/06/2022 0208   RDW 16.1 (H) 05/06/2022 0208   LYMPHSABS 1.8 05/01/2022 0202   MONOABS 1.2 (H) 05/01/2022 0202   EOSABS 0.0 05/01/2022 0202   BASOSABS 0.0 05/01/2022 0202    BMET    Component Value Date/Time   NA 137 05/06/2022 0208   K 4.1 05/06/2022 0208   CL 105 05/06/2022 0208   CO2 25 05/06/2022 0208   GLUCOSE 107 (H) 05/06/2022 0208   BUN 17 05/06/2022 0208   CREATININE 1.55 (H) 05/06/2022 0208   CALCIUM 8.3 (L) 05/06/2022 0208   GFRNONAA 54 (L) 05/06/2022 0208   GFRAA >60 01/12/2020 1421    INR    Component Value Date/Time   INR 1.2 04/11/2022 0437     Intake/Output Summary (Last 24 hours) at 05/06/2022 0751 Last data filed at 05/05/2022 1902 Gross per 24 hour  Intake --  Output 300 ml  Net -300 ml     Assessment/Plan:  51 y.o. male is 6 days post op, s/p: L AKA  -bilateral AKAs healing well with staples -R groin with wound vac with good seal, wound care team will change on 9/8 -pending wound vac care and therapy at home for patient per TOC. Cannot discharge until these are obtained.  -Hgb stable 8.2 -Cr stable at 1.55   Loel Dubonnet, PA-C [facility] [phone] 05/06/2022 7:51 AM   I have seen and evaluated the patient. I agree with the PA note as documented above.  Status post aortobifemoral bypass with bilateral fem-tib  bypasses.  Now bilateral AKA's.  Both AKA's look great.  The VAC in his right groin is good seal.  This was changed yesterday and is granulating with healthy appearing tissue.  CIR signed off.  Now it becomes an issue of SNF versus going home given we need very diligent wound care with a VAC to the right groin.  Cephus Shelling, MD [facility] [place] Office: [phone]

## 2022-05-06 NOTE — Progress Notes (Addendum)
Physical Therapy Treatment Patient Details Name: Jeffrey Fitzgerald MRN: [id number] DOB: [birth year] Today's Date: 05/06/2022   History of Present Illness Pt is a 51 yo male admitted 04/01/22 from cath lab after aortogram for LLE ischemia. S/p aortobifemoral bypass graft with bilateral femoral endarterectomy on 8/9. Post op RLE ischemia with return to OR same date for redo R groin exploration with thrombectomy and angioplasty. S/p RLE fem-pop bypass graft with 4 compartment fasciotomy on 8/10. S/p thrombectomy left femoral to popliteal BPG wtih closure and right fasciotomy wtih VAC on right LE on 8/11. Ileus with emesis on 8/14. Complicated hospital course. 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 PMH includes HTN, tobacco smoking, PVD.   PT Comments    Pt seen for mobility progression, d/c planning and goals update. Pt requiring min-maxA for bed mobility, max-totalA for scooting transfers to/from electric scooter. Pt reports sister unable to provide necessary assist for transfers OOB, but able to assist with bed-level mobility and bed-level ADL tasks. Pt remains adamant about d/c home - blunt discussion with pt regarding reality of this picture, including primarily being bedbound (pt declines hoyer lift), bed-level bathing, wearing diapers while sister at work, having everything needed brought to him in his bed, including food and medications. Pt reports sister able to provide assist with ADLs; has a cousin who could "lift" him on occasion. Pt seems responsive, honest and receptive in conversation; he appears to have at least some grasp on this reality (still unsure true baseline cognition), and pt remains clear with desire to go home (i.e. pt aware hoyer lift is only way sister likely able to get him OOB, pt reports, "I'd rather just stay in bed all day"). The pt's desire to participate is self-limiting activity progression while admitted. CM updated with current info and DME recs; appreciate their efforts in trying to  secure [facility] services.   Will continue to follow acutely to address established goals. During this admission, I do not expect this patient will achieve a level of independence with mobility that his sister will be able to provide the necessary assist for transfers at home. The patient will rely heavily on caregiver support for all mobility/ADLs.     Recommendations for follow up therapy are one component of a multi-disciplinary discharge planning process, led by the attending physician.  Recommendations may be updated based on patient status, additional functional criteria and insurance authorization.  Follow Up Recommendations  Skilled nursing-short term rehab (<3 hours/day) (pt declines SNF, will go home) Can patient physically be transported by private vehicle: No   Assistance Recommended at Discharge Intermittent Supervision/Assistance  Patient can return home with the following A lot of help with walking and/or transfers;A lot of help with bathing/dressing/bathroom;Assistance with cooking/housework;Direct supervision/assist for medications management;Direct supervision/assist for financial management;Assist for transportation;Help with stairs or ramp for entrance   Equipment Recommendations  Wheelchair (measurements PT);Wheelchair cushion (measurements PT);Hospital bed    Recommendations for Other Services       Precautions / Restrictions Precautions Precautions: Fall;Other (comment) Precaution Comments: R groin wound vac Restrictions Weight Bearing Restrictions: Yes RLE Weight Bearing: Non weight bearing LLE Weight Bearing: Non weight bearing     Mobility  Bed Mobility Overal bed mobility: Needs Assistance Bed Mobility: Supine to Sit, Rolling Rolling: Modified independent (Device/Increase time)   Supine to sit: Mod assist, HOB elevated     General bed mobility comments: pt mod indep to roll towards R-side with bed rail; modA for HHA to elevate trunk and scoot L  hip towards  EOB; return to supine without assist - pt able to pull self up with bed rails when bed flat and positioned so pt could reach top rail    Transfers Overall transfer level: Needs assistance Equipment used: None (bed pad to cradle hips) Transfers: Bed to chair/wheelchair/BSC            Lateral/Scoot Transfers: Max assist, Total assist General transfer comment: pt declined use of slide board ("it hurts (my bottom)"). maxA to scoot hips with bed pad from elevated bed to electric scooter, pt able to minimally assist with BUE support; totalA for lateral scoot from scooter back to bed due to more awkward positioning with less access to rail to pull, pt declined holding therapist neck/torso to allow ease of transfer    Ambulation/Gait                   Stairs             Wheelchair Mobility    Modified Rankin (Stroke Patients Only)       Balance Overall balance assessment: Needs assistance Sitting-balance support: No upper extremity supported Sitting balance-Leahy Scale: Fair Sitting balance - Comments: inconsistent sitting balance EOB, at times able to maintain without UE support but unable to accept challenge                                    Cognition Arousal/Alertness: Awake/alert Behavior During Therapy: Flat affect Overall Cognitive Status: No family/caregiver present to determine baseline cognitive functioning Area of Impairment: Attention, Following commands, Safety/judgement, Awareness, Problem solving                   Current Attention Level: Sustained   Following Commands: Follows one step commands consistently, Follows one step commands with increased time Safety/Judgement: Decreased awareness of safety, Decreased awareness of deficits Awareness: Emergent Problem Solving: Requires verbal cues          Exercises      General Comments General comments (skin integrity, edema, etc.): pt motivated for transfer to electric  scooter, able to maneuver it in hallway without assist. increased time discussing reality of d/c home and what this will look like (pt refusing post-acute rehab, wants to go home) - pt aware sister he lives with unable to provide maxA for transfers OOB, therefore pt will primarily be bedbound (hope to arrange hospital bed) with bed-level bathing from sister, diapers for bathroom while sister at work, sister has to help with pericare when she's home from work; food brought to patient in bed -- pt reports this level of assist (bed-level ADLs, household tasks) sister can help with. pt agreeable to manual w/c and hospital bed, pt declines slide board or hoyer lift (even though hoyer lift is only option for sister to be able to transfer him OOB; pt reports "I'd rather just sit in the bed all day") -- pt clear and concise with this conversation, shows apparent insight into reality of situation, now just a matter of others being able to provide needed assist. pt adamant about desire for home. reports agreeable to HHPT if an option "because it will make things easier on my sister." pt endorses plan to continue cigarette use      Pertinent Vitals/Pain Pain Assessment Pain Assessment: No/denies pain Pain Intervention(s): Monitored during session    Home Living  Prior Function            PT Goals (current goals can now be found in the care plan section) Acute Rehab PT Goals Patient Stated Goal: to go home PT Goal Formulation: With patient Time For Goal Achievement: 05/20/22 Potential to Achieve Goals: Fair Progress towards PT goals: Goals downgraded-see care plan    Frequency           PT Plan Discharge plan needs to be updated    Co-evaluation              AM-PAC PT "6 Clicks" Mobility   Outcome Measure  Help needed turning from your back to your side while in a flat bed without using bedrails?: A Little Help needed moving from lying on your back  to sitting on the side of a flat bed without using bedrails?: A Lot Help needed moving to and from a bed to a chair (including a wheelchair)?: Total Help needed standing up from a chair using your arms (e.g., wheelchair or bedside chair)?: Total Help needed to walk in hospital room?: Total Help needed climbing 3-5 steps with a railing? : Total 6 Click Score: 9    End of Session   Activity Tolerance: Patient tolerated treatment well Patient left: in bed;with call bell/phone within reach;with bed alarm set Nurse Communication: Mobility status PT Visit Diagnosis: Other abnormalities of gait and mobility (R26.89);Difficulty in walking, not elsewhere classified (R26.2);Muscle weakness (generalized) (M62.81)     Time: [id number] PT Time Calculation (min) (ACUTE ONLY): 35 min  Charges:  $Therapeutic Activity: 8-22 mins $Self Care/Home Management: [place], PT, DPT Acute Rehabilitation Services  Personal: [facility] Office: [place] 05/06/2022, 4:36 PM

## 2022-05-06 NOTE — Progress Notes (Signed)
Occupational Therapy Treatment Patient Details Name: Jeffrey Fitzgerald MRN: [id number] DOB: [birth year] Today's Date: 05/06/2022   History of present illness Pt is a 51 yo male admitted 04/01/22 from cath lab after aortogram for LLE ischemia. S/p aortobifemoral bypass graft with bilateral femoral endarterectomy on 8/9. Post op RLE ischemia with return to OR same date for redo R groin exploration with thrombectomy and angioplasty. S/p RLE fem-pop bypass graft with 4 compartment fasciotomy on 8/10. S/p thrombectomy left femoral to popliteal BPG wtih closure and right fasciotomy wtih VAC on right LE on 8/11. Ileus with emesis on 8/14. 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   OT comments  Pt. Seen for skilled OT treatment session.  Focus of session was bed mobility components as precurser for continued mobility for transfers.  Pt. Mod a sidelying to sit and to get toward eob for sitting.  Able to side scoot towards L x2 but could not initiate scoot to R despite encouragement and demo.  Cont. With acute OT goals and progress as pt. Able.     Recommendations for follow up therapy are one component of a multi-disciplinary discharge planning process, led by the attending physician.  Recommendations may be updated based on patient status, additional functional criteria and insurance authorization.    Follow Up Recommendations  Skilled nursing-short term rehab (<3 hours/day)    Assistance Recommended at Discharge Frequent or constant Supervision/Assistance  Patient can return home with the following  Two people to help with walking and/or transfers;A lot of help with bathing/dressing/bathroom;Direct supervision/assist for medications management;Assist for transportation;Help with stairs or ramp for entrance   Equipment Recommendations  Other (comment)    Recommendations for Other Services      Precautions / Restrictions Precautions Precautions: Fall Precaution Comments: watch  HR Restrictions RLE Weight Bearing: Non weight bearing LLE Weight Bearing: Non weight bearing       Mobility Bed Mobility Overal bed mobility: Needs Assistance Bed Mobility: Sidelying to Sit   Sidelying to sit: Mod assist       General bed mobility comments: pt. was laying on L side upon arrival into room head towards foot of the bed. was trying to open juice while laying sideways. max a to come into sitting. r ue support on bed rail utilized pad to assist with pt. scooting towards eob to open and drink his juice.  declined wanting to eat breakfast.  worked on scooting towards hob and foot of bed with use of b ues pushing into bed. pt. able to scoot x2 towards foot of bed. when asked to scoot towards hob he paused and was unabel to initiate the same steps but different direction. demonstration provided along with encouragement.  pt. could not complete with increased time and states "you know what im tired i cant". 2 pillows placed on bed rail directly behind him.  declined hob at top of bed or back in sidelying that he was in and wanted to lay back on the pillows instead    Transfers                         Balance                                           ADL either performed or assessed with clinical judgement   ADL  Extremity/Trunk Assessment              Vision       Perception     Praxis      Cognition Arousal/Alertness: Awake/alert Behavior During Therapy: WFL for tasks assessed/performed Overall Cognitive Status: Impaired/Different from baseline Area of Impairment: Problem solving, Awareness                   Current Attention Level: Sustained Memory: Decreased short-term memory Following Commands: Follows one step commands with increased time, Follows multi-step commands with increased time Safety/Judgement: Decreased awareness of safety, Decreased awareness  of deficits Awareness: Intellectual Problem Solving: Decreased initiation, Requires verbal cues, Requires tactile cues General Comments: Pt needs increased time and mod to max instructional/demonstrational cueing to initiate tasks.        Exercises      Shoulder Instructions       General Comments      Pertinent Vitals/ Pain       Pain Assessment Pain Assessment: No/denies pain  Home Living                                          Prior Functioning/Environment              Frequency  Min 2X/week        Progress Toward Goals  OT Goals(current goals can now be found in the care plan section)  Progress towards OT goals: Progressing toward goals     Plan Frequency remains appropriate;Discharge plan remains appropriate    Co-evaluation                 AM-PAC OT "6 Clicks" Daily Activity     Outcome Measure   Help from another person eating meals?: None Help from another person taking care of personal grooming?: A Little Help from another person toileting, which includes using toliet, bedpan, or urinal?: Total Help from another person bathing (including washing, rinsing, drying)?: Total Help from another person to put on and taking off regular upper body clothing?: A Little Help from another person to put on and taking off regular lower body clothing?: Total 6 Click Score: 13    End of Session    OT Visit Diagnosis: Unsteadiness on feet (R26.81);Other abnormalities of gait and mobility (R26.89);Muscle weakness (generalized) (M62.81);Other symptoms and signs involving cognitive function Pain - Right/Left: Left Pain - part of body: Leg   Activity Tolerance Patient tolerated treatment well   Patient Left in bed;with call bell/phone within reach;with bed alarm set   Nurse Communication  Alerted RN notable delay of holding liquid in his mouth before he swallows it        Time: [id number] OT Time Calculation (min): 17  min  Charges: OT General Charges $OT Visit: 1 Visit OT Treatments $Therapeutic Activity: 8-22 mins  Boneta Lucks, COTA/L Acute Rehabilitation [phone]   Salvadore Oxford 05/06/2022, 12:37 PM

## 2022-05-06 NOTE — Progress Notes (Signed)
Progress Note   Patient: Jeffrey Fitzgerald LFY:[id number] DOB: 20-Oct-1970 DOA: 04/01/2022     35 DOS: the patient was seen and examined on 05/06/2022   Brief hospital course: 51 y.o. male with past medical history of hypertension, peripheral vascular disease, and tobacco abuse who presented initially for critical left lower limb ischemia 8/3 requiring aortobifemoral bypass.  He then underwent left common femoral and posterior tibial bypass on 8/9 same-day for right common revision of the right lower extremity aortobifemoral bypass.  He returned to the OR on 8/11 and underwent thrombectomy of left common femoral to posterior tibial artery bypass, right lower extremity fasciotomy closure, and wound VAC placement.  White blood cell count had trended up to 24.8 and the patient has been temporarily treated empirically with Zosyn.  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  His hospital stay had been complicated by hemorrhagic bleed with hemoglobin trending down to 6 g/dL requiring 2 units of packed red blood cells and creatinine trended up to 3.59 before trending down.    He has been complaining of pain out of left leg which was noted to be cold and contracted.  Vascular had discussed need of left above-knee amputation, but he was not willing to undergo amputation of the left leg.  Palliative care had been consulted.  Patient was noted to become more tachycardic and hypertensive with blood pressures elevated into the 200s at times.  He denies any complaints of any chest pain, nausea, vomiting, or diarrhea.  Patient's white blood cell counts have been otherwise trending down.  TRH consulted to help with medical management.    Assessment and Plan: #1 critical limb ischemia -Patient presented with critical limb  ischemia bilateral lower extremities required several attempts at revascularization. -Patient subsequently underwent right AKA on 04/22/2022. -Vascular surgery/primary team recommended left AKA however patient initially declined. -Palliative care consulted and following  and spoke with patient and family and patient decided to have left AKA which was done on 04/30/2022 per vascular surgery. -Continue aspirin, statin. -Pain management per primary team, R groin VAC changed   2.  SIRS -Patient noted to be tachycardic, tachypneic with a leukocytosis white count of 18.5 with noted to be trending down. -Prior blood cultures done with no growth to date. -Patient noted to have been on Zosyn which has subsequently been discontinued. -UA noted moderate hemoglobin, small leukocytes, rare bacteria, 6-10 WBCs.  Patient with no urinary symptoms. -Right lower extremity wound appears to be healing well. -Without respiratory symptoms. -Leukocytosis trended down and have normalized. -Blood cultures with no growth to date   3.  Hypertensive urgency -Patient noted to have blood pressure elevated 198/86 with heart rates in the 120s. -Likely a component of pain contributing to hypertensive urgency. -Patient noted to not be on any antihypertensive medications prior to admission. -Patient noted to have been on hydralazine 10 mg twice daily however did not obtain adequate blood pressure control. -Continue Coreg 6.25 mg twice daily, Norvasc 10 mg daily, hydralazine 25 mg twice daily. -BP currently stable and controlled   4.  Hypokalemia -Repleted.   -Potassium of 4.   5.  Acute renal failure -Baseline creatinine approximately 1-1.2. -Renal function noted to trend up to 3.59 on 04/11/2022 and felt likely secondary to contrast given. -Renal function improving with creatinine trending down with  hydration currently at 1.55. -Cr 1.55 this AM and stable    6.  Urinary frequency -Patient noted to have urinary  frequency without dysuria.   7.  Tobacco abuse -Tobacco cessation. -Nicotine patch.   8.  GERD -Continue PPI.   9.  Postop acute blood loss anemia -Follow H&H trends, stable -Transfusion threshold hemoglobin < 7.       Subjective: without complaints this AM  Physical Exam: Vitals:   05/06/22 0325 05/06/22 0750 05/06/22 1212 05/06/22 1624  BP: (!) 157/74 125/63 118/73 (!) 115/91  Pulse: (!) 114 (!) 109 (!) 107 (!) 108  Resp: 16 16 18 13   Temp: 98 F (36.7 C) 98.3 F (36.8 C) 97.9 F (36.6 C) 97.9 F (36.6 C)  TempSrc: Oral Oral Oral Axillary  SpO2: 100% 100% 100% 100%  Weight:      Height:       General exam: Conversant, in no acute distress Respiratory system: normal chest rise, clear, no audible wheezing Cardiovascular system: regular rhythm, s1-s2 Gastrointestinal system: Nondistended, nontender, pos BS Central nervous system: No seizures, no tremors Extremities: No cyanosis, s/p BLE amputations Skin: No rashes, no pallor Psychiatry: Affect normal // no auditory hallucinations   Data Reviewed:  Labs reviewed: Na 137, K 4.1, Cr 1.55  Family Communication: Pt in room, family not at bedside  Disposition: Status is: Inpatient Remains inpatient appropriate because: Severity of illness  Planned Discharge Destination:  Uncertain at this time    Author: , MD 05/06/2022 4:49 PM  For on call review [email or web address].

## 2022-05-07 LAB — COMPREHENSIVE METABOLIC PANEL
ALT: 35 U/L (ref 0–44)
AST: 28 U/L (ref 15–41)
Albumin: 2.2 g/dL — ABNORMAL LOW (ref 3.5–5.0)
Alkaline Phosphatase: 96 U/L (ref 38–126)
Anion gap: 10 (ref 5–15)
BUN: 18 mg/dL (ref 6–20)
CO2: 24 mmol/L (ref 22–32)
Calcium: 8.7 mg/dL — ABNORMAL LOW (ref 8.9–10.3)
Chloride: 103 mmol/L (ref 98–111)
Creatinine, Ser: 1.37 mg/dL — ABNORMAL HIGH (ref 0.61–1.24)
GFR, Estimated: >60 mL/min (ref >60)
Glucose, Bld: 107 mg/dL — ABNORMAL HIGH (ref 70–99)
Potassium: 4.2 mmol/L (ref 3.5–5.1)
Sodium: 137 mmol/L (ref 135–145)
Total Bilirubin: 0.4 mg/dL (ref 0.3–1.2)
Total Protein: 6.3 g/dL — ABNORMAL LOW (ref 6.5–8.1)

## 2022-05-07 LAB — CBC
HCT: 25.8 % — ABNORMAL LOW (ref 39.0–52.0)
Hemoglobin: 8.1 g/dL — ABNORMAL LOW (ref 13.0–17.0)
MCH: 28.5 pg (ref 26.0–34.0)
MCHC: 31.4 g/dL (ref 30.0–36.0)
MCV: 90.8 fL (ref 80.0–100.0)
Platelets: 334 10*3/uL (ref 150–400)
RBC: 2.84 MIL/uL — ABNORMAL LOW (ref 4.22–5.81)
RDW: 16.1 % — ABNORMAL HIGH (ref 11.5–15.5)
WBC: 9.9 10*3/uL (ref 4.0–10.5)
nRBC: 0 % (ref 0.0–0.2)

## 2022-05-07 NOTE — Progress Notes (Signed)
Progress Note   Patient: Jeffrey Fitzgerald [id number]:[id number] DOB: 03-15-1971 DOA: 04/01/2022     36 DOS: the patient was seen and examined on 05/07/2022   Brief hospital course: 51 y.o. male with past medical history of hypertension, peripheral vascular disease, and tobacco abuse who presented initially for critical left lower limb ischemia 8/3 requiring aortobifemoral bypass.  He then underwent left common femoral and posterior tibial bypass on 8/9 same-day for right common revision of the right lower extremity aortobifemoral bypass.  He returned to the OR on 8/11 and underwent thrombectomy of left common femoral to posterior tibial artery bypass, right lower extremity fasciotomy closure, and wound VAC placement.  White blood cell count had trended up to 24.8 and the patient has been temporarily treated empirically with Zosyn.  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  His hospital stay had been complicated by hemorrhagic bleed with hemoglobin trending down to 6 g/dL requiring 2 units of packed red blood cells and creatinine trended up to 3.59 before trending down.    He has been complaining of pain out of left leg which was noted to be cold and contracted.  Vascular had discussed need of left above-knee amputation, but he was not willing to undergo amputation of the left leg.  Palliative care had been consulted.  Patient was noted to become more tachycardic and hypertensive with blood pressures elevated into the 200s at times.  He denies any complaints of any chest pain, nausea, vomiting, or diarrhea.  Patient's white blood cell counts have been otherwise trending down.  TRH consulted to help with medical management.    Assessment and Plan: #1 critical limb ischemia -Patient presented with critical limb  ischemia bilateral lower extremities required several attempts at revascularization. -Patient subsequently underwent right AKA on 04/22/2022. -Vascular surgery/primary team recommended left AKA however patient initially declined. -Palliative care consulted and following  and spoke with patient and family and patient decided to have left AKA which was done on 04/30/2022 per vascular surgery. -Continue aspirin, statin. -Pain management per primary team, R groin VAC changed   2.  SIRS -Patient noted to be tachycardic, tachypneic with a leukocytosis white count of 18.5 with noted to be trending down. -Prior blood cultures done with no growth to date. -Patient noted to have been on Zosyn which has subsequently been discontinued. -UA noted moderate hemoglobin, small leukocytes, rare bacteria, 6-10 WBCs.  Patient with no urinary symptoms. -Without respiratory symptoms. -Leukocytosis trended down and have normalized. -Blood cultures with no growth to date   3.  Hypertensive urgency -Patient noted to have blood pressure elevated 198/86 with heart rates in the 120s. -Likely a component of pain contributing to hypertensive urgency. -Patient noted to not be on any antihypertensive medications prior to admission. -Patient noted to have been on hydralazine 10 mg twice daily however did not obtain adequate blood pressure control. -Continue Coreg 6.25 mg twice daily, Norvasc 10 mg daily, hydralazine 25 mg twice daily. -BP presently stable   4.  Hypokalemia -Repleted.   -Potassium of 4.2   5.  Acute renal failure -Baseline creatinine approximately 1-1.2. -Renal function noted to trend up to 3.59 on 04/11/2022 and felt likely secondary to contrast given. -Renal function improving with creatinine trending down with hydration currently at 1.55. -Cr 1.37 this AM and stable  6.  Urinary frequency -Patient noted to have urinary frequency without dysuria.   7.  Tobacco abuse -Tobacco  cessation. -Nicotine patch.   8.  GERD -Continue PPI.   9.  Postop acute blood loss anemia -Follow H&H trends, stable -Transfusion threshold hemoglobin < 7.       Subjective: No complaints this AM  Physical Exam: Vitals:   05/07/22 0431 05/07/22 0728 05/07/22 1118 05/07/22 1649  BP:  (!) 153/82 131/69 (!) 143/71  Pulse: 99 (!) 103 72 90  Resp: 15 16 15 13   Temp:  98.2 F (36.8 C) 98.3 F (36.8 C) 98.3 F (36.8 C)  TempSrc:  Oral Oral Oral  SpO2:  100% 100% 99%  Weight:      Height:       General exam: Awake, laying in bed, in nad Respiratory system: Normal respiratory effort, no wheezing Cardiovascular system: regular rate, s1, s2 Gastrointestinal system: Soft, nondistended, positive BS Central nervous system: CN2-12 grossly intact, strength intact Extremities: Perfused, no clubbing Skin: Normal skin turgor, no notable skin lesions seen Psychiatry: Mood normal // no visual hallucinations   Data Reviewed:  Labs reviewed: Na 137, K 4.2, Cr 1.37  Family Communication: Pt in room, family not at bedside  Disposition: Status is: Inpatient Remains inpatient appropriate because: Severity of illness  Planned Discharge Destination:  Uncertain at this time    Author: , MD 05/07/2022 5:19 PM  For on call review [email or web address].

## 2022-05-07 NOTE — Consult Note (Addendum)
WOC Nurse wound follow up Patient receiving care in [facility] 4E12 Spoke with Dr. Chestine Spore. No issues with wound at this point, just waiting POC for DC. SNF vs HH with home vac (Ins. Issues) Wound type: Surgical Measurement: 8.5 cm x 5 cm x 0.6 cm Wound bed: beefy red tissue, compared to last photo taken the wound is about the same but has decreased in size.  Drainage (amount, consistency, odor) serosanguinous Periwound: intact Dressing procedure/placement/frequency: One piece of black foam removed. Wound cleaned with NS. One piece of black foam replaced with the wound outlined with two barrier rings. Drape applied, immediate suction obtained at 75 mmHg.    WOC will follow MWF.   Renaldo Reel Katrinka Blazing, MSN, RN, CMSRN, Angus Seller, [facility] Wound Treatment Associate Pager [phone]

## 2022-05-07 NOTE — Progress Notes (Addendum)
  Progress Note    05/07/2022 8:33 AM 7 Days Post-Op  Subjective:  no complaints.  Had vac change this morning   Vitals:   05/07/22 0431 05/07/22 0728  BP:  (!) 153/82  Pulse: 99 (!) 103  Resp: 15 16  Temp:  98.2 F (36.8 C)  SpO2:  100%   Physical Exam: Lungs:  non labored Incisions:  AKA incisions healing well Neurologic: A&O  CBC    Component Value Date/Time   WBC 9.9 05/07/2022 0632   RBC 2.84 (L) 05/07/2022 0632   HGB 8.1 (L) 05/07/2022 0632   HCT 25.8 (L) 05/07/2022 0632   PLT 334 05/07/2022 0632   MCV 90.8 05/07/2022 0632   MCH 28.5 05/07/2022 0632   MCHC 31.4 05/07/2022 0632   RDW 16.1 (H) 05/07/2022 0632   LYMPHSABS 1.8 05/01/2022 0202   MONOABS 1.2 (H) 05/01/2022 0202   EOSABS 0.0 05/01/2022 0202   BASOSABS 0.0 05/01/2022 0202    BMET    Component Value Date/Time   NA 137 05/07/2022 0632   K 4.2 05/07/2022 0632   CL 103 05/07/2022 0632   CO2 24 05/07/2022 0632   GLUCOSE 107 (H) 05/07/2022 0632   BUN 18 05/07/2022 0632   CREATININE 1.37 (H) 05/07/2022 0632   CALCIUM 8.7 (L) 05/07/2022 0632   GFRNONAA >60 05/07/2022 0632   GFRAA >60 01/12/2020 1421    INR    Component Value Date/Time   INR 1.2 04/11/2022 0437     Intake/Output Summary (Last 24 hours) at 05/07/2022 0833 Last data filed at 05/07/2022 0006 Gross per 24 hour  Intake 480 ml  Output 550 ml  Net -70 ml     Assessment/Plan:  51 y.o. male is s/p B AKA 7 Days Post-Op   AKA incisions are well appearing R groin vac changed this morning with WOC RN Dispo: without insurance TOC is unable to obtain a wound vac; SNF still pending   Emilie Rutter, PA-C [facility] [phone] 05/07/2022 8:33 AM  I have seen and evaluated the patient. I agree with the PA note as documented above.  Status post aortobifemoral bypass with bilateral fem-tib bypasses for critical limb ischemia with tissue loss.  Ultimately required bilateral AKA's.  Actually looks very good.  Both  AKA's are healing with staples in place.  Midline and left groin incision are healing.  Right groin VAC was changed today and this is granulating nicely with muscle flap.  Social issues regarding discharge.  CIR does not feel he is a good candidate.  He is refusing SNF.  He has no insurance so getting a VAC for home is challenging.  Cephus Shelling, MD [facility] [place] Office: [phone]

## 2022-05-07 NOTE — Plan of Care (Signed)
  Problem: Activity: Goal: Ability to return to baseline activity level will improve Outcome: Not Progressing   Problem: Health Behavior/Discharge Planning: Goal: Ability to safely manage health-related needs after discharge will improve Outcome: Not Progressing

## 2022-05-08 LAB — COMPREHENSIVE METABOLIC PANEL
ALT: 37 U/L (ref 0–44)
AST: 28 U/L (ref 15–41)
Albumin: 2.3 g/dL — ABNORMAL LOW (ref 3.5–5.0)
Alkaline Phosphatase: 97 U/L (ref 38–126)
Anion gap: 9 (ref 5–15)
BUN: 18 mg/dL (ref 6–20)
CO2: 25 mmol/L (ref 22–32)
Calcium: 8.7 mg/dL — ABNORMAL LOW (ref 8.9–10.3)
Chloride: 104 mmol/L (ref 98–111)
Creatinine, Ser: 1.45 mg/dL — ABNORMAL HIGH (ref 0.61–1.24)
GFR, Estimated: 59 mL/min — ABNORMAL LOW (ref >60)
Glucose, Bld: 113 mg/dL — ABNORMAL HIGH (ref 70–99)
Potassium: 4.3 mmol/L (ref 3.5–5.1)
Sodium: 138 mmol/L (ref 135–145)
Total Bilirubin: 0.6 mg/dL (ref 0.3–1.2)
Total Protein: 6.5 g/dL (ref 6.5–8.1)

## 2022-05-08 LAB — CBC
HCT: 25 % — ABNORMAL LOW (ref 39.0–52.0)
Hemoglobin: 8.2 g/dL — ABNORMAL LOW (ref 13.0–17.0)
MCH: 29.2 pg (ref 26.0–34.0)
MCHC: 32.8 g/dL (ref 30.0–36.0)
MCV: 89 fL (ref 80.0–100.0)
Platelets: 293 10*3/uL (ref 150–400)
RBC: 2.81 MIL/uL — ABNORMAL LOW (ref 4.22–5.81)
RDW: 16.4 % — ABNORMAL HIGH (ref 11.5–15.5)
WBC: 11.3 10*3/uL — ABNORMAL HIGH (ref 4.0–10.5)
nRBC: 0 % (ref 0.0–0.2)

## 2022-05-08 NOTE — Progress Notes (Addendum)
  Progress Note    05/08/2022 9:18 AM 8 Days Post-Op  Subjective:  no complaints. Wants to go home   Vitals:   05/08/22 0422 05/08/22 0730  BP: (!) 154/74 134/77  Pulse: (!) 105 100  Resp: 15 13  Temp: (!) 97.4 F (36.3 C) 98.1 F (36.7 C)  SpO2: 100% 100%   Physical Exam: Cardiac: tachy Lungs:  non labored Incisions:  B AKAs healing well with staples in place. Right groin with VAC to suction. Midline and left groins healing Abdomen: non distended Neurologic: alert and oriented  CBC    Component Value Date/Time   WBC 11.3 (H) 05/08/2022 0219   RBC 2.81 (L) 05/08/2022 0219   HGB 8.2 (L) 05/08/2022 0219   HCT 25.0 (L) 05/08/2022 0219   PLT 293 05/08/2022 0219   MCV 89.0 05/08/2022 0219   MCH 29.2 05/08/2022 0219   MCHC 32.8 05/08/2022 0219   RDW 16.4 (H) 05/08/2022 0219   LYMPHSABS 1.8 05/01/2022 0202   MONOABS 1.2 (H) 05/01/2022 0202   EOSABS 0.0 05/01/2022 0202   BASOSABS 0.0 05/01/2022 0202    BMET    Component Value Date/Time   NA 138 05/08/2022 0219   K 4.3 05/08/2022 0219   CL 104 05/08/2022 0219   CO2 25 05/08/2022 0219   GLUCOSE 113 (H) 05/08/2022 0219   BUN 18 05/08/2022 0219   CREATININE 1.45 (H) 05/08/2022 0219   CALCIUM 8.7 (L) 05/08/2022 0219   GFRNONAA 59 (L) 05/08/2022 0219   GFRAA >60 01/12/2020 1421    INR    Component Value Date/Time   INR 1.2 04/11/2022 0437     Intake/Output Summary (Last 24 hours) at 05/08/2022 0918 Last data filed at 05/08/2022 0758 Gross per 24 hour  Intake 0 ml  Output 1000 ml  Net -1000 ml     Assessment/Plan:  51 y.o. male is s/p B AKA 8 Days Post-Op   AKA incisions are well appearing R groin VAC to suction Midline incision healing well  Not candidate for CIR Refusing SNF. Wants to d/c home No insurance for wound VAC and would require a lot of assistance at home for wound care etc Next VAC change on Monday   Graceann Congress, [place] [facility] [phone] 05/08/2022 9:18  AM  VASCULAR STAFF ADDENDUM: I have independently interviewed and examined the patient. I agree with the above.  AKAs healing well. Groin with VAC. Appreciate social work efforts with disposition  Fara Olden, MD [facility] Phone Number: [phone] 05/08/2022 10:04 AM

## 2022-05-08 NOTE — Progress Notes (Signed)
Progress Note   Patient: Jeffrey Fitzgerald [id number]:[id number] DOB: [birth year] DOA: 04/01/2022     37 DOS: the patient was seen and examined on 05/08/2022   Brief hospital course: 51 y.o. male with past medical history of hypertension, peripheral vascular disease, and tobacco abuse who presented initially for critical left lower limb ischemia 8/3 requiring aortobifemoral bypass.  He then underwent left common femoral and posterior tibial bypass on 8/9 same-day for right common revision of the right lower extremity aortobifemoral bypass.  He returned to the OR on 8/11 and underwent thrombectomy of left common femoral to posterior tibial artery bypass, right lower extremity fasciotomy closure, and wound VAC placement.  White blood cell count had trended up to 24.8 and the patient has been temporarily treated empirically with Zosyn.  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  His hospital stay had been complicated by hemorrhagic bleed with hemoglobin trending down to 6 g/dL requiring 2 units of packed red blood cells and creatinine trended up to 3.59 before trending down.    He has been complaining of pain out of left leg which was noted to be cold and contracted.  Vascular had discussed need of left above-knee amputation, but he was not willing to undergo amputation of the left leg.  Palliative care had been consulted.  Patient was noted to become more tachycardic and hypertensive with blood pressures elevated into the 200s at times.  He denies any complaints of any chest pain, nausea, vomiting, or diarrhea.  Patient's white blood cell counts have been otherwise trending down.  TRH consulted to help with medical management.    Assessment and Plan: #1 critical limb ischemia -Patient presented with critical limb  ischemia bilateral lower extremities required several attempts at revascularization. -Patient subsequently underwent right AKA on 04/22/2022. -Vascular surgery/primary team recommended left AKA however patient initially declined. -Palliative care consulted and following  and spoke with patient and family and patient decided to have left AKA which was done on 04/30/2022 per vascular surgery. -Continue aspirin, statin. -Pain management per primary team, R groin VAC continued   2.  SIRS -Patient noted to be tachycardic, tachypneic with a leukocytosis white count of 18.5 with noted to be trending down. -Prior blood cultures done with no growth to date. -Patient noted to have been on Zosyn which has subsequently been discontinued. -UA noted moderate hemoglobin, small leukocytes, rare bacteria, 6-10 WBCs.  Patient with no urinary symptoms. -Without respiratory symptoms. -Leukocytosis trended down and have normalized. -Blood cultures with no growth to date   3.  Hypertensive urgency -Patient noted to have blood pressure elevated 198/86 with heart rates in the 120s. -Likely a component of pain contributing to hypertensive urgency. -Patient noted to not be on any antihypertensive medications prior to admission. -Patient noted to have been on hydralazine 10 mg twice daily however did not obtain adequate blood pressure control. -Continue Coreg 6.25 mg twice daily, Norvasc 10 mg daily, hydralazine 25 mg twice daily. -BP now controlled  4.  Hypokalemia -Repleted.   -Potassium of 4.3   5.  Acute renal failure -Baseline creatinine approximately 1-1.2. -Renal function noted to trend up to 3.59 on 04/11/2022 and felt likely secondary to contrast given. -Renal function improving with creatinine trending down with hydration currently at 1.55. -Cr 1.45 this AM and stable  6.  Urinary frequency -Patient noted to have urinary frequency without dysuria.   7.  Tobacco abuse -Tobacco  cessation. -Nicotine patch.   8.  GERD -Continue PPI.   9.  Postop acute blood loss anemia -Follow H&H trends, stable -Transfusion threshold hemoglobin < 7.       Subjective: Without complaints this AM  Physical Exam: Vitals:   05/07/22 2321 05/08/22 0422 05/08/22 0730 05/08/22 1138  BP: 104/72 (!) 154/74 134/77 123/64  Pulse: (!) 108 (!) 105 100 99  Resp: 18 15 13 10   Temp: 97.7 F (36.5 C) (!) 97.4 F (36.3 C) 98.1 F (36.7 C) 98.1 F (36.7 C)  TempSrc: Oral Oral Oral Oral  SpO2: 100% 100% 100% 100%  Weight:      Height:       General exam: Conversant, in no acute distress Respiratory system: normal chest rise, clear, no audible wheezing Cardiovascular system: regular rhythm, s1-s2 Gastrointestinal system: Nondistended, nontender, pos BS Central nervous system: No seizures, no tremors Extremities: No cyanosis, no joint deformities, s/p BLE amputation Skin: No rashes, no pallor Psychiatry: Affect normal // no auditory hallucinations   Data Reviewed:  Labs reviewed: Na 138, K 4.3, Cr 1.45  Family Communication: Pt in room, family not at bedside  Disposition: Status is: Inpatient Remains inpatient appropriate because: Severity of illness  Planned Discharge Destination:  Uncertain at this time    Author: , MD 05/08/2022 3:17 PM  For on call review [email or web address].

## 2022-05-08 NOTE — Plan of Care (Signed)
  Problem: Activity: Goal: Ability to return to baseline activity level will improve Outcome: Not Progressing   

## 2022-05-09 LAB — COMPREHENSIVE METABOLIC PANEL
ALT: 35 U/L (ref 0–44)
AST: 32 U/L (ref 15–41)
Albumin: 2.5 g/dL — ABNORMAL LOW (ref 3.5–5.0)
Alkaline Phosphatase: 102 U/L (ref 38–126)
Anion gap: 9 (ref 5–15)
BUN: 19 mg/dL (ref 6–20)
CO2: 25 mmol/L (ref 22–32)
Calcium: 8.9 mg/dL (ref 8.9–10.3)
Chloride: 104 mmol/L (ref 98–111)
Creatinine, Ser: 1.39 mg/dL — ABNORMAL HIGH (ref 0.61–1.24)
GFR, Estimated: >60 mL/min (ref >60)
Glucose, Bld: 110 mg/dL — ABNORMAL HIGH (ref 70–99)
Potassium: 4.2 mmol/L (ref 3.5–5.1)
Sodium: 138 mmol/L (ref 135–145)
Total Bilirubin: 0.7 mg/dL (ref 0.3–1.2)
Total Protein: 6.9 g/dL (ref 6.5–8.1)

## 2022-05-09 LAB — CBC
HCT: 26.6 % — ABNORMAL LOW (ref 39.0–52.0)
Hemoglobin: 8.6 g/dL — ABNORMAL LOW (ref 13.0–17.0)
MCH: 29.1 pg (ref 26.0–34.0)
MCHC: 32.3 g/dL (ref 30.0–36.0)
MCV: 89.9 fL (ref 80.0–100.0)
Platelets: 305 10*3/uL (ref 150–400)
RBC: 2.96 MIL/uL — ABNORMAL LOW (ref 4.22–5.81)
RDW: 16.3 % — ABNORMAL HIGH (ref 11.5–15.5)
WBC: 9.9 10*3/uL (ref 4.0–10.5)
nRBC: 0 % (ref 0.0–0.2)

## 2022-05-09 NOTE — Progress Notes (Signed)
Progress Note   Patient: Jeffrey Fitzgerald [id number]:[id number] DOB: [birth year] DOA: 04/01/2022     38 DOS: the patient was seen and examined on 05/09/2022   Brief hospital course: 51 y.o. male with past medical history of hypertension, peripheral vascular disease, and tobacco abuse who presented initially for critical left lower limb ischemia 8/3 requiring aortobifemoral bypass.  He then underwent left common femoral and posterior tibial bypass on 8/9 same-day for right common revision of the right lower extremity aortobifemoral bypass.  He returned to the OR on 8/11 and underwent thrombectomy of left common femoral to posterior tibial artery bypass, right lower extremity fasciotomy closure, and wound VAC placement.  White blood cell count had trended up to 24.8 and the patient has been temporarily treated empirically with Zosyn.  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  His hospital stay had been complicated by hemorrhagic bleed with hemoglobin trending down to 6 g/dL requiring 2 units of packed red blood cells and creatinine trended up to 3.59 before trending down.    He has been complaining of pain out of left leg which was noted to be cold and contracted.  Vascular had discussed need of left above-knee amputation, but he was not willing to undergo amputation of the left leg.  Palliative care had been consulted.  Patient was noted to become more tachycardic and hypertensive with blood pressures elevated into the 200s at times.  He denies any complaints of any chest pain, nausea, vomiting, or diarrhea.  Patient's white blood cell counts have been otherwise trending down.  TRH consulted to help with medical management.    Assessment and Plan: #1 critical limb ischemia -Patient presented with critical limb  ischemia bilateral lower extremities required several attempts at revascularization. -Patient subsequently underwent right AKA on 04/22/2022. -Vascular surgery/primary team recommended left AKA however patient initially declined. -Palliative care consulted and following  and spoke with patient and family and patient decided to have left AKA which was done on 04/30/2022 per vascular surgery. -Continue aspirin, statin. -Pain management per primary team, R LE wound vac managed by Vascular   2.  SIRS -Patient noted to be tachycardic, tachypneic with a leukocytosis white count of 18.5 with noted to be trending down. -Prior blood cultures done with no growth to date. -Patient noted to have been on Zosyn which has subsequently been discontinued. -UA noted moderate hemoglobin, small leukocytes, rare bacteria, 6-10 WBCs.  Patient with no urinary symptoms. -Without respiratory symptoms. -Leukocytosis trended down and have normalized. -Blood cultures with no growth to date   3.  Hypertensive urgency -Patient noted to have blood pressure elevated 198/86 with heart rates in the 120s. -Likely a component of pain contributing to hypertensive urgency. -Patient noted to not be on any antihypertensive medications prior to admission. -Patient noted to have been on hydralazine 10 mg twice daily however did not obtain adequate blood pressure control. -Continue Coreg 6.25 mg twice daily, Norvasc 10 mg daily, hydralazine 25 mg twice daily. -BP now controlled  4.  Hypokalemia -Repleted.   -Potassium of 4.2   5.  Acute renal failure -Baseline creatinine approximately 1-1.2. -Renal function noted to trend up to 3.59 on 04/11/2022 and felt likely secondary to contrast given. -Renal function improving with creatinine trending down with hydration currently at 1.55. -Cr 1.39 this AM and  stable   6.  Urinary frequency -Patient noted to have urinary frequency without dysuria.   7.  Tobacco abuse -Tobacco  cessation. -Nicotine patch.   8.  GERD -Continue PPI.   9.  Postop acute blood loss anemia -Follow H&H trends, stable -Transfusion threshold hemoglobin < 7.       Subjective: Denies complaints this AM  Physical Exam: Vitals:   05/09/22 0022 05/09/22 0501 05/09/22 0735 05/09/22 1746  BP: 138/77 (!) 146/82 137/79 (!) 145/80  Pulse: 100 100 100 99  Resp: 18 20 14 17   Temp: 98.4 F (36.9 C) 98.5 F (36.9 C) 98.1 F (36.7 C) 98.1 F (36.7 C)  TempSrc: Oral Oral Oral Oral  SpO2: 100% 100% 100% 100%  Weight:      Height:       General exam: Conversant, in no acute distress Respiratory system: normal chest rise, clear, no audible wheezing Cardiovascular system: regular rhythm, s1-s2 Gastrointestinal system: Nondistended, nontender, pos BS Central nervous system: No seizures, no tremors Extremities: No cyanosis, no joint deformities, s/p BLE amputation Skin: No rashes, no pallor Psychiatry: Affect normal // no auditory hallucinations   Data Reviewed:  Labs reviewed: Na 138, K 4.2, Cr 1.39  Family Communication: Pt in room, family not at bedside  Disposition: Status is: Inpatient Remains inpatient appropriate because: Severity of illness  Planned Discharge Destination:  Uncertain at this time    Author: , MD 05/09/2022 5:53 PM  For on call review [email or web address].

## 2022-05-10 LAB — COMPREHENSIVE METABOLIC PANEL
ALT: 32 U/L (ref 0–44)
AST: 28 U/L (ref 15–41)
Albumin: 2.6 g/dL — ABNORMAL LOW (ref 3.5–5.0)
Alkaline Phosphatase: 106 U/L (ref 38–126)
Anion gap: 6 (ref 5–15)
BUN: 20 mg/dL (ref 6–20)
CO2: 28 mmol/L (ref 22–32)
Calcium: 8.9 mg/dL (ref 8.9–10.3)
Chloride: 103 mmol/L (ref 98–111)
Creatinine, Ser: 1.44 mg/dL — ABNORMAL HIGH (ref 0.61–1.24)
GFR, Estimated: 59 mL/min — ABNORMAL LOW (ref >60)
Glucose, Bld: 111 mg/dL — ABNORMAL HIGH (ref 70–99)
Potassium: 4.2 mmol/L (ref 3.5–5.1)
Sodium: 137 mmol/L (ref 135–145)
Total Bilirubin: 0.9 mg/dL (ref 0.3–1.2)
Total Protein: 7.1 g/dL (ref 6.5–8.1)

## 2022-05-10 LAB — CBC
HCT: 29.6 % — ABNORMAL LOW (ref 39.0–52.0)
Hemoglobin: 9.4 g/dL — ABNORMAL LOW (ref 13.0–17.0)
MCH: 28.7 pg (ref 26.0–34.0)
MCHC: 31.8 g/dL (ref 30.0–36.0)
MCV: 90.2 fL (ref 80.0–100.0)
Platelets: 330 10*3/uL (ref 150–400)
RBC: 3.28 MIL/uL — ABNORMAL LOW (ref 4.22–5.81)
RDW: 16.4 % — ABNORMAL HIGH (ref 11.5–15.5)
WBC: 9.4 10*3/uL (ref 4.0–10.5)
nRBC: 0 % (ref 0.0–0.2)

## 2022-05-10 NOTE — Consult Note (Signed)
WOC Nurse wound follow up Patient receiving care in [facility] 4E12 Wound type: Surgical Measurement: 8.5 cm x 4.5 cm x 0.4 cm Wound bed: beefy red tissue, wound looks good, decreasing in size.  Drainage (amount, consistency, odor) serous Periwound: intact Dressing procedure/placement/frequency: One piece of black foam removed. One piece of black foam replaced with the wound outlined with a barrier ring. Drape applied, immediate suction obtained at 75 mmHg.   Two dressing kits at bedside in cubby under the sink.    WOC will follow MWF.   Renaldo Reel Katrinka Blazing, MSN, RN, CMSRN, Angus Seller, [facility] Wound Treatment Associate Pager [phone]

## 2022-05-10 NOTE — TOC Progression Note (Signed)
Transition of Care (TOC) - Progression Note  Donn Pierini RN, BSN Transitions of Care Unit 4E- RN Case Manager See Treatment Team for direct phone #    Patient Details  Name: Jeffrey Fitzgerald MRN: [id number] Date of Birth: 10-Jan-1971  Transition of Care [facility]) CM/SW Contact  Zenda Alpers, Lenn Sink, RN Phone Number: 05/10/2022, 2:15 PM  Clinical Narrative:    CM received msg from [facility] that sister had called them to discuss questions regarding his next steps, discharge planning, etc. CM had attempted to call sister [name] last week and left msg on both 9/6 and 9/8 with no return call to this Clinical research associate.  Call attempted again this AM after msg received from [facility]- again no answer and VM msg left requesting return call- at this time awaiting return call from sister.  CM will continue to reach out to sister in attempt to discuss discharge planning and barriers.    Expected Discharge Plan: Home w Home Health Services Barriers to Discharge: Continued Medical Work up  Expected Discharge Plan and Services Expected Discharge Plan: Home w Home Health Services In-house Referral: Clinical Social Work Discharge Planning Services: CM Consult   Living arrangements for the past 2 months: Single Family Home                 DME Arranged: Hospital bed, Other see comment, Wheelchair manual DME Agency: [employer]                   Social Determinants of Health (SDOH) Interventions    Readmission Risk Interventions     No data to display

## 2022-05-10 NOTE — Progress Notes (Signed)
This RN received call from patient's sister [name], asking for guidance related to patient's discharge planning and current status. This RN reached out to [facility] LCSW and case manager to assist with answering these, as our team has been peripherally involved. Kristi to reach out to her today.   Shelda Jakes, MSN, RN4 Palliative Medicine Team [phone]

## 2022-05-10 NOTE — Progress Notes (Addendum)
  Progress Note    05/10/2022 7:49 AM 10 Days Post-Op  Subjective:  no complaints   Vitals:   05/09/22 2325 05/10/22 0335  BP: 134/82 (!) 149/75  Pulse: (!) 105 100  Resp: 16 16  Temp: 98.3 F (36.8 C) 98.3 F (36.8 C)  SpO2: 100% 100%   Physical Exam: Cardiac:  tachycardia Lungs: non labored Incisions:  B AKAs well appearing, viable. Right groin VAC to suction. Midline and left groin incisions healing well Extremities:  well perfused and warm Neurologic: alert and oriented  CBC    Component Value Date/Time   WBC 9.4 05/10/2022 0301   RBC 3.28 (L) 05/10/2022 0301   HGB 9.4 (L) 05/10/2022 0301   HCT 29.6 (L) 05/10/2022 0301   PLT 330 05/10/2022 0301   MCV 90.2 05/10/2022 0301   MCH 28.7 05/10/2022 0301   MCHC 31.8 05/10/2022 0301   RDW 16.4 (H) 05/10/2022 0301   LYMPHSABS 1.8 05/01/2022 0202   MONOABS 1.2 (H) 05/01/2022 0202   EOSABS 0.0 05/01/2022 0202   BASOSABS 0.0 05/01/2022 0202    BMET    Component Value Date/Time   NA 137 05/10/2022 0301   K 4.2 05/10/2022 0301   CL 103 05/10/2022 0301   CO2 28 05/10/2022 0301   GLUCOSE 111 (H) 05/10/2022 0301   BUN 20 05/10/2022 0301   CREATININE 1.44 (H) 05/10/2022 0301   CALCIUM 8.9 05/10/2022 0301   GFRNONAA 59 (L) 05/10/2022 0301   GFRAA >60 01/12/2020 1421    INR    Component Value Date/Time   INR 1.2 04/11/2022 0437     Intake/Output Summary (Last 24 hours) at 05/10/2022 0749 Last data filed at 05/09/2022 1859 Gross per 24 hour  Intake 480 ml  Output 600 ml  Net -120 ml     Assessment/Plan:  51 y.o. male is s/p B AKAs 10 Days Post-Op   AKA incisions are well appearing R groin VAC to suction Midline incision and left groin healing well  Refusing SNF. Wants to d/c home No insurance for wound VAC and would require a lot of assistance at home for wound care etc [facility] change today    Dory Horn [facility] [phone] 05/10/2022 7:49 AM  I have seen and  evaluated the patient. I agree with the PA note as documented above.  Status post aortobifem with bilateral fem-tib bypasses.  Ultimately required bilateral AKA's.  Both AKA incisions are healing well with staples.  Midline and left groin incision healed.  VAC to the right groin with sartorius muscle flap.  This has looked really good.  Next VAC change today with wound care.  Difficult placement issue given patient wants to go home and has no insurance.  CIR signed off.  Refusing SNF.  Cephus Shelling, MD [facility] [place] Office: [phone]

## 2022-05-10 NOTE — Progress Notes (Signed)
Progress Note   Patient: Jeffrey Fitzgerald URK:[id number] DOB: July 30, 1971 DOA: 04/01/2022     39 DOS: the patient was seen and examined on 05/10/2022   Brief hospital course: 51 y.o. male with past medical history of hypertension, peripheral vascular disease, and tobacco abuse who presented initially for critical left lower limb ischemia 8/3 requiring aortobifemoral bypass.  He then underwent left common femoral and posterior tibial bypass on 8/9 same-day for right common revision of the right lower extremity aortobifemoral bypass.  He returned to the OR on 8/11 and underwent thrombectomy of left common femoral to posterior tibial artery bypass, right lower extremity fasciotomy closure, and wound VAC placement.  White blood cell count had trended up to 24.8 and the patient has been temporarily treated empirically with Zosyn.  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  His hospital stay had been complicated by hemorrhagic bleed with hemoglobin trending down to 6 g/dL requiring 2 units of packed red blood cells and creatinine trended up to 3.59 before trending down.    He has been complaining of pain out of left leg which was noted to be cold and contracted.  Vascular had discussed need of left above-knee amputation, but he was not willing to undergo amputation of the left leg.  Palliative care had been consulted.  Patient was noted to become more tachycardic and hypertensive with blood pressures elevated into the 200s at times.  He denies any complaints of any chest pain, nausea, vomiting, or diarrhea.  Patient's white blood cell counts have been otherwise trending down.  TRH consulted to help with medical management.    Assessment and Plan: #1 critical limb ischemia -Patient presented with critical limb  ischemia bilateral lower extremities required several attempts at revascularization. -Patient subsequently underwent right AKA on 04/22/2022. -Vascular surgery/primary team recommended left AKA however patient initially declined. -Palliative care consulted and following  and spoke with patient and family and patient decided to have left AKA which was done on 04/30/2022 per vascular surgery. -Continue aspirin, statin. -Pain management per primary team, R LE wound vac continued, per vasc surgery   2.  SIRS -Patient noted to be tachycardic, tachypneic with a leukocytosis white count of 18.5 with noted to be trending down. -Prior blood cultures done with no growth to date. -Patient noted to have been on Zosyn which has subsequently been discontinued. -UA noted moderate hemoglobin, small leukocytes, rare bacteria, 6-10 WBCs.  Patient with no urinary symptoms. -Without respiratory symptoms. -Leukocytosis trended down and have normalized. -Blood cultures with no growth to date   3.  Hypertensive urgency -Patient noted to have blood pressure elevated 198/86 with heart rates in the 120s. -Likely a component of pain contributing to hypertensive urgency. -Patient noted to not be on any antihypertensive medications prior to admission. -Patient noted to have been on hydralazine 10 mg twice daily however did not obtain adequate blood pressure control. -Continue Coreg 6.25 mg twice daily, Norvasc 10 mg daily, hydralazine 25 mg twice daily. -BP now controlled  4.  Hypokalemia -Repleted.   -Potassium of 4.2   5.  Acute renal failure -Baseline creatinine approximately 1-1.2. -Cr noted to trend up to 3.59 on 04/11/2022 and felt likely secondary to contrast given. -Renal function improving with creatinine trending down with hydration -Cr 1.44 this AM and stable  6.  Urinary frequency -Patient noted to have urinary frequency without dysuria.   7.  Tobacco abuse -Tobacco cessation. -Nicotine patch.    8.  GERD -Continue PPI.   9.  Postop acute blood loss anemia -Follow H&H trends, stable -Transfusion threshold hemoglobin < 7.       Subjective: Without complaints this AM  Physical Exam: Vitals:   05/09/22 1935 05/09/22 2325 05/10/22 0335 05/10/22 0930  BP: 127/68 134/82 (!) 149/75 129/73  Pulse: 91 (!) 105 100 (!) 103  Resp: 14 16 16 14   Temp: 98.4 F (36.9 C) 98.3 F (36.8 C) 98.3 F (36.8 C) 98.5 F (36.9 C)  TempSrc: Oral Oral Oral Oral  SpO2: 100% 100% 100% 100%  Weight:      Height:       General exam: Awake, laying in bed, in nad Respiratory system: Normal respiratory effort, no wheezing Cardiovascular system: regular rate, s1, s2 Gastrointestinal system: Soft, nondistended, positive BS Central nervous system: CN2-12 grossly intact, strength intact Extremities: Perfused, no clubbing Skin: Normal skin turgor, no notable skin lesions seen Psychiatry: Mood normal // no visual hallucinations   Data Reviewed:  Labs reviewed: Na 137, K 4.2, Cr 1.44  Family Communication: Pt in room, family not at bedside  Disposition: Status is: Inpatient Remains inpatient appropriate because: Severity of illness  Planned Discharge Destination:  Uncertain at this time    Author: , MD 05/10/2022 5:34 PM  For on call review [email or web address].

## 2022-05-10 NOTE — Progress Notes (Signed)
Physical Therapy Treatment Patient Details Name: Jeffrey Fitzgerald MRN: [id number] DOB: 1971/03/05 Today's Date: 05/10/2022   History of Present Illness Pt is a 51 yo male admitted 04/01/22 from cath lab after aortogram for LLE ischemia. S/p aortobifemoral bypass graft with bilateral femoral endarterectomy on 8/9. Post op RLE ischemia with return to OR same date for redo R groin exploration with thrombectomy and angioplasty. S/p RLE fem-pop bypass graft with 4 compartment fasciotomy on 8/10. S/p thrombectomy left femoral to popliteal BPG wtih closure and right fasciotomy wtih VAC on right LE on 8/11. Ileus with emesis on 8/14. Complicated hospital course. 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 PMH includes HTN, tobacco smoking, PVD.    PT Comments    Pt received supine and agreeable to session with continued focus on bed mobility and transfers with pt continuing to need max-total assist to scoot to<>from power chair. Pt refusing assist for bed mobility and transfers at start stating he could trasnfer on his own, allowed pt time and space to problem solve however pt unable to complete transfer and agreeable to accepting assist. Continued lengthy discussion about reality of being home alone during day and not able to move from bed, pt stating he is fine with that and has a cousin that will be there everyday when his sister is at out of the home to pick him up and lift him to his chair. This PTA expressed concern to pt for his safety and well being, however pt continues to delcine SNF, stating "cant rehab just come to my house" explained HHPT would be limited and he would have more opportunity to increase strength and independence at SNF before home, however pt contineus to be adament to go home. Pt receptive throughout conversation. Pt continues to benefit from skilled PT services to progress toward functional mobility goals.    Recommendations for follow up therapy are one component of a multi-disciplinary  discharge planning process, led by the attending physician.  Recommendations may be updated based on patient status, additional functional criteria and insurance authorization.  Follow Up Recommendations  Skilled nursing-short term rehab (<3 hours/day) (pt declines, will go home) Can patient physically be transported by private vehicle: No   Assistance Recommended at Discharge Intermittent Supervision/Assistance  Patient can return home with the following A lot of help with walking and/or transfers;A lot of help with bathing/dressing/bathroom;Assistance with cooking/housework;Direct supervision/assist for medications management;Direct supervision/assist for financial management;Assist for transportation;Help with stairs or ramp for entrance   Equipment Recommendations  Wheelchair (measurements PT);Wheelchair cushion (measurements PT);Hospital bed    Recommendations for Other Services       Precautions / Restrictions Precautions Precautions: Fall;Other (comment) Precaution Comments: R groin wound vac Restrictions Weight Bearing Restrictions: Yes RLE Weight Bearing: Non weight bearing LLE Weight Bearing: Non weight bearing     Mobility  Bed Mobility Overal bed mobility: Needs Assistance Bed Mobility: Supine to Sit, Rolling Rolling: Modified independent (Device/Increase time)   Supine to sit: Mod assist, HOB elevated Sit to supine: Modified independent (Device/Increase time)   General bed mobility comments: pt mod indep to roll with bed rail; modA for HHA to elevate trunk and scoot L hip towards EOB; return to supine without assist    Transfers Overall transfer level: Needs assistance Equipment used: None (bed pad >chair, under arms with pt arms around PTA>bed) Transfers: Bed to chair/wheelchair/BSC            Lateral/Scoot Transfers: Max assist, Total assist General transfer comment: maxA to scoot  hips with bed pad from elevated bed to electric scooter, pt able to  minimally assist with BUE support; totalA for lateral scoot from scooter back to bed due to more awkward positioning with less access to rail to pull, pt holding therapist neck/torso    Ambulation/Gait               General Gait Details: unable   Set designer Details (indicate cue type and reason): pt independent with power chair operation, however needing total assist to trasnfer to<>from  Modified Rankin (Stroke Patients Only)       Balance Overall balance assessment: Needs assistance Sitting-balance support: No upper extremity supported Sitting balance-Leahy Scale: Fair Sitting balance - Comments: inconsistent sitting balance EOB, at times able to maintain without UE support but unable to accept challenge                                    Cognition Arousal/Alertness: Awake/alert Behavior During Therapy: Flat affect Overall Cognitive Status: No family/caregiver present to determine baseline cognitive functioning Area of Impairment: Attention, Following commands, Safety/judgement, Awareness, Problem solving                     Memory: Decreased short-term memory Following Commands: Follows one step commands consistently, Follows one step commands with increased time Safety/Judgement: Decreased awareness of safety, Decreased awareness of deficits   Problem Solving: Requires verbal cues General Comments: Pt needs increased time and mod to max instructional/demonstrational cueing to initiate tasks.        Exercises Other Exercises Other Exercises: pt declinging further LE therex    General Comments General comments (skin integrity, edema, etc.): pt motivated for trasnfer to scooter, pt refusing assist at start stating he could trasnfer on his own, agreeable to accepting assist after increased time and difficulty problem solving, continued lengthy discussion about reality  of being home alone during day and not able to move from bed, pt stating he is fine and has a cousin that will be there everyday when sister is at work to pick him up and lift him to his chair. expressed concern to pt for his safety and well being, pt continues to delcine SNF, stating "cant rehab just come to my house" explained that it would be limited and he would have more opportunity to increase strength and independence at SNF before home, however pt contineus to be adament to go home.      Pertinent Vitals/Pain Pain Assessment Pain Assessment: Faces Faces Pain Scale: Hurts a little bit Pain Location: bottom Pain Descriptors / Indicators: Grimacing, Sore Pain Intervention(s): Monitored during session, Limited activity within patient's tolerance    Home Living                          Prior Function            PT Goals (current goals can now be found in the care plan section) Acute Rehab PT Goals Patient Stated Goal: to go home PT Goal Formulation: With patient Time For Goal Achievement: 05/20/22    Frequency    Min 3X/week      PT Plan      Co-evaluation              AM-PAC PT "6 Clicks" Mobility  Outcome Measure  Help needed turning from your back to your side while in a flat bed without using bedrails?: A Little Help needed moving from lying on your back to sitting on the side of a flat bed without using bedrails?: A Lot Help needed moving to and from a bed to a chair (including a wheelchair)?: Total Help needed standing up from a chair using your arms (e.g., wheelchair or bedside chair)?: Total Help needed to walk in hospital room?: Total Help needed climbing 3-5 steps with a railing? : Total 6 Click Score: 9    End of Session   Activity Tolerance: Patient tolerated treatment well Patient left: in bed;with call bell/phone within reach;with bed alarm set Nurse Communication: Mobility status PT Visit Diagnosis: Other abnormalities of gait and  mobility (R26.89);Difficulty in walking, not elsewhere classified (R26.2);Muscle weakness (generalized) (M62.81)     Time: [id number] PT Time Calculation (min) (ACUTE ONLY): 30 min  Charges:  $Therapeutic Activity: 8-22 mins $Self Care/Home Management: 8-22                     Audry Riles. PTA Acute Rehabilitation Services Office: [place] 05/10/2022, 4:17 PM

## 2022-05-11 LAB — COMPREHENSIVE METABOLIC PANEL
ALT: 31 U/L (ref 0–44)
AST: 26 U/L (ref 15–41)
Albumin: 2.5 g/dL — ABNORMAL LOW (ref 3.5–5.0)
Alkaline Phosphatase: 102 U/L (ref 38–126)
Anion gap: 5 (ref 5–15)
BUN: 21 mg/dL — ABNORMAL HIGH (ref 6–20)
CO2: 27 mmol/L (ref 22–32)
Calcium: 8.7 mg/dL — ABNORMAL LOW (ref 8.9–10.3)
Chloride: 105 mmol/L (ref 98–111)
Creatinine, Ser: 1.32 mg/dL — ABNORMAL HIGH (ref 0.61–1.24)
GFR, Estimated: >60 mL/min (ref >60)
Glucose, Bld: 111 mg/dL — ABNORMAL HIGH (ref 70–99)
Potassium: 4.2 mmol/L (ref 3.5–5.1)
Sodium: 137 mmol/L (ref 135–145)
Total Bilirubin: 0.7 mg/dL (ref 0.3–1.2)
Total Protein: 6.9 g/dL (ref 6.5–8.1)

## 2022-05-11 LAB — CBC
HCT: 27.5 % — ABNORMAL LOW (ref 39.0–52.0)
Hemoglobin: 9 g/dL — ABNORMAL LOW (ref 13.0–17.0)
MCH: 29 pg (ref 26.0–34.0)
MCHC: 32.7 g/dL (ref 30.0–36.0)
MCV: 88.7 fL (ref 80.0–100.0)
Platelets: 303 10*3/uL (ref 150–400)
RBC: 3.1 MIL/uL — ABNORMAL LOW (ref 4.22–5.81)
RDW: 16.3 % — ABNORMAL HIGH (ref 11.5–15.5)
WBC: 9.5 10*3/uL (ref 4.0–10.5)
nRBC: 0 % (ref 0.0–0.2)

## 2022-05-11 NOTE — Progress Notes (Signed)
Progress Note   Patient: Jeffrey Fitzgerald [id number]:[id number] DOB: Dec 12, 1970 DOA: 04/01/2022     40 DOS: the patient was seen and examined on 05/11/2022   Brief hospital course: 50 y.o. male with past medical history of hypertension, peripheral vascular disease, and tobacco abuse who presented initially for critical left lower limb ischemia 8/3 requiring aortobifemoral bypass.  He then underwent left common femoral and posterior tibial bypass on 8/9 same-day for right common revision of the right lower extremity aortobifemoral bypass.  He returned to the OR on 8/11 and underwent thrombectomy of left common femoral to posterior tibial artery bypass, right lower extremity fasciotomy closure, and wound VAC placement.  White blood cell count had trended up to 24.8 and the patient has been temporarily treated empirically with Zosyn.  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  His hospital stay had been complicated by hemorrhagic bleed with hemoglobin trending down to 6 g/dL requiring 2 units of packed red blood cells and creatinine trended up to 3.59 before trending down.    He has been complaining of pain out of left leg which was noted to be cold and contracted.  Vascular had discussed need of left above-knee amputation, but he was not willing to undergo amputation of the left leg.  Palliative care had been consulted.  Patient was noted to become more tachycardic and hypertensive with blood pressures elevated into the 200s at times.  He denies any complaints of any chest pain, nausea, vomiting, or diarrhea.  Patient's white blood cell counts have been otherwise trending down.  TRH consulted to help with medical management.    Assessment and Plan: #1 critical limb ischemia -Patient presented with critical limb  ischemia bilateral lower extremities required several attempts at revascularization. -Patient subsequently underwent right AKA on 04/22/2022. -Vascular surgery/primary team recommended left AKA however patient initially declined. -Palliative care consulted and following  and spoke with patient and family and patient decided to have left AKA which was done on 04/30/2022 per vascular surgery. -Continue aspirin, statin. -Pain management per primary team, continue RLE wound vac per Vascular Surgery -TOC following to asist with dispo planning. Pt declines SNF, no insurance for [facility] with vac changes   2.  SIRS -Patient noted to be tachycardic, tachypneic with a leukocytosis white count of 18.5 with noted to be trending down. -Prior blood cultures done with no growth to date. -Patient noted to have been on Zosyn which has subsequently been discontinued. -UA noted moderate hemoglobin, small leukocytes, rare bacteria, 6-10 WBCs.  Patient with no urinary symptoms. -Without respiratory symptoms. -Leukocytosis trended down and have normalized. -Blood cultures with no growth to date   3.  Hypertensive urgency -Patient noted to have blood pressure elevated 198/86 with heart rates in the 120s. -Likely a component of pain contributing to hypertensive urgency. -Patient noted to not be on any antihypertensive medications prior to admission. -Patient noted to have been on hydralazine 10 mg twice daily however did not obtain adequate blood pressure control. -Continue Coreg 6.25 mg twice daily, Norvasc 10 mg daily, hydralazine 25 mg twice daily. -BP now  stable and much improved  4.  Hypokalemia -Repleted.   -Potassium of 4.2   5.  Acute renal failure -Baseline creatinine approximately 1-1.2. -Cr noted to trend up to 3.59 on 04/11/2022 and felt likely secondary  to contrast given. -Renal function improving with creatinine trending down with hydration -Cr down to 1.32 today   6.  Urinary frequency -Patient  noted to have urinary frequency without dysuria.   7.  Tobacco abuse -Tobacco cessation. -Nicotine patch.   8.  GERD -Continue PPI.   9.  Postop acute blood loss anemia -Follow H&H trends, stable -Transfusion threshold hemoglobin < 7.       Subjective: No complaints this AM  Physical Exam: Vitals:   05/11/22 0345 05/11/22 0743 05/11/22 1000 05/11/22 1142  BP: 126/75 (!) 124/55 (!) 131/95 130/75  Pulse:  100 97 100  Resp: 10 14 14 14   Temp: 98.3 F (36.8 C) 97.9 F (36.6 C) 97.9 F (36.6 C) (!) 97.5 F (36.4 C)  TempSrc: Oral Oral Oral Oral  SpO2: 96% 100% 100% 100%  Weight:      Height:       General exam: Conversant, in no acute distress Respiratory system: normal chest rise, clear, no audible wheezing Cardiovascular system: regular rhythm, s1-s2 Gastrointestinal system: Nondistended, nontender, pos BS Central nervous system: No seizures, no tremors Extremities: No cyanosis, no joint deformities Skin: No rashes, no pallor Psychiatry: Affect normal // no auditory hallucinations   Data Reviewed:  Labs reviewed: Na 137, K 4.2, Cr 1.32  Family Communication: Pt in room, family not at bedside  Disposition: Status is: Inpatient Remains inpatient appropriate because: Severity of illness  Planned Discharge Destination:  Uncertain at this time    Author: , MD 05/11/2022 1:58 PM  For on call review [email or web address].

## 2022-05-11 NOTE — Progress Notes (Signed)
[facility] [place]  Subjective  -no complaints.  Wants to go home.   Objective 126/75 (!) 110 98.3 F (36.8 C) (Oral) 10 96%  Intake/Output Summary (Last 24 hours) at 05/11/2022 0657 Last data filed at 05/10/2022 0934 Gross per 24 hour  Intake --  Output 500 ml  Net -500 ml    Midline and left groin incision healing Right groin with VAC with good seal Bilateral AKA's healing  Laboratory Lab Results: Recent Labs    05/10/22 0301 05/11/22 0402  WBC 9.4 9.5  HGB 9.4* 9.0*  HCT 29.6* 27.5*  PLT 330 303   BMET Recent Labs    05/10/22 0301 05/11/22 0402  NA 137 137  K 4.2 4.2  CL 103 105  CO2 28 27  GLUCOSE 111* 111*  BUN 20 21*  CREATININE 1.44* 1.32*  CALCIUM 8.9 8.7*    COAG Lab Results  Component Value Date   INR 1.2 04/11/2022   INR 1.5 (H) 04/07/2022   INR 0.9 03/09/2022   No results found for: "PTT"  Assessment/Planning:  Status post aortobifemoral bypass with bilateral fem-tib bypasses ultimately required bilateral AKA's.  Both AKA's are healing.  Afebrile and white count normal.  Ongoing issues with discharge given CIR signed off and patient does not want SNF.  Needs home health with VAC changes to his right groin muscle flap but has no insurance.  Appreciate transition of care and case management.  Jeffrey Fitzgerald 05/11/2022 6:57 AM --

## 2022-05-11 NOTE — TOC Progression Note (Addendum)
Transition of Care [facility]) - Progression Note    Patient Details  Name: Jeffrey Fitzgerald MRN: [id number] Date of Birth: December 23, 1970  Transition of Care [facility]) CM/SW Contact  Eduard Roux, [place] Phone Number: 05/11/2022, 4:49 PM  Clinical Narrative:     CSW informed by [facility]- patient and family is agreeable to short term rehab at [facility]. CSW sent referrals to SNF-  TOC will provide bed offers when available TOC will continue to follow and assist with discharge planning.  Antony Blackbird, MSW, LCSW Clinical Social Worker    Expected Discharge Plan: Skilled Nursing Facility Barriers to Discharge: Inadequate or no insurance, SNF Pending bed offer  Expected Discharge Plan and Services Expected Discharge Plan: Skilled Nursing Facility In-house Referral: Clinical Social Work Discharge Planning Services: CM Consult   Living arrangements for the past 2 months: Single Family Home                 DME Arranged: Hospital bed, Other see comment, Wheelchair manual DME Agency: [employer]                   Social Determinants of Health (SDOH) Interventions    Readmission Risk Interventions     No data to display

## 2022-05-11 NOTE — TOC Progression Note (Signed)
Transition of Care (TOC) - Progression Note  Donn Pierini RN, BSN Transitions of Care Unit 4E- RN Case Manager See Treatment Team for direct phone #     Patient Details  Name: Jeffrey Fitzgerald MRN: [id number] Date of Birth: November 26, 1970  Transition of Care [facility]) CM/SW Contact  Zenda Alpers, Lenn Sink, RN Phone Number: 05/11/2022, 2:43 PM  Clinical Narrative:    Pt's sister [name] arrived to bedside, CM came to room to discuss transition needs and barriers with pt and sister- [name] at the bedside.  CM informed [name] that pt has been declined by CIR for admission as they do not feel he will tolerate intensity of program. Discussed as well pt's decline to look at SNF for rehab. Pt's sister explained that she has back issues that concerns her with regard to being able to provide needed assistance at home. Cousin that pt has spoken of that can "help" sister says is not reliable. Sister also reports she would need time to get home in order to allow for hospital bed and DME that pt will need. [name] works till [employer] every day and would need to find someone to help her as she does not feel pt would be safe to leave alone at this time while she works.  Sister is requesting to do SNF bed search- has discussed this with pt and pt is agreeable now- CM has explained that due to insurance barriers, wound VAC, etc- we may not get any bed offers- during discussion discussed backup would be returning home w/ HH- and having a HH agency come do wound VAC drsg changes.  Sister voiced understanding that if we can not find a SNF bed for patient we will have to look at returning home w/ [facility].   CM has reached out to [facility] who is now covering [place] referrals this week- spoke with Virginia Rochester has confirmed that [facility] can accept pt for [facility] needs for wound VAC drsg changes- tentative start of care for next week 9/18.  Pt would need [facility] as well- sister assisted with filling out [employer] home [facility] charity form  while she was here should we need that-  CM has also spoken with [facility] supervisor, Verdon Cummins,  to see if pt could get needed hospital bed and wheelchair under LOG (letter of guarantee)- working on cost of DME with [facility] and will f/u with [facility] supervisor should home be the final plan.     Current plan after discussion w/ sister and patient is to do a SNF bed search- understanding search will include out of the [place] area. Backup plan if no SNF bed offers received- Home w/ HH.  CSW updated and will start SNF bed search under LOG.   Msg also left for FC to follow up on Medicaid application.   TOC to continue to follow, will give a few days for bed search then follow up with sister.   Expected Discharge Plan: Home w Home Health Services Barriers to Discharge: Continued Medical Work up  Expected Discharge Plan and Services Expected Discharge Plan: Home w Home Health Services In-house Referral: Clinical Social Work Discharge Planning Services: CM Consult   Living arrangements for the past 2 months: Single Family Home                 DME Arranged: Hospital bed, Other see comment, Wheelchair manual DME Agency: [facility]                   Social Determinants  of Health (SDOH) Interventions    Readmission Risk Interventions     No data to display

## 2022-05-11 NOTE — NC FL2 (Signed)
[place] MEDICAID FL2 LEVEL OF CARE SCREENING TOOL     IDENTIFICATION  Patient Name: Jeffrey Fitzgerald Birthdate: February 03, 1971 Sex: male Admission Date (Current Location): 04/01/2022  [facility] and [place] Number:  Producer, television/film/video and Address:  [address] [facility], [address] [place], [place] [address]      Provider Number: [id number]  Attending Physician Name and Address:  Jerald Kief, MD  Relative Name and Phone Number:       Current Level of Care: Hospital Recommended Level of Care: Skilled Nursing Facility Prior Approval Number:    Date Approved/Denied:   PASRR Number: [id number]  Discharge Plan: SNF    Current Diagnoses: Patient Active Problem List   Diagnosis Date Noted   Acute postoperative anemia due to expected blood loss    Gastroesophageal reflux disease    SIRS (systemic inflammatory response syndrome) (HCC) 04/27/2022   Hypertensive urgency 04/27/2022   Hypokalemia 04/27/2022   Pressure injury of skin 04/13/2022   Acute renal failure [facility])    Other specified anemias    PAD (peripheral artery disease) (HCC) 04/07/2022   Critical limb ischemia of both lower extremities (HCC) 04/01/2022   Critical limb ischemia of left lower extremity with ulceration of ankle (HCC) 03/30/2022   Iliac artery occlusion, 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   Hypertension 01/25/2020   Left foot pain 01/25/2020    Orientation RESPIRATION BLADDER Height & Weight     Self, Time, Situation, Place  Normal External catheter, Incontinent Weight: 145 lb (65.8 kg) Height:  5\' 5"  (165.1 cm)  BEHAVIORAL SYMPTOMS/MOOD NEUROLOGICAL BOWEL NUTRITION STATUS      Incontinent Diet (please see discharge summary)  AMBULATORY STATUS COMMUNICATION OF NEEDS Skin   Extensive Assist Verbally Surgical wounds (Pressure injury coccylx mid-stage 2, closed incision abdomen, closed incision LF thigh, anterior, closed incision RT groin, closed incision RT leg, closed incision RT thigh,  closed incision LF leg,wound incision MASD scrotum posterior stage ll,)                       Personal Care Assistance Level of Assistance  Bathing, Feeding, Dressing Bathing Assistance: Maximum assistance Feeding assistance: Independent Dressing Assistance: Limited assistance     Functional Limitations Info  Sight, Hearing, Speech Sight Info: Adequate Hearing Info: Adequate Speech Info: Adequate    SPECIAL CARE FACTORS FREQUENCY  PT (By licensed PT), OT (By licensed OT)     PT Frequency: 5x per week OT Frequency: 5x per week            Contractures Contractures Info: Not present    Additional Factors Info  Code Status Code Status Info: FULL Allergies Info: NKA           Current Medications (05/11/2022):  This is the current hospital active medication list Current Facility-Administered Medications  Medication Dose Route Frequency Provider Last Rate Last Admin   0.9 %  sodium chloride infusion  250 mL Intravenous PRN 07/11/2022, PA-C 180 mL/hr at 04/20/22 0944 New Bag at 04/30/22 0952   0.9 %  sodium chloride infusion   Intravenous PRN 06/30/22, PA-C 10 mL/hr at 04/25/22 1616 Infusion Verify at 04/25/22 1616   acetaminophen (TYLENOL) tablet 325-650 mg  325-650 mg Oral Q4H PRN 04/27/22, PA-C   650 mg at 05/11/22 07/11/22   Or   acetaminophen (TYLENOL) suppository 325-650 mg  325-650 mg Rectal Q4H PRN 0932, PA-C       amLODipine (  NORVASC) tablet 10 mg  10 mg Oral Daily Clinton Gallant M, PA-C   10 mg at 05/11/22 1139   aspirin EC tablet 81 mg  81 mg Oral Daily Clinton Gallant M, PA-C   81 mg at 05/11/22 1139   atorvastatin (LIPITOR) tablet 80 mg  80 mg Oral Daily Clinton Gallant M, PA-C   80 mg at 05/11/22 1139   bisacodyl (DULCOLAX) suppository 10 mg  10 mg Rectal Daily PRN Clinton Gallant M, PA-C       carvedilol (COREG) tablet 6.25 mg  6.25 mg Oral BID WC Clinton Gallant M, PA-C   6.25 mg at 05/11/22 4401   Gerhardt's butt cream   Topical PRN  Clinton Gallant M, PA-C       hydrALAZINE (APRESOLINE) tablet 25 mg  25 mg Oral BID Rodolph Bong, MD   25 mg at 05/11/22 1139   metoprolol tartrate (LOPRESSOR) injection 2.5-5 mg  2.5-5 mg Intravenous Q2H PRN Clinton Gallant M, PA-C   5 mg at 05/01/22 0451   morphine (PF) 2 MG/ML injection 4 mg  4 mg Intravenous Q2H PRN Clinton Gallant M, PA-C   4 mg at 04/18/22 0505   nicotine (NICODERM CQ - dosed in mg/24 hours) patch 21 mg  21 mg Transdermal Daily Clinton Gallant M, PA-C   21 mg at 05/11/22 1139   ondansetron (ZOFRAN) injection 4 mg  4 mg Intravenous Q6H PRN Lars Mage, PA-C       Oral care mouth rinse  15 mL Mouth Rinse PRN Clinton Gallant M, PA-C       oxyCODONE-acetaminophen (PERCOCET/ROXICET) 5-325 MG per tablet 1-2 tablet  1-2 tablet Oral Q4H PRN Lars Mage, PA-C   2 tablet at 05/11/22 0272   pantoprazole (PROTONIX) EC tablet 40 mg  40 mg Oral Daily Clinton Gallant M, PA-C   40 mg at 05/11/22 1139   phenol (CHLORASEPTIC) mouth spray 1 spray  1 spray Mouth/Throat PRN Clinton Gallant M, PA-C       polyethylene glycol (MIRALAX / GLYCOLAX) packet 17 g  17 g Oral Daily PRN Lars Mage, PA-C   17 g at 04/19/22 1615   sodium chloride flush (NS) 0.9 % injection 3 mL  3 mL Intravenous Q12H Clinton Gallant M, PA-C   3 mL at 05/11/22 1139   sodium chloride flush (NS) 0.9 % injection 3 mL  3 mL Intravenous PRN Lars Mage, PA-C         Discharge Medications: Please see discharge summary for a list of discharge medications.  Relevant Imaging Results:  Relevant Lab Results:   Additional Information SSN# [id number]  Eduard Roux, LCSW

## 2022-05-12 LAB — CBC
HCT: 26.5 % — ABNORMAL LOW (ref 39.0–52.0)
Hemoglobin: 8.7 g/dL — ABNORMAL LOW (ref 13.0–17.0)
MCH: 29.3 pg (ref 26.0–34.0)
MCHC: 32.8 g/dL (ref 30.0–36.0)
MCV: 89.2 fL (ref 80.0–100.0)
Platelets: 283 10*3/uL (ref 150–400)
RBC: 2.97 MIL/uL — ABNORMAL LOW (ref 4.22–5.81)
RDW: 16.7 % — ABNORMAL HIGH (ref 11.5–15.5)
WBC: 10.1 10*3/uL (ref 4.0–10.5)
nRBC: 0 % (ref 0.0–0.2)

## 2022-05-12 LAB — COMPREHENSIVE METABOLIC PANEL
ALT: 26 U/L (ref 0–44)
AST: 21 U/L (ref 15–41)
Albumin: 2.6 g/dL — ABNORMAL LOW (ref 3.5–5.0)
Alkaline Phosphatase: 105 U/L (ref 38–126)
Anion gap: 9 (ref 5–15)
BUN: 17 mg/dL (ref 6–20)
CO2: 25 mmol/L (ref 22–32)
Calcium: 8.6 mg/dL — ABNORMAL LOW (ref 8.9–10.3)
Chloride: 103 mmol/L (ref 98–111)
Creatinine, Ser: 1.35 mg/dL — ABNORMAL HIGH (ref 0.61–1.24)
GFR, Estimated: >60 mL/min (ref >60)
Glucose, Bld: 111 mg/dL — ABNORMAL HIGH (ref 70–99)
Potassium: 4.1 mmol/L (ref 3.5–5.1)
Sodium: 137 mmol/L (ref 135–145)
Total Bilirubin: 0.5 mg/dL (ref 0.3–1.2)
Total Protein: 6.7 g/dL (ref 6.5–8.1)

## 2022-05-12 NOTE — Consult Note (Addendum)
WOC Nurse wound follow up Patient receiving care in [facility] 4E12 Possible DC to SNF when placement is found. Dr. Chestine Spore present for dressing removal.  Wound type: Surgical Wound bed: Healthy granulation tissue with sartorius muscle (per Dr. Chestine Spore) visible in the center of the wound Drainage (amount, consistency, odor) serous in canister Periwound: intact Dressing procedure/placement/frequency: One piece of black foam removed. One barrier ring applied at the crease of the groin. One piece of black foam replaced. Drape applied, immediate suction obtained at 75 mmHg.  WOC following MWF  Chip Boer L. Katrinka Blazing, MSN, RN, CMSRN, Angus Seller, [facility] Wound Treatment Associate Pager [phone]

## 2022-05-12 NOTE — Progress Notes (Signed)
Physical Therapy Treatment Patient Details Name: Jeffrey Fitzgerald MRN: [id number] DOB: 1971-01-02 Today's Date: 05/12/2022   History of Present Illness Pt is a 51 yo male admitted 04/01/22 from cath lab after aortogram for LLE ischemia. S/p aortobifemoral bypass graft with bilateral femoral endarterectomy on 8/9. Post op RLE ischemia with return to OR same date for redo R groin exploration with thrombectomy and angioplasty. S/p RLE fem-pop bypass graft with 4 compartment fasciotomy on 8/10. S/p thrombectomy left femoral to popliteal BPG wtih closure and right fasciotomy wtih VAC on right LE on 8/11. Ileus with emesis on 8/14. Complicated hospital course. 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 PMH includes HTN, tobacco smoking, PVD.    PT Comments    Pt agreeable to PT session with focus on therex for increased dynamic sitting balance and improved postural reactions as well as LE strength. Pt with good participation and engagement throughout session, with fair dynamic balance with pt able to self correct with all LOB with reaching strategies. Pt able to maintain balance intermittently with reaching across body with increased difficulty reaching outside BOS or with posterior leans.  Pt with noted strength imbalance, with pt demonstrating decreased LUE strength compared with right UE. Pt continues to benefit from skilled PT services to progress toward functional mobility goals.    Recommendations for follow up therapy are one component of a multi-disciplinary discharge planning process, led by the attending physician.  Recommendations may be updated based on patient status, additional functional criteria and insurance authorization.  Follow Up Recommendations  Skilled nursing-short term rehab (<3 hours/day) (pt declines, will go home) Can patient physically be transported by private vehicle: No   Assistance Recommended at Discharge Intermittent Supervision/Assistance  Patient can return home with the  following A lot of help with walking and/or transfers;A lot of help with bathing/dressing/bathroom;Assistance with cooking/housework;Direct supervision/assist for medications management;Direct supervision/assist for financial management;Assist for transportation;Help with stairs or ramp for entrance   Equipment Recommendations  Wheelchair (measurements PT);Wheelchair cushion (measurements PT);Hospital bed    Recommendations for Other Services       Precautions / Restrictions Precautions Precautions: Fall;Other (comment) Precaution Comments: R groin wound vac Restrictions Weight Bearing Restrictions: Yes RLE Weight Bearing: Non weight bearing LLE Weight Bearing: Non weight bearing     Mobility  Bed Mobility Overal bed mobility: Needs Assistance Bed Mobility: Supine to Sit     Supine to sit: Mod assist, HOB elevated     General bed mobility comments: mod I for sitting EOB, light assist to slide pad to EOB    Transfers                        Ambulation/Gait                   Stairs             Wheelchair Mobility    Modified Rankin (Stroke Patients Only)       Balance Overall balance assessment: Needs assistance Sitting-balance support: No upper extremity supported Sitting balance-Leahy Scale: Fair Sitting balance - Comments: inconsistent sitting balance EOB, at times able to maintain without UE support but unable to accept challenge                                    Cognition Arousal/Alertness: Awake/alert Behavior During Therapy: Flat affect Overall Cognitive Status: No family/caregiver present to determine  baseline cognitive functioning Area of Impairment: Attention, Following commands, Safety/judgement, Awareness, Problem solving                     Memory: Decreased short-term memory Following Commands: Follows one step commands consistently, Follows one step commands with increased time Safety/Judgement:  Decreased awareness of safety, Decreased awareness of deficits   Problem Solving: Requires verbal cues General Comments: Pt needs increased time and mod to max instructional/demonstrational cueing to initiate tasks.        Exercises Amputee Exercises Hip Extension: AROM, Both, 10 reps Hip Flexion/Marching: AROM, Both, 10 reps Other Exercises Other Exercises: various sitting dynamic balace exercises, reaching across body and outside BOS, pillow punches across body, side reaching outside BOS, modified sit ups    General Comments        Pertinent Vitals/Pain Pain Assessment Pain Assessment: No/denies pain Pain Intervention(s): Monitored during session    Home Living                          Prior Function            PT Goals (current goals can now be found in the care plan section) Acute Rehab PT Goals Patient Stated Goal: to go home PT Goal Formulation: With patient Time For Goal Achievement: 05/20/22    Frequency    Min 3X/week      PT Plan      Co-evaluation              AM-PAC PT "6 Clicks" Mobility   Outcome Measure  Help needed turning from your back to your side while in a flat bed without using bedrails?: A Little Help needed moving from lying on your back to sitting on the side of a flat bed without using bedrails?: A Lot Help needed moving to and from a bed to a chair (including a wheelchair)?: Total Help needed standing up from a chair using your arms (e.g., wheelchair or bedside chair)?: Total Help needed to walk in hospital room?: Total Help needed climbing 3-5 steps with a railing? : Total 6 Click Score: 9    End of Session   Activity Tolerance: Patient tolerated treatment well Patient left: in bed;with call bell/phone within reach;with bed alarm set Nurse Communication: Mobility status PT Visit Diagnosis: Other abnormalities of gait and mobility (R26.89);Difficulty in walking, not elsewhere classified (R26.2);Muscle weakness  (generalized) (M62.81)     Time: 2229-7989 PT Time Calculation (min) (ACUTE ONLY): 14 min  Charges:  $Therapeutic Exercise: 8-22 mins                     Amany Rando R. PTA Acute Rehabilitation Services Office: [phone]    Catalina Antigua 05/12/2022, 5:11 PM

## 2022-05-12 NOTE — Progress Notes (Addendum)
  Progress Note    05/12/2022 8:42 AM 12 Days Post-Op  Subjective:  says he just wants to know when he can go home. Working with OT when I entered the room   Vitals:   05/12/22 0352 05/12/22 0815  BP: 133/85 (!) 142/96  Pulse: 100 (!) 101  Resp: 14 15  Temp: 98 F (36.7 C) 97.9 F (36.6 C)  SpO2: 100% 99%   Physical Exam: Cardiac: tachy Lungs:  non labored Incisions: AKAs intact with mild bloody oozing from medial aspect of right AKA otherwise viable  Extremities: Right groin with VAC to suction Neurologic: alert and oriented  CBC    Component Value Date/Time   WBC 10.1 05/12/2022 0419   RBC 2.97 (L) 05/12/2022 0419   HGB 8.7 (L) 05/12/2022 0419   HCT 26.5 (L) 05/12/2022 0419   PLT 283 05/12/2022 0419   MCV 89.2 05/12/2022 0419   MCH 29.3 05/12/2022 0419   MCHC 32.8 05/12/2022 0419   RDW 16.7 (H) 05/12/2022 0419   LYMPHSABS 1.8 05/01/2022 0202   MONOABS 1.2 (H) 05/01/2022 0202   EOSABS 0.0 05/01/2022 0202   BASOSABS 0.0 05/01/2022 0202    BMET    Component Value Date/Time   NA 137 05/12/2022 0419   K 4.1 05/12/2022 0419   CL 103 05/12/2022 0419   CO2 25 05/12/2022 0419   GLUCOSE 111 (H) 05/12/2022 0419   BUN 17 05/12/2022 0419   CREATININE 1.35 (H) 05/12/2022 0419   CALCIUM 8.6 (L) 05/12/2022 0419   GFRNONAA >60 05/12/2022 0419   GFRAA >60 01/12/2020 1421    INR    Component Value Date/Time   INR 1.2 04/11/2022 0437     Intake/Output Summary (Last 24 hours) at 05/12/2022 0842 Last data filed at 05/12/2022 0355 Gross per 24 hour  Intake 100 ml  Output 850 ml  Net -750 ml     Assessment/Plan:  51 y.o. male is s/p Aortobifemoral bypass with bilateral AKA's 12 Days Post-Op   AKAs well appearing, mild bleeding from medial right AKA stump Continue dry dressings as needed Placed order for stump shrinker Right groin VAC to suction. VAC change with WOC RN today Now agreeable to SNF Pending acceptance to SNF Appreciate CM assistance with  placement  Dory Horn [facility] [phone] 05/12/2022 8:42 AM  I have seen and evaluated the patient. I agree with the PA note as documented above.  Status post aortobifem with bilateral fem-tib bypasses ultimately required bilateral AKA's.  Again bilateral AKA's are healing.  Midline and left groin incision are healing.  VAC changed to the right groin today with healthy appearing sartorius muscle flap and I was present for vac change.  Dispo has been an issue given he wants to go home and has no insurance.  States today he is amendable to a SNF.  We will pass this along to care management.  Cephus Shelling, MD [facility] [place] Office: [phone]

## 2022-05-12 NOTE — Plan of Care (Signed)
  Problem: Education: Goal: Knowledge of General Education information will improve Description Including pain rating scale, medication(s)/side effects and non-pharmacologic comfort measures Outcome: Progressing   Problem: Clinical Measurements: Goal: Will remain free from infection Outcome: Progressing Goal: Respiratory complications will improve Outcome: Progressing Goal: Cardiovascular complication will be avoided Outcome: Progressing   

## 2022-05-12 NOTE — Progress Notes (Signed)
PROGRESS NOTE/consult    Jeffrey Fitzgerald  DXI:[id number] DOB: [birth year] DOA: 04/01/2022 PCP: Patient, No Pcp Per    No chief complaint on file.   Brief Narrative:   Jeffrey Fitzgerald is a 51 y.o. male with past medical history of hypertension, peripheral vascular disease, and tobacco abuse who presented initially for critical left lower limb ischemia 8/3 requiring aortobifemoral bypass.  He then underwent left common femoral and posterior tibial bypass on 8/9 same-day for right common revision of the right lower extremity aortobifemoral bypass.  He returned to the OR on 8/11 and underwent thrombectomy of left common femoral to posterior tibial artery bypass, right lower extremity fasciotomy closure, and wound VAC placement.  White blood cell count had trended up to 24.8 and the patient has been temporarily treated empirically with Zosyn.  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  His hospital stay had been complicated by hemorrhagic bleed with hemoglobin trending down to 6 g/dL requiring 2 units of packed red blood cells and creatinine trended up to 3.59 before trending down.    He has been complaining of pain out of left leg which was noted to be cold and contracted.  Vascular had discussed need of left above-knee amputation, but he was not willing to undergo amputation of the left leg.  Palliative care had been consulted.  Patient was noted to become more tachycardic and hypertensive with blood pressures elevated into the 200s at times.  He denies any complaints of any chest pain, nausea, vomiting, or diarrhea.  Patient's white blood cell counts have been otherwise trending down.  TRH consulted to help with medical management.   Assessment & Plan:   Principal Problem:   Critical limb ischemia of  both lower extremities (HCC) Active Problems:   PAD (peripheral artery disease) (HCC)   Acute renal failure (HCC)   Other specified anemias   Pressure injury of skin   SIRS (systemic inflammatory response syndrome) (HCC)   Hypertensive urgency   Hypokalemia   Gastroesophageal reflux disease   Acute postoperative anemia due to expected blood loss  #1 critical limb ischemia -Patient presented with critical limb ischemia bilateral lower extremities required several attempts at revascularization. -Patient subsequently underwent right AKA on 04/22/2022. -Vascular surgery/primary team recommended left AKA however patient initially declined. -Palliative care consulted and following  and spoke with patient and family and patient decided to have left AKA which was done on 04/30/2022 per vascular surgery. -Continue aspirin, statin. -Pain management per primary team. -Continue right lower extremity wound VAC per vascular surgery. -TOC assisting with dispo planning, patient noted to have declined SNF, no insurance for home health with VAC changes. -Per vascular surgery today patient amenable to SNF. -Per vascular surgery.  2.  SIRS -Patient noted to be tachycardic, tachypneic with a leukocytosis white count of 18.5 with noted to be trending down. -Prior blood cultures done with no growth to date. -Patient noted to have been on Zosyn which has subsequently been discontinued. -UA noted moderate hemoglobin, small leukocytes, rare bacteria, 6-10 WBCs.  Patient with no urinary symptoms. -Right lower extremity wound appears to be healing well. -Patient with no respiratory symptoms. -Leukocytosis trended down and have normalized. -Blood cultures with no growth to date x5 days. -Follow.  3.  Hypertensive urgency -Patient noted to have blood pressure elevated 198/86  with heart rates in the 120s. -Likely a component of pain contributing to hypertensive urgency. -Patient noted to not be on any  antihypertensive medications prior to admission. -Patient noted to have been on hydralazine 10 mg twice daily however did not obtain adequate blood pressure control. -Continue Coreg 6.25 mg twice daily, Norvasc 10 mg daily, hydralazine 25 mg twice daily. -Uptitrate antihypertensive medications for better blood pressure control if needed.  4.  Hypokalemia -Repleted.   -Potassium of 4.1.  5.  Acute renal failure -Baseline creatinine approximately 1-1.2. -Renal function noted to trend up to 3.59 on 04/11/2022 and felt likely secondary to contrast given. -Renal function improving with creatinine trending down with hydration currently at 1.35. -Urine output of 1.7 L recorded over the past 24 hours.  -Follow.  6.  Urinary frequency -Patient noted to have urinary frequency without dysuria. -Urine cultures ordered but have seem to be canceled. -Currently asymptomatic.  7.  Tobacco abuse -Tobacco cessation. -Nicotine patch.  8.  GERD -PPI.  9.  Postop acute blood loss anemia -Hemoglobin stable at 8.7. -Follow H&H.   -Transfusion threshold hemoglobin < 7.   DVT prophylaxis: Per primary team Code Status: Full Family Communication: Updated patient.  No family at bedside. Disposition: Per primary team.  Status is: Inpatient Remains inpatient appropriate because: Severity of illness   Consultants:  Cardiology: Dr. Percival Spanish 04/01/2022 Wound care RN, Val Riles 04/09/2022 Palliative care: Adam Phenix, NP 04/21/2022 [facility]ists: Dr. Tamala Julian 04/27/2022  Procedures:  Abdominal films 04/07/2022, 04/12/2022 Chest x-ray 04/07/2022, 04/18/2022 Myoview stress test 04/02/2022 2D echo 04/02/2022 Right AKA per vascular surgery: Dr. Trula Slade 04/22/2022 Reexploration and washout of right groin wound less than 30 days postop, sharp excisional debridement of right groin wound, repair of aortobifemoral to profunda femoris artery bypass with pledgeted suture, right groin sartorius muscle flap coverage,  application negative pressure dressing per vascular surgery: Dr. Donzetta Matters 04/20/2022 Right leg fasciotomy washout, soft tissue debridement, right groin washout, soft tissue debridement, VAC placement per vascular surgery: Dr. Unk Lightning 04/20/2022 Thrombectomy of left common femoral to posterior tibial artery bypass, closure of right medial calf fasciotomy, application of negative pressure wound VAC greater right lateral calf fasciotomy per vascular surgery: Dr. Carlis Abbott 04/09/2022 Right greater saphenous vein harvest, right common femoral to posterior tibial artery bypass with 6 mm PTFE/greater saphenous vein composite graft, right lower extremity angiogram, right lower extremity 4 compartment fasciotomy, right posterior tibial artery thrombectomy per Dr. Stanford Breed, Dr. Carlis Abbott vascular surgery 04/08/2022 Redo exploration of the right groin/right common femoral artery thrombectomy/extensive endarterectomy of right profunda with bovine pericardial patch angioplasty, revision of right limb of the aortobifemoral bypass graft 04/07/2022 per Dr. Donzetta Matters. Aortobifemoral bypass, right common femoral endarterectomy with profundoplasty, left common femoral endarterectomy with profundoplasty, harvest of left leg great saphenous vein, left common femoral to posterior tibial bypass with composite PTFE and great saphenous vein per vascular surgery: Dr. Carlis Abbott 04/07/2022 Aortobifemoral bypass, bilateral common femoral and profunda femoris endarterectomies, harvest left greater saphenous vein, left common femoral to posterior tibial artery bypass with composite 6 mm E PTFE in reverse greater saphenous vein and anatomic tunnel by Dr. Carlis Abbott at [place] vascular surgery 04/07/2022 Attempted ultrasound-guided access left common femoral artery, ultrasound-guided access right common femoral artery, aortogram with catheter selection of the aorta, bilateral lower extremity arteriogram with runoff, 32 minutes monitored moderate conscious sedation time Per  vascular surgery: Dr. Carlis Abbott 04/01/2022 Left AKA per vascular surgery: Dr. Donzetta Matters 04/30/2022  Antimicrobials:  Anti-infectives (From admission, onward)    Start  Dose/Rate Route Frequency Ordered Stop   04/21/22 1315  vancomycin (VANCOCIN) IVPB 1000 mg/200 mL premix  Status:  Discontinued        1,000 mg 200 mL/hr over 60 Minutes Intravenous Every 24 hours 04/21/22 0933 04/27/22 1020   04/19/22 1315  vancomycin (VANCOREADY) IVPB 750 mg/150 mL  Status:  Discontinued        750 mg 150 mL/hr over 60 Minutes Intravenous Every 24 hours 04/19/22 1218 04/21/22 0933   04/18/22 1000  piperacillin-tazobactam (ZOSYN) IVPB 3.375 g  Status:  Discontinued        3.375 g 12.5 mL/hr over 240 Minutes Intravenous Every 8 hours 04/18/22 0925 04/27/22 1020   04/08/22 2200  linezolid (ZYVOX) IVPB 600 mg        600 mg 300 mL/hr over 60 Minutes Intravenous Every 12 hours 04/08/22 1622 04/15/22 2359   04/08/22 0000  ceFAZolin (ANCEF) IVPB 2g/100 mL premix        2 g 200 mL/hr over 30 Minutes Intravenous Every 8 hours 04/07/22 2316 04/08/22 1559   04/07/22 1529  ceFAZolin (ANCEF) IVPB 2g/100 mL premix  Status:  Discontinued        2 g 200 mL/hr over 30 Minutes Intravenous 30 min pre-op 04/07/22 1530 04/07/22 2005   04/06/22 0932  vancomycin (VANCOREADY) IVPB 750 mg/150 mL  Status:  Discontinued        750 mg 150 mL/hr over 60 Minutes Intravenous Every 12 hours 04/06/22 0903 04/08/22 1634   04/05/22 2200  vancomycin (VANCOREADY) IVPB 1250 mg/250 mL  Status:  Discontinued        1,250 mg 166.7 mL/hr over 90 Minutes Intravenous Every 48 hours 04/04/22 1728 04/05/22 1023   04/05/22 1800  vancomycin (VANCOREADY) IVPB 1250 mg/250 mL  Status:  Discontinued        1,250 mg 166.7 mL/hr over 90 Minutes Intravenous Every 24 hours 04/05/22 1023 04/06/22 0903   04/02/22 2200  vancomycin (VANCOREADY) IVPB 750 mg/150 mL  Status:  Discontinued        750 mg 150 mL/hr over 60 Minutes Intravenous Every 12 hours 04/02/22 0853  04/04/22 1728   04/02/22 1000  piperacillin-tazobactam (ZOSYN) IVPB 3.375 g        3.375 g 12.5 mL/hr over 240 Minutes Intravenous Every 8 hours 04/02/22 0828 04/15/22 2359   04/02/22 1000  vancomycin (VANCOREADY) IVPB 1500 mg/300 mL        1,500 mg 150 mL/hr over 120 Minutes Intravenous  Once 04/02/22 0828 04/02/22 1141         Subjective: Laying in bed.  No chest pain.  No shortness of breath.  No abdominal pain.  Objective: Vitals:   05/12/22 0022 05/12/22 0352 05/12/22 0815 05/12/22 1157  BP: 124/75 133/85 (!) 142/96 114/70  Pulse: (!) 108 100 (!) 101 (!) 103  Resp: 11 14 15 14   Temp: 98.1 F (36.7 C) 98 F (36.7 C) 97.9 F (36.6 C) 97.9 F (36.6 C)  TempSrc: Oral Oral Oral Oral  SpO2: 100% 100% 99% 100%  Weight:      Height:        Intake/Output Summary (Last 24 hours) at 05/12/2022 1302 Last data filed at 05/12/2022 0355 Gross per 24 hour  Intake 100 ml  Output 850 ml  Net -750 ml    Filed Weights   04/09/22 0830 04/22/22 0831 04/30/22 0825  Weight: 68.8 kg 65.8 kg 65.8 kg    Examination:  General exam: NAD Respiratory system: CTA B.  No wheezes, no crackles, no rhonchi.  Fair air movement.  Speaking in full sentences.  Cardiovascular system: RRR no murmurs rubs or gallops.  No JVD.   Gastrointestinal system: Abdomen is soft, nontender, nondistended, positive bowel sounds.  No rebound.  No guarding.  Central nervous system: Alert and oriented. No focal neurological deficits. Extremities: Status post right AKA with incision site c/d/i.  Status post left AKA with incision site c/d/I, with staples intact.  Skin: No rashes, lesions or ulcers Psychiatry: Judgement and insight normal.  Mood and affect appropriate.    Data Reviewed: I have personally reviewed following labs and imaging studies  CBC: Recent Labs  Lab 05/08/22 0219 05/09/22 0356 05/10/22 0301 05/11/22 0402 05/12/22 0419  WBC 11.3* 9.9 9.4 9.5 10.1  HGB 8.2* 8.6* 9.4* 9.0* 8.7*  HCT  25.0* 26.6* 29.6* 27.5* 26.5*  MCV 89.0 89.9 90.2 88.7 89.2  PLT 293 305 330 303 283     Basic Metabolic Panel: Recent Labs  Lab 05/08/22 0219 05/09/22 0356 05/10/22 0301 05/11/22 0402 05/12/22 0419  NA 138 138 137 137 137  K 4.3 4.2 4.2 4.2 4.1  CL 104 104 103 105 103  CO2 25 25 28 27 25   GLUCOSE 113* 110* 111* 111* 111*  BUN 18 19 20  21* 17  CREATININE 1.45* 1.39* 1.44* 1.32* 1.35*  CALCIUM 8.7* 8.9 8.9 8.7* 8.6*     GFR: Estimated Creatinine Clearance: 56.9 mL/min (A) (by C-G formula based on SCr of 1.35 mg/dL (H)).  Liver Function Tests: Recent Labs  Lab 05/08/22 0219 05/09/22 0356 05/10/22 0301 05/11/22 0402 05/12/22 0419  AST 28 32 28 26 21   ALT 37 35 32 31 26  ALKPHOS 97 102 106 102 105  BILITOT 0.6 0.7 0.9 0.7 0.5  PROT 6.5 6.9 7.1 6.9 6.7  ALBUMIN 2.3* 2.5* 2.6* 2.5* 2.6*    CBG: Recent Labs  Lab 05/06/22 0747 05/06/22 1210 05/06/22 1553  GLUCAP 119* 113* 132*      No results found for this or any previous visit (from the past 240 hour(s)).       Radiology Studies: No results found.      Scheduled Meds:  amLODipine  10 mg Oral Daily   aspirin EC  81 mg Oral Daily   atorvastatin  80 mg Oral Daily   carvedilol  6.25 mg Oral BID WC   hydrALAZINE  25 mg Oral BID   nicotine  21 mg Transdermal Daily   pantoprazole  40 mg Oral Daily   sodium chloride flush  3 mL Intravenous Q12H   Continuous Infusions:  sodium chloride 180 mL/hr at 04/20/22 0944   sodium chloride 10 mL/hr at 04/25/22 1616     LOS: 41 days    Time spent: 35 minutes    07/06/22, MD [facility]   To contact the attending provider between 7A-7P or the covering provider during after hours 7P-7A, please log into the web site [email or web address] and access using universal  password for that web site. If you do not have the password, please call the hospital operator.  05/12/2022, 1:02 PM

## 2022-05-12 NOTE — TOC Progression Note (Signed)
Transition of Care [facility]) - Progression Note    Patient Details  Name: Jeffrey Fitzgerald MRN: [id number] Date of Birth: Jan 02, 1971  [facility]) CM/SW Contact  Eduard Roux, [place] Phone Number: 05/12/2022, 2:34 PM  Clinical Narrative:     [facility] - left voice message to return call.  Expected Discharge Plan: Skilled Nursing Facility Barriers to Discharge: Inadequate or no insurance, SNF Pending bed offer  Expected Discharge Plan and Services Expected Discharge Plan: Skilled Nursing Facility In-house Referral: Clinical Social Work Discharge Planning Services: CM Consult   Living arrangements for the past 2 months: Single Family Home                 DME Arranged: Hospital bed, Other see comment, Wheelchair manual DME Agency: [employer]                   Social Determinants of Health (SDOH) Interventions    Readmission Risk Interventions     No data to display

## 2022-05-12 NOTE — Progress Notes (Signed)
Orthopedic Tech Progress Note Patient Details:  Jeffrey Fitzgerald 05/04/1971 [id number]  Called in order to [facility] for BLE AKA SHRINKERS   Patient ID: Jeffrey Fitzgerald, male   DOB: February 26, 1971, 51 y.o.   MRN: [id number]  [name] 05/12/2022, 8:52 AM

## 2022-05-12 NOTE — Progress Notes (Signed)
Occupational Therapy Treatment Patient Details Name: Jeffrey Fitzgerald MRN: [id number] DOB: 07-14-1971 Today's Date: 05/12/2022   History of present illness Pt is a 51 yo male admitted 04/01/22 from cath lab after aortogram for LLE ischemia. S/p aortobifemoral bypass graft with bilateral femoral endarterectomy on 8/9. Post op RLE ischemia with return to OR same date for redo R groin exploration with thrombectomy and angioplasty. S/p RLE fem-pop bypass graft with 4 compartment fasciotomy on 8/10. S/p thrombectomy left femoral to popliteal BPG wtih closure and right fasciotomy wtih VAC on right LE on 8/11. Ileus with emesis on 8/14. Complicated hospital course. 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 PMH includes HTN, tobacco smoking, PVD.   OT comments    Pt currently min to mod assist for supine to sit and for posterior transfer to the recliner.  He was able to participate in BUE therex exercise with mod demonstrational cueing for technique with handout provided.  Still feel he will benefit from acute care OT to work on strengthening and ADL independence, however still recommend post acute SNF for longer rehab.     Recommendations for follow up therapy are one component of a multi-disciplinary discharge planning process, led by the attending physician.  Recommendations may be updated based on patient status, additional functional criteria and insurance authorization.    Follow Up Recommendations  Skilled nursing-short term rehab (<3 hours/day)    Assistance Recommended at Discharge Frequent or constant Supervision/Assistance  Patient can return home with the following  A lot of help with bathing/dressing/bathroom;Direct supervision/assist for medications management;Assist for transportation;Help with stairs or ramp for entrance;A lot of help with walking and/or transfers;Assistance with cooking/housework   Equipment Recommendations  Other (comment) (TBD next venue of care)       Precautions /  Restrictions Precautions Precautions: Fall;Other (comment) Precaution Comments: R groin wound vac Restrictions Weight Bearing Restrictions: Yes RLE Weight Bearing: Non weight bearing LLE Weight Bearing: Non weight bearing       Mobility Bed Mobility Overal bed mobility: Needs Assistance Bed Mobility: Supine to Sit     Supine to sit: Mod assist, HOB elevated     General bed mobility comments: Pt needs assist with lifting trunk up to sitting and will also use bedrails to assist    Transfers Overall transfer level: Needs assistance Equipment used: None Transfers: Bed to chair/wheelchair/BSC         Anterior-Posterior transfers: Min assist (posterior transfer into the recliner)   General transfer comment: Pt needs increased time and mod demonstrational cueing for sequencing posterior transfer into the recliner.     Balance Overall balance assessment: Needs assistance Sitting-balance support: Bilateral upper extremity supported Sitting balance-Leahy Scale: Poor Sitting balance - Comments: Pt with LOB when sitting up in bed without UE support.  Needs use of bed rails at time to regain balance.                                   ADL either performed or assessed with clinical judgement   ADL                           Toilet Transfer: Minimal assistance Toilet Transfer Details (indicate cue type and reason): posterior transfer into the recliner, pt declining need to toilet           General ADL Comments: Pt worked on BUE strengthening exercises from  the recliner.  See exercise section for details.               Cognition Arousal/Alertness: Awake/alert Behavior During Therapy: Flat affect Overall Cognitive Status: No family/caregiver present to determine baseline cognitive functioning Area of Impairment: Following commands, Awareness, Attention                   Current Attention Level: Sustained Memory: Decreased short-term  memory Following Commands: Follows one step commands with increased time, Follows multi-step commands with increased time Safety/Judgement: Decreased awareness of safety Awareness: Emergent Problem Solving: Slow processing General Comments: Mod demonstrational cueing for sequencing supine to sit and transfer posteriorly into the recliner.        Exercises General Exercises - Upper Extremity Shoulder Horizontal ABduction: Strengthening, Seated, Theraband, Both, 10 reps Theraband Level (Shoulder Horizontal Abduction): Level 2 (Red) Shoulder Exercises Shoulder Flexion: Strengthening, Theraband, Both, Seated, 10 reps Theraband Level (Shoulder Flexion): Level 2 (Red) Elbow Flexion: Strengthening, Both, 10 reps, Theraband, Seated Theraband Level (Elbow Flexion): Level 2 (Red) Elbow Extension: Strengthening, 10 reps, Theraband, Seated Theraband Level (Elbow Extension): Level 2 (Red)            Pertinent Vitals/ Pain       Pain Assessment Pain Assessment: No/denies pain         Frequency  Min 2X/week        Progress Toward Goals  OT Goals(current goals can now be found in the care plan section)  Progress towards OT goals: Progressing toward goals  Acute Rehab OT Goals Patient Stated Goal: Pt agreed to go to rehab at [facility] post acute.  Agreeable to getting OOB for this session. OT Goal Formulation: With patient Time For Goal Achievement: 05/17/22 Potential to Achieve Goals: Fair  Plan Frequency remains appropriate;Discharge plan remains appropriate       AM-PAC OT "6 Clicks" Daily Activity     Outcome Measure   Help from another person eating meals?: None Help from another person taking care of personal grooming?: A Little Help from another person toileting, which includes using toliet, bedpan, or urinal?: A Lot Help from another person bathing (including washing, rinsing, drying)?: A Lot Help from another person to put on and taking off regular upper body clothing?:  None Help from another person to put on and taking off regular lower body clothing?: A Lot 6 Click Score: 17    End of Session    OT Visit Diagnosis: Unsteadiness on feet (R26.81);Other abnormalities of gait and mobility (R26.89);Muscle weakness (generalized) (M62.81);Other symptoms and signs involving cognitive function   Activity Tolerance Patient tolerated treatment well   Patient Left in chair;with call bell/phone within reach;with chair alarm set   Nurse Communication Mobility status;Other (comment) (anterior transfer if wanting to go back to bed)        Time: 0821-0902 OT Time Calculation (min): 41 min  Charges: OT General Charges $OT Visit: 1 Visit OT Treatments $Self Care/Home Management : 23-37 mins $Therapeutic Exercise: 8-22 mins  Kamilah Correia OTR/L 05/12/2022, 9:26 AM

## 2022-05-13 NOTE — Plan of Care (Signed)
  Problem: Education: Goal: Knowledge of General Education information will improve Description: Including pain rating scale, medication(s)/side effects and non-pharmacologic comfort measures Outcome: Progressing   Problem: Health Behavior/Discharge Planning: Goal: Ability to manage health-related needs will improve Outcome: Progressing   Problem: Clinical Measurements: Goal: Will remain free from infection Outcome: Progressing Goal: Diagnostic test results will improve Outcome: Progressing Goal: Cardiovascular complication will be avoided Outcome: Progressing   Problem: Elimination: Goal: Will not experience complications related to urinary retention Outcome: Progressing

## 2022-05-13 NOTE — Progress Notes (Signed)
PROGRESS NOTE/consult    Jeffrey Fitzgerald  JSH:[id number] DOB: 1970/11/23 DOA: 04/01/2022 PCP: Patient, No Pcp Per    No chief complaint on file.   Brief Narrative:   Jeffrey Fitzgerald is a 51 y.o. male with past medical history of hypertension, peripheral vascular disease, and tobacco abuse who presented initially for critical left lower limb ischemia 8/3 requiring aortobifemoral bypass.  He then underwent left common femoral and posterior tibial bypass on 8/9 same-day for right common revision of the right lower extremity aortobifemoral bypass.  He returned to the OR on 8/11 and underwent thrombectomy of left common femoral to posterior tibial artery bypass, right lower extremity fasciotomy closure, and wound VAC placement.  White blood cell count had trended up to 24.8 and the patient has been temporarily treated empirically with Zosyn.  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  His hospital stay had been complicated by hemorrhagic bleed with hemoglobin trending down to 6 g/dL requiring 2 units of packed red blood cells and creatinine trended up to 3.59 before trending down.    He has been complaining of pain out of left leg which was noted to be cold and contracted.  Vascular had discussed need of left above-knee amputation, but he was not willing to undergo amputation of the left leg.  Palliative care had been consulted.  Patient was noted to become more tachycardic and hypertensive with blood pressures elevated into the 200s at times.  He denies any complaints of any chest pain, nausea, vomiting, or diarrhea.  Patient's white blood cell counts have been otherwise trending down.  TRH consulted to help with medical management.   Assessment & Plan:   Principal Problem:   Critical limb ischemia of  both lower extremities (HCC) Active Problems:   PAD (peripheral artery disease) (HCC)   Acute renal failure (HCC)   Other specified anemias   Pressure injury of skin   SIRS (systemic inflammatory response syndrome) (HCC)   Hypertensive urgency   Hypokalemia   Gastroesophageal reflux disease   Acute postoperative anemia due to expected blood loss  #1 critical limb ischemia -Patient presented with critical limb ischemia bilateral lower extremities required several attempts at revascularization. -Patient subsequently underwent right AKA on 04/22/2022. -Vascular surgery/primary team recommended left AKA however patient initially declined. -Palliative care consulted and following  and spoke with patient and family and patient decided to have left AKA which was done on 04/30/2022 per vascular surgery. -Continue aspirin, statin. -Pain management per primary team. -Continue right lower extremity wound VAC per vascular surgery. -TOC assisting with dispo planning, patient noted to have declined SNF, no insurance for home health with VAC changes. -Per vascular surgery, patient now amenable to SNF. -Per vascular surgery.  2.  SIRS -Patient noted to be tachycardic, tachypneic with a leukocytosis white count of 18.5 with noted to be trending down. -Prior blood cultures done with no growth to date. -Patient noted to have been on Zosyn which has subsequently been discontinued. -UA noted moderate hemoglobin, small leukocytes, rare bacteria, 6-10 WBCs.  Patient with no urinary symptoms. -Right lower extremity wound appears to be healing well. -Patient with no respiratory symptoms. -Leukocytosis trended down and have normalized. -Blood cultures with no growth to date x5 days. -Follow.  3.  Hypertensive urgency -Patient noted to have blood pressure elevated 198/86  with heart rates in the 120s. -Likely a component of pain contributing to hypertensive urgency. -Patient noted to not be on any  antihypertensive medications prior to admission. -Patient noted to have been on hydralazine 10 mg twice daily however did not obtain adequate blood pressure control. -Continue Coreg 6.25 mg twice daily, Norvasc 10 mg daily, hydralazine 25 mg twice daily. -Uptitrate antihypertensive medications for better blood pressure control if needed.  4.  Hypokalemia -Repleted.   -Potassium of 4.1.  5.  Acute renal failure -Baseline creatinine approximately 1-1.2. -Renal function noted to trend up to 3.59 on 04/11/2022 and felt likely secondary to contrast given. -Renal function improving with creatinine trending down with hydration currently at 1.35. -Urine output of 700 cc recorded over the past 24 hours.  -Follow.  6.  Urinary frequency -Patient noted to have urinary frequency without dysuria. -Urine cultures ordered but have seem to be canceled. -Currently asymptomatic. -No further work-up needed.  7.  Tobacco abuse -Tobacco cessation. -Nicotine patch.  8.  GERD -PPI.  9.  Postop acute blood loss anemia -Hemoglobin stable at 8.7. -Follow H&H.   -Transfusion threshold hemoglobin < 7.   DVT prophylaxis: Per primary team Code Status: Full Family Communication: Updated patient.  No family at bedside. Disposition: SNF when bed available.  Status is: Inpatient Remains inpatient appropriate because: Severity of illness   Consultants:  Cardiology: Dr. Antoine Poche 04/01/2022 Wound care RN, Jeffrey Fitzgerald 04/09/2022 Palliative care: Jeffrey Seen, NP 04/21/2022 [facility]: Dr. Katrinka Blazing 04/27/2022  Procedures:  Abdominal films 04/07/2022, 04/12/2022 Chest x-ray 04/07/2022, 04/18/2022 Myoview stress test 04/02/2022 2D echo 04/02/2022 Right AKA per vascular surgery: Dr. Myra Gianotti 04/22/2022 Reexploration and washout of right groin wound less than 30 days postop, sharp excisional debridement of right groin wound, repair of aortobifemoral to profunda femoris artery bypass with pledgeted suture, right  groin sartorius muscle flap coverage, application negative pressure dressing per vascular surgery: Dr. Randie Heinz 04/20/2022 Right leg fasciotomy washout, soft tissue debridement, right groin washout, soft tissue debridement, VAC placement per vascular surgery: Dr. Sherral Hammers 04/20/2022 Thrombectomy of left common femoral to posterior tibial artery bypass, closure of right medial calf fasciotomy, application of negative pressure wound VAC greater right lateral calf fasciotomy per vascular surgery: Dr. Chestine Spore 04/09/2022 Right greater saphenous vein harvest, right common femoral to posterior tibial artery bypass with 6 mm PTFE/greater saphenous vein composite graft, right lower extremity angiogram, right lower extremity 4 compartment fasciotomy, right posterior tibial artery thrombectomy per Dr. Lenell Antu, Dr. Chestine Spore vascular surgery 04/08/2022 Redo exploration of the right groin/right common femoral artery thrombectomy/extensive endarterectomy of right profunda with bovine pericardial patch angioplasty, revision of right limb of the aortobifemoral bypass graft 04/07/2022 per Dr. Randie Heinz. Aortobifemoral bypass, right common femoral endarterectomy with profundoplasty, left common femoral endarterectomy with profundoplasty, harvest of left leg great saphenous vein, left common femoral to posterior tibial bypass with composite PTFE and great saphenous vein per vascular surgery: Dr. Chestine Spore 04/07/2022 Aortobifemoral bypass, bilateral common femoral and profunda femoris endarterectomies, harvest left greater saphenous vein, left common femoral to posterior tibial artery bypass with composite 6 mm E PTFE in reverse greater saphenous vein and anatomic tunnel by Dr. Chestine Spore at [place] [facility] 04/07/2022 Attempted ultrasound-guided access left common femoral artery, ultrasound-guided access right common femoral artery, aortogram with catheter selection of the aorta, bilateral lower extremity arteriogram with runoff, 32 minutes monitored  moderate conscious sedation time Per vascular surgery: Dr. Chestine Spore 04/01/2022 Left AKA per vascular surgery: Dr. Randie Heinz 04/30/2022  Antimicrobials:  Anti-infectives (From admission, onward)  Start     Dose/Rate Route Frequency Ordered Stop   04/21/22 1315  vancomycin (VANCOCIN) IVPB 1000 mg/200 mL premix  Status:  Discontinued        1,000 mg 200 mL/hr over 60 Minutes Intravenous Every 24 hours 04/21/22 0933 04/27/22 1020   04/19/22 1315  vancomycin (VANCOREADY) IVPB 750 mg/150 mL  Status:  Discontinued        750 mg 150 mL/hr over 60 Minutes Intravenous Every 24 hours 04/19/22 1218 04/21/22 0933   04/18/22 1000  piperacillin-tazobactam (ZOSYN) IVPB 3.375 g  Status:  Discontinued        3.375 g 12.5 mL/hr over 240 Minutes Intravenous Every 8 hours 04/18/22 0925 04/27/22 1020   04/08/22 2200  linezolid (ZYVOX) IVPB 600 mg        600 mg 300 mL/hr over 60 Minutes Intravenous Every 12 hours 04/08/22 1622 04/15/22 2359   04/08/22 0000  ceFAZolin (ANCEF) IVPB 2g/100 mL premix        2 g 200 mL/hr over 30 Minutes Intravenous Every 8 hours 04/07/22 2316 04/08/22 1559   04/07/22 1529  ceFAZolin (ANCEF) IVPB 2g/100 mL premix  Status:  Discontinued        2 g 200 mL/hr over 30 Minutes Intravenous 30 min pre-op 04/07/22 1530 04/07/22 2005   04/06/22 0932  vancomycin (VANCOREADY) IVPB 750 mg/150 mL  Status:  Discontinued        750 mg 150 mL/hr over 60 Minutes Intravenous Every 12 hours 04/06/22 0903 04/08/22 1634   04/05/22 2200  vancomycin (VANCOREADY) IVPB 1250 mg/250 mL  Status:  Discontinued        1,250 mg 166.7 mL/hr over 90 Minutes Intravenous Every 48 hours 04/04/22 1728 04/05/22 1023   04/05/22 1800  vancomycin (VANCOREADY) IVPB 1250 mg/250 mL  Status:  Discontinued        1,250 mg 166.7 mL/hr over 90 Minutes Intravenous Every 24 hours 04/05/22 1023 04/06/22 0903   04/02/22 2200  vancomycin (VANCOREADY) IVPB 750 mg/150 mL  Status:  Discontinued        750 mg 150 mL/hr over 60 Minutes  Intravenous Every 12 hours 04/02/22 0853 04/04/22 1728   04/02/22 1000  piperacillin-tazobactam (ZOSYN) IVPB 3.375 g        3.375 g 12.5 mL/hr over 240 Minutes Intravenous Every 8 hours 04/02/22 0828 04/15/22 2359   04/02/22 1000  vancomycin (VANCOREADY) IVPB 1500 mg/300 mL        1,500 mg 150 mL/hr over 120 Minutes Intravenous  Once 04/02/22 0828 04/02/22 1141         Subjective: Laying in bed watching a show on his phone.  No chest pain.  No shortness of breath.  No abdominal pain.  Tolerating current diet.   Objective: Vitals:   05/12/22 1945 05/12/22 2315 05/13/22 0320 05/13/22 0841  BP: 97/69 (!) 135/91 138/71 128/69  Pulse: 94 68 96 (!) 107  Resp: 16 13 16 16   Temp: 98.8 F (37.1 C) 98.8 F (37.1 C) 98.1 F (36.7 C) 98.1 F (36.7 C)  TempSrc: Oral Oral Oral Oral  SpO2: 100% 100% 100% 100%  Weight:      Height:        Intake/Output Summary (Last 24 hours) at 05/13/2022 1230 Last data filed at 05/13/2022 0554 Gross per 24 hour  Intake 120 ml  Output 700 ml  Net -580 ml    Filed Weights   04/09/22 0830 04/22/22 0831 04/30/22 0825  Weight: 68.8 kg 65.8 kg 65.8  kg    Examination:  General exam: NAD Respiratory system: Lungs with auscultation bilaterally.  No wheezes, no crackles, no rhonchi.  Fair air movement.  Speaking in full sentences.  Cardiovascular system: Regular rate rhythm no murmurs rubs or gallops.  No JVD.   Gastrointestinal system: Abdomen is soft, nontender, nondistended, positive bowel sounds.  No rebound.  Central nervous system: Alert and oriented. No focal neurological deficits. Extremities: Status post right AKA with incision site c/d/i.  Status post left AKA with incision site c/d/I, with staples intact.  Skin: No rashes, lesions or ulcers Psychiatry: Judgement and insight normal.  Mood and affect appropriate.    Data Reviewed: I have personally reviewed following labs and imaging studies  CBC: Recent Labs  Lab 05/08/22 0219  05/09/22 0356 05/10/22 0301 05/11/22 0402 05/12/22 0419  WBC 11.3* 9.9 9.4 9.5 10.1  HGB 8.2* 8.6* 9.4* 9.0* 8.7*  HCT 25.0* 26.6* 29.6* 27.5* 26.5*  MCV 89.0 89.9 90.2 88.7 89.2  PLT 293 305 330 303 283     Basic Metabolic Panel: Recent Labs  Lab 05/08/22 0219 05/09/22 0356 05/10/22 0301 05/11/22 0402 05/12/22 0419  NA 138 138 137 137 137  K 4.3 4.2 4.2 4.2 4.1  CL 104 104 103 105 103  CO2 25 25 28 27 25   GLUCOSE 113* 110* 111* 111* 111*  BUN 18 19 20  21* 17  CREATININE 1.45* 1.39* 1.44* 1.32* 1.35*  CALCIUM 8.7* 8.9 8.9 8.7* 8.6*     GFR: Estimated Creatinine Clearance: 56.9 mL/min (A) (by C-G formula based on SCr of 1.35 mg/dL (H)).  Liver Function Tests: Recent Labs  Lab 05/08/22 0219 05/09/22 0356 05/10/22 0301 05/11/22 0402 05/12/22 0419  AST 28 32 28 26 21   ALT 37 35 32 31 26  ALKPHOS 97 102 106 102 105  BILITOT 0.6 0.7 0.9 0.7 0.5  PROT 6.5 6.9 7.1 6.9 6.7  ALBUMIN 2.3* 2.5* 2.6* 2.5* 2.6*     CBG: Recent Labs  Lab 05/06/22 1553  GLUCAP 132*      No results found for this or any previous visit (from the past 240 hour(s)).       Radiology Studies: No results found.      Scheduled Meds:  amLODipine  10 mg Oral Daily   aspirin EC  81 mg Oral Daily   atorvastatin  80 mg Oral Daily   carvedilol  6.25 mg Oral BID WC   hydrALAZINE  25 mg Oral BID   nicotine  21 mg Transdermal Daily   pantoprazole  40 mg Oral Daily   sodium chloride flush  3 mL Intravenous Q12H   Continuous Infusions:  sodium chloride 180 mL/hr at 04/20/22 0944   sodium chloride 10 mL/hr at 04/25/22 1616     LOS: 42 days    Time spent: 35 minutes    07/06/22, MD [facility]   To contact the attending provider between 7A-7P or the covering provider during after hours 7P-7A, please log into the web site [email or web address] and access using universal [place] password for that web site. If you do not have the password, please call the hospital  operator.  05/13/2022, 12:30 PM

## 2022-05-13 NOTE — Progress Notes (Addendum)
  Progress Note    05/13/2022 7:50 AM 13 Days Post-Op  Subjective:  no complaints   Vitals:   05/12/22 2315 05/13/22 0320  BP: (!) 135/91 138/71  Pulse: 68 96  Resp: 13 16  Temp: 98.8 F (37.1 C) 98.1 F (36.7 C)  SpO2: 100% 100%   Physical Exam Lungs:  non labored Incisions:  R groin with vac in place Extremities:  AKA incisions are healing well Neurologic: A&O  CBC    Component Value Date/Time   WBC 10.1 05/12/2022 0419   RBC 2.97 (L) 05/12/2022 0419   HGB 8.7 (L) 05/12/2022 0419   HCT 26.5 (L) 05/12/2022 0419   PLT 283 05/12/2022 0419   MCV 89.2 05/12/2022 0419   MCH 29.3 05/12/2022 0419   MCHC 32.8 05/12/2022 0419   RDW 16.7 (H) 05/12/2022 0419   LYMPHSABS 1.8 05/01/2022 0202   MONOABS 1.2 (H) 05/01/2022 0202   EOSABS 0.0 05/01/2022 0202   BASOSABS 0.0 05/01/2022 0202    BMET    Component Value Date/Time   NA 137 05/12/2022 0419   K 4.1 05/12/2022 0419   CL 103 05/12/2022 0419   CO2 25 05/12/2022 0419   GLUCOSE 111 (H) 05/12/2022 0419   BUN 17 05/12/2022 0419   CREATININE 1.35 (H) 05/12/2022 0419   CALCIUM 8.6 (L) 05/12/2022 0419   GFRNONAA >60 05/12/2022 0419   GFRAA >60 01/12/2020 1421    INR    Component Value Date/Time   INR 1.2 04/11/2022 0437     Intake/Output Summary (Last 24 hours) at 05/13/2022 0750 Last data filed at 05/13/2022 0554 Gross per 24 hour  Intake 120 ml  Output 700 ml  Net -580 ml     Assessment/Plan:  51 y.o. male is s/p B AKA 13 Days Post-Op   R groin vac with good seal; vac change again tomorrow B AKA incisions appear to be healing well Patient now agreeable to SNF placement; he is ready for discharge once this is arranged by [facility] team   Emilie Rutter, PA-C [facility] [phone] 05/13/2022 7:50 AM  I have seen and evaluated the patient. I agree with the PA note as documented above.  Bilateral above-knee amputations are healing.  Next right groin VAC change tomorrow.  I looked at  this on Wednesday with wound care and the sartorius muscle flap looks great.  Can be discharged to SNF bed from my standpoint with VAC changes 3 times a week to the right groin.  Cephus Shelling, MD [facility] [place] Office: [phone]

## 2022-05-13 NOTE — TOC Progression Note (Signed)
Transition of Care [facility]) - Progression Note    Patient Details  Name: Jeffrey Fitzgerald MRN: [id number] Date of Birth: 1970-11-02  Transition of Care [facility]) CM/SW Contact  Eduard Roux, [place] Phone Number: 05/13/2022, 10:10 AM  Clinical Narrative:     Patient has no bed offers at this time.   Expected Discharge Plan: Skilled Nursing Facility Barriers to Discharge: Inadequate or no insurance, SNF Pending bed offer  Expected Discharge Plan and Services Expected Discharge Plan: Skilled Nursing Facility In-house Referral: Clinical Social Work Discharge Planning Services: CM Consult   Living arrangements for the past 2 months: Single Family Home                 DME Arranged: Hospital bed, Other see comment, Wheelchair manual DME Agency: [facility]                   Social Determinants of Health (SDOH) Interventions    Readmission Risk Interventions     No data to display

## 2022-05-14 MED ORDER — PANTOPRAZOLE SODIUM 40 MG PO TBEC: 40.0000 mg | DELAYED_RELEASE_TABLET | Freq: Every day | ORAL | 1 refills | Status: DC

## 2022-05-14 MED ORDER — NICOTINE 21 MG/24HR TD PT24: 21.0000 mg | MEDICATED_PATCH | Freq: Every day | TRANSDERMAL | 0 refills | Status: DC

## 2022-05-14 MED ORDER — AMLODIPINE BESYLATE 10 MG PO TABS: 10.0000 mg | ORAL_TABLET | Freq: Every day | ORAL | 1 refills | Status: DC

## 2022-05-14 MED ORDER — ATORVASTATIN CALCIUM 80 MG PO TABS: 80.0000 mg | ORAL_TABLET | Freq: Every day | ORAL | 1 refills | Status: DC

## 2022-05-14 MED ORDER — CARVEDILOL 6.25 MG PO TABS: 6.2500 mg | ORAL_TABLET | Freq: Two times a day (BID) | ORAL | 1 refills | Status: DC

## 2022-05-14 MED ORDER — HYDRALAZINE HCL 25 MG PO TABS: 25.0000 mg | ORAL_TABLET | Freq: Two times a day (BID) | ORAL | 1 refills | Status: DC

## 2022-05-14 MED ORDER — ASPIRIN 81 MG PO TBEC: 81.0000 mg | DELAYED_RELEASE_TABLET | Freq: Every day | ORAL | 12 refills | Status: DC

## 2022-05-14 NOTE — Progress Notes (Cosign Needed Addendum)
   Durable Medical Equipment (From admission, onward)        Start     Ordered  05/14/22 1417  For home use only DME Other see comment  Once      Comments: Sliding board 30 in Question:  Length of Need  Answer:  Lifetime  05/14/22 1416  05/14/22 1417  For home use only DME Hospital bed  Once      Question Answer Comment Length of Need Lifetime  Patient has (list medical condition): Bilateral AKA  The above medical condition requires: Patient requires the ability to reposition frequently  Head must be elevated greater than: 30 degrees  Bed type Semi-electric  Support Surface: Gel Overlay    05/14/22 1418  05/05/22 0757  For home use only DME standard manual wheelchair with seat cushion  Once      Comments: Patient suffers from bilateral above knee amputations which impairs their ability to perform daily activities like bathing, dressing, grooming, and toileting in the home.  A cane, crutch, or walker will not resolve issue with performing activities of daily living. A wheelchair will allow patient to safely perform daily activities. Patient can safely propel the wheelchair in the home or has a caregiver who can provide assistance. Length of need Lifetime. Accessories: elevating leg rests (ELRs), wheel locks, extensions and anti-tippers. Back cushion  05/05/22 0800  05/05/22 0755

## 2022-05-14 NOTE — TOC Progression Note (Addendum)
Transition of Care (TOC) - Progression Note  Donn Pierini RN, BSN Transitions of Care Unit 4E- RN Case Manager See Treatment Team for direct phone #    Patient Details  Name: Jeffrey Fitzgerald MRN: [id number] Date of Birth: [birth year]  Transition of [facility]) CM/SW Contact  Zenda Alpers, Lenn Sink, RN Phone Number: 05/14/2022, 2:29 PM  Clinical Narrative:    Per CSW pt has no SNF bed offers at this time, TC made to sister [name] to discuss transition plan at this time as per conversation earlier this week.  CSW- Erie Noe. And RNCM- Shelly Shoultz W. Both spoke with sister [name] by phone to provide her information about the lack of SNF bed offers and give options for return home w/ HH at this point.  Sister is agreeable to having pt return home w/ HH, and she will speak with her family to see if they can also provide her with further assistance at home when she has to leave for work obligations. For the most part sister will be at home w/ pt as she works from home- she just is limited in the physical help that she can provide due to health issues of her own.   Discussed with sister that pt has been accepted by [facility] agency- [facility] for needed home wound VAC drsg changes- will have HHRN/PT/OT services  Sister voiced that she will see about getting a private pay aide to assist her with ADLs vs family members.  TOC leadership Verdon Cummins S) has approved pt for assistance with DME using a LOG (letter of guarantee) CM will work with [employer] for needed DME- hospital bed, wheelchair and slider board (pt has refused hoyer lift). Discussed with sister timing for delivery and she voiced that she will need the weekend to make space in her home for DME needs and make arrangements for people to help her move things around.   TOC will give sister this weekend to get things in place and touch base with her on Monday to see if she is ready to move forward with ordering DME for delivery.  CM also will need to apply for  [employer] home VAC under charity program- [employer] order form along with additional Medicaid form as been placed on shadow chart for signature (both forms in highlighted areas need to be signed) 1520- updated- forms have been signed and faxed to [facility] to start process for [facility] home [facility] approval- will f/u with [employer] on Monday  1430-update- received call from sister, she is worked out to have help this weekend to have assistance to move things to make room for DME- hospital bed, she also has made arrangements to have her aunt come next thur to assist, and cousin on Friday while she has to into work for some meetings. Sister is also working on making arrangements to have pt's electric scooter picked up from the hospital and taken home.  Discussed as well that pt will transport home via EMS- tentative date pending DME delivery will be Tues or Wednesday next week-  pending when pt is transitioned home- [facility] will follow for a start of care either 9/20 or 9/22.   At this time- TOC will follow for ongoing transition needs with plans to discharge home w/ [facility].      Expected Discharge Plan: Skilled Nursing Facility Barriers to Discharge: Inadequate or no insurance, SNF Pending bed offer  Expected Discharge Plan and Services Expected Discharge Plan: Skilled Nursing Facility In-house Referral: Clinical Social Work Discharge Planning Services:  CM Consult Post Acute Care Choice: Durable Medical Equipment, Home Health Living arrangements for the past 2 months: Single Family Home                 DME Arranged: Hospital bed, Other see comment, Wheelchair manual DME Agency: [facility] Date DME Agency Contacted: 05/14/22 Time DME Agency Contacted: 1225 Representative spoke with at DME Agency: Ian Malkin HH Arranged: RN, PT HH Agency: [facility] Date HH Agency Contacted: 05/14/22 Time HH Agency Contacted: 1230 Representative spoke with at [facility] Agency: Morrie Sheldon   Social Determinants of Health (SDOH)  Interventions    Readmission Risk Interventions     No data to display

## 2022-05-14 NOTE — Progress Notes (Signed)
PROGRESS NOTE/consult    Jeffrey Fitzgerald  JSH:[id number] DOB: 1970/11/23 DOA: 04/01/2022 PCP: Patient, No Pcp Per    No chief complaint on file.   Brief Narrative:   Jeffrey Fitzgerald is a 51 y.o. male with past medical history of hypertension, peripheral vascular disease, and tobacco abuse who presented initially for critical left lower limb ischemia 8/3 requiring aortobifemoral bypass.  He then underwent left common femoral and posterior tibial bypass on 8/9 same-day for right common revision of the right lower extremity aortobifemoral bypass.  He returned to the OR on 8/11 and underwent thrombectomy of left common femoral to posterior tibial artery bypass, right lower extremity fasciotomy closure, and wound VAC placement.  White blood cell count had trended up to 24.8 and the patient has been temporarily treated empirically with Zosyn.  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  His hospital stay had been complicated by hemorrhagic bleed with hemoglobin trending down to 6 g/dL requiring 2 units of packed red blood cells and creatinine trended up to 3.59 before trending down.    He has been complaining of pain out of left leg which was noted to be cold and contracted.  Vascular had discussed need of left above-knee amputation, but he was not willing to undergo amputation of the left leg.  Palliative care had been consulted.  Patient was noted to become more tachycardic and hypertensive with blood pressures elevated into the 200s at times.  He denies any complaints of any chest pain, nausea, vomiting, or diarrhea.  Patient's white blood cell counts have been otherwise trending down.  TRH consulted to help with medical management.   Assessment & Plan:   Principal Problem:   Critical limb ischemia of  both lower extremities (HCC) Active Problems:   PAD (peripheral artery disease) (HCC)   Acute renal failure (HCC)   Other specified anemias   Pressure injury of skin   SIRS (systemic inflammatory response syndrome) (HCC)   Hypertensive urgency   Hypokalemia   Gastroesophageal reflux disease   Acute postoperative anemia due to expected blood loss  #1 critical limb ischemia -Patient presented with critical limb ischemia bilateral lower extremities required several attempts at revascularization. -Patient subsequently underwent right AKA on 04/22/2022. -Vascular surgery/primary team recommended left AKA however patient initially declined. -Palliative care consulted and following  and spoke with patient and family and patient decided to have left AKA which was done on 04/30/2022 per vascular surgery. -Continue aspirin, statin. -Pain management per primary team. -Continue right lower extremity wound VAC per vascular surgery. -TOC assisting with dispo planning, patient noted to have declined SNF, no insurance for home health with VAC changes. -Per vascular surgery, patient now amenable to SNF. -Per vascular surgery.  2.  SIRS -Patient noted to be tachycardic, tachypneic with a leukocytosis white count of 18.5 with noted to be trending down. -Prior blood cultures done with no growth to date. -Patient noted to have been on Zosyn which has subsequently been discontinued. -UA noted moderate hemoglobin, small leukocytes, rare bacteria, 6-10 WBCs.  Patient with no urinary symptoms. -Right lower extremity wound appears to be healing well. -Patient with no respiratory symptoms. -Leukocytosis trended down and have normalized. -Blood cultures with no growth to date x5 days. -Follow.  3.  Hypertensive urgency -Patient noted to have blood pressure elevated 198/86  with heart rates in the 120s. -Likely a component of pain contributing to hypertensive urgency. -Patient noted to not be on any  antihypertensive medications prior to admission. -Patient noted to have been on hydralazine 10 mg twice daily however did not obtain adequate blood pressure control. -Continue Coreg 6.25 mg twice daily, Norvasc 10 mg daily, hydralazine 25 mg twice daily. -Uptitrate antihypertensive medications for better blood pressure control if needed.  4.  Hypokalemia -Repleted.   -Potassium of 4.1 (05/12/2022)  5.  Acute renal failure -Baseline creatinine approximately 1-1.2. -Renal function noted to trend up to 3.59 on 04/11/2022 and felt likely secondary to contrast given. -Renal function improving with creatinine trending down with hydration currently at 1.35. -Urine output of 700 cc recorded over the past 24 hours.  -Follow.  6.  Urinary frequency -Patient noted to have urinary frequency without dysuria. -Urine cultures ordered but have seem to be canceled. -Currently asymptomatic. -No further work-up needed.  7.  Tobacco abuse -Tobacco cessation.   -Nicotine patch.    8.  GERD -Continue PPI.  9.  Postop acute blood loss anemia -Hemoglobin stable at 8.7. -Follow H&H.   -Transfusion threshold hemoglobin < 7.   DVT prophylaxis: Per primary team Code Status: Full Family Communication: Updated patient.  No family at bedside. Disposition: SNF when bed available.  Status is: Inpatient Remains inpatient appropriate because: Severity of illness   Consultants:  Cardiology: Dr. Percival Spanish 04/01/2022 Wound care RN, Val Riles 04/09/2022 Palliative care: Adam Phenix, NP 04/21/2022 [facility]: Dr. Tamala Julian 04/27/2022  Procedures:  Abdominal films 04/07/2022, 04/12/2022 Chest x-ray 04/07/2022, 04/18/2022 Myoview stress test 04/02/2022 2D echo 04/02/2022 Right AKA per vascular surgery: Dr. Trula Slade 04/22/2022 Reexploration and washout of right groin wound less than 30 days postop, sharp excisional debridement of right groin wound, repair of aortobifemoral to profunda femoris artery bypass with  pledgeted suture, right groin sartorius muscle flap coverage, application negative pressure dressing per vascular surgery: Dr. Donzetta Matters 04/20/2022 Right leg fasciotomy washout, soft tissue debridement, right groin washout, soft tissue debridement, VAC placement per vascular surgery: Dr. Unk Lightning 04/20/2022 Thrombectomy of left common femoral to posterior tibial artery bypass, closure of right medial calf fasciotomy, application of negative pressure wound VAC greater right lateral calf fasciotomy per vascular surgery: Dr. Carlis Abbott 04/09/2022 Right greater saphenous vein harvest, right common femoral to posterior tibial artery bypass with 6 mm PTFE/greater saphenous vein composite graft, right lower extremity angiogram, right lower extremity 4 compartment fasciotomy, right posterior tibial artery thrombectomy per Dr. Stanford Breed, Dr. Carlis Abbott vascular surgery 04/08/2022 Redo exploration of the right groin/right common femoral artery thrombectomy/extensive endarterectomy of right profunda with bovine pericardial patch angioplasty, revision of right limb of the aortobifemoral bypass graft 04/07/2022 per Dr. Donzetta Matters. Aortobifemoral bypass, right common femoral endarterectomy with profundoplasty, left common femoral endarterectomy with profundoplasty, harvest of left leg great saphenous vein, left common femoral to posterior tibial bypass with composite PTFE and great saphenous vein per vascular surgery: Dr. Carlis Abbott 04/07/2022 Aortobifemoral bypass, bilateral common femoral and profunda femoris endarterectomies, harvest left greater saphenous vein, left common femoral to posterior tibial artery bypass with composite 6 mm E PTFE in reverse greater saphenous vein and anatomic tunnel by Dr. Carlis Abbott at [facility] 04/07/2022 Attempted ultrasound-guided access left common femoral artery, ultrasound-guided access right common femoral artery, aortogram with catheter selection of the aorta, bilateral lower extremity arteriogram with runoff,  32 minutes monitored moderate conscious sedation time Per vascular surgery: Dr. Carlis Abbott 04/01/2022 Left AKA per vascular surgery: Dr. Donzetta Matters 04/30/2022  Antimicrobials:  Anti-infectives (From admission, onward)    Start     Dose/Rate Route Frequency Ordered Stop   04/21/22 1315  vancomycin (VANCOCIN) IVPB 1000 mg/200 mL premix  Status:  Discontinued        1,000 mg 200 mL/hr over 60 Minutes Intravenous Every 24 hours 04/21/22 0933 04/27/22 1020   04/19/22 1315  vancomycin (VANCOREADY) IVPB 750 mg/150 mL  Status:  Discontinued        750 mg 150 mL/hr over 60 Minutes Intravenous Every 24 hours 04/19/22 1218 04/21/22 0933   04/18/22 1000  piperacillin-tazobactam (ZOSYN) IVPB 3.375 g  Status:  Discontinued        3.375 g 12.5 mL/hr over 240 Minutes Intravenous Every 8 hours 04/18/22 0925 04/27/22 1020   04/08/22 2200  linezolid (ZYVOX) IVPB 600 mg        600 mg 300 mL/hr over 60 Minutes Intravenous Every 12 hours 04/08/22 1622 04/15/22 2359   04/08/22 0000  ceFAZolin (ANCEF) IVPB 2g/100 mL premix        2 g 200 mL/hr over 30 Minutes Intravenous Every 8 hours 04/07/22 2316 04/08/22 1559   04/07/22 1529  ceFAZolin (ANCEF) IVPB 2g/100 mL premix  Status:  Discontinued        2 g 200 mL/hr over 30 Minutes Intravenous 30 min pre-op 04/07/22 1530 04/07/22 2005   04/06/22 0932  vancomycin (VANCOREADY) IVPB 750 mg/150 mL  Status:  Discontinued        750 mg 150 mL/hr over 60 Minutes Intravenous Every 12 hours 04/06/22 0903 04/08/22 1634   04/05/22 2200  vancomycin (VANCOREADY) IVPB 1250 mg/250 mL  Status:  Discontinued        1,250 mg 166.7 mL/hr over 90 Minutes Intravenous Every 48 hours 04/04/22 1728 04/05/22 1023   04/05/22 1800  vancomycin (VANCOREADY) IVPB 1250 mg/250 mL  Status:  Discontinued        1,250 mg 166.7 mL/hr over 90 Minutes Intravenous Every 24 hours 04/05/22 1023 04/06/22 0903   04/02/22 2200  vancomycin (VANCOREADY) IVPB 750 mg/150 mL  Status:  Discontinued        750 mg 150 mL/hr  over 60 Minutes Intravenous Every 12 hours 04/02/22 0853 04/04/22 1728   04/02/22 1000  piperacillin-tazobactam (ZOSYN) IVPB 3.375 g        3.375 g 12.5 mL/hr over 240 Minutes Intravenous Every 8 hours 04/02/22 0828 04/15/22 2359   04/02/22 1000  vancomycin (VANCOREADY) IVPB 1500 mg/300 mL        1,500 mg 150 mL/hr over 120 Minutes Intravenous  Once 04/02/22 0828 04/02/22 1141         Subjective: Watching television.  No chest pain.  No shortness of breath.  No abdominal pain.  Tolerating diet.   Objective: Vitals:   05/14/22 0404 05/14/22 0743 05/14/22 1016 05/14/22 1535  BP: (!) 147/76 114/77 (!) 149/97 125/62  Pulse: (!) 108 99 99 100  Resp: 13 12  18   Temp: 97.9 F (36.6 C) 97.9 F (36.6 C)  98.1 F (36.7 C)  TempSrc: Oral Oral  Oral  SpO2: 100% 100%  91%  Weight:      Height:        Intake/Output Summary (Last 24 hours) at 05/14/2022 1756 Last data filed at 05/14/2022 1245 Gross per 24 hour  Intake 740 ml  Output 1050 ml  Net -310 ml    Filed Weights   04/09/22 0830 04/22/22 0831 04/30/22 0825  Weight: 68.8 kg 65.8 kg 65.8 kg  Examination:  General exam: NAD. Respiratory system: CTA B.  No wheezes, no crackles, no rhonchi.  Fair air movement.  Speaking in full sentences.  Cardiovascular system: RRR no murmurs rubs or gallops.  No JVD.  No lower extremity edema. Gastrointestinal system: Abdomen is soft, nontender, nondistended, positive bowel sounds.  No rebound.  Central nervous system: Alert and oriented. No focal neurological deficits. Extremities: Status post right AKA with incision site c/d/i.  Status post left AKA with incision site c/d/I, with staples intact.  Skin: No rashes, lesions or ulcers Psychiatry: Judgement and insight normal.  Mood and affect appropriate.    Data Reviewed: I have personally reviewed following labs and imaging studies  CBC: Recent Labs  Lab 05/08/22 0219 05/09/22 0356 05/10/22 0301 05/11/22 0402 05/12/22 0419  WBC  11.3* 9.9 9.4 9.5 10.1  HGB 8.2* 8.6* 9.4* 9.0* 8.7*  HCT 25.0* 26.6* 29.6* 27.5* 26.5*  MCV 89.0 89.9 90.2 88.7 89.2  PLT 293 305 330 303 283     Basic Metabolic Panel: Recent Labs  Lab 05/08/22 0219 05/09/22 0356 05/10/22 0301 05/11/22 0402 05/12/22 0419  NA 138 138 137 137 137  K 4.3 4.2 4.2 4.2 4.1  CL 104 104 103 105 103  CO2 25 25 28 27 25   GLUCOSE 113* 110* 111* 111* 111*  BUN 18 19 20  21* 17  CREATININE 1.45* 1.39* 1.44* 1.32* 1.35*  CALCIUM 8.7* 8.9 8.9 8.7* 8.6*     GFR: Estimated Creatinine Clearance: 56.9 mL/min (A) (by C-G formula based on SCr of 1.35 mg/dL (H)).  Liver Function Tests: Recent Labs  Lab 05/08/22 0219 05/09/22 0356 05/10/22 0301 05/11/22 0402 05/12/22 0419  AST 28 32 28 26 21   ALT 37 35 32 31 26  ALKPHOS 97 102 106 102 105  BILITOT 0.6 0.7 0.9 0.7 0.5  PROT 6.5 6.9 7.1 6.9 6.7  ALBUMIN 2.3* 2.5* 2.6* 2.5* 2.6*     CBG: No results for input(s): "GLUCAP" in the last 168 hours.    No results found for this or any previous visit (from the past 240 hour(s)).       Radiology Studies: No results found.      Scheduled Meds:  amLODipine  10 mg Oral Daily   aspirin EC  81 mg Oral Daily   atorvastatin  80 mg Oral Daily   carvedilol  6.25 mg Oral BID WC   hydrALAZINE  25 mg Oral BID   nicotine  21 mg Transdermal Daily   pantoprazole  40 mg Oral Daily   sodium chloride flush  3 mL Intravenous Q12H   Continuous Infusions:  sodium chloride 180 mL/hr at 04/20/22 0944   sodium chloride 10 mL/hr at 04/25/22 1616     LOS: 43 days    Time spent: 35 minutes    , MD [facility]   To contact the attending provider between 7A-7P or the covering provider during after hours 7P-7A, please log into the web site [email or web address] and access using universal [email or web address] password for that web site. If you do not have the password, please call the hospital operator.  05/14/2022, 5:56 PM

## 2022-05-14 NOTE — Progress Notes (Signed)
Occupational Therapy Treatment Patient Details Name: Jeffrey Fitzgerald MRN: [id number] DOB: 1971-04-22 Today's Date: 05/14/2022   History of present illness Pt is a 51 yo male admitted 04/01/22 from cath lab after aortogram for LLE ischemia. S/p aortobifemoral bypass graft with bilateral femoral endarterectomy on 8/9. Post op RLE ischemia with return to OR same date for redo R groin exploration with thrombectomy and angioplasty. S/p RLE fem-pop bypass graft with 4 compartment fasciotomy on 8/10. S/p thrombectomy left femoral to popliteal BPG wtih closure and right fasciotomy wtih VAC on right LE on 8/11. Ileus with emesis on 8/14. Complicated hospital course. 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 PMH includes HTN, tobacco smoking, PVD.   OT comments  Patient in supine and agreeable to OT session patient able to sit up in bed in long sitting position with min assist. Patient performed BUE strengthening exercises for 2 sets of 10 with red therapy band and trunk strengthening exercises until patient stated he had increased pain in groin area. Patient to continue to be followed by acute OT.    Recommendations for follow up therapy are one component of a multi-disciplinary discharge planning process, led by the attending physician.  Recommendations may be updated based on patient status, additional functional criteria and insurance authorization.    Follow Up Recommendations  Skilled nursing-short term rehab (<3 hours/day)    Assistance Recommended at Discharge Frequent or constant Supervision/Assistance  Patient can return home with the following  A lot of help with bathing/dressing/bathroom;Direct supervision/assist for medications management;Assist for transportation;Help with stairs or ramp for entrance;A lot of help with walking and/or transfers;Assistance with cooking/housework   Equipment Recommendations  Other (comment) (defer to next venue)    Recommendations for Other Services      Precautions /  Restrictions Precautions Precautions: Fall;Other (comment) Precaution Comments: R groin wound vac Restrictions Weight Bearing Restrictions: Yes RLE Weight Bearing: Non weight bearing LLE Weight Bearing: Non weight bearing       Mobility Bed Mobility Overal bed mobility: Needs Assistance Bed Mobility: Supine to Sit     Supine to sit: Min assist     General bed mobility comments: min assist to come to long sitting in bed    Transfers Overall transfer level: Needs assistance                 General transfer comment: declined transfer     Balance Overall balance assessment: Needs assistance Sitting-balance support: No upper extremity supported Sitting balance-Leahy Scale: Fair Sitting balance - Comments: able to maintain sitting balance in long sitting positon in bed while performing UE HEP and trunk strengthening                                   ADL either performed or assessed with clinical judgement   ADL Overall ADL's : Needs assistance/impaired                                       General ADL Comments: focused on BUE HEP and trunk strengthening exercises while in long sitting    Extremity/Trunk Assessment              Vision       Perception     Praxis      Cognition Arousal/Alertness: Awake/alert Behavior During Therapy: Flat affect Overall Cognitive Status:  No family/caregiver present to determine baseline cognitive functioning Area of Impairment: Attention, Following commands, Safety/judgement, Awareness, Problem solving                   Current Attention Level: Sustained Memory: Decreased short-term memory Following Commands: Follows one step commands consistently, Follows one step commands with increased time Safety/Judgement: Decreased awareness of safety, Decreased awareness of deficits   Problem Solving: Requires verbal cues General Comments: increased time to follow directions         Exercises Exercises: General Upper Extremity General Exercises - Upper Extremity Shoulder Flexion: Strengthening, Both, 20 reps, Seated, Theraband Theraband Level (Shoulder Flexion): Level 2 (Red) Shoulder ABduction: Strengthening, Both, 20 reps, Seated, Theraband Theraband Level (Shoulder Abduction): Level 2 (Red) Elbow Flexion: Strengthening, Both, 20 reps, Seated, Theraband Theraband Level (Elbow Flexion): Level 2 (Red) Elbow Extension: Strengthening, 20 reps, Seated, Both, Theraband Theraband Level (Elbow Extension): Level 2 (Red)    Shoulder Instructions       General Comments      Pertinent Vitals/ Pain       Pain Assessment Pain Assessment: Faces Faces Pain Scale: Hurts a little bit Pain Location: groin at end of sessoin Pain Descriptors / Indicators: Grimacing, Sore Pain Intervention(s): Limited activity within patient's tolerance, Monitored during session, Repositioned  Home Living                                          Prior Functioning/Environment              Frequency  Min 2X/week        Progress Toward Goals  OT Goals(current goals can now be found in the care plan section)  Progress towards OT goals: Progressing toward goals  Acute Rehab OT Goals Patient Stated Goal: get better OT Goal Formulation: With patient Time For Goal Achievement: 05/17/22 Potential to Achieve Goals: Fair ADL Goals Pt Will Perform Lower Body Bathing: with min assist;sitting/lateral leans Pt Will Perform Lower Body Dressing: with min assist;sitting/lateral leans Pt Will Transfer to Toilet: with mod assist (anterior/posterior) Pt/caregiver will Perform Home Exercise Program: Increased strength;Both right and left upper extremity;With theraband;With Supervision;With written HEP provided Additional ADL Goal #1: Bed mobility with min A in preparation for ADL tasks  Plan Frequency remains appropriate;Discharge plan remains appropriate     Co-evaluation                 AM-PAC OT "6 Clicks" Daily Activity     Outcome Measure   Help from another person eating meals?: None Help from another person taking care of personal grooming?: A Little Help from another person toileting, which includes using toliet, bedpan, or urinal?: A Lot Help from another person bathing (including washing, rinsing, drying)?: A Lot Help from another person to put on and taking off regular upper body clothing?: None Help from another person to put on and taking off regular lower body clothing?: A Lot 6 Click Score: 17    End of Session Equipment Utilized During Treatment: Other (comment) (red therapy band)  OT Visit Diagnosis: Unsteadiness on feet (R26.81);Other abnormalities of gait and mobility (R26.89);Muscle weakness (generalized) (M62.81);Other symptoms and signs involving cognitive function   Activity Tolerance Patient tolerated treatment well   Patient Left in bed;with call bell/phone within reach   Nurse Communication Mobility status        Time: 5643-3295 OT Time Calculation (min):  18 min  Charges: OT General Charges $OT Visit: 1 Visit OT Treatments $Therapeutic Exercise: 8-22 mins  Lodema Hong, [place]  Office [place] 05/14/2022, 12:10 PM

## 2022-05-14 NOTE — Consult Note (Signed)
WOC Nurse wound follow up Patient receiving care in [facility] 4E12 Possible DC to SNF when placement is found. Wound type: Surgical Measurement: 9 cm x 4.5 cm x 0.2 cm Wound bed: Healthy granulation tissue with sartorius muscle (per Dr. Chestine Spore) visible in the center of the wound Drainage (amount, consistency, odor) serous in canister Periwound: intact Dressing procedure/placement/frequency: One piece of black foam removed. One barrier ring applied at the crease of the groin. One piece of black foam replaced. Drape applied, immediate suction obtained at 75 mmHg.   WOC following MWF   Chip Boer L. Katrinka Blazing, MSN, RN, CMSRN, Angus Seller, [facility] Wound Treatment Associate Pager [phone]

## 2022-05-14 NOTE — Progress Notes (Signed)
Physical Therapy Treatment Patient Details Name: Jeffrey Fitzgerald MRN: [id number] DOB: 06/03/1971 Today's Date: 05/14/2022   History of Present Illness Pt is a 51 yo male admitted 04/01/22 from cath lab after aortogram for LLE ischemia. S/p aortobifemoral bypass graft with bilateral femoral endarterectomy on 8/9. Post op RLE ischemia with return to OR same date for redo R groin exploration with thrombectomy and angioplasty. S/p RLE fem-pop bypass graft with 4 compartment fasciotomy on 8/10. S/p thrombectomy left femoral to popliteal BPG wtih closure and right fasciotomy wtih VAC on right LE on 8/11. Ileus with emesis on 8/14. Complicated hospital course. 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 PMH includes HTN, tobacco smoking, PVD.    PT Comments    Pt received supine and motivated for session for transfers training to power chair. Pt overall needing less assist grossly throughout session. Pt needing min assist to come to sitting EOB, with assist needed to elevate trunk and to scoot to EOB. Pt with good initiation during lateral scoot transfers with fair power through UE and assist needed to complete transfer. Pt declining further exercises despite encouragement. Pt continues to benefit from skilled PT services to progress toward functional mobility goals.    Recommendations for follow up therapy are one component of a multi-disciplinary discharge planning process, led by the attending physician.  Recommendations may be updated based on patient status, additional functional criteria and insurance authorization.  Follow Up Recommendations  Skilled nursing-short term rehab (<3 hours/day) Can patient physically be transported by private vehicle: No   Assistance Recommended at Discharge Intermittent Supervision/Assistance  Patient can return home with the following A lot of help with walking and/or transfers;A lot of help with bathing/dressing/bathroom;Assistance with cooking/housework;Direct  supervision/assist for medications management;Direct supervision/assist for financial management;Assist for transportation;Help with stairs or ramp for entrance   Equipment Recommendations  Wheelchair (measurements PT);Wheelchair cushion (measurements PT);Hospital bed    Recommendations for Other Services       Precautions / Restrictions Precautions Precautions: Fall;Other (comment) Precaution Comments: R groin wound vac Restrictions Weight Bearing Restrictions: Yes RLE Weight Bearing: Non weight bearing LLE Weight Bearing: Non weight bearing     Mobility  Bed Mobility Overal bed mobility: Needs Assistance Bed Mobility: Supine to Sit     Supine to sit: Min assist Sit to supine: Min guard   General bed mobility comments: min assist to come to long sitting in bed and to scoot to sit EOB    Transfers Overall transfer level: Needs assistance Equipment used: None Transfers: Bed to chair/wheelchair/BSC Sit to Stand: Min assist          Lateral/Scoot Transfers: Min assist General transfer comment: min assist to lateral scoot to power chair, pt with increased initation and UE effort from chair>bed with light min asssit needed to complete scoot    Ambulation/Gait                   Stairs             Wheelchair Mobility    Modified Rankin (Stroke Patients Only)       Balance Overall balance assessment: Needs assistance Sitting-balance support: No upper extremity supported Sitting balance-Leahy Scale: Fair Sitting balance - Comments: able to maintain sitting balance in long sitting positon in bed while performing UE HEP and trunk strengthening  Cognition Arousal/Alertness: Awake/alert Behavior During Therapy: Flat affect Overall Cognitive Status: No family/caregiver present to determine baseline cognitive functioning Area of Impairment: Attention, Following commands, Safety/judgement, Awareness,  Problem solving                   Current Attention Level: Sustained Memory: Decreased short-term memory Following Commands: Follows one step commands consistently, Follows one step commands with increased time Safety/Judgement: Decreased awareness of safety, Decreased awareness of deficits   Problem Solving: Requires verbal cues General Comments: increased time to follow directions        Exercises      General Comments General comments (skin integrity, edema, etc.): pt motivated for trasnfer to scooter with increased intiation overall and increased UE use with pt needing min assist to scoot with pad      Pertinent Vitals/Pain Pain Assessment Pain Assessment: Faces Faces Pain Scale: Hurts a little bit Pain Location: groin at end of sessoin Pain Descriptors / Indicators: Grimacing, Sore Pain Intervention(s): Limited activity within patient's tolerance, Monitored during session    Home Living                          Prior Function            PT Goals (current goals can now be found in the care plan section) Acute Rehab PT Goals PT Goal Formulation: With patient Time For Goal Achievement: 05/20/22    Frequency    Min 3X/week      PT Plan      Co-evaluation              AM-PAC PT "6 Clicks" Mobility   Outcome Measure  Help needed turning from your back to your side while in a flat bed without using bedrails?: A Little Help needed moving from lying on your back to sitting on the side of a flat bed without using bedrails?: A Lot Help needed moving to and from a bed to a chair (including a wheelchair)?: Total Help needed standing up from a chair using your arms (e.g., wheelchair or bedside chair)?: Total Help needed to walk in hospital room?: Total Help needed climbing 3-5 steps with a railing? : Total 6 Click Score: 9    End of Session   Activity Tolerance: Patient tolerated treatment well Patient left: in bed;with call bell/phone  within reach;with bed alarm set Nurse Communication: Mobility status PT Visit Diagnosis: Other abnormalities of gait and mobility (R26.89);Difficulty in walking, not elsewhere classified (R26.2);Muscle weakness (generalized) (M62.81)     Time: 5809-9833 PT Time Calculation (min) (ACUTE ONLY): 23 min  Charges:  $Therapeutic Activity: 23-37 mins                     Lylia Karn R. PTA Acute Rehabilitation Services Office: [phone]    Catalina Antigua 05/14/2022, 1:27 PM

## 2022-05-14 NOTE — Progress Notes (Signed)
[facility]   Subjective  - no complaints.   Objective 114/77 99 97.9 F (36.6 C) (Oral) 12 100%  Intake/Output Summary (Last 24 hours) at 05/14/2022 0835 Last data filed at 05/14/2022 0745 Gross per 24 hour  Intake 480 ml  Output 1050 ml  Net -570 ml    Midline incision and left groin incision healed Right groin incision with VAC Bilateral AKA's healing with staples  Laboratory Lab Results: Recent Labs    05/12/22 0419  WBC 10.1  HGB 8.7*  HCT 26.5*  PLT 283   BMET Recent Labs    05/12/22 0419  NA 137  K 4.1  CL 103  CO2 25  GLUCOSE 111*  BUN 17  CREATININE 1.35*  CALCIUM 8.6*    COAG Lab Results  Component Value Date   INR 1.2 04/11/2022   INR 1.5 (H) 04/07/2022   INR 0.9 03/09/2022   No results found for: "PTT"  Assessment/Planning:  51 year old male status post aortobifemoral bypass as well as bilateral fem-tib bypasses.  Ultimately required bilateral AKA's.  Both AKA's are healing.  Midline and left groin incision healed.  VAC to right groin where the sartorius muscle flap is looking great.  Ready for SNF when bed available.  Will need VAC change 3 times a week to right groin where he has a muscle flap.  Bilateral femoral pulses palpable indicating aortobifem patent.  Cephus Shelling 05/14/2022 8:35 AM --

## 2022-05-15 NOTE — Plan of Care (Signed)
  Problem: Education: Goal: Knowledge of General Education information will improve Description: Including pain rating scale, medication(s)/side effects and non-pharmacologic comfort measures Outcome: Progressing   Problem: Health Behavior/Discharge Planning: Goal: Ability to manage health-related needs will improve Outcome: Progressing   Problem: Clinical Measurements: Goal: Ability to maintain clinical measurements within normal limits will improve 05/15/2022 2338 by Jorge Mandril, RN Outcome: Progressing 05/15/2022 2322 by Jorge Mandril, RN Outcome: Progressing Goal: Will remain free from infection 05/15/2022 2338 by Jorge Mandril, RN Outcome: Progressing 05/15/2022 2322 by Jorge Mandril, RN Outcome: Progressing Goal: Diagnostic test results will improve Outcome: Progressing   Problem: Activity: Goal: Risk for activity intolerance will decrease Outcome: Progressing

## 2022-05-15 NOTE — Progress Notes (Addendum)
[facility] [place]  Subjective  - no new complaints   Objective 120/68 (!) 102 98 F (36.7 C) (Oral) 13 100%  Intake/Output Summary (Last 24 hours) at 05/15/2022 0918 Last data filed at 05/15/2022 0816 Gross per 24 hour  Intake 490 ml  Output 825 ml  Net -335 ml   Right groin with vac to suction, good seal B AKA's healing well and appear viable  Lungs non labored breathing  Assessment/Planning: 51 year old male status post aortobifemoral bypass as well as bilateral fem-tib bypasses.  Ultimately required bilateral AKA's.  Both AKA's are healing.   Vac to suction change 3 x week Pending discharge plan for home with family and [place] current care  Roxy Horseman 05/15/2022 9:18 AM --  Laboratory Lab Results: No results for input(s): "WBC", "HGB", "HCT", "PLT" in the last 72 hours. BMET No results for input(s): "NA", "K", "CL", "CO2", "GLUCOSE", "BUN", "CREATININE", "CALCIUM" in the last 72 hours.  COAG Lab Results  Component Value Date   INR 1.2 04/11/2022   INR 1.5 (H) 04/07/2022   INR 0.9 03/09/2022   No results found for: "PTT"   I have independently interviewed and examined patient and agree with PA assessment and plan above.   Elliona Doddridge C. Donzetta Matters, MD [facility] [place] Office: [phone] Pager: [phone]

## 2022-05-15 NOTE — Plan of Care (Signed)
  Problem: Health Behavior/Discharge Planning: Goal: Ability to manage health-related needs will improve Outcome: Progressing   Problem: Clinical Measurements: Goal: Ability to maintain clinical measurements within normal limits will improve Outcome: Progressing Goal: Will remain free from infection Outcome: Progressing Goal: Diagnostic test results will improve Outcome: Progressing   

## 2022-05-16 NOTE — Progress Notes (Addendum)
[facility] [place]  Subjective  - Comfortable no acute distress   Objective 124/66 98 98.6 F (37 C) (Axillary) 18 100%  Intake/Output Summary (Last 24 hours) at 05/16/2022 0841 Last data filed at 05/16/2022 0359 Gross per 24 hour  Intake 400 ml  Output 485 ml  Net -85 ml    Right groin wound vac in place to suction B AKA healing well and appear viable  Lungs non labored breathing Independent bed mobility  Assessment/Planning: 51 year old male status post aortobifemoral bypass as well as bilateral fem-tib bypasses.  Ultimately required bilateral AKA's.  Both AKA's are healing.    Vac to suction change 3 x week Pending discharge plan for home with family and HH.  Has motorized scooter in room for preporation of discharge.   Continue current care    Roxy Horseman 05/16/2022 8:41 AM --  Laboratory Lab Results: No results for input(s): "WBC", "HGB", "HCT", "PLT" in the last 72 hours. BMET No results for input(s): "NA", "K", "CL", "CO2", "GLUCOSE", "BUN", "CREATININE", "CALCIUM" in the last 72 hours.  COAG Lab Results  Component Value Date   INR 1.2 04/11/2022   INR 1.5 (H) 04/07/2022   INR 0.9 03/09/2022   No results found for: "PTT"  I have independently interviewed and examined patient and agree with PA assessment and plan above.  Right groin wound VAC to suction.  Pending home health.  Mileigh Tilley C. Donzetta Matters, MD [facility] [place] Office: [phone] Pager: [phone]

## 2022-05-16 NOTE — Progress Notes (Signed)
PROGRESS NOTE/consult    Jeffrey Fitzgerald  GYK:[id number] DOB: [birth year] DOA: 04/01/2022 PCP: Patient, No Pcp Per    No chief complaint on file.   Brief Narrative:   Jeffrey Fitzgerald is a 51 y.o. male with past medical history of hypertension, peripheral vascular disease, and tobacco abuse who presented initially for critical left lower limb ischemia 8/3 requiring aortobifemoral bypass.  He then underwent left common femoral and posterior tibial bypass on 8/9 same-day for right common revision of the right lower extremity aortobifemoral bypass.  He returned to the OR on 8/11 and underwent thrombectomy of left common femoral to posterior tibial artery bypass, right lower extremity fasciotomy closure, and wound VAC placement.  White blood cell count had trended up to 24.8 and the patient has been temporarily treated empirically with Zosyn.  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  His hospital stay had been complicated by hemorrhagic bleed with hemoglobin trending down to 6 g/dL requiring 2 units of packed red blood cells and creatinine trended up to 3.59 before trending down.    He has been complaining of pain out of left leg which was noted to be cold and contracted.  Vascular had discussed need of left above-knee amputation, but he was not willing to undergo amputation of the left leg.  Palliative care had been consulted.  Patient was noted to become more tachycardic and hypertensive with blood pressures elevated into the 200s at times.  He denies any complaints of any chest pain, nausea, vomiting, or diarrhea.  Patient's white blood cell counts have been otherwise trending down.  TRH consulted to help with medical management.   Assessment & Plan:   Principal Problem:   Critical limb ischemia of  both lower extremities (HCC) Active Problems:   PAD (peripheral artery disease) (HCC)   Acute renal failure (HCC)   Other specified anemias   Pressure injury of skin   SIRS (systemic inflammatory response syndrome) (HCC)   Hypertensive urgency   Hypokalemia   Gastroesophageal reflux disease   Acute postoperative anemia due to expected blood loss  #1 critical limb ischemia -Patient presented with critical limb ischemia bilateral lower extremities required several attempts at revascularization. -Patient subsequently underwent right AKA on 04/22/2022. -Vascular surgery/primary team recommended left AKA however patient initially declined. -Palliative care consulted and following  and spoke with patient and family and patient decided to have left AKA which was done on 04/30/2022 per vascular surgery. -Continue aspirin, statin. -Pain management per primary team. -Continue right lower extremity wound VAC per vascular surgery. -TOC assisting with dispo planning, patient noted to have declined SNF, no insurance for home health with VAC changes. -Per vascular surgery, patient now amenable to SNF. -Disposition Home with home health versus SNF however per primary. -Per vascular surgery.  2.  SIRS -Patient noted to be tachycardic, tachypneic with a leukocytosis white count of 18.5 with noted to be trending down. -Prior blood cultures done with no growth to date. -Patient noted to have been on Zosyn which has subsequently been discontinued. -UA noted moderate hemoglobin, small leukocytes, rare bacteria, 6-10 WBCs.  Patient with no urinary symptoms. -Right lower extremity wound appears to be healing well. -Patient with no respiratory symptoms. -Leukocytosis trended down and have normalized. -Blood cultures with no growth to date x5 days. -Follow.  3.  Hypertensive urgency -Patient noted to have blood pressure elevated 198/86 with heart rates in the 120s. -Likely a component of pain contributing  to hypertensive urgency. -Patient noted to not be on any antihypertensive medications prior to admission. -Patient noted to have been on hydralazine 10 mg twice daily however did not obtain adequate blood pressure control. -Continue Coreg 6.25 mg twice daily, Norvasc 10 mg daily, hydralazine 25 mg twice daily. -Uptitrate antihypertensive medications for better blood pressure control if needed. -We will need outpatient follow-up with PCP on discharge.  4.  Hypokalemia -Repleted.   -Potassium of 4.1 (05/12/2022)  5.  Acute renal failure -Baseline creatinine approximately 1-1.2. -Renal function noted to trend up to 3.59 on 04/11/2022 and felt likely secondary to contrast given. -Renal function improving with creatinine trending down with hydration currently at 1.35. -Urine output of 650 cc recorded over the past 24 hours.  -Follow.  6.  Urinary frequency -Patient noted to have urinary frequency without dysuria. -Urine cultures ordered but have seem to be canceled. -Currently asymptomatic. -No further work-up needed.  7.  Tobacco abuse -Tobacco cessation.   -Nicotine patch.    8.  GERD -PPI.    9.  Postop acute blood loss anemia -Hemoglobin stable at 8.7(05/12/2022) -Follow H&H.   -Transfusion threshold hemoglobin < 7.   DVT prophylaxis: Per primary team Code Status: Full Family Communication: Updated patient.  No family at bedside. Disposition: SNF versus home with home health, per primary team.  Medically stable from a medicine standpoint for discharge.  Status is: Inpatient Remains inpatient appropriate because: Severity of illness   Consultants:  Cardiology: Dr. Antoine Poche 04/01/2022 Wound care RN, Helmut Muster 04/09/2022 Palliative care: Albesa Seen, NP 04/21/2022 [facility]: Dr. Katrinka Blazing 04/27/2022  Procedures:  Abdominal films 04/07/2022, 04/12/2022 Chest x-ray 04/07/2022, 04/18/2022 Myoview stress test 04/02/2022 2D echo 04/02/2022 Right AKA per vascular surgery: Dr.  Myra Gianotti 04/22/2022 Reexploration and washout of right groin wound less than 30 days postop, sharp excisional debridement of right groin wound, repair of aortobifemoral to profunda femoris artery bypass with pledgeted suture, right groin sartorius muscle flap coverage, application negative pressure dressing per vascular surgery: Dr. Randie Heinz 04/20/2022 Right leg fasciotomy washout, soft tissue debridement, right groin washout, soft tissue debridement, VAC placement per vascular surgery: Dr. Sherral Hammers 04/20/2022 Thrombectomy of left common femoral to posterior tibial artery bypass, closure of right medial calf fasciotomy, application of negative pressure wound VAC greater right lateral calf fasciotomy per vascular surgery: Dr. Chestine Spore 04/09/2022 Right greater saphenous vein harvest, right common femoral to posterior tibial artery bypass with 6 mm PTFE/greater saphenous vein composite graft, right lower extremity angiogram, right lower extremity 4 compartment fasciotomy, right posterior tibial artery thrombectomy per Dr. Lenell Antu, Dr. Chestine Spore vascular surgery 04/08/2022 Redo exploration of the right groin/right common femoral artery thrombectomy/extensive endarterectomy of right profunda with bovine pericardial patch angioplasty, revision of right limb of the aortobifemoral bypass graft 04/07/2022 per Dr. Randie Heinz. Aortobifemoral bypass, right common femoral endarterectomy with profundoplasty, left common femoral endarterectomy with profundoplasty, harvest of left leg great saphenous vein, left common femoral to posterior tibial bypass with composite PTFE and great saphenous vein per vascular surgery: Dr. Chestine Spore 04/07/2022 Aortobifemoral bypass, bilateral common femoral and profunda femoris endarterectomies, harvest left greater saphenous vein, left common femoral to posterior tibial artery bypass with composite 6 mm E PTFE in reverse greater saphenous vein and anatomic tunnel by Dr. Chestine Spore at [facility] 04/07/2022 Attempted  ultrasound-guided access left common femoral artery, ultrasound-guided access right common femoral artery, aortogram  with catheter selection of the aorta, bilateral lower extremity arteriogram with runoff, 32 minutes monitored moderate conscious sedation time Per vascular surgery: Dr. Carlis Abbott 04/01/2022 Left AKA per vascular surgery: Dr. Donzetta Matters 04/30/2022  Antimicrobials:  Anti-infectives (From admission, onward)    Start     Dose/Rate Route Frequency Ordered Stop   04/21/22 1315  vancomycin (VANCOCIN) IVPB 1000 mg/200 mL premix  Status:  Discontinued        1,000 mg 200 mL/hr over 60 Minutes Intravenous Every 24 hours 04/21/22 0933 04/27/22 1020   04/19/22 1315  vancomycin (VANCOREADY) IVPB 750 mg/150 mL  Status:  Discontinued        750 mg 150 mL/hr over 60 Minutes Intravenous Every 24 hours 04/19/22 1218 04/21/22 0933   04/18/22 1000  piperacillin-tazobactam (ZOSYN) IVPB 3.375 g  Status:  Discontinued        3.375 g 12.5 mL/hr over 240 Minutes Intravenous Every 8 hours 04/18/22 0925 04/27/22 1020   04/08/22 2200  linezolid (ZYVOX) IVPB 600 mg        600 mg 300 mL/hr over 60 Minutes Intravenous Every 12 hours 04/08/22 1622 04/15/22 2359   04/08/22 0000  ceFAZolin (ANCEF) IVPB 2g/100 mL premix        2 g 200 mL/hr over 30 Minutes Intravenous Every 8 hours 04/07/22 2316 04/08/22 1559   04/07/22 1529  ceFAZolin (ANCEF) IVPB 2g/100 mL premix  Status:  Discontinued        2 g 200 mL/hr over 30 Minutes Intravenous 30 min pre-op 04/07/22 1530 04/07/22 2005   04/06/22 0932  vancomycin (VANCOREADY) IVPB 750 mg/150 mL  Status:  Discontinued        750 mg 150 mL/hr over 60 Minutes Intravenous Every 12 hours 04/06/22 0903 04/08/22 1634   04/05/22 2200  vancomycin (VANCOREADY) IVPB 1250 mg/250 mL  Status:  Discontinued        1,250 mg 166.7 mL/hr over 90 Minutes Intravenous Every 48 hours 04/04/22 1728 04/05/22 1023   04/05/22 1800  vancomycin (VANCOREADY) IVPB 1250 mg/250 mL  Status:  Discontinued         1,250 mg 166.7 mL/hr over 90 Minutes Intravenous Every 24 hours 04/05/22 1023 04/06/22 0903   04/02/22 2200  vancomycin (VANCOREADY) IVPB 750 mg/150 mL  Status:  Discontinued        750 mg 150 mL/hr over 60 Minutes Intravenous Every 12 hours 04/02/22 0853 04/04/22 1728   04/02/22 1000  piperacillin-tazobactam (ZOSYN) IVPB 3.375 g        3.375 g 12.5 mL/hr over 240 Minutes Intravenous Every 8 hours 04/02/22 0828 04/15/22 2359   04/02/22 1000  vancomycin (VANCOREADY) IVPB 1500 mg/300 mL        1,500 mg 150 mL/hr over 120 Minutes Intravenous  Once 04/02/22 0828 04/02/22 1141         Subjective: Sitting up in bed.  No chest pain.  No shortness of breath.  Objective: Vitals:   05/15/22 2348 05/15/22 2349 05/16/22 0358 05/16/22 0851  BP: 124/69 124/69 124/66 (!) 127/49  Pulse: (!) 118 (!) 118 98 (!) 108  Resp: 20 20 18 16   Temp: 98.2 F (36.8 C) 98.2 F (36.8 C) 98.6 F (37 C) 98.2 F (36.8 C)  TempSrc: Oral  Axillary Oral  SpO2: 100%  100% 100%  Weight:      Height:        Intake/Output Summary (Last 24 hours) at 05/16/2022 1115 Last data filed at 05/16/2022 0854 Gross per 24 hour  Intake 270 ml  Output 660 ml  Net -390 ml    Filed Weights   04/09/22 0830 04/22/22 0831 04/30/22 0825  Weight: 68.8 kg 65.8 kg 65.8 kg    Examination:  General exam: NAD. Respiratory system: Lungs clear to auscultation bilaterally.  No wheezes, no crackles, no rhonchi.  Cardiovascular system: Regular rate rhythm no murmurs rubs or gallops.  No JVD.  Gastrointestinal system: Abdomen is soft, nontender, nondistended, positive bowel sounds.  No rebound.  No guarding.  Central nervous system: Alert and oriented. No focal neurological deficits. Extremities: Status post right AKA with incision site c/d/i.  Status post left AKA with incision site c/d/I, with staples intact.  Skin: No rashes, lesions or ulcers Psychiatry: Judgement and insight normal.  Mood and affect appropriate.    Data  Reviewed: I have personally reviewed following labs and imaging studies  CBC: Recent Labs  Lab 05/10/22 0301 05/11/22 0402 05/12/22 0419  WBC 9.4 9.5 10.1  HGB 9.4* 9.0* 8.7*  HCT 29.6* 27.5* 26.5*  MCV 90.2 88.7 89.2  PLT 330 303 283     Basic Metabolic Panel: Recent Labs  Lab 05/10/22 0301 05/11/22 0402 05/12/22 0419  NA 137 137 137  K 4.2 4.2 4.1  CL 103 105 103  CO2 28 27 25   GLUCOSE 111* 111* 111*  BUN 20 21* 17  CREATININE 1.44* 1.32* 1.35*  CALCIUM 8.9 8.7* 8.6*     GFR: Estimated Creatinine Clearance: 56.9 mL/min (A) (by C-G formula based on SCr of 1.35 mg/dL (H)).  Liver Function Tests: Recent Labs  Lab 05/10/22 0301 05/11/22 0402 05/12/22 0419  AST 28 26 21   ALT 32 31 26  ALKPHOS 106 102 105  BILITOT 0.9 0.7 0.5  PROT 7.1 6.9 6.7  ALBUMIN 2.6* 2.5* 2.6*     CBG: No results for input(s): "GLUCAP" in the last 168 hours.    No results found for this or any previous visit (from the past 240 hour(s)).       Radiology Studies: No results found.      Scheduled Meds:  amLODipine  10 mg Oral Daily   aspirin EC  81 mg Oral Daily   atorvastatin  80 mg Oral Daily   carvedilol  6.25 mg Oral BID WC   hydrALAZINE  25 mg Oral BID   nicotine  21 mg Transdermal Daily   pantoprazole  40 mg Oral Daily   sodium chloride flush  3 mL Intravenous Q12H   Continuous Infusions:  sodium chloride 180 mL/hr at 04/20/22 0944   sodium chloride 10 mL/hr at 04/25/22 1616     LOS: 45 days    Time spent: 35 minutes    Ramiro Harvestaniel Hollis Oh, MD [facility]   To contact the attending provider between 7A-7P or the covering provider during after hours 7P-7A, please log into the web site [email or web address] and access using universal [place] password for that web site. If you do not have the password, please call the hospital operator.  05/16/2022, 11:15 AM

## 2022-05-17 ENCOUNTER — Other Ambulatory Visit (HOSPITAL_COMMUNITY): Payer: Self-pay

## 2022-05-17 MED ORDER — AMLODIPINE BESYLATE 10 MG PO TABS
10.0000 mg | ORAL_TABLET | Freq: Every day | ORAL | 1 refills | Status: DC
  Filled 2022-05-17: qty 30, 30d supply, fill #0

## 2022-05-17 MED ORDER — HYDRALAZINE HCL 25 MG PO TABS
25.0000 mg | ORAL_TABLET | Freq: Two times a day (BID) | ORAL | 1 refills | Status: DC
  Filled 2022-05-17: qty 60, 30d supply, fill #0

## 2022-05-17 MED ORDER — NICOTINE 21 MG/24HR TD PT24
21.0000 mg | MEDICATED_PATCH | Freq: Every day | TRANSDERMAL | 0 refills | Status: DC
  Filled 2022-05-17: qty 28, 28d supply, fill #0

## 2022-05-17 MED ORDER — CARVEDILOL 6.25 MG PO TABS
6.2500 mg | ORAL_TABLET | Freq: Two times a day (BID) | ORAL | 1 refills | Status: DC
  Filled 2022-05-17: qty 60, 30d supply, fill #0

## 2022-05-17 MED ORDER — ATORVASTATIN CALCIUM 80 MG PO TABS
80.0000 mg | ORAL_TABLET | Freq: Every day | ORAL | 1 refills | Status: DC
  Filled 2022-05-17: qty 30, 30d supply, fill #0

## 2022-05-17 MED ORDER — GERHARDT'S BUTT CREAM
1.0000 | TOPICAL_CREAM | CUTANEOUS | 0 refills | Status: DC | PRN
  Filled 2022-05-17: qty 1, 30d supply, fill #0

## 2022-05-17 MED ORDER — PANTOPRAZOLE SODIUM 40 MG PO TBEC
40.0000 mg | DELAYED_RELEASE_TABLET | Freq: Every day | ORAL | 1 refills | Status: DC
  Filled 2022-05-17: qty 30, 30d supply, fill #0

## 2022-05-17 NOTE — Plan of Care (Signed)
  Problem: Education: Goal: Knowledge of General Education information will improve Description: Including pain rating scale, medication(s)/side effects and non-pharmacologic comfort measures Outcome: Progressing   Problem: Health Behavior/Discharge Planning: Goal: Ability to manage health-related needs will improve Outcome: Progressing   Problem: Clinical Measurements: Goal: Ability to maintain clinical measurements within normal limits will improve Outcome: Progressing Goal: Will remain free from infection Outcome: Progressing Goal: Respiratory complications will improve Outcome: Progressing Goal: Cardiovascular complication will be avoided Outcome: Progressing   Problem: Activity: Goal: Risk for activity intolerance will decrease Outcome: Progressing   Problem: Elimination: Goal: Will not experience complications related to urinary retention Outcome: Progressing   

## 2022-05-17 NOTE — Progress Notes (Signed)
Pt will be going to sister's home at discharge: [name] [place] [address] Phone: [phone]

## 2022-05-17 NOTE — Progress Notes (Addendum)
  Progress Note    05/17/2022 8:30 AM 17 Days Post-Op  Subjective:  no complaints   Vitals:   05/17/22 0357 05/17/22 0820  BP: 125/62 116/72  Pulse: 100 97  Resp: 17   Temp: 97.9 F (36.6 C)   SpO2: 100%    Physical Exam: Lungs:  non labored Incisions:  B AKA incisions healing well Ext: wound vac R groin with good seal Neurologic: A&O  CBC    Component Value Date/Time   WBC 10.1 05/12/2022 0419   RBC 2.97 (L) 05/12/2022 0419   HGB 8.7 (L) 05/12/2022 0419   HCT 26.5 (L) 05/12/2022 0419   PLT 283 05/12/2022 0419   MCV 89.2 05/12/2022 0419   MCH 29.3 05/12/2022 0419   MCHC 32.8 05/12/2022 0419   RDW 16.7 (H) 05/12/2022 0419   LYMPHSABS 1.8 05/01/2022 0202   MONOABS 1.2 (H) 05/01/2022 0202   EOSABS 0.0 05/01/2022 0202   BASOSABS 0.0 05/01/2022 0202    BMET    Component Value Date/Time   NA 137 05/12/2022 0419   K 4.1 05/12/2022 0419   CL 103 05/12/2022 0419   CO2 25 05/12/2022 0419   GLUCOSE 111 (H) 05/12/2022 0419   BUN 17 05/12/2022 0419   CREATININE 1.35 (H) 05/12/2022 0419   CALCIUM 8.6 (L) 05/12/2022 0419   GFRNONAA >60 05/12/2022 0419   GFRAA >60 01/12/2020 1421    INR    Component Value Date/Time   INR 1.2 04/11/2022 0437     Intake/Output Summary (Last 24 hours) at 05/17/2022 0830 Last data filed at 05/17/2022 0358 Gross per 24 hour  Intake 120 ml  Output 575 ml  Net -455 ml     Assessment/Plan:  51 y.o. male is s/p B AKA 17 Days Post-Op   Vac change R groin with WOC RN this morning B AKA healing well Ok for discharge home with [facility] and home vac once arranged by [facility]    Dagoberto Ligas, PA-C [facility] [phone] 05/17/2022 8:30 AM  I have seen and evaluated the patient. I agree with the PA note as documented above.   Marty Heck, MD [facility] [place] Office: [phone]

## 2022-05-17 NOTE — Progress Notes (Signed)
Physical Therapy Treatment Patient Details Name: Jeffrey Fitzgerald MRN: [id number] DOB: 11/18/1970 Today's Date: 05/17/2022   History of Present Illness Pt is a 51 yo male admitted 04/01/22 from cath lab after aortogram for LLE ischemia. S/p aortobifemoral bypass graft with bilateral femoral endarterectomy on 8/9. Post op RLE ischemia with return to OR same date for redo R groin exploration with thrombectomy and angioplasty. S/p RLE fem-pop bypass graft with 4 compartment fasciotomy on 8/10. S/p thrombectomy left femoral to popliteal BPG wtih closure and right fasciotomy wtih VAC on right LE on 8/11. Ileus with emesis on 8/14. Complicated hospital course. 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 PMH includes HTN, tobacco smoking, PVD.    PT Comments    Pt received supine and agreeable to session with continued focus on transfer training for increased functional independence and safety with mobility. Pt able to complete lateral scoot transfer bed<>chair with sliding board and up to mod assist as pt with increased difficulty scooting "uphill" from low recliner to taller bed. Pt with good use of BUEs to initiate movement and scooting hips needing assist to complete scoot. Pt receptive to education re; importance of continued mobility and activity recommendations. Pt continues to benefit from skilled PT services to progress toward functional mobility goals.    Recommendations for follow up therapy are one component of a multi-disciplinary discharge planning process, led by the attending physician.  Recommendations may be updated based on patient status, additional functional criteria and insurance authorization.  Follow Up Recommendations  Skilled nursing-short term rehab (<3 hours/day) Can patient physically be transported by private vehicle: No   Assistance Recommended at Discharge Intermittent Supervision/Assistance  Patient can return home with the following A lot of help with walking and/or transfers;A lot  of help with bathing/dressing/bathroom;Assistance with cooking/housework;Direct supervision/assist for medications management;Direct supervision/assist for financial management;Assist for transportation;Help with stairs or ramp for entrance   Equipment Recommendations  Wheelchair (measurements PT);Wheelchair cushion (measurements PT);Hospital bed    Recommendations for Other Services       Precautions / Restrictions Precautions Precautions: Fall;Other (comment) Precaution Comments: R groin wound vac Restrictions Weight Bearing Restrictions: Yes RLE Weight Bearing: Non weight bearing LLE Weight Bearing: Non weight bearing     Mobility  Bed Mobility Overal bed mobility: Needs Assistance Bed Mobility: Supine to Sit Rolling: Supervision Sidelying to sit: Min assist Supine to sit: Min assist Sit to supine: Min guard   General bed mobility comments: min assist to come to long sitting in bed and to scoot to sit EOB    Transfers Overall transfer level: Needs assistance Equipment used: Sliding board Transfers: Bed to chair/wheelchair/BSC            Lateral/Scoot Transfers: Min assist, Mod assist General transfer comment: min assist to lateral scoot to chair to simulate transfer to WC, mod assist to scoot chair>bed as bed higher than chair, pt with good push from BUE    Ambulation/Gait                   Stairs             Wheelchair Mobility    Modified Rankin (Stroke Patients Only)       Balance Overall balance assessment: Needs assistance Sitting-balance support: No upper extremity supported Sitting balance-Leahy Scale: Fair Sitting balance - Comments: able to maintain sitting balance in long sitting positon in bed while performing UE HEP and trunk strengthening  Cognition Arousal/Alertness: Awake/alert Behavior During Therapy: Flat affect Overall Cognitive Status: No family/caregiver present to  determine baseline cognitive functioning Area of Impairment: Attention, Following commands, Safety/judgement, Awareness, Problem solving                   Current Attention Level: Sustained Memory: Decreased short-term memory Following Commands: Follows one step commands consistently, Follows one step commands with increased time Safety/Judgement: Decreased awareness of safety, Decreased awareness of deficits   Problem Solving: Requires verbal cues General Comments: increased time to follow directions        Exercises      General Comments        Pertinent Vitals/Pain Pain Assessment Pain Assessment: Faces Faces Pain Scale: Hurts a little bit Pain Location: bottom Pain Descriptors / Indicators: Grimacing, Sore Pain Intervention(s): Monitored during session, Limited activity within patient's tolerance    Home Living                          Prior Function            PT Goals (current goals can now be found in the care plan section) Acute Rehab PT Goals PT Goal Formulation: With patient Time For Goal Achievement: 05/20/22    Frequency    Min 3X/week      PT Plan      Co-evaluation              AM-PAC PT "6 Clicks" Mobility   Outcome Measure  Help needed turning from your back to your side while in a flat bed without using bedrails?: A Little Help needed moving from lying on your back to sitting on the side of a flat bed without using bedrails?: A Lot Help needed moving to and from a bed to a chair (including a wheelchair)?: Total Help needed standing up from a chair using your arms (e.g., wheelchair or bedside chair)?: Total Help needed to walk in hospital room?: Total Help needed climbing 3-5 steps with a railing? : Total 6 Click Score: 9    End of Session Equipment Utilized During Treatment: Other (comment) (sliding board) Activity Tolerance: Patient tolerated treatment well Patient left: in bed;with call bell/phone within  reach Nurse Communication: Mobility status PT Visit Diagnosis: Other abnormalities of gait and mobility (R26.89);Difficulty in walking, not elsewhere classified (R26.2);Muscle weakness (generalized) (M62.81)     Time: [id number] PT Time Calculation (min) (ACUTE ONLY): 15 min  Charges:  $Therapeutic Activity: 8-22 mins                     Yanci Bachtell R. PTA Acute Rehabilitation Services Office: [place] 05/17/2022, 4:34 PM

## 2022-05-17 NOTE — Consult Note (Signed)
[facility] Nurse wound follow up Wound type:surgical wound right groin with NPWT Bilateral AKA.  Staple lines intact.  Measurement: right groin: 9 cm x 5.1 cm x 0.2 cm  Wound bed: Beefy red, moist.  Drainage (amount, consistency, odor) minimal serosanguinous  no odor Periwound: intact  urinary external manager in place. Dressing procedure/placement/frequency: Cleanse wound to right groin with NS and pat dry. Apply black foam. Cover with drape. Change mon.Wed.Fri at 75 mmHg,  Wound is stable and fully granulated.  I am turning this VAC change over to bedside staff while patient is preparing for discharge to home.  Will not follow at this time.  Please re-consult if needed.  Bedside RN to change VAC dressing.  NExt change due 05/19/22 Domenic Moras MSN, RN, FNP-BC [facility] Wound, Ostomy, Continence Nurse Pager [phone]

## 2022-05-17 NOTE — TOC Progression Note (Addendum)
Transition of Care (TOC) - Progression Note  Marvetta Gibbons RN, BSN Transitions of Care Unit 4E- RN Case Manager See Treatment Team for direct phone #    Patient Details  Name: Jeffrey Fitzgerald MRN: [id number] Date of Birth: [birth year]  Transition of Care [facility]) CM/SW Contact  Dahlia Client, Romeo Rabon, RN Phone Number: 05/17/2022, 10:54 AM  Clinical Narrative:    CM spoke with sister [name] this AM via TC- per conversation [name] confirms that she has been able to arrange things at home to make room for hospital bed in the home. [name] has also made arrangements for some private duty assistance 3x week to assist her with bathing, etc. Discussed the [facility] arrangements with [facility] for HHRN/PT/OT to further assist with wound VAC drsg needs and continued therapy.   [name] confirms she is ready to move forward with DME delivery in preparation for her brother to come home.  DME to be arranged under LOG- hospital bed, wheelchair and slide board.  CM is also working on getting home wound VAC approved with [employer] from [place].   Call made to [facility] with [employer] to work on DME needs- Thedore Mins to f/u this afternoon with needed info for LOG- once LOG (letter of guarantee completed) then [employer] will process for delivery to sister's home- address noted in chart- [place], [place] [id number])  [id number]-LOG has been approved for DME per [facility] leadership and CM has spoken with Thedore Mins at [place] who will process for delivery.   Call made to Olivia Mackie w/ [employer]- home wound [facility] pending [employer]. Olivia Mackie to follow up.   Tentatively planning for transition home on Wednesday pending DME delivery and home [facility] approval/delivery. Pt would need wound VAC changed here on Wednesday prior to discharge for a start of care by [facility] in the home on Friday- 9/22.   Pt will transport home via EMS ([facility])   Expected Discharge Plan: [place] Barriers to Discharge: Inadequate or no insurance, SNF Pending bed  offer  Expected Discharge Plan and Services Expected Discharge Plan: [place] In-house Referral: Clinical Social Work Discharge Planning Services: CM Consult Post Acute Care Choice: Durable Medical Equipment, Home Health Living arrangements for the past 2 months: [place]                 DME Arranged: Hospital bed, Other see comment, Wheelchair manual DME Agency: [employer] Date DME Agency Contacted: 05/14/22 Time DME Agency Contacted: [id number] Representative spoke with at DME Agency: Thedore Mins HH Arranged: RN, PT [place] Agency: [place] ([place]) Date [place]: 05/14/22 Time [place]: 41 Representative spoke with at [place]: [place] ([place]) Interventions    Readmission Risk Interventions     No data to display

## 2022-05-18 ENCOUNTER — Other Ambulatory Visit (HOSPITAL_COMMUNITY): Payer: Self-pay

## 2022-05-18 NOTE — Progress Notes (Signed)
PROGRESS NOTE/consult    Jeffrey Fitzgerald  GGE:[id number] DOB: 1971-04-22 DOA: 04/01/2022 PCP: Patient, No Pcp Per    No chief complaint on file.   Brief Narrative:   Jeffrey Fitzgerald is a 51 y.o. male with past medical history of hypertension, peripheral vascular disease, and tobacco abuse who presented initially for critical left lower limb ischemia 8/3 requiring aortobifemoral bypass.  He then underwent left common femoral and posterior tibial bypass on 8/9 same-day for right common revision of the right lower extremity aortobifemoral bypass.  He returned to the OR on 8/11 and underwent thrombectomy of left common femoral to posterior tibial artery bypass, right lower extremity fasciotomy closure, and wound VAC placement.  White blood cell count had trended up to 24.8 and the patient has been temporarily treated empirically with Zosyn.  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  His hospital stay had been complicated by hemorrhagic bleed with hemoglobin trending down to 6 g/dL requiring 2 units of packed red blood cells and creatinine trended up to 3.59 before trending down.    He has been complaining of pain out of left leg which was noted to be cold and contracted.  Vascular had discussed need of left above-knee amputation, but he was not willing to undergo amputation of the left leg.  Palliative care had been consulted.  Patient was noted to become more tachycardic and hypertensive with blood pressures elevated into the 200s at times.  He denies any complaints of any chest pain, nausea, vomiting, or diarrhea.  Patient's white blood cell counts have been otherwise trending down.  TRH consulted to help with medical management.   Assessment & Plan:   Principal Problem:   Critical limb ischemia of  both lower extremities (HCC) Active Problems:   PAD (peripheral artery disease) (HCC)   Acute renal failure (HCC)   Other specified anemias   Pressure injury of skin   SIRS (systemic inflammatory response syndrome) (HCC)   Hypertensive urgency   Hypokalemia   Gastroesophageal reflux disease   Acute postoperative anemia due to expected blood loss  #1 critical limb ischemia -Patient presented with critical limb ischemia bilateral lower extremities required several attempts at revascularization. -Patient subsequently underwent right AKA on 04/22/2022. -Vascular surgery/primary team recommended left AKA however patient initially declined. -Palliative care consulted and following  and spoke with patient and family and patient decided to have left AKA which was done on 04/30/2022 per vascular surgery. -Continue aspirin, statin. -Pain management per primary team. -Continue right lower extremity wound VAC per vascular surgery. -TOC assisting with dispo planning, patient noted to have declined SNF, no insurance for home health with VAC changes. -Per vascular surgery, patient now amenable to SNF. -Disposition Home with home health versus SNF however per primary. -Per vascular surgery.  2.  SIRS -Patient noted to be tachycardic, tachypneic with a leukocytosis white count of 18.5 with noted to be trending down. -Prior blood cultures done with no growth to date. -Patient noted to have been on Zosyn which has subsequently been discontinued. -UA noted moderate hemoglobin, small leukocytes, rare bacteria, 6-10 WBCs.  Patient with no urinary symptoms. -Right lower extremity wound appears to be healing well. -Patient with no respiratory symptoms. -Leukocytosis trended down and have normalized. -Blood cultures with no growth to date x5 days. -Follow.  3.  Hypertensive urgency -Patient noted to have blood pressure elevated 198/86 with heart rates in the 120s. -Likely a component of pain contributing  to hypertensive urgency. -Patient noted to not be on any antihypertensive medications prior to admission. -Patient noted to have been on hydralazine 10 mg twice daily however did not obtain adequate blood pressure control. -Continue Coreg 6.25 mg twice daily, Norvasc 10 mg daily, hydralazine 25 mg twice daily. -Blood pressure currently adequately controlled on current regimen. -Prescriptions have been written and placed in paper chart.  [facility] -Outpatient follow-up with PCP.  4.  Hypokalemia -Repleted.   -Potassium of 4.1 (05/12/2022)  5.  Acute renal failure -Baseline creatinine approximately 1-1.2. -Renal function noted to trend up to 3.59 on 04/11/2022 and felt likely secondary to contrast given. -Renal function improving with creatinine trending down with hydration currently at 1.35. -Outpatient follow-up.  6.  Urinary frequency -Patient noted to have urinary frequency without dysuria. -Urine cultures ordered but have seem to be canceled. -Currently asymptomatic. -No further work-up needed.  7.  Tobacco abuse -Tobacco cessation stressed to patient.   -Nicotine patch.    8.  GERD -Continue PPI  9.  Postop acute blood loss anemia -Hemoglobin stable at 8.7(05/12/2022) -Follow H&H.   -Transfusion threshold hemoglobin < 7.   Patient medically stable, blood pressure stable, prescriptions written for home regimen of antihypertensive medications.  Nothing further to add at this time.  We will sign off.  Please call with questions.   DVT prophylaxis: Per primary team Code Status: Full Family Communication: Updated patient.  No family at bedside. Disposition: To be discharged home tomorrow per patient.  Medically stable from a medicine standpoint for discharge.  No further recommendations to make at this time.  Status is: Inpatient Remains inpatient appropriate because: Severity of illness   Consultants:  Cardiology: Dr. Antoine PocheHochrein  04/01/2022 Wound care RN, Helmut MusterSherry Doty 04/09/2022 Palliative care: Albesa SeenJulia Mcliguham, NP 04/21/2022 [facility] hospitalists: Dr. Katrinka BlazingSmith 04/27/2022  Procedures:  Abdominal films 04/07/2022, 04/12/2022 Chest x-ray 04/07/2022, 04/18/2022 Myoview stress test 04/02/2022 2D echo 04/02/2022 Right AKA per vascular surgery: Dr. Myra GianottiBrabham 04/22/2022 Reexploration and washout of right groin wound less than 30 days postop, sharp excisional debridement of right groin wound, repair of aortobifemoral to profunda femoris artery bypass with pledgeted suture, right groin sartorius muscle flap coverage, application negative pressure dressing per vascular surgery: Dr. Randie Heinzain 04/20/2022 Right leg fasciotomy washout, soft tissue debridement, right groin washout, soft tissue debridement, VAC placement per vascular surgery: Dr. Sherral Hammersobbins 04/20/2022 Thrombectomy of left common femoral to posterior tibial artery bypass, closure of right medial calf fasciotomy, application of negative pressure wound VAC greater right lateral calf fasciotomy per vascular surgery: Dr. Chestine Sporelark 04/09/2022 Right greater saphenous vein harvest, right common femoral to posterior tibial artery bypass with 6 mm PTFE/greater saphenous vein composite graft, right lower extremity angiogram, right lower extremity 4 compartment fasciotomy, right posterior tibial artery thrombectomy per Dr. Lenell AntuHawken, Dr. Chestine Sporelark vascular surgery 04/08/2022 Redo exploration of the right groin/right common femoral artery thrombectomy/extensive endarterectomy of right profunda with bovine pericardial patch angioplasty, revision of right limb of the aortobifemoral bypass graft 04/07/2022 per Dr. Randie Heinzain. Aortobifemoral bypass, right common femoral endarterectomy with profundoplasty, left common femoral endarterectomy with profundoplasty, harvest of left leg great saphenous vein, left common femoral to posterior tibial bypass with composite PTFE and great saphenous vein per vascular surgery: Dr. Chestine Sporelark  04/07/2022 Aortobifemoral bypass, bilateral common femoral and profunda femoris endarterectomies, harvest left greater saphenous vein, left common  femoral to posterior tibial artery bypass with composite 6 mm E PTFE in reverse greater saphenous vein and anatomic tunnel by Dr. Chestine Spore at [facility] vascular surgery 04/07/2022 Attempted ultrasound-guided access left common femoral artery, ultrasound-guided access right common femoral artery, aortogram with catheter selection of the aorta, bilateral lower extremity arteriogram with runoff, 32 minutes monitored moderate conscious sedation time Per vascular surgery: Dr. Chestine Spore 04/01/2022 Left AKA per vascular surgery: Dr. Randie Heinz 04/30/2022  Antimicrobials:  Anti-infectives (From admission, onward)    Start     Dose/Rate Route Frequency Ordered Stop   04/21/22 1315  vancomycin (VANCOCIN) IVPB 1000 mg/200 mL premix  Status:  Discontinued        1,000 mg 200 mL/hr over 60 Minutes Intravenous Every 24 hours 04/21/22 0933 04/27/22 1020   04/19/22 1315  vancomycin (VANCOREADY) IVPB 750 mg/150 mL  Status:  Discontinued        750 mg 150 mL/hr over 60 Minutes Intravenous Every 24 hours 04/19/22 1218 04/21/22 0933   04/18/22 1000  piperacillin-tazobactam (ZOSYN) IVPB 3.375 g  Status:  Discontinued        3.375 g 12.5 mL/hr over 240 Minutes Intravenous Every 8 hours 04/18/22 0925 04/27/22 1020   04/08/22 2200  linezolid (ZYVOX) IVPB 600 mg        600 mg 300 mL/hr over 60 Minutes Intravenous Every 12 hours 04/08/22 1622 04/15/22 2359   04/08/22 0000  ceFAZolin (ANCEF) IVPB 2g/100 mL premix        2 g 200 mL/hr over 30 Minutes Intravenous Every 8 hours 04/07/22 2316 04/08/22 1559   04/07/22 1529  ceFAZolin (ANCEF) IVPB 2g/100 mL premix  Status:  Discontinued        2 g 200 mL/hr over 30 Minutes Intravenous 30 min pre-op 04/07/22 1530 04/07/22 2005   04/06/22 0932  vancomycin (VANCOREADY) IVPB 750 mg/150 mL  Status:  Discontinued        750 mg 150 mL/hr over 60 Minutes  Intravenous Every 12 hours 04/06/22 0903 04/08/22 1634   04/05/22 2200  vancomycin (VANCOREADY) IVPB 1250 mg/250 mL  Status:  Discontinued        1,250 mg 166.7 mL/hr over 90 Minutes Intravenous Every 48 hours 04/04/22 1728 04/05/22 1023   04/05/22 1800  vancomycin (VANCOREADY) IVPB 1250 mg/250 mL  Status:  Discontinued        1,250 mg 166.7 mL/hr over 90 Minutes Intravenous Every 24 hours 04/05/22 1023 04/06/22 0903   04/02/22 2200  vancomycin (VANCOREADY) IVPB 750 mg/150 mL  Status:  Discontinued        750 mg 150 mL/hr over 60 Minutes Intravenous Every 12 hours 04/02/22 0853 04/04/22 1728   04/02/22 1000  piperacillin-tazobactam (ZOSYN) IVPB 3.375 g        3.375 g 12.5 mL/hr over 240 Minutes Intravenous Every 8 hours 04/02/22 0828 04/15/22 2359   04/02/22 1000  vancomycin (VANCOREADY) IVPB 1500 mg/300 mL        1,500 mg 150 mL/hr over 120 Minutes Intravenous  Once 04/02/22 0828 04/02/22 1141         Subjective: Sitting up in bed.  No chest pain.  No shortness of breath.  States he is ready to go home.  States he will be discharged tomorrow home.    Objective: Vitals:   05/18/22 0852 05/18/22 0926 05/18/22 1128 05/18/22 1356  BP: 138/79 138/79 107/77 120/62  Pulse: 97  99 99  Resp: 18  18 17   Temp: 98.3 F (36.8 C)  98.5  F (36.9 C) 98.5 F (36.9 C)  TempSrc: Oral  Oral   SpO2:   100%   Weight:      Height:        Intake/Output Summary (Last 24 hours) at 05/18/2022 1718 Last data filed at 05/18/2022 1544 Gross per 24 hour  Intake 3 ml  Output 345 ml  Net -342 ml    Filed Weights   04/09/22 0830 04/22/22 0831 04/30/22 0825  Weight: 68.8 kg 65.8 kg 65.8 kg    Examination:  General exam: NAD. Respiratory system: CTA B.  No wheezes, no crackles, no rhonchi.  Fair air movement.  Cardiovascular system: RRR no murmurs rubs or gallops.  No JVD.   Gastrointestinal system: Abdomen is soft, nontender, nondistended, positive bowel sounds.  No rebound.  No guarding.    Central nervous system: Alert and oriented. No focal neurological deficits. Extremities: Status post right AKA with incision site c/d/i.  Status post left AKA with incision site c/d/I, with staples intact.  Skin: No rashes, lesions or ulcers Psychiatry: Judgement and insight normal.  Mood and affect appropriate.    Data Reviewed: I have personally reviewed following labs and imaging studies  CBC: Recent Labs  Lab 05/12/22 0419  WBC 10.1  HGB 8.7*  HCT 26.5*  MCV 89.2  PLT 283     Basic Metabolic Panel: Recent Labs  Lab 05/12/22 0419  NA 137  K 4.1  CL 103  CO2 25  GLUCOSE 111*  BUN 17  CREATININE 1.35*  CALCIUM 8.6*     GFR: Estimated Creatinine Clearance: 56.9 mL/min (A) (by C-G formula based on SCr of 1.35 mg/dL (H)).  Liver Function Tests: Recent Labs  Lab 05/12/22 0419  AST 21  ALT 26  ALKPHOS 105  BILITOT 0.5  PROT 6.7  ALBUMIN 2.6*     CBG: No results for input(s): "GLUCAP" in the last 168 hours.    No results found for this or any previous visit (from the past 240 hour(s)).       Radiology Studies: No results found.      Scheduled Meds:  amLODipine  10 mg Oral Daily   aspirin EC  81 mg Oral Daily   atorvastatin  80 mg Oral Daily   carvedilol  6.25 mg Oral BID WC   hydrALAZINE  25 mg Oral BID   nicotine  21 mg Transdermal Daily   pantoprazole  40 mg Oral Daily   sodium chloride flush  3 mL Intravenous Q12H   Continuous Infusions:  sodium chloride 180 mL/hr at 04/20/22 0944   sodium chloride 10 mL/hr at 04/25/22 1616     LOS: 47 days    Time spent: 35 minutes    Irine Seal, MD [facility]   To contact the attending provider between 7A-7P or the covering provider during after hours 7P-7A, please log into the web site [email or web address] and access using universal [place] password for that web site. If you do not have the password, please call the hospital operator.  05/18/2022, 5:18 PM

## 2022-05-18 NOTE — Progress Notes (Addendum)
Occupational Therapy Treatment Patient Details Name: Jeffrey Fitzgerald MRN: [id number] DOB: 1971/05/15 Today's Date: 05/18/2022   History of present illness Pt is a 51 yo male admitted 04/01/22 from cath lab after aortogram for LLE ischemia. S/p aortobifemoral bypass graft with bilateral femoral endarterectomy on 8/9. Post op RLE ischemia with return to OR same date for redo R groin exploration with thrombectomy and angioplasty. S/p RLE fem-pop bypass graft with 4 compartment fasciotomy on 8/10. S/p thrombectomy left femoral to popliteal BPG wtih closure and right fasciotomy wtih VAC on right LE on 8/11. Ileus with emesis on 8/14. Complicated hospital course. 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 PMH includes HTN, tobacco smoking, PVD.   OT comments  Pt supine in bed and agreeable to OT.  Focused on ADL preparation for dc home.  In regards to toileting, pt reports plan to use urinal but no plan for BM (reports has not been using bed pan here).  Discussed progression to 3:1 drop arm commode, but currently safer to use bed pan--today requires mod assist to roll and correctly position.  Pt reports sister will be able to assist.  Therapist confirmed with case manager, pts sister will be available as she works at home.Educate pt on importance of using bed pan and telling his sister when he needs help to prevent wound infection; pt voiced understanding. Discussed basin bathing from bed level, rolling for LB dressing and long sitting for UB dressing.  Continues to require min assist for LB dressing (difficulty managing clothing in sidelying), and mod assist to ascend into long sitting.  He demonstrates poor awareness, problem solving throughout session; reports eager to go home. Pt will benefit from continued OT acutely to prepare for dc home tomorrow.    Recommendations for follow up therapy are one component of a multi-disciplinary discharge planning process, led by the attending physician.  Recommendations may be  updated based on patient status, additional functional criteria and insurance authorization.    Follow Up Recommendations  Skilled nursing-short term rehab (<3 hours/day)    Assistance Recommended at Discharge Frequent or constant Supervision/Assistance  Patient can return home with the following  A lot of help with bathing/dressing/bathroom;Direct supervision/assist for medications management;Assist for transportation;Help with stairs or ramp for entrance;A lot of help with walking and/or transfers;Assistance with cooking/housework   Equipment Recommendations  Hospital bed;Wheelchair (measurements OT);Wheelchair cushion (measurements OT);BSC/3in1 (drop arm bsc)    Recommendations for Other Services      Precautions / Restrictions Precautions Precautions: Fall;Other (comment) Precaution Comments: R groin wound vac Restrictions Weight Bearing Restrictions: Yes RLE Weight Bearing: Non weight bearing LLE Weight Bearing: Non weight bearing       Mobility Bed Mobility Overal bed mobility: Needs Assistance Bed Mobility: Supine to Sit, Rolling Rolling: Min guard   Supine to sit: Mod assist     General bed mobility comments: min guard to fully roll in bed for use of bed pan, mod assist for trunk support to ascend from supine (attempted pulling on B rails but unsucessful)    Transfers                         Balance Overall balance assessment: Needs assistance Sitting-balance support: No upper extremity supported Sitting balance-Leahy Scale: Fair Sitting balance - Comments: statically upright in bed without back support min guard  ADL either performed or assessed with clinical judgement   ADL Overall ADL's : Needs assistance/impaired                 Upper Body Dressing : Min guard;Sitting   Lower Body Dressing: Minimal assistance;Bed level Lower Body Dressing Details (indicate cue type and reason): pt with  boxers over AKA but not up over hips upon entry; min cueing and assist to problem solve technique. Difficulty maintaining sidelying position while attempting to pull up over hips.   Toilet Transfer Details (indicate cue type and reason): unable to complete due to weakness/fatigue, reviewed plan for home.  Practiced use of bed pan with mod assist to postion correctly.   Toileting - Clothing Manipulation Details (indicate cue type and reason): would require total assist in sidelying, difficulty maintaining sidelying and completing functional task     Functional mobility during ADLs: Moderate assistance General ADL Comments: focused on ADLs in prep for dc home, discussed basin bathing in bed and having assist for using bedpan at home for toileting needs.    Extremity/Trunk Assessment              Vision       Perception     Praxis      Cognition Arousal/Alertness: Awake/alert Behavior During Therapy: Flat affect Overall Cognitive Status: No family/caregiver present to determine baseline cognitive functioning Area of Impairment: Attention, Following commands, Safety/judgement, Awareness, Problem solving, Memory                   Current Attention Level: Sustained Memory: Decreased short-term memory Following Commands: Follows one step commands consistently, Follows one step commands with increased time Safety/Judgement: Decreased awareness of safety, Decreased awareness of deficits Awareness: Emergent Problem Solving: Requires verbal cues, Slow processing, Difficulty sequencing General Comments: pt requires increased time to follow commands, poor problem sovling and awareness to self care needs.  Step by step cueing to sequence managing boxers on over hips. Decreased attention and recall to complete tasks during session.        Exercises      Shoulder Instructions       General Comments discussed plan for ADLs at home, pt reports using urinal but when questioned  about BM needs pt voiced "I don't know".  I reports not using bed pan or commode here, discussed options.  Spoke to CM- reports his sister works from home and can be available to assist.    Pertinent Vitals/ Pain       Pain Assessment Pain Assessment: Faces Faces Pain Scale: No hurt Pain Intervention(s): Monitored during session  Home Living                                          Prior Functioning/Environment              Frequency  Min 2X/week        Progress Toward Goals  OT Goals(current goals can now be found in the care plan section)  Progress towards OT goals: Progressing toward goals  Acute Rehab OT Goals Patient Stated Goal: get home OT Goal Formulation: With patient Time For Goal Achievement: 05/31/22 Potential to Achieve Goals: Fair ADL Goals Pt Will Perform Lower Body Bathing: bed level;sitting/lateral leans;with min guard assist Pt Will Perform Lower Body Dressing: with min guard assist;sitting/lateral leans;bed level Pt Will Transfer to Toilet: with mod assist;bedside commode  Pt/caregiver will Perform Home Exercise Program: Increased strength;Both right and left upper extremity;With written HEP provided;With Supervision Additional ADL Goal #1: Pt will complete bed mobility with independence, demonstrating long sitting with no more than min assist for ADLs.  Plan Discharge plan remains appropriate;Frequency needs to be updated    Co-evaluation                 AM-PAC OT "6 Clicks" Daily Activity     Outcome Measure   Help from another person eating meals?: None Help from another person taking care of personal grooming?: A Little Help from another person toileting, which includes using toliet, bedpan, or urinal?: A Lot Help from another person bathing (including washing, rinsing, drying)?: A Lot Help from another person to put on and taking off regular upper body clothing?: A Little Help from another person to put on and taking  off regular lower body clothing?: A Lot 6 Click Score: 16    End of Session    OT Visit Diagnosis: Unsteadiness on feet (R26.81);Other abnormalities of gait and mobility (R26.89);Muscle weakness (generalized) (M62.81);Other symptoms and signs involving cognitive function   Activity Tolerance Patient tolerated treatment well   Patient Left in bed;with call bell/phone within reach;with bed alarm set   Nurse Communication Mobility status        Time: 9629-5284 OT Time Calculation (min): 17 min  Charges: OT General Charges $OT Visit: 1 Visit OT Treatments $Self Care/Home Management : 8-22 mins  Barry Brunner, OT Acute Rehabilitation Services Office [phone]   Chancy Milroy 05/18/2022, 2:13 PM

## 2022-05-18 NOTE — Discharge Summary (Signed)
Open Aortic Surgery Discharge Summary    Jeffrey Fitzgerald [id number] 51 y.o. male  [id number]  Admission Date: 04/01/2022  Discharge Date: 05/19/2022  Physician: Marty Heck, MD  Admission Diagnosis: Critical limb ischemia of both lower extremities ([place]) [I70.223] PAD (peripheral artery disease) ([place]) [I73.9]   HPI:   This is a 51 y.o. male  with history of hypertension that presents for evaluation of critical limb ischemia of the left lower extremity with tissue loss.  Patient states he has had pain in his left foot for the last 2 years.  He cannot sleep at night.  States he really has had difficult time even moving his foot for several years.  He has since developed a wound on the left lateral ankle as well as the left fourth toe.  He states this was about a month ago when he fell out of his scooter.  No previous abdominal surgery.  States he smokes half a pack of cigarettes a day for the last 30 years.  Denies other illicit drug use.  He denies any problems with his right lower extremity  Hospital Course:  The patient was admitted to the hospital and underwent: 04/07/22  Procedure: 1.  Aortobifemoral bypass (16 mm x 8 mm bifurcated dacron graft) 2.  Right common femoral endarterectomy with profundoplasty 3.  Left common femoral endarterectomy with profundoplasty 4.  Harvest of left leg great saphenous vein 5.  Left common femoral to posterior tibial bypass with composite PTFE and great saphenous vein  OPERATIVE FINDINGS: 16 x 8 mm aortobifemoral bypass.  Bilateral common femoral endarterectomy and profundoplasty.  Good Doppler flow in both profunda femoris arteries at completion.  Unremarkable common femoral to left posterior tibial artery bypass with composite graft.  Good Doppler signal in the left foot at completion.  No Doppler flow in the right foot at completion.   04/07/22 PM  In the PACU, he had right foot pain and numbness.  We could not find doppler signals.  We  elected to take him back to the operating room.  Procedure: 1.  Redo exploration of right groin 2.  Right common femoral artery thrombectomy 3.  Extensive endarterectomy of the right profunda with bovine pericardial patch angioplasty 4.  Revision of right limb of the aortobifemoral bypass graft  Findings: There was acute thrombus in the right common femoral artery with thrombus in the two main profunda branches.  There was no backbleeding from either profunda branch.  The common femoral was then thrombectomized.  We then opened the larger profunda branch about 12 to 15 cm and really got no backbleeding and the vessel appears chronically occluded.  The other profunda branch we opened for about 5 cm and found a lumen with backbleeding.  This is a heavily diseased vessel and performed bovine patch and then revised the right limb of the aortobifemoral bypass onto the patent profunda branch.   04/08/22 pt with RLE acute limb ischemia after ABFBPG and thrombectomy of right limb and CFA.  He underwent: PROCEDURE:   1) right greater saphenous vein harvest 2) right common femoral to posterior tibial artery bypass with 6 mm PTFE/greater saphenous vein composite graft 3) right lower extremity angiogram 4) right lower extremity 4 compartment Fasciotomy 5) right posterior tibial artery thrombectomy  OPERATIVE FINDINGS:  Right groin reopened.  Good Doppler flow confirmed in the profunda femoris artery Right posterior tibial artery exposed through right medial mid calf incision.  The posterior tibial artery appeared healthy here. Unremarkable CFA to PT  bypass in anatomic tunnel with composite graft Good Doppler flow at completion in the bypass graft.  No Doppler flow noted in the foot.   On table angiogram performed.  No outflow noted through the bypass graft. Right calf 4 compartment fasciotomy showed gross bulging of all 4 compartments.  No fasciculation was noted in the anterior or deep posterior  compartments. Still no Doppler flow noted in the foot.  We open the bypass graft hood and passed arterial dilators through the anastomosis confirming its patency.  Some backbleeding was noted.  A Doppler signal returned in the foot. I tried to loosely closed the medial calf incision over the bypass graft and lost signal in the foot.  The calf incisions were reopened.  There was still no Doppler flow in the foot I cut down on the right posterior tibial artery at the foot.  A small arteriotomy was made and #2 Fogarty embolectomy balloon catheter was passed proximally with return of torrential inflow.  No thrombus was returned.  I passed this into the foot and did not return any thrombus.  There was minimal backbleeding.  Doppler flow was confirmed in the foot. The cutdown was closed.  The calf fasciotomies were covered with a wet-to-dry dressing.  The groin incision was closed with staples and nylon's.   04/09/22 51 year old male who is now postop day 2 status post aortobifemoral bypass with bilateral femoral endarterectomies including profundoplasty with known chronic SFA occlusion bilaterally and a left femoral to PT bypass for critical limb ischemia with tissue loss.  He has had a complicated postop course including thrombectomy of the right common femoral artery with revision of the right limb of the aortobifemoral bypass and also right common femoral to PT bypass for acute on chronic limb ischemia and right leg fasciotomies.  This morning he had no signal in the left foot and we suspect his bypass is occluded.  He presents for left bypass thrombectomy and also discussed trying to close his right leg fasciotomies after risk benefits discussed.  He remains high risk for limb loss. Procedure: 1.  Thrombectomy of left common femoral to posterior tibial artery bypass 2.  Closure of right medial calf fasciotomy 3.  Application of negative pressure wound VAC greater right lateral calf fasciotomy   Findings:  The left medial calf incision was reopened and there was no pulse in the bypass.  Ultimately a #4 Fogarty was used to thrombectomized the PTFE segment of the bypass with excellent pulsatile inflow.  I then passed the #2 and #3 Fogarty distally into the vein graft and PT artery and got some acute thrombus as well.  I then passed a #2.5 and #3 coronary dilators down the posterior tibial into the ankle without any significant resistance.  I shortened the bypass and performed new end to end primary anastomosis of the PTFE and vein graft.  Excellent PT signal at completion.  The right medial fasciotomies were closed with nylon's.  The right lateral fasciotomies were covered with a wound VAC.  Excellent right PT signal after wound closure.    04/20/22 51 y.o. male who has undergone significant surgical reconstruction over the last month including aortobifemoral bypass, bilateral femoral to posterior tibial artery bypasses.  Patient continues to have excellent signals in the feet, however he has had increased pain in the right leg, specifically at the fasciotomy sites.  Leukocytosis continues to increase.  After discussing the risk and benefits.  Pt underwent: Right leg fasciotomy washout, soft tissue debridement Right  groin washout, soft tissue debridement, VAC placement   Findings:  Liquefied necrosis of muscle and the posterior compartments of the right leg Muscle necrosis in the anterior and lateral compartments Devitalized skin and adipose layer in the right groin.   04/20/22 PM  51 year old male initially underwent aortobifemoral bypass and subsequent bilateral femoral to below-knee bypasses with PTFE and is also required fasciotomies.  Today he underwent debridement of his right groin wound and was noted to have significant bleeding this evening and is now indicated for operative exploration. There was significant tissue necrosis underneath the intact suture line which was all debridement back to healthy  appearing tissue using sharp excisional debridement with scissors and cautery.  There was bleeding from the lateral aspect of the toe of the aortobifemoral bypass graft onto the profunda femoris artery which was repaired with a pledgeted suture.  After debridement there was a very large wound and this was covered with sartorius muscle flap which did appear healthy and was reactive to cautery.  The wound was hemostatic and the wound VAC was placed.  04/20/2022: Procedure Performed: 1.  Reexploration and washout of right groin wound less than 30 days postoperatively 2.  Sharp excisional debridement of right groin wound to 14 x 9 x 4 cm 3.  Repair of aortobifemoral to profunda femoris artery bypass with pledgeted suture 4.  Right groin sartorius muscle flap coverage 5.  Application negative pressure dressing  Findings: There was significant tissue necrosis underneath the intact suture line which was all debridement back to healthy appearing tissue using sharp excisional debridement with scissors and cautery.  There was bleeding from the lateral aspect of the toe of the aortobifemoral bypass graft onto the profunda femoris artery which was repaired with a pledgeted suture.  After debridement there was a very large wound and this was covered with sartorius muscle flap which did appear healthy and was reactive to cautery.  The wound was hemostatic and the wound VAC was placed.   04/22/22 - pt with ischemic RLE Procedure:   #1: Right above-knee amputation                       #2: Washout of right femoral incision with placement of myriad powder and exchange of wound VAC (12 x 9 x 2 cm)  Findings: Viable muscle at the amputation site.  The Gore-Tex graft was removed from the right leg.  It was ligated in the groin leaving approximately 1-1/2 cm of Gore-Tex.  The muscle flap was situated over the graft with no active bleeding.  Myriad powder was placed in the wound, and the wound VAC was replaced.   Pt had  nonviable LLE with occluded bypass and underwent left above knee amputation on 04/30/2022.  Findings: Bypass graft was occluded and was divided and excluded from the wound.  All tissue did have good capillary bleeding to suggest adequate wound healing potential.  Remainder of hospitalization consisted of wound vac changes and attempting placement as well as home vac and HH.   His renal function is stable at discharge as well as H&H.  Per hospitalist, hospital course with following assessment: Principal Problem:   Critical limb ischemia of both lower extremities ([place]) Active Problems:   PAD (peripheral artery disease) ([place])   Acute renal failure ([place])   Other specified anemias   Pressure injury of skin   SIRS (systemic inflammatory response syndrome) ([facility])   Hypertensive urgency   Hypokalemia   Gastroesophageal reflux disease  Acute postoperative anemia due to expected blood loss   #1 critical limb ischemia -Patient presented with critical limb ischemia bilateral lower extremities required several attempts at revascularization. -Patient subsequently underwent right AKA on 04/22/2022. -Vascular surgery/primary team recommended left AKA however patient initially declined. -Palliative care consulted and following  and spoke with patient and family and patient decided to have left AKA which was done on 04/30/2022 per vascular surgery. -Continue aspirin, statin. -Pain management per primary team. -Continue right lower extremity wound VAC per vascular surgery. -TOC assisting with dispo planning, patient noted to have declined SNF, no insurance for home health with VAC changes. -Per vascular surgery, patient now amenable to SNF. -Disposition Home with home health versus SNF however per primary. -Per vascular surgery.   2.  SIRS -Patient noted to be tachycardic, tachypneic with a leukocytosis white count of 18.5 with noted to be trending down. -Prior blood cultures done with no growth to  date. -Patient noted to have been on Zosyn which has subsequently been discontinued. -UA noted moderate hemoglobin, small leukocytes, rare bacteria, 6-10 WBCs.  Patient with no urinary symptoms. -Right lower extremity wound appears to be healing well. -Patient with no respiratory symptoms. -Leukocytosis trended down and have normalized. -Blood cultures with no growth to date x5 days. -Follow.   3.  Hypertensive urgency -Patient noted to have blood pressure elevated 198/86 with heart rates in the 120s. -Likely a component of pain contributing to hypertensive urgency. -Patient noted to not be on any antihypertensive medications prior to admission. -Patient noted to have been on hydralazine 10 mg twice daily however did not obtain adequate blood pressure control. -Continue Coreg 6.25 mg twice daily, Norvasc 10 mg daily, hydralazine 25 mg twice daily. -Uptitrate antihypertensive medications for better blood pressure control if needed. -We will need outpatient follow-up with PCP on discharge.   4.  Hypokalemia -Repleted.   -Potassium of 4.1 (05/12/2022)   5.  Acute renal failure -Baseline creatinine approximately 1-1.2. -Renal function noted to trend up to 3.59 on 04/11/2022 and felt likely secondary to contrast given. -Renal function improving with creatinine trending down with hydration currently at 1.35. -Urine output of 650 cc recorded over the past 24 hours.  -Follow.   6.  Urinary frequency -Patient noted to have urinary frequency without dysuria. -Urine cultures ordered but have seem to be canceled. -Currently asymptomatic. -No further work-up needed.   7.  Tobacco abuse -Tobacco cessation.   -Nicotine patch.     8.  GERD -PPI.     9.  Postop acute blood loss anemia -Hemoglobin stable at 8.7(05/12/2022) -Follow H&H.   -Transfusion threshold hemoglobin < 7.   CBC    Component Value Date/Time   WBC 10.1 05/12/2022 0419   RBC 2.97 (L) 05/12/2022 0419   HGB 8.7 (L)  05/12/2022 0419   HCT 26.5 (L) 05/12/2022 0419   PLT 283 05/12/2022 0419   MCV 89.2 05/12/2022 0419   MCH 29.3 05/12/2022 0419   MCHC 32.8 05/12/2022 0419   RDW 16.7 (H) 05/12/2022 0419   LYMPHSABS 1.8 05/01/2022 0202   MONOABS 1.2 (H) 05/01/2022 0202   EOSABS 0.0 05/01/2022 0202   BASOSABS 0.0 05/01/2022 0202    BMET    Component Value Date/Time   NA 137 05/12/2022 0419   K 4.1 05/12/2022 0419   CL 103 05/12/2022 0419   CO2 25 05/12/2022 0419   GLUCOSE 111 (H) 05/12/2022 0419   BUN 17 05/12/2022 0419   CREATININE 1.35 (H) 05/12/2022 0419  CALCIUM 8.6 (L) 05/12/2022 0419   GFRNONAA >60 05/12/2022 0419   GFRAA >60 01/12/2020 1421       Discharge Diagnosis:  Critical limb ischemia of both lower extremities ([facility]) [I70.223] PAD (peripheral artery disease) ([place]) [I73.9]  Secondary Diagnosis: Patient Active Problem List   Diagnosis Date Noted   Acute postoperative anemia due to expected blood loss    Gastroesophageal reflux disease    SIRS (systemic inflammatory response syndrome) ([place]) 04/27/2022   Hypertensive urgency 04/27/2022   Hypokalemia 04/27/2022   Pressure injury of skin 04/13/2022   Acute renal failure [facility])    Other specified anemias    PAD (peripheral artery disease) ([place]) 04/07/2022   Critical limb ischemia of both lower extremities ([place]) 04/01/2022   Critical limb ischemia of left lower extremity with ulceration of ankle ([place]) 03/30/2022   Iliac artery occlusion, 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   amLODipine 10 MG tablet Commonly known as: NORVASC Take 1 tablet (10 mg total) by mouth daily.   aspirin EC 81 MG tablet Take 1 tablet (81 mg total) by mouth daily. Swallow whole.   atorvastatin 80 MG tablet Commonly known as: LIPITOR Take 1 tablet (80 mg total) by mouth daily.   carvedilol 6.25 MG tablet Commonly known as: COREG Take 1 tablet (6.25 mg total) by mouth 2 (two) times daily with a meal.   Gerhardt's butt cream Crea Apply 1 Application topically as needed for irritation (apply to periarea as needed for excoriation).   hydrALAZINE 25 MG tablet Commonly known as: APRESOLINE Take 1 tablet (25 mg total) by mouth 2 (two) times daily.   nicotine 21 mg/24hr patch Commonly known as: NICODERM CQ - dosed in mg/24 hours Place 1 patch (21 mg total) onto the skin daily.   pantoprazole 40 MG tablet Commonly known as: PROTONIX Take 1 tablet (40 mg total) by mouth daily.               Durable Medical Equipment  (From admission, onward)           Start     Ordered   05/14/22 1417  For home use only DME Other see comment  Once       Comments: Sliding board 30 in  Question:  Length of Need  Answer:  Lifetime   05/14/22 1416   05/14/22 1417  For home use only DME Hospital bed  Once       Question Answer Comment  Length of Need Lifetime   Patient has (list medical condition): Bilateral AKA   The above medical condition requires: Patient requires the ability to reposition frequently   Head must be elevated greater than: 30 degrees   Bed type Semi-electric   Support Surface: Gel Overlay      05/14/22 1418   05/05/22 0757  For home use only DME standard manual wheelchair with seat cushion  Once       Comments: Patient suffers from bilateral above knee amputations which impairs their ability to perform daily activities like bathing, dressing, grooming, and toileting in the home.  A cane, crutch, or walker will not resolve issue with  performing activities of daily living. A wheelchair will allow patient to safely perform daily activities. Patient can safely propel the wheelchair in the home or has a caregiver who can provide assistance. Length of need Lifetime. Accessories: elevating leg rests (ELRs), wheel locks, extensions and anti-tippers.   05/05/22 0800   05/05/22 0755  For home use only DME Negative pressure wound device  Once       Question Answer Comment  Frequency of dressing change 3 times per week   Length of need 3 Months   Dressing type Foam   Amount of suction 75 mm/Hg   Pressure application Continuous pressure   Supplies 10 canisters and 15 dressings per month for duration of therapy      05/05/22 0800            Instructions:  [facility] Discharge Instructions   Open Aortic Surgery  Please refer to the following instructions for your post-procedure care. Your surgeon or Physician Assistant will discuss any changes with you.  Activity  Avoid lifting more than eight pounds (a gallon of milk) until after your first post-operative visit. You are encouraged to walk as much as you can. You can slowly return to normal activities but must avoid strenuous activity and heavy lifting until your doctor tells you it's okay. Heavy lifting can hurt the incision and cause a hernia. Avoid activities such as vacuuming or swinging a golf club. It is normal to feel tired for several weeks after your surgery. Do not drive until your doctor gives the okay and you are no longer taking prescription pain medications. It is also normal to have difficulty with sleep habits, eating and bowl movements after surgery. These will go away with time.  Bathing/Showering  Shower daily after you go home. Do not soak in a bathtub, hot tub, or swim until the incision heals.  Incision Care  Shower every day. Clean your incision with mild soap and water. Pat the area dry with a clean towel. You do  not need a bandage unless otherwise instructed. Do not apply any ointments or creams to your incision. You may have skin glue on your incision. Do not peel it off. It will come off on its own in about one week. If you have staples or sutures along your incision, they will be removed at your post op appointment.  If you have groin incisions, wash the groin wounds with soap and water daily and pat dry. (No tub bath-only shower)  Then put a dry gauze or washcloth in the groin to keep this area dry to help prevent wound infection.  Do this daily and as needed.  Do not use Vaseline or neosporin on your incisions.  Only use soap and water on your incisions and then protect and keep dry.  Diet  Resume your normal diet. There are no special food restriction following this procedure. A low fat/low cholesterol diet is recommended for all patients with vascular disease. After your aortic surgery, it's normal to feel full faster than usual and to not feel as hungry as you normally would. You will probably lose weight initially following your surgery. It's best to eat small, frequent meals over the course of the  day. Call the office if you find that you are unable to eat even small meals.   In order to heal from your surgery, it is CRITICAL to get adequate nutrition. Your body requires vitamins, minerals, and protein. Vegetables are the best source of vitamins and minerals.  If you have pain, you may take over-the-counter pain reliever such as acetaminophen (Tylenol). If you were prescribed a stronger pain medication, please be aware these medication can cause nausea and constipation. Prevent nausea by taking the medication with a snack or meal. Avoid constipation by drinking plenty of fluids and eating foods with a high amount of fiber, such as fruits, vegetables and grains. Take 120m of the over-the-counter stool softener Colace twice a day as needed to help with constipation. A laxative, such as Milk of Magnesia,  may be recommended for you at this time. Do not take a laxative unless your surgeon or P.A. tells you it's OK.  Do not take Tylenol if you are taking stronger pain medications (such as Percocet).  Follow Up  Our office will schedule a follow up appointment 2-3 weeks after discharge.  Please call [place] for any of the following conditions    .     Severe or worsening pain in your legs or feet or in your abdomen back or chest. Increased pain, redness drainage (pus) from your incision site. Increased abdominal pain, bloating, nausea, vomiting, or persistent diarrhea. Fever of 101 degrees or higher. Swelling in your leg (s).  Reduce your risk of vascular disease  Stop smoking. If you would like help, call [facility] at 1-800-QUIT-NOW (571 798 6420 or [facility] 3(319)841-7703 Manage your cholesterol Maintain a desired weight Control your diabetes Keep your blood pressure down  If you have any questions please call the office at [phone]   Prescriptions given:  Norvasc 111mdaily 2.  Asa  8141maily 3.  Lipitor 53m77mily 4.  Coreg 6.25mg14m 5.  Gerhardt's butt cream 6.  Hydralazine 25mg 63m7.  Nicotine 21mg p53m 8.  Protonix 40mg da57m Disposition: home with HH  [facility]  Follow up: 1. [place]. [place]   [facility] [phone]  7:28 AM    Post-op:  Leg ischemia: Yes, no Surgery needed, [ ]  Yes, Surgery needed, [ x] Amputation Bowel ischemia: No, [ ]  Medical Rx, [ ]  Surgical Rx Wound complication: Yes, [ ]  Superficial separation/infection, [ x] Return to OR Return to OR: Yes  Return to OR for bleeding: No Stroke: No, [ ]  Minor, [ ]  Major  Discharge medications: Statin use:  Yes  ASA use:  Yes   Plavix use:  No  Beta blocker use:  Yes  ACEI use:  No ARB use:  No CCB use:  Yes Coumadin use:  No

## 2022-05-18 NOTE — TOC Transition Note (Signed)
Transition of Care (TOC) - CM/SW Discharge Note Marvetta Gibbons RN, BSN Transitions of Care Unit 4E- RN Case Manager See Treatment Team for direct phone #    Patient Details  Name: Jeffrey Fitzgerald MRN: [id number] Date of Birth: 01-07-1971  Transition of Care [facility]) CM/SW Contact:  Dawayne Patricia, RN Phone Number: 05/18/2022, 4:16 PM   Clinical Narrative:    CM received call from sister [name]- DME- hospital bed, wheelchair and slide board have been delivered to sister's home. Sister is prepared for pt to return home- anticipate tomorrow after wound VAC drsg change here in the am.   [employer] has notified this am as well that pt has been approved for home wound VAC [facility])- await POD for [facility] delivery.   CM confirmed with [facility] that [facility] services will do a start of care visit for this Friday 05/21/22 for wound VAC drsg change- HHRN/PT/OT as been arranged with [facility] under [facility].   Sister reporting that pt's belongings still missing (clothing, ID/wallet, as well as Publishing copy) CM checked in pt's room- no belongs found- however battery scooter is on scooter- it's the charging cord for battery that is missing. Sister updated on this, states family will be coming this evening with Lucianne Lei to assist in getting scooter home.  Bedside RN aware and will f/u on missing items.   1600- CM received POD from [facility] for home VAC- home ready VAC has been delivered to the room along with home supplies for drsg changes- paperwork signed by patient and faxed back to [facility]- copies placed on shadow chart.   HH and DME in place for pt to discharge by EMS tomorrow after wound VAC drsg changed here.  TOC will continue to follow.    Final next level of care: [place] Barriers to Discharge: Barriers Resolved   Patient Goals and CMS Choice Patient states their goals for this hospitalization and ongoing recovery are:: home if unable to secure SNF bed for rehab CMS  Medicare.gov Compare Post Acute Care list provided to:: Patient Choice offered to / list presented to : Patient, Sibling  Discharge Placement            Home w/ [facility]           Discharge Plan and Services In-house Referral: Clinical Social Work Discharge Planning Services: Follow-up appt scheduled, MATCH Program, Medication Assistance, [facility] Post Acute Care Choice: Durable Medical Equipment, Home Health          DME Arranged: Hospital bed, Other see comment, Wheelchair manual DME Agency: [employer] Date DME Agency Contacted: 05/14/22 Time DME Agency Contacted: [phone] Representative spoke with at DME Agency: Thedore Mins HH Arranged: RN, PT [facility] Agency: [place] ([place]) Date [place]: 05/14/22 Time [place]: 38 Representative spoke with at [place]: [place] ([place]) Interventions     Readmission Risk Interventions    05/18/2022    4:16 PM  Readmission Risk Prevention Plan  Post Dischage Appt Complete  Medication Screening Complete  Transportation Screening Complete

## 2022-05-18 NOTE — Progress Notes (Signed)
[facility] [place]  Subjective  -no complaints.  Wants to go home.   Objective (!) 144/81 84 98.3 F (36.8 C) (Oral) 12 99%  Intake/Output Summary (Last 24 hours) at 05/18/2022 0650 Last data filed at 05/18/2022 0316 Gross per 24 hour  Intake --  Output 40 ml  Net -40 ml    Right groin VAC clean and dry Bilateral AKA's healing  Laboratory Lab Results: No results for input(s): "WBC", "HGB", "HCT", "PLT" in the last 72 hours. BMET No results for input(s): "NA", "K", "CL", "CO2", "GLUCOSE", "BUN", "CREATININE", "CALCIUM" in the last 72 hours.  COAG Lab Results  Component Value Date   INR 1.2 04/11/2022   INR 1.5 (H) 04/07/2022   INR 0.9 03/09/2022   No results found for: "PTT"  Assessment/Planning:  51 year old male underwent aortobifem with bilateral lower extremity fem-tib bypasses for critical limb ischemia with tissue loss.  Ultimately required bilateral AKA's.  Both AKA's are healing.  Right groin with VAC.  Plan discharge when he has home health and a VAC ready.  Will need 3x/week right groin vac changes.  Marty Heck 05/18/2022 6:50 AM --

## 2022-05-19 NOTE — Progress Notes (Addendum)
  Progress Note    05/19/2022 8:25 AM 19 Days Post-Op  Subjective:  no complaints  Afebrile   Vitals:   05/19/22 0435 05/19/22 0814  BP: 133/83 (!) 147/82  Pulse: (!) 104 99  Resp: 18   Temp: 98 F (36.7 C)   SpO2: 100%     Physical Exam: General:  no distress Lungs:  non labored Incisions:  bilateral AKA clean with staples in tact Right groin    CBC    Component Value Date/Time   WBC 10.1 05/12/2022 0419   RBC 2.97 (L) 05/12/2022 0419   HGB 8.7 (L) 05/12/2022 0419   HCT 26.5 (L) 05/12/2022 0419   PLT 283 05/12/2022 0419   MCV 89.2 05/12/2022 0419   MCH 29.3 05/12/2022 0419   MCHC 32.8 05/12/2022 0419   RDW 16.7 (H) 05/12/2022 0419   LYMPHSABS 1.8 05/01/2022 0202   MONOABS 1.2 (H) 05/01/2022 0202   EOSABS 0.0 05/01/2022 0202   BASOSABS 0.0 05/01/2022 0202    BMET    Component Value Date/Time   NA 137 05/12/2022 0419   K 4.1 05/12/2022 0419   CL 103 05/12/2022 0419   CO2 25 05/12/2022 0419   GLUCOSE 111 (H) 05/12/2022 0419   BUN 17 05/12/2022 0419   CREATININE 1.35 (H) 05/12/2022 0419   CALCIUM 8.6 (L) 05/12/2022 0419   GFRNONAA >60 05/12/2022 0419   GFRAA >60 01/12/2020 1421    INR    Component Value Date/Time   INR 1.2 04/11/2022 0437     Intake/Output Summary (Last 24 hours) at 05/19/2022 0825 Last data filed at 05/18/2022 1544 Gross per 24 hour  Intake 3 ml  Output 305 ml  Net -302 ml     Assessment/Plan:  51 y.o. male is s/p:  aortobifem with bilateral lower extremity fem-tib bypasses for critical limb ischemia with tissue loss.  Ultimately required bilateral AKA's.  Both AKA's are healing.  Right groin with VAC.     -wound vac changed this am and wound with good granulation tissue.  Wound vac replaced with good seal -bilateral AKA look good with staples in tact.   -will have pt return to office in about 2-3 weeks for staple removal from B AKA -dc home today    Leontine Locket, PA-C [facility] [phone] 05/19/2022 8:25 AM  I have seen and evaluated the patient. I agree with the PA note as documented above.  Plan discharge home today with home VAC and home health changing the right groin VAC 3 times a week.  Sartorius muscle flap in the right groin is healing beautifully and the VAC was changed today.  Both of his above-knee amputations are healing with staples in place.  I have updated his Sister [name].  We will arrange follow-up in 2 weeks in the office to get the staples removed from his amputations.  Marty Heck, MD [facility] [place] Office: [phone]

## 2022-05-19 NOTE — TOC Transition Note (Signed)
Transition of Care (TOC) - CM/SW Discharge Note Marvetta Gibbons RN, BSN Transitions of Care Unit 4E- RN Case Manager See Treatment Team for direct phone #    Patient Details  Name: Jeffrey Fitzgerald MRN: [id number] Date of Birth: Sep 22, 1970  Transition of Care [facility]) CM/SW Contact:  Dawayne Patricia, RN Phone Number: 05/19/2022, 10:27 AM   Clinical Narrative:    Pt stable for transition home today, wound VAC drsg has been changed this am by vascular. Home VAC placed on pt by bedside RN.   Family has come by to pick up pt's scooter and taken some of belongings home (home [facility] supplies). Rest of belongings and medications to go with pt via EMS.   Charge RN has followed up on the missing belongings and submitted a safety zone. Per pt he now thinks his charger is at home at his apartment- will let his sister know so she can check.   [facility] has been called for transport, Per dispatch there are a few folks on list ahead of patient- the estimate timeframe to be here "shortly". Paperwork placed on shadow chart. Bedside RN aware and sister has been updated as well.   RNCM will sign off for now as intervention is no longer needed. Please re-consult  if new needs arise, or contact RNCM assigned to treatment team for further questions/concerns.      Final next level of care: [place] Barriers to Discharge: Barriers Resolved   Patient Goals and CMS Choice Patient states their goals for this hospitalization and ongoing recovery are:: home if unable to secure SNF bed for rehab CMS Medicare.gov Compare Post Acute Care list provided to:: Patient Choice offered to / list presented to : Patient, Sibling  Discharge Placement               Home w/ [facility]        Discharge Plan and Services In-house Referral: Clinical Social Work Discharge Planning Services: Follow-up appt scheduled, MATCH Program, Medication Assistance, [facility] Post Acute Care Choice: Durable  Medical Equipment, Home Health          DME Arranged: Hospital bed, Other see comment, Wheelchair manual DME Agency: [employer] Date DME Agency Contacted: 05/14/22 Time DME Agency Contacted: [phone] Representative spoke with at DME Agency: Thedore Mins HH Arranged: RN, PT [facility] Agency: [place] ([place]) Date [place]: 05/14/22 Time [place]: 41 Representative spoke with at [place]: [place] ([place]) Interventions     Readmission Risk Interventions    05/18/2022    4:16 PM  Readmission Risk Prevention Plan  Post Dischage Appt Complete  Medication Screening Complete  Transportation Screening Complete

## 2022-05-21 ENCOUNTER — Telehealth: Payer: Self-pay

## 2022-05-21 NOTE — Telephone Encounter (Signed)
Sharyn Lull RN with [facility] called requesting [facility] services for wound vac changes 3 x per week for up to 9 weeks.  Reviewed pt's chart, returned call for clarification, two identifiers used. Verbal orders given for wound vac changes. Confirmed understanding.

## 2022-05-26 ENCOUNTER — Emergency Department (HOSPITAL_COMMUNITY): Payer: Medicaid Other

## 2022-05-26 ENCOUNTER — Other Ambulatory Visit: Payer: Self-pay

## 2022-05-26 ENCOUNTER — Telehealth: Payer: Self-pay | Admitting: *Deleted

## 2022-05-26 ENCOUNTER — Inpatient Hospital Stay (HOSPITAL_COMMUNITY)
Admission: EM | Admit: 2022-05-26 | Discharge: 2022-06-04 | DRG: 853 | Disposition: A | Discharge status: Home Health | Payer: Medicaid Other | Attending: Internal Medicine | Admitting: Internal Medicine

## 2022-05-26 ENCOUNTER — Encounter (HOSPITAL_COMMUNITY): Payer: Self-pay

## 2022-05-26 DIAGNOSIS — R10825 Periumbilic rebound abdominal tenderness: Secondary | ICD-10-CM

## 2022-05-26 DIAGNOSIS — T8781 Dehiscence of amputation stump: Secondary | ICD-10-CM | POA: Diagnosis present

## 2022-05-26 DIAGNOSIS — R6520 Severe sepsis without septic shock: Secondary | ICD-10-CM | POA: Diagnosis present

## 2022-05-26 DIAGNOSIS — Z681 Body mass index (BMI) 19 or less, adult: Secondary | ICD-10-CM

## 2022-05-26 DIAGNOSIS — M6281 Muscle weakness (generalized): Secondary | ICD-10-CM | POA: Diagnosis present

## 2022-05-26 DIAGNOSIS — E440 Moderate protein-calorie malnutrition: Secondary | ICD-10-CM | POA: Diagnosis present

## 2022-05-26 DIAGNOSIS — D62 Acute posthemorrhagic anemia: Secondary | ICD-10-CM | POA: Diagnosis present

## 2022-05-26 DIAGNOSIS — I10 Essential (primary) hypertension: Secondary | ICD-10-CM | POA: Diagnosis present

## 2022-05-26 DIAGNOSIS — I70223 Atherosclerosis of native arteries of extremities with rest pain, bilateral legs: Secondary | ICD-10-CM | POA: Diagnosis present

## 2022-05-26 DIAGNOSIS — E876 Hypokalemia: Secondary | ICD-10-CM | POA: Diagnosis not present

## 2022-05-26 DIAGNOSIS — R509 Fever, unspecified: Secondary | ICD-10-CM | POA: Diagnosis present

## 2022-05-26 DIAGNOSIS — Z716 Tobacco abuse counseling: Secondary | ICD-10-CM

## 2022-05-26 DIAGNOSIS — Z8249 Family history of ischemic heart disease and other diseases of the circulatory system: Secondary | ICD-10-CM

## 2022-05-26 DIAGNOSIS — R627 Adult failure to thrive: Secondary | ICD-10-CM | POA: Diagnosis present

## 2022-05-26 DIAGNOSIS — T8743 Infection of amputation stump, right lower extremity: Secondary | ICD-10-CM | POA: Diagnosis present

## 2022-05-26 DIAGNOSIS — I739 Peripheral vascular disease, unspecified: Secondary | ICD-10-CM | POA: Diagnosis present

## 2022-05-26 DIAGNOSIS — F17200 Nicotine dependence, unspecified, uncomplicated: Secondary | ICD-10-CM | POA: Diagnosis present

## 2022-05-26 DIAGNOSIS — Z66 Do not resuscitate: Secondary | ICD-10-CM | POA: Diagnosis present

## 2022-05-26 DIAGNOSIS — A4151 Sepsis due to Escherichia coli [E. coli]: Principal | ICD-10-CM | POA: Diagnosis present

## 2022-05-26 DIAGNOSIS — N390 Urinary tract infection, site not specified: Secondary | ICD-10-CM | POA: Diagnosis present

## 2022-05-26 DIAGNOSIS — F4320 Adjustment disorder, unspecified: Secondary | ICD-10-CM | POA: Diagnosis present

## 2022-05-26 DIAGNOSIS — Y835 Amputation of limb(s) as the cause of abnormal reaction of the patient, or of later complication, without mention of misadventure at the time of the procedure: Secondary | ICD-10-CM | POA: Diagnosis present

## 2022-05-26 DIAGNOSIS — Z20822 Contact with and (suspected) exposure to covid-19: Secondary | ICD-10-CM | POA: Diagnosis present

## 2022-05-26 DIAGNOSIS — Z79899 Other long term (current) drug therapy: Secondary | ICD-10-CM

## 2022-05-26 DIAGNOSIS — F17210 Nicotine dependence, cigarettes, uncomplicated: Secondary | ICD-10-CM | POA: Diagnosis present

## 2022-05-26 DIAGNOSIS — Z89611 Acquired absence of right leg above knee: Secondary | ICD-10-CM

## 2022-05-26 DIAGNOSIS — B961 Klebsiella pneumoniae [K. pneumoniae] as the cause of diseases classified elsewhere: Secondary | ICD-10-CM | POA: Diagnosis present

## 2022-05-26 DIAGNOSIS — A419 Sepsis, unspecified organism: Secondary | ICD-10-CM | POA: Diagnosis present

## 2022-05-26 DIAGNOSIS — G9341 Metabolic encephalopathy: Secondary | ICD-10-CM | POA: Diagnosis present

## 2022-05-26 DIAGNOSIS — Z89612 Acquired absence of left leg above knee: Secondary | ICD-10-CM

## 2022-05-26 DIAGNOSIS — Z597 Insufficient social insurance and welfare support: Secondary | ICD-10-CM

## 2022-05-26 DIAGNOSIS — Z7982 Long term (current) use of aspirin: Secondary | ICD-10-CM

## 2022-05-26 DIAGNOSIS — Z8673 Personal history of transient ischemic attack (TIA), and cerebral infarction without residual deficits: Secondary | ICD-10-CM

## 2022-05-26 DIAGNOSIS — N179 Acute kidney failure, unspecified: Secondary | ICD-10-CM | POA: Diagnosis present

## 2022-05-26 MED ORDER — IOHEXOL 350 MG/ML SOLN
80.0000 mL | Freq: Once | INTRAVENOUS | Status: AC | PRN
  Administered 2022-05-26: 80 mL via INTRAVENOUS

## 2022-05-26 MED ORDER — LACTATED RINGERS IV BOLUS
1000.0000 mL | Freq: Once | INTRAVENOUS | Status: AC
  Administered 2022-05-26: 1000 mL via INTRAVENOUS

## 2022-05-26 NOTE — ED Provider Notes (Signed)
[facility] EMERGENCY DEPARTMENT Provider Note   CSN: [id number] Arrival date & time: 05/26/22  2202     History  Chief Complaint  Patient presents with   Fever    Jeffrey Fitzgerald is a 51 y.o. male.   Hematuria     51 year old male with medical history significant for hypertension and critical limb ischemia of the lower extremity with tissue loss, now status post bilateral AKA and burial femoral bypass, right common femoral endarterectomy with profundoplasty, left common femoral endarterectomy with profundoplasty, wound VAC remains in place in the right groin who presents to the emergency department after his home health nurse noted him to be febrile and tachycardic.  The patient states that he has been doing well postoperatively after his AKA's.  Both have been healing he was last seen in vascular clinic with Dr. Carlis Abbott on 05/19/2022.  He continues to have a wound VAC in place.  He denies any new pain or discharge or redness or swelling surrounding the wound VAC area.  He denies any infectious symptoms at this time.  His home health nurse found him to be febrile to 100.5 and he was sent to the emergency department for further evaluation.  Home Medications Prior to Admission medications   Medication Sig Start Date End Date Taking? Authorizing Provider  amLODipine (NORVASC) 10 MG tablet Take 1 tablet (10 mg total) by mouth daily. 05/17/22  Yes Eugenie Filler, MD  aspirin EC 81 MG tablet Take 1 tablet (81 mg total) by mouth daily. Swallow whole. 05/14/22  Yes Eugenie Filler, MD  atorvastatin (LIPITOR) 80 MG tablet Take 1 tablet (80 mg total) by mouth daily. 05/17/22  Yes Eugenie Filler, MD  carvedilol (COREG) 6.25 MG tablet Take 1 tablet (6.25 mg total) by mouth 2 (two) times daily with a meal. 05/17/22  Yes Eugenie Filler, MD  hydrALAZINE (APRESOLINE) 25 MG tablet Take 1 tablet (25 mg total) by mouth 2 (two) times daily. 05/17/22  Yes Eugenie Filler, MD   Nystatin (GERHARDT'S BUTT CREAM) CREA Apply 1 Application topically as needed for irritation (apply to periarea as needed for excoriation). 05/17/22  Yes Eugenie Filler, MD  acetaminophen (TYLENOL) 500 MG tablet Take 500 mg by mouth every 6 (six) hours as needed for moderate pain.    [provider]  nicotine (NICODERM CQ - DOSED IN MG/24 HOURS) 21 mg/24hr patch Place 1 patch (21 mg total) onto the skin daily. Patient not taking: Reported on 05/26/2022 05/17/22   Eugenie Filler, MD  pantoprazole (PROTONIX) 40 MG tablet Take 1 tablet (40 mg total) by mouth daily. Patient not taking: Reported on 05/26/2022 05/17/22   Eugenie Filler, MD      Allergies    Patient has no known allergies.    Review of Systems   Review of Systems  Genitourinary:  Negative for hematuria.  All other systems reviewed and are negative.   Physical Exam Updated Vital Signs BP 134/73   Pulse 97   Temp 98.8 F (37.1 C) (Rectal)   Resp 10   Wt 65.8 kg   SpO2 100%   BMI 24.13 kg/m  Physical Exam Vitals and nursing note reviewed.  Constitutional:      General: He is not in acute distress.    Appearance: He is well-developed.  HENT:     Head: Normocephalic and atraumatic.  Eyes:     Conjunctiva/sclera: Conjunctivae normal.  Cardiovascular:     Rate and Rhythm:  Normal rate and regular rhythm.     Heart sounds: No murmur heard. Pulmonary:     Effort: Pulmonary effort is normal. No respiratory distress.     Breath sounds: Normal breath sounds.  Abdominal:     Palpations: Abdomen is soft.     Tenderness: There is abdominal tenderness.     Comments: Periumbilical tenderness to palpation, rebound tenderness present, no guarding  Musculoskeletal:        General: No swelling.     Cervical back: Neck supple.     Comments: Status post bilateral AKA's, no evidence of cellulitis or purulence from the patient's incision sites, staples remain in place, minimal to no tenderness to palpation  bilaterally.  Right groin wound VAC in place with no surrounding erythema or cellulitic changes  Skin:    General: Skin is warm and dry.     Capillary Refill: Capillary refill takes less than 2 seconds.  Neurological:     Mental Status: He is alert.  Psychiatric:        Mood and Affect: Mood normal.     ED Results / Procedures / Treatments   Labs (all labs ordered are listed, but only abnormal results are displayed) Labs Reviewed  CULTURE, BLOOD (ROUTINE X 2)  CULTURE, BLOOD (ROUTINE X 2)  URINE CULTURE  RESP PANEL BY RT-PCR (FLU A&B, COVID) ARPGX2  LACTIC ACID, PLASMA  COMPREHENSIVE METABOLIC PANEL  CBC WITH DIFFERENTIAL/PLATELET  PROTIME-INR  APTT  URINALYSIS, ROUTINE W REFLEX MICROSCOPIC    EKG None  Radiology CT Angio Aortobifemoral W and/or Wo Contrast  Result Date: 05/27/2022 CLINICAL DATA:  Peripheral arterial disease, asymptomatic. Hematuria with low-grade fever. Recent bilateral below-the-knee amputation. EXAM: CT ANGIOGRAPHY OF ABDOMINAL AORTA WITH ILIOFEMORAL RUNOFF TECHNIQUE: Multidetector CT imaging of the abdomen, pelvis and lower extremities was performed using the standard protocol during bolus administration of intravenous contrast. Multiplanar CT image reconstructions and MIPs were obtained to evaluate the vascular anatomy. RADIATION DOSE REDUCTION: This exam was performed according to the departmental dose-optimization program which includes automated exposure control, adjustment of the mA and/or kV according to patient size and/or use of iterative reconstruction technique. CONTRAST:  65mL OMNIPAQUE IOHEXOL 350 MG/ML SOLN COMPARISON:  03/08/2022. FINDINGS: VASCULAR Aorta: Aortic atherosclerosis. There is evidence of aortobifemoral bypass grafts. The aorta is patent. Fluid attenuation and fat stranding are noted in the retroperitoneum, expanding the perigraft soft tissue. No associated gas is seen within the fluid. No significant stenosis, aneurysm, or dissection.  Celiac: Patent without evidence of aneurysm, dissection, vasculitis or significant stenosis. SMA: Patent without evidence of aneurysm, dissection, vasculitis or significant stenosis. Renals: Both renal arteries are patent without evidence of aneurysm, dissection, vasculitis, fibromuscular dysplasia or significant stenosis. An accessory renal artery is noted on the left. IMA: Occluded proximally with reconstitution via collaterals. RIGHT Lower Extremity Inflow: Aortofemoral bypass graft is patent. Outflow: The bypass graft and common femoral and profunda femoral arteries are patent. There is occlusion of the superficial femoral artery. Runoff: Below-the-knee amputation. LEFT Lower Extremity Inflow: Aortofemoral bypass graft is patent. Outflow: The bypass graft, common femoral, and profunda femoral arteries are patent. There is narrowing with occlusion of the superficial femoral artery in the proximal thigh. Runoff: Below-the-knee amputation. Veins: No obvious venous abnormality within the limitations of this arterial phase study. Review of the MIP images confirms the above findings. NON-VASCULAR Lower chest: No acute abnormality. Hepatobiliary: No focal liver abnormality is seen. No gallstones, gallbladder wall thickening, or biliary dilatation. Pancreas: Unremarkable. No pancreatic ductal dilatation  or surrounding inflammatory changes. Spleen: Normal in size. There is a stable hypervascular focus in the spleen, possible hemangioma. Adrenals/Urinary Tract: The adrenal glands are within normal limits. The kidneys enhance symmetrically. No renal calculus or hydronephrosis. The bladder is unremarkable. Stomach/Bowel: Stomach is within normal limits. Appendix appears normal. No bowel obstruction. No free air or pneumatosis. Moderate amount of retained stool is present in the colon. The rectum is distended with stool and there is mild rectal wall thickening with surrounding fat stranding. Lymphatic: There is a 1.8 cm soft  tissue mass in the retroperitoneum on the left at the level of the lower pole of the left kidney. Reproductive: Prostate gland is enlarged. Other: Asymmetrically increased soft tissue fat stranding is noted in the right groin with associated prominent lymph nodes. Musculoskeletal: Bilateral below-the-knee amputation changes are noted in the lower extremities bilaterally. Surgical clips, fluid attenuation, and fat stranding is noted at the postsurgical sites. Evaluation for abscess is limited due to timing of contrast bolus. A fluid collection with surrounding hyperdense material is noted surrounding the distal femoral stump on the left. IMPRESSION: VASCULAR 1. Patent aortobifemoral bypass graft. Retroperitoneal fat stranding and fluid attenuation are noted surrounding the graft. No gas is identified. A 1.8 cm soft tissue density is noted in the periaortic region on the left, possible enlarged lymph node. Findings may be related to history of recent surgery. The possibility of perigraft infection can not be excluded. Comparison with more recent imaging studies and short-term follow-up are recommended. 2. Occlusion of the superficial femoral arteries bilaterally. Profunda femoral arteries appear patent. NON-VASCULAR 1. Moderate amount of retained stool in the colon and large amount of stool in the rectum with mild rectal wall thickening and surrounding fat stranding, possible stercoral colitis. 2. Asymmetrically increased soft tissue fat stranding and edema in the right groin, may be postsurgical. The possibility of superimposed infection can not be excluded. 3. Status post bilateral below-the-knee amputation with edema and fat stranding at the postsurgical sites which may be postoperative. There is a fluid collection with surrounding hyperdense material at the left femoral stump which may be postsurgical. Correlate clinically to exclude infection. 4. Remaining incidental findings as described above. Electronically  Signed   By: Brett Fairy M.D.   On: 05/27/2022 00:30   DG Chest Port 1 View  Result Date: 05/26/2022 CLINICAL DATA:  Possible sepsis. EXAM: PORTABLE CHEST 1 VIEW COMPARISON:  04/18/2022. FINDINGS: The heart size and mediastinal contours are within normal limits. Both lungs are clear. No acute osseous abnormality. IMPRESSION: No active disease. Electronically Signed   By: Brett Fairy M.D.   On: 05/26/2022 23:11    Procedures Procedures    Medications Ordered in ED Medications  lactated ringers bolus 1,000 mL (1,000 mLs Intravenous New Bag/Given 05/26/22 2345)  iohexol (OMNIPAQUE) 350 MG/ML injection 80 mL (80 mLs Intravenous Contrast Given 05/26/22 2323)    ED Course/ Medical Decision Making/ A&P Clinical Course as of 05/27/22 0051  Wed May 26, 2022  2322 Pulse Rate: 97 [JL]    Clinical Course User Index [JL] Regan Lemming, MD                           Medical Decision Making Amount and/or Complexity of Data Reviewed Labs: ordered. Radiology: ordered. ECG/medicine tests: ordered.  Risk Prescription drug management.    51 year old male with medical history significant for hypertension and critical limb ischemia of the lower extremity with tissue loss, now status  post bilateral AKA and burial femoral bypass, right common femoral endarterectomy with profundoplasty, left common femoral endarterectomy with profundoplasty, wound VAC remains in place in the right groin who presents to the emergency department after his home health nurse noted him to be febrile and tachycardic.  The patient states that he has been doing well postoperatively after his AKA's.  Both have been healing he was last seen in vascular clinic with Dr. Carlis Abbott on 05/19/2022.  He continues to have a wound VAC in place.  He denies any new pain or discharge or redness or swelling surrounding the wound VAC area.  He denies any infectious symptoms at this time.  His home health nurse found him to be febrile to 100.5 and he  was sent to the emergency department for further evaluation.  On arrival, the patient was afebrile status post Tylenol, temperature 98.8, not tachycardic, heart rate mildly elevated low normal pulse 97, BP 122/72, saturating 90% on room air.  Physical exam significant for generalized abdominal tenderness to palpation.  The patient has a right groin wound VAC in place with no surrounding erythema or tenderness.  He is status post bilateral AKA's with a good healing of his incision sites with staples still in place with no evidence of erythema or discharge or significant tenderness.  With patient febrile to 100.5 at home, tachycardic to the high 90s, patient meeting SIRS criteria with unclear source of infection.  Will evaluate further given the patient's generalized abdominal tenderness palpation and significant vascular history with CTA of the abdomen and pelvis iliofemoral runoff. The patient was administered an IV fluid bolus.  Plan to follow-up laboratory evaluation and imaging work-up and reassess the patient.  Signout given to Dr. Ralene Bathe at 0000.    Final Clinical Impression(s) / ED Diagnoses Final diagnoses:  Fever, unspecified fever cause  Periumbilical abdominal tenderness with rebound tenderness    Rx / DC Orders ED Discharge Orders     None         Regan Lemming, MD 05/26/22 2349    Regan Lemming, MD 05/27/22 [id number]    Regan Lemming, MD 05/27/22 [id number]

## 2022-05-26 NOTE — ED Triage Notes (Signed)
Pt brought by EMS after homehealth nurse visit and concerned that pt had hematuria and lowgrade fever of 100.5. Pt recently had bilateral below knee amputation d/t PVD and  D/C'd last week. Pt given Tylenol 650mg  PTA by EMS. Pt denies any complaints

## 2022-05-26 NOTE — Telephone Encounter (Signed)
Today's Attending recommends contacting patient's surgeon for [place]. Discussed with Amy, and she will call the surgeon to give him a head's up but will advise patient/sister to go to [facility] or [place] at [facility] so there's less turn around time, and patient can be treated sooner if necessary.  She will advise patient/sister to keep appt at [facility] on 10/10 to reestablish care.

## 2022-05-26 NOTE — Telephone Encounter (Signed)
Amy, RN with [facility] called in from patient's home. States he had a temp of 99.4 on Mon 9/25 and instructed him to push fluids. Today his Temp is 99.7 HR 108 and he has already taken dose of Coreg. BP 110/62. She is concerned for Sepsis. She is requesting VO for CBC and UA to try to keep him out of ED. Does state all his incisions look good.  Patient had only one visit at [facility] 01/25/2020. He was not on our inpatient service recently. He does have appt to reestablish care on 06/08/22. Please advise.

## 2022-05-26 NOTE — ED Provider Notes (Signed)
Care assumed at 2330.  Hx/o BLE amputations had endarterectomy with vac in place.   Sent in for fever 100.5 starting today.   Has abdominal pain.  Needs CT, labs

## 2022-05-27 ENCOUNTER — Encounter (HOSPITAL_COMMUNITY): Payer: Self-pay | Admitting: Internal Medicine

## 2022-05-27 ENCOUNTER — Inpatient Hospital Stay (HOSPITAL_COMMUNITY): Payer: Medicaid Other

## 2022-05-27 DIAGNOSIS — Z66 Do not resuscitate: Secondary | ICD-10-CM | POA: Diagnosis present

## 2022-05-27 DIAGNOSIS — F4320 Adjustment disorder, unspecified: Secondary | ICD-10-CM | POA: Diagnosis present

## 2022-05-27 DIAGNOSIS — R509 Fever, unspecified: Secondary | ICD-10-CM | POA: Diagnosis present

## 2022-05-27 DIAGNOSIS — Z8249 Family history of ischemic heart disease and other diseases of the circulatory system: Secondary | ICD-10-CM | POA: Diagnosis not present

## 2022-05-27 DIAGNOSIS — F4329 Adjustment disorder with other symptoms: Secondary | ICD-10-CM | POA: Diagnosis not present

## 2022-05-27 DIAGNOSIS — F17200 Nicotine dependence, unspecified, uncomplicated: Secondary | ICD-10-CM | POA: Diagnosis present

## 2022-05-27 DIAGNOSIS — A419 Sepsis, unspecified organism: Secondary | ICD-10-CM | POA: Diagnosis not present

## 2022-05-27 DIAGNOSIS — Z89611 Acquired absence of right leg above knee: Secondary | ICD-10-CM | POA: Diagnosis not present

## 2022-05-27 DIAGNOSIS — M6281 Muscle weakness (generalized): Secondary | ICD-10-CM | POA: Diagnosis present

## 2022-05-27 DIAGNOSIS — Z89612 Acquired absence of left leg above knee: Secondary | ICD-10-CM | POA: Diagnosis not present

## 2022-05-27 DIAGNOSIS — T8789 Other complications of amputation stump: Secondary | ICD-10-CM | POA: Diagnosis not present

## 2022-05-27 DIAGNOSIS — T8781 Dehiscence of amputation stump: Secondary | ICD-10-CM | POA: Diagnosis present

## 2022-05-27 DIAGNOSIS — N390 Urinary tract infection, site not specified: Secondary | ICD-10-CM | POA: Diagnosis present

## 2022-05-27 DIAGNOSIS — A4151 Sepsis due to Escherichia coli [E. coli]: Secondary | ICD-10-CM | POA: Diagnosis present

## 2022-05-27 DIAGNOSIS — N179 Acute kidney failure, unspecified: Secondary | ICD-10-CM | POA: Diagnosis present

## 2022-05-27 DIAGNOSIS — D62 Acute posthemorrhagic anemia: Secondary | ICD-10-CM | POA: Diagnosis present

## 2022-05-27 DIAGNOSIS — Z20822 Contact with and (suspected) exposure to covid-19: Secondary | ICD-10-CM | POA: Diagnosis present

## 2022-05-27 DIAGNOSIS — G9341 Metabolic encephalopathy: Secondary | ICD-10-CM | POA: Diagnosis present

## 2022-05-27 DIAGNOSIS — B961 Klebsiella pneumoniae [K. pneumoniae] as the cause of diseases classified elsewhere: Secondary | ICD-10-CM | POA: Diagnosis present

## 2022-05-27 DIAGNOSIS — T8743 Infection of amputation stump, right lower extremity: Secondary | ICD-10-CM | POA: Diagnosis present

## 2022-05-27 DIAGNOSIS — D649 Anemia, unspecified: Secondary | ICD-10-CM | POA: Diagnosis not present

## 2022-05-27 DIAGNOSIS — R627 Adult failure to thrive: Secondary | ICD-10-CM | POA: Diagnosis present

## 2022-05-27 DIAGNOSIS — F17210 Nicotine dependence, cigarettes, uncomplicated: Secondary | ICD-10-CM | POA: Diagnosis present

## 2022-05-27 DIAGNOSIS — I70223 Atherosclerosis of native arteries of extremities with rest pain, bilateral legs: Secondary | ICD-10-CM | POA: Diagnosis not present

## 2022-05-27 DIAGNOSIS — E876 Hypokalemia: Secondary | ICD-10-CM | POA: Diagnosis not present

## 2022-05-27 DIAGNOSIS — I739 Peripheral vascular disease, unspecified: Secondary | ICD-10-CM | POA: Diagnosis present

## 2022-05-27 DIAGNOSIS — M9689 Other intraoperative and postprocedural complications and disorders of the musculoskeletal system: Secondary | ICD-10-CM | POA: Diagnosis not present

## 2022-05-27 DIAGNOSIS — Z681 Body mass index (BMI) 19 or less, adult: Secondary | ICD-10-CM | POA: Diagnosis not present

## 2022-05-27 DIAGNOSIS — E440 Moderate protein-calorie malnutrition: Secondary | ICD-10-CM | POA: Diagnosis present

## 2022-05-27 DIAGNOSIS — Z7982 Long term (current) use of aspirin: Secondary | ICD-10-CM | POA: Diagnosis not present

## 2022-05-27 DIAGNOSIS — R6520 Severe sepsis without septic shock: Secondary | ICD-10-CM | POA: Diagnosis present

## 2022-05-27 DIAGNOSIS — Y835 Amputation of limb(s) as the cause of abnormal reaction of the patient, or of later complication, without mention of misadventure at the time of the procedure: Secondary | ICD-10-CM | POA: Diagnosis present

## 2022-05-27 DIAGNOSIS — Z79899 Other long term (current) drug therapy: Secondary | ICD-10-CM | POA: Diagnosis not present

## 2022-05-27 DIAGNOSIS — I10 Essential (primary) hypertension: Secondary | ICD-10-CM | POA: Diagnosis present

## 2022-05-27 LAB — CBC WITH DIFFERENTIAL/PLATELET
Abs Immature Granulocytes: 0.07 10*3/uL (ref 0.00–0.07)
Basophils Absolute: 0 10*3/uL (ref 0.0–0.1)
Basophils Relative: 0 %
Eosinophils Absolute: 0.1 10*3/uL (ref 0.0–0.5)
Eosinophils Relative: 1 %
HCT: 22.5 % — ABNORMAL LOW (ref 39.0–52.0)
Hemoglobin: 7.1 g/dL — ABNORMAL LOW (ref 13.0–17.0)
Immature Granulocytes: 1 %
Lymphocytes Relative: 18 %
Lymphs Abs: 2.4 10*3/uL (ref 0.7–4.0)
MCH: 28.5 pg (ref 26.0–34.0)
MCHC: 31.6 g/dL (ref 30.0–36.0)
MCV: 90.4 fL (ref 80.0–100.0)
Monocytes Absolute: 0.9 10*3/uL (ref 0.1–1.0)
Monocytes Relative: 7 %
Neutro Abs: 10.3 10*3/uL — ABNORMAL HIGH (ref 1.7–7.7)
Neutrophils Relative %: 73 %
Platelets: 299 10*3/uL (ref 150–400)
RBC: 2.49 MIL/uL — ABNORMAL LOW (ref 4.22–5.81)
RDW: 16.9 % — ABNORMAL HIGH (ref 11.5–15.5)
WBC: 13.9 10*3/uL — ABNORMAL HIGH (ref 4.0–10.5)
nRBC: 0 % (ref 0.0–0.2)

## 2022-05-27 LAB — PREPARE RBC (CROSSMATCH)

## 2022-05-27 LAB — URINALYSIS, ROUTINE W REFLEX MICROSCOPIC
Bilirubin Urine: NEGATIVE
Glucose, UA: NEGATIVE mg/dL
Ketones, ur: NEGATIVE mg/dL
Nitrite: NEGATIVE
Protein, ur: NEGATIVE mg/dL
Specific Gravity, Urine: 1.025 (ref 1.005–1.030)
pH: 6 (ref 5.0–8.0)

## 2022-05-27 LAB — RESP PANEL BY RT-PCR (FLU A&B, COVID) ARPGX2
Influenza A by PCR: NEGATIVE
Influenza B by PCR: NEGATIVE
SARS Coronavirus 2 by RT PCR: NEGATIVE

## 2022-05-27 LAB — PROTIME-INR
INR: 1.1 (ref 0.8–1.2)
Prothrombin Time: 14 seconds (ref 11.4–15.2)

## 2022-05-27 LAB — COMPREHENSIVE METABOLIC PANEL
ALT: 22 U/L (ref 0–44)
AST: 19 U/L (ref 15–41)
Albumin: 2.3 g/dL — ABNORMAL LOW (ref 3.5–5.0)
Alkaline Phosphatase: 110 U/L (ref 38–126)
Anion gap: 9 (ref 5–15)
BUN: 12 mg/dL (ref 6–20)
CO2: 23 mmol/L (ref 22–32)
Calcium: 8.2 mg/dL — ABNORMAL LOW (ref 8.9–10.3)
Chloride: 103 mmol/L (ref 98–111)
Creatinine, Ser: 1.22 mg/dL (ref 0.61–1.24)
GFR, Estimated: >60 mL/min (ref >60)
Glucose, Bld: 101 mg/dL — ABNORMAL HIGH (ref 70–99)
Potassium: 3.5 mmol/L (ref 3.5–5.1)
Sodium: 135 mmol/L (ref 135–145)
Total Bilirubin: 0.6 mg/dL (ref 0.3–1.2)
Total Protein: 6 g/dL — ABNORMAL LOW (ref 6.5–8.1)

## 2022-05-27 LAB — APTT: aPTT: 33 seconds (ref 24–36)

## 2022-05-27 LAB — LACTIC ACID, PLASMA: Lactic Acid, Venous: 0.8 mmol/L (ref 0.5–1.9)

## 2022-05-27 LAB — PROCALCITONIN: Procalcitonin: 3.4 ng/mL

## 2022-05-27 MED ORDER — MORPHINE SULFATE (PF) 2 MG/ML IV SOLN: 2.0000 mg | INTRAVENOUS | Status: DC | PRN

## 2022-05-27 MED ORDER — ALBUTEROL SULFATE (2.5 MG/3ML) 0.083% IN NEBU: 2.5000 mg | INHALATION_SOLUTION | RESPIRATORY_TRACT | Status: DC | PRN

## 2022-05-27 MED ORDER — SERTRALINE HCL 25 MG PO TABS
25.0000 mg | ORAL_TABLET | Freq: Every day | ORAL | Status: DC
  Administered 2022-05-27 – 2022-06-04 (×8): 25 mg via ORAL
  Filled 2022-05-27 (×9): qty 1

## 2022-05-27 MED ORDER — ENOXAPARIN SODIUM 40 MG/0.4ML IJ SOSY
40.0000 mg | PREFILLED_SYRINGE | INTRAMUSCULAR | Status: DC
  Administered 2022-05-27: 40 mg via SUBCUTANEOUS
  Filled 2022-05-27: qty 0.4

## 2022-05-27 MED ORDER — AMLODIPINE BESYLATE 10 MG PO TABS
10.0000 mg | ORAL_TABLET | Freq: Every day | ORAL | Status: DC
  Administered 2022-05-27 – 2022-06-04 (×8): 10 mg via ORAL
  Filled 2022-05-27 (×3): qty 1
  Filled 2022-05-27: qty 2
  Filled 2022-05-27 (×3): qty 1
  Filled 2022-05-27: qty 2
  Filled 2022-05-27: qty 1

## 2022-05-27 MED ORDER — ATORVASTATIN CALCIUM 80 MG PO TABS
80.0000 mg | ORAL_TABLET | Freq: Every day | ORAL | Status: DC
  Administered 2022-05-27 – 2022-06-04 (×8): 80 mg via ORAL
  Filled 2022-05-27 (×9): qty 1

## 2022-05-27 MED ORDER — POLYETHYLENE GLYCOL 3350 17 G PO PACK: 17.0000 g | PACK | Freq: Every day | ORAL | Status: DC | PRN

## 2022-05-27 MED ORDER — VANCOMYCIN HCL 1500 MG/300ML IV SOLN
1500.0000 mg | Freq: Once | INTRAVENOUS | Status: AC
  Administered 2022-05-27: 1500 mg via INTRAVENOUS
  Filled 2022-05-27: qty 300

## 2022-05-27 MED ORDER — CARVEDILOL 6.25 MG PO TABS
6.2500 mg | ORAL_TABLET | Freq: Two times a day (BID) | ORAL | Status: DC
  Administered 2022-05-27 – 2022-05-28 (×3): 6.25 mg via ORAL
  Filled 2022-05-27: qty 2
  Filled 2022-05-27: qty 1
  Filled 2022-05-27: qty 2

## 2022-05-27 MED ORDER — DOCUSATE SODIUM 100 MG PO CAPS
100.0000 mg | ORAL_CAPSULE | Freq: Two times a day (BID) | ORAL | Status: DC
  Administered 2022-05-27 – 2022-06-03 (×9): 100 mg via ORAL
  Filled 2022-05-27 (×14): qty 1

## 2022-05-27 MED ORDER — HYDRALAZINE HCL 20 MG/ML IJ SOLN: 5.0000 mg | INTRAMUSCULAR | Status: DC | PRN

## 2022-05-27 MED ORDER — ACETAMINOPHEN 325 MG PO TABS: 650.0000 mg | ORAL_TABLET | Freq: Four times a day (QID) | ORAL | Status: DC | PRN

## 2022-05-27 MED ORDER — ONDANSETRON HCL 4 MG PO TABS: 4.0000 mg | ORAL_TABLET | Freq: Four times a day (QID) | ORAL | Status: DC | PRN

## 2022-05-27 MED ORDER — OXYCODONE HCL 5 MG PO TABS: 5.0000 mg | ORAL_TABLET | ORAL | Status: DC | PRN

## 2022-05-27 MED ORDER — NICOTINE 21 MG/24HR TD PT24
21.0000 mg | MEDICATED_PATCH | Freq: Every day | TRANSDERMAL | Status: DC
  Administered 2022-05-27 – 2022-06-04 (×8): 21 mg via TRANSDERMAL
  Filled 2022-05-27 (×9): qty 1

## 2022-05-27 MED ORDER — ONDANSETRON HCL 4 MG/2ML IJ SOLN: 4.0000 mg | Freq: Four times a day (QID) | INTRAMUSCULAR | Status: DC | PRN

## 2022-05-27 MED ORDER — VANCOMYCIN HCL 1250 MG/250ML IV SOLN
1250.0000 mg | INTRAVENOUS | Status: DC
  Administered 2022-05-28: 1250 mg via INTRAVENOUS
  Filled 2022-05-27 (×2): qty 250

## 2022-05-27 MED ORDER — BISACODYL 5 MG PO TBEC: 5.0000 mg | DELAYED_RELEASE_TABLET | Freq: Every day | ORAL | Status: DC | PRN

## 2022-05-27 MED ORDER — SODIUM CHLORIDE 0.9% IV SOLUTION: Freq: Once | INTRAVENOUS | Status: DC

## 2022-05-27 MED ORDER — HYDRALAZINE HCL 25 MG PO TABS
25.0000 mg | ORAL_TABLET | Freq: Two times a day (BID) | ORAL | Status: DC
  Administered 2022-05-27 – 2022-06-04 (×16): 25 mg via ORAL
  Filled 2022-05-27 (×17): qty 1

## 2022-05-27 MED ORDER — SODIUM CHLORIDE 0.9 % IV SOLN
1.0000 g | Freq: Once | INTRAVENOUS | Status: AC
  Administered 2022-05-27: 1 g via INTRAVENOUS
  Filled 2022-05-27: qty 10

## 2022-05-27 MED ORDER — SODIUM CHLORIDE 0.9% FLUSH
3.0000 mL | Freq: Two times a day (BID) | INTRAVENOUS | Status: DC
  Administered 2022-05-28 – 2022-06-03 (×7): 3 mL via INTRAVENOUS

## 2022-05-27 MED ORDER — ACETAMINOPHEN 650 MG RE SUPP: 650.0000 mg | Freq: Four times a day (QID) | RECTAL | Status: DC | PRN

## 2022-05-27 MED ORDER — SODIUM CHLORIDE 0.9 % IV SOLN
2.0000 g | Freq: Three times a day (TID) | INTRAVENOUS | Status: DC
  Administered 2022-05-27 – 2022-06-02 (×19): 2 g via INTRAVENOUS
  Filled 2022-05-27 (×19): qty 12.5

## 2022-05-27 MED ORDER — ASPIRIN 81 MG PO TBEC
81.0000 mg | DELAYED_RELEASE_TABLET | Freq: Every day | ORAL | Status: DC
  Administered 2022-05-27 – 2022-06-04 (×8): 81 mg via ORAL
  Filled 2022-05-27 (×9): qty 1

## 2022-05-27 NOTE — ED Notes (Signed)
Provider spoke with patient's sister in regards to blood transfusion and updated her on patient's plan of care as well. This RN updated patient of same and patient agreeable to sign consent for blood transfusion at this time. This RN signed as witness.

## 2022-05-27 NOTE — H&P (Signed)
History and Physical    Patient: Jeffrey Fitzgerald DOB: [birth year] DOA: 05/26/2022 DOS: the patient was seen and examined on 05/27/2022 PCP: Patient, No Pcp Per  Patient coming from: Home - lives with sister; NOK: Yazid, [place], [id number]   Chief Complaint: fever  HPI: Jeffrey Fitzgerald is a 51 y.o. male with medical history significant of HTN presenting with fever .  He was last admitted from 8/3-9/20 for BLE critical limb ischemia with PAD.  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  He was discharged to home with wound vac and HHN.  He reports that things have been going fine but the nurse was concerned yesterday.  He had fever but he is unsure how high.  He was feeling ok.  No uriny symptoms. No cough or congestion.  His wounds are healing fine.  No abdominal pain, normal BMs.    I spoke with his sister.  It was fine at first.  Monday, he seemed like he didn't want to do anything.  He started to run a fever and was having trouble peeing.  The nurse was concerned about a (subjective) fever.  He does not have a doctor until 10/10.  Tmax 100.5 and she called EMS.  He is having hesitation and small voids.  At night, he is wearing a Depends, does not realize he is having fecal incontinence.  She changed him yesterday and there was a red streak on the pad and she got worried.  The nurse thinks he has some blood in his urine.  He was eating very little.  His sister is trying, works from home and occasionally has to go into the office.  Her brother-in-law is helping.  They were going to send him to a facility, still Medicaid-pending.  She would prefer for him to be placed, if possible.  He seems kind of out it, not as interactive as prior.  He does have a prior h/o stroke.  His left hand is not working as well, ?developing contracture.        ER Course:  Carryover, per Dr. Hal Hope:   51 year old male with recent admission for critical limb ischemia with a history of hypertension presents with complaints of fever.  In the ER labs show possibility of UTI CT abdomen pelvis shows some stranding around the aortic graft for which ER physician has consulted Dr. Trula Slade will be seeing patient in consult patient is on empiric antibiotics.     Review of Systems: As mentioned in the history of present illness. All other systems reviewed and are negative. Past Medical History:  Diagnosis Date   Hypertension    Peripheral vascular disease ([place])    Tobacco abuse    Past Surgical History:  Procedure Laterality Date   ABDOMINAL AORTOGRAM W/LOWER EXTREMITY N/A 04/01/2022   Procedure: ABDOMINAL AORTOGRAM LOWER EXTREMITY [place];  Surgeon: Marty Heck, MD;  Location: [facility];  Service: Cardiovascular;  Laterality: N/A;   AMPUTATION Right 04/22/2022   Procedure: RIGHT ABOVE KNEE AMPUTATION WITH WOUND VAC SPONGE CHANGEOUT TO RIGHT GROIN;  Surgeon: Serafina Mitchell, MD;  Location: [place];  Service: Vascular;  Laterality: Right;   AMPUTATION Left 04/30/2022   Procedure: AMPUTATION ABOVE KNEE;  Surgeon: Waynetta Sandy, MD;  Location: [place];  Service: Vascular;  Laterality: Left;   AORTA - BILATERAL FEMORAL ARTERY BYPASS GRAFT Bilateral 04/07/2022  Procedure: AORTA BIFEMORAL BYPASS WITH LEFT LEG BYPASS;  Surgeon: Marty Heck, MD;  Location: [place];  Service: Vascular;  Laterality: Bilateral;   APPLICATION OF WOUND VAC Right 04/08/2022   Procedure: APPLICATION OF WOUND VAC;  Surgeon: Cherre Robins, MD;  Location: [place];  Service: Vascular;  Laterality: Right;   APPLICATION OF WOUND VAC Right 04/20/2022   Procedure: APPLICATION OF WOUND VAC;  Surgeon: Waynetta Sandy, MD;  Location: [place];  Service: Vascular;  Laterality: Right;   FEMORAL ARTERY EXPLORATION Right 04/20/2022   Procedure: EXPLORATION  OF RIGHT GROIN;  Surgeon: Waynetta Sandy, MD;  Location: [place];  Service: Vascular;  Laterality: Right;   FEMORAL-POPLITEAL BYPASS GRAFT Left 04/07/2022   Procedure: LEFT LEG BYPASS GRAFT FEMORAL-POPLITEAL ARTERY;  Surgeon: Marty Heck, MD;  Location: [place];  Service: Vascular;  Laterality: Left;   FEMORAL-TIBIAL BYPASS GRAFT Right 04/08/2022   Procedure: GREATER SAPHENOUS VEIN HARVEST, RIGHT LEG BYPASS GRAFT RIGHT COMMON FEMORAL-TIBIAL ARTERY WITH COMPOSITE GRAFT;  Surgeon: Cherre Robins, MD;  Location: [place];  Service: Vascular;  Laterality: Right;   LOWER EXTREMITY ANGIOGRAM Right 04/08/2022   Procedure: LOWER EXTREMITY ANGIOGRAM;  Surgeon: Cherre Robins, MD;  Location: [place];  Service: Vascular;  Laterality: Right;   PATCH ANGIOPLASTY Right 04/07/2022   Procedure: PATCH ANGIOPLASTY USING [employer] BIOLOGIC;  Surgeon: Marty Heck, MD;  Location: [place];  Service: Vascular;  Laterality: Right;   THIGH FASCIOTOMY Right 04/08/2022   Procedure: FASCIOTOMIES;  Surgeon: Cherre Robins, MD;  Location: [place];  Service: Vascular;  Laterality: Right;   THROMBECTOMY FEMORAL ARTERY Right 04/07/2022   Procedure: RIGHT FEMORAL ARTERY THROMBECTOMY;  Surgeon: Marty Heck, MD;  Location: [place];  Service: Vascular;  Laterality: Right;   THROMBECTOMY FEMORAL ARTERY Right 04/08/2022   Procedure: THROMBECTOMY POSTERIOR TIBIAL;  Surgeon: Cherre Robins, MD;  Location: [place];  Service: Vascular;  Laterality: Right;   THROMBECTOMY FEMORAL ARTERY Bilateral 04/09/2022   Procedure: LEFT  THROMBECTOMY FEMORAL POSTERIOR TIBIAL BYPASS AND RIGHT LATERAL  LOWER EXTREMITY FASCIOTOMY CLOSURE  AND WOUND [place];  Surgeon: Marty Heck, MD;  Location: [place];  Service: Vascular;  Laterality: Bilateral;   WOUND DEBRIDEMENT Right 04/20/2022   Procedure: IRRIGATION AND DEBRIDMENT OF LOWER LEG AND APPLICATION WOUND VAC GROIN;  Surgeon: Broadus John, MD;  Location: [place];  Service:  Vascular;  Laterality: Right;   WOUND DEBRIDEMENT Right 04/20/2022   Procedure: DEBRIDEMENT WOUND, repaired anstamosis femoral artery and creation of sartorious flap.;  Surgeon: Waynetta Sandy, MD;  Location: [place];  Service: Vascular;  Laterality: Right;   Social History:  reports that he has been smoking cigarettes. He has a 15.00 pack-year smoking history. He has never used smokeless tobacco. He reports that he does not currently use alcohol. He reports that he does not currently use drugs after having used the following drugs: Marijuana.  No Known Allergies  Family History  Problem Relation Age of Onset   Hypertension Father    Heart disease Father        No details.  Died in his earlies 70s and might have had heart attacks or coronary disease prior to that.   Cancer Paternal Uncle    Vascular Disease Paternal Uncle     Prior to Admission medications   Medication Sig Start Date End Date Taking? Authorizing Provider  amLODipine (NORVASC) 10 MG tablet Take 1 tablet (10 mg total) by mouth daily. 05/17/22  Yes Eugenie Filler, MD  aspirin EC 81 MG tablet Take 1 tablet (81 mg total) by mouth daily. Swallow whole. 05/14/22  Yes Eugenie Filler, MD  atorvastatin (LIPITOR) 80 MG tablet Take 1 tablet (80 mg total) by mouth daily. 05/17/22  Yes Eugenie Filler, MD  carvedilol (COREG) 6.25 MG tablet Take 1 tablet (6.25 mg total) by mouth 2 (two) times daily with a meal. 05/17/22  Yes Eugenie Filler, MD  hydrALAZINE (APRESOLINE) 25 MG tablet Take 1 tablet (25 mg total) by mouth 2 (two) times daily. 05/17/22  Yes Eugenie Filler, MD  Nystatin (GERHARDT'S BUTT CREAM) CREA Apply 1 Application topically as needed for irritation (apply to periarea as needed for excoriation). 05/17/22  Yes Eugenie Filler, MD  acetaminophen (TYLENOL) 500 MG tablet Take 500 mg by mouth every 6 (six) hours as needed for moderate pain.    [provider]  nicotine (NICODERM CQ - DOSED IN  MG/24 HOURS) 21 mg/24hr patch Place 1 patch (21 mg total) onto the skin daily. Patient not taking: Reported on 05/26/2022 05/17/22   Eugenie Filler, MD  pantoprazole (PROTONIX) 40 MG tablet Take 1 tablet (40 mg total) by mouth daily. Patient not taking: Reported on 05/26/2022 05/17/22   Eugenie Filler, MD    Physical Exam: Vitals:   05/27/22 1432 05/27/22 1530 05/27/22 1545 05/27/22 1600  BP:  102/71  126/76  Pulse:  (!) 102 100 (!) 103  Resp:  (!) 22 17 15   Temp: 98 F (36.7 C)     TempSrc: Oral     SpO2:  99% 99% 99%  Weight:      Height:       General:  Appears somewhat uncomfortable and confused Eyes:   EOMI, normal lids, iris ENT:  grossly normal hearing, lips & tongue, mmm; suboptimal dentition Neck:  no LAD, masses or thyromegaly Cardiovascular:  RR with mild tachycardia, no m/r/g. No LE edema.  Respiratory:   CTA bilaterally with no wheezes/rales/rhonchi.  Normal respiratory effort. Abdomen:  soft, NT, ND Skin:  no rash or induration seen on limited exam, wound vac in place on R groin/thigh; B AKA scars are healing well Musculoskeletal:  s/p B AKA; L hand with apparent weakness and atrophy Psychiatric:  grossly normal mood and affect, speech fluent and appropriate, AOx3 but with ?mild cognitive impairment Neurologic:  CN 2-12 grossly intact, moves all extremities in coordinated fashion   Radiological Exams on Admission: Independently reviewed - see discussion in A/P where applicable  CT HEAD WO CONTRAST (5MM)  Result Date: 05/27/2022 CLINICAL DATA:  Mental status change. EXAM: CT HEAD WITHOUT CONTRAST TECHNIQUE: Contiguous axial images were obtained from the base of the skull through the vertex without intravenous contrast. RADIATION DOSE REDUCTION: This exam was performed according to the departmental dose-optimization program which includes automated exposure control, adjustment of the mA and/or kV according to patient size and/or use of iterative reconstruction  technique. COMPARISON:  None Available. FINDINGS: Brain: Generalized parenchymal volume loss with commensurate dilatation of the ventricles and sulci. Chronic small vessel ischemic changes within the deep periventricular white matter regions bilaterally, and additional chronic ischemic changes within the bilateral basal ganglia regions. Additional small old infarcts within the pons and cerebellum. No mass, hemorrhage, edema or other evidence of acute parenchymal abnormality. No extra-axial hemorrhage. Vascular: Chronic calcified atherosclerotic changes of the large vessels at the skull base. No unexpected hyperdense vessel. Skull: Normal. Negative for fracture or focal lesion. Sinuses/Orbits: No  acute finding. Other: None. IMPRESSION: 1. No acute findings. No intracranial mass, hemorrhage or edema. 2. Chronic ischemic changes in the white matter, basal ganglia regions, pons and cerebellum. Electronically Signed   By: Franki Cabot M.D.   On: 05/27/2022 11:38   CT Angio Aortobifemoral W and/or Wo Contrast  Result Date: 05/27/2022 CLINICAL DATA:  Peripheral arterial disease, asymptomatic. Hematuria with low-grade fever. Recent bilateral below-the-knee amputation. EXAM: CT ANGIOGRAPHY OF ABDOMINAL AORTA WITH ILIOFEMORAL RUNOFF TECHNIQUE: Multidetector CT imaging of the abdomen, pelvis and lower extremities was performed using the standard protocol during bolus administration of intravenous contrast. Multiplanar CT image reconstructions and MIPs were obtained to evaluate the vascular anatomy. RADIATION DOSE REDUCTION: This exam was performed according to the departmental dose-optimization program which includes automated exposure control, adjustment of the mA and/or kV according to patient size and/or use of iterative reconstruction technique. CONTRAST:  49mL OMNIPAQUE IOHEXOL 350 MG/ML SOLN COMPARISON:  03/08/2022. FINDINGS: VASCULAR Aorta: Aortic atherosclerosis. There is evidence of aortobifemoral bypass grafts.  The aorta is patent. Fluid attenuation and fat stranding are noted in the retroperitoneum, expanding the perigraft soft tissue. No associated gas is seen within the fluid. No significant stenosis, aneurysm, or dissection. Celiac: Patent without evidence of aneurysm, dissection, vasculitis or significant stenosis. SMA: Patent without evidence of aneurysm, dissection, vasculitis or significant stenosis. Renals: Both renal arteries are patent without evidence of aneurysm, dissection, vasculitis, fibromuscular dysplasia or significant stenosis. An accessory renal artery is noted on the left. IMA: Occluded proximally with reconstitution via collaterals. RIGHT Lower Extremity Inflow: Aortofemoral bypass graft is patent. Outflow: The bypass graft and common femoral and profunda femoral arteries are patent. There is occlusion of the superficial femoral artery. Runoff: Below-the-knee amputation. LEFT Lower Extremity Inflow: Aortofemoral bypass graft is patent. Outflow: The bypass graft, common femoral, and profunda femoral arteries are patent. There is narrowing with occlusion of the superficial femoral artery in the proximal thigh. Runoff: Below-the-knee amputation. Veins: No obvious venous abnormality within the limitations of this arterial phase study. Review of the MIP images confirms the above findings. NON-VASCULAR Lower chest: No acute abnormality. Hepatobiliary: No focal liver abnormality is seen. No gallstones, gallbladder wall thickening, or biliary dilatation. Pancreas: Unremarkable. No pancreatic ductal dilatation or surrounding inflammatory changes. Spleen: Normal in size. There is a stable hypervascular focus in the spleen, possible hemangioma. Adrenals/Urinary Tract: The adrenal glands are within normal limits. The kidneys enhance symmetrically. No renal calculus or hydronephrosis. The bladder is unremarkable. Stomach/Bowel: Stomach is within normal limits. Appendix appears normal. No bowel obstruction. No  free air or pneumatosis. Moderate amount of retained stool is present in the colon. The rectum is distended with stool and there is mild rectal wall thickening with surrounding fat stranding. Lymphatic: There is a 1.8 cm soft tissue mass in the retroperitoneum on the left at the level of the lower pole of the left kidney. Reproductive: Prostate gland is enlarged. Other: Asymmetrically increased soft tissue fat stranding is noted in the right groin with associated prominent lymph nodes. Musculoskeletal: Bilateral below-the-knee amputation changes are noted in the lower extremities bilaterally. Surgical clips, fluid attenuation, and fat stranding is noted at the postsurgical sites. Evaluation for abscess is limited due to timing of contrast bolus. A fluid collection with surrounding hyperdense material is noted surrounding the distal femoral stump on the left. IMPRESSION: VASCULAR 1. Patent aortobifemoral bypass graft. Retroperitoneal fat stranding and fluid attenuation are noted surrounding the graft. No gas is identified. A 1.8 cm soft tissue density is noted  in the periaortic region on the left, possible enlarged lymph node. Findings may be related to history of recent surgery. The possibility of perigraft infection can not be excluded. Comparison with more recent imaging studies and short-term follow-up are recommended. 2. Occlusion of the superficial femoral arteries bilaterally. Profunda femoral arteries appear patent. NON-VASCULAR 1. Moderate amount of retained stool in the colon and large amount of stool in the rectum with mild rectal wall thickening and surrounding fat stranding, possible stercoral colitis. 2. Asymmetrically increased soft tissue fat stranding and edema in the right groin, may be postsurgical. The possibility of superimposed infection can not be excluded. 3. Status post bilateral below-the-knee amputation with edema and fat stranding at the postsurgical sites which may be postoperative. There  is a fluid collection with surrounding hyperdense material at the left femoral stump which may be postsurgical. Correlate clinically to exclude infection. 4. Remaining incidental findings as described above. Electronically Signed   By: Brett Fairy M.D.   On: 05/27/2022 00:30   DG Chest Port 1 View  Result Date: 05/26/2022 CLINICAL DATA:  Possible sepsis. EXAM: PORTABLE CHEST 1 VIEW COMPARISON:  04/18/2022. FINDINGS: The heart size and mediastinal contours are within normal limits. Both lungs are clear. No acute osseous abnormality. IMPRESSION: No active disease. Electronically Signed   By: Brett Fairy M.D.   On: 05/26/2022 23:11    EKG: Independently reviewed.  Sinus tachycardia with rate 101; no evidence of acute ischemia   Labs on Admission: I have personally reviewed the available labs and imaging studies at the time of the admission.  Pertinent labs:    Albumin 2.3 Lactate 0.8 WBC 13.9 Hgb 7.1; 8.7 on 9/13 COVID/flu negative UA: small Hgb, moderate LE, many bacteria Blood and urine cultures pending   Assessment and Plan: Principal Problem:   Sepsis secondary to UTI [facility]) Active Problems:   Hypertension   Critical limb ischemia of both lower extremities (H[facility])   PAD (peripheral artery disease) ([facility])   Acute postoperative anemia due to expected blood loss   Muscle weakness of left upper extremity   Tobacco dependence   Adjustment reaction   DNR (do not resuscitate)    Sepsis, likely from UTI -Patient with recent prolonged hospitalization and ultimately B AKA -SIRS criteria in this patient includes: Leukocytosis, tachycardia, tachypnea  -Patient has evidence of acute organ failure with encephalopathy that is not easily explained by another condition. -While awaiting blood cultures, this appears to be a preseptic condition. -Sepsis protocol initiated -Suspected source is currently UTI, although wound infection would be far more problematic -Blood and urine cultures  pending -Will admit due to: AMS that is severe or persistent; failure of outpatient treatment -Treat with IV Cefepime/Vanc for now -Will add HIV -Will order procalcitonin level.  Antibiotics would not be indicated for PCT <0.1 and probably should not be used for < 0.25.  >0.5 indicates infection and >>0.5 indicates more serious disease.  As the procalcitonin level normalizes, it will be reasonable to consider de-escalation of antibiotic coverage.  ABLA -Hgb 7.1, ?stercoral ulcer as the source vs. Blood loss from recent surgeries -Will provide bowel care -Transfuse 2 units PRBC and continue to follow Hgb -Sister was consented since the patient appears to have some encephalopathy  LUE weakness -He is not using his left hand well -His sister is unsure if this is new, may be residual from prior stroke -Will get head CT (unremarkable) -OT consulted  PVD with critical limb ischemia, s/p B AKA -Patient presented last  admission with critical limb ischemia bilateral lower extremities required several attempts at revascularization. -Patient subsequently underwent right AKA on 04/22/2022. -Vascular surgery/primary team recommended left AKA however patient initially declined. -Palliative care consulted and eventually patient decided to have left AKA which was done on 04/30/2022 per vascular surgery. -Continue aspirin, statin. -Continue right lower extremity wound VAC per vascular surgery. -Previously discharged to home since he was Medicaid pending but appears to need placement -Will order PT/OT/TOC team consults  HTN -Continue amlodipine, carvedilol, hydralazine  Tobacco dependence -Resumed smoking upon returning home -Cessation encouraged  -Patch ordered   Adjustment reaction -He does not appear to be coping well -He may benefit from initiation of SSRI therapy - will start sertraline 25 mg daily for now -His sister is in agreement with this plan   DNR -I have discussed code status with  the patient and his sister and  they are in agreement that the patient would not desire resuscitation and would prefer to die a natural death should that situation arise. -She feels like he has given up -He will need a gold out of facility DNR form at the time of discharge    Advance Care Planning:   Code Status: DNR   Consults: Vascular surgery; TOC team; nutrition; PT/OT/ST  DVT Prophylaxis: None due to concern for blood loss  Family Communication: None present; I spoke with his sister by telephone at the time of admission  Severity of Illness: Admit - It is my clinical opinion that admission to INPATIENT is reasonable and necessary because of the expectation that this patient will require hospital care that crosses at least 2 midnights to treat this condition based on the medical complexity of the problems presented.  Given the aforementioned information, the predictability of an adverse outcome is felt to be significant.   Author: Karmen Bongo, MD 05/27/2022 5:04 PM  For on call review [email or web address].

## 2022-05-27 NOTE — ED Notes (Addendum)
Unable to obtain urine sample attempted In and out Catheterization x3 without success. Condom cath placed on pt provider aware of delay.   Pt had medium sized soft stool, pt cleaned and placed in new brief

## 2022-05-27 NOTE — Progress Notes (Signed)
I reviewed the patient's CT scan after aortobifemoral bypass on 04/07/2022.  I do not see any evidence of graft infection including no gas or other enhancing fluid collection around the graft.  I saw the patient in the ED today and he has no abdominal pain.  His midline incision and left groin incision have completely healed.  Both of his above-knee amputations are healing.  The right groin has a muscle flap with a VAC in place.  This should be changed tomorrow with the assistance of wound care.  No definitive evidence of graft infection at this time.  Agree treating his urosepsis.  Marty Heck, MD [facility] Office: [place]

## 2022-05-27 NOTE — ED Notes (Signed)
[facility] triage nurse Caren Griffins, provided update on plan for admission. They can be contacted at time of discharge to facilitate transport at [phone]

## 2022-05-27 NOTE — Progress Notes (Signed)
Pharmacy Antibiotic Note  Jeffrey Fitzgerald is a 51 y.o. male admitted on 05/26/2022 with sepsis.  Pharmacy has been consulted for Cefepime and Vancomycin dosing. Patient presented with fever of 100.5 today and abdominal pain. PMH of BLE amputations and endarterectomy. WBC 13.9, low grade fever 100.3, Scr 1.22 (bl~1.3-1.7).  Plan: Vancomycin 1500mg  x1 dose Vancomycin 1250 mg IV every 24 hours. (eAUC 465 IBW, Vd 0.72) Cefepime 2g q8 hours  Ceftriaxone 1g x1 dose per MD at 0215 Monitor for signs of clinical improvement, fever curve, WBC, renal function, and cultures  Weight: 65.8 kg (145 lb)  Temp (24hrs), Avg:99.3 F (37.4 C), Min:98.8 F (37.1 C), Max:100.3 F (37.9 C)  Recent Labs  Lab 05/26/22 2345  WBC 13.9*  CREATININE 1.22  LATICACIDVEN 0.8    Estimated Creatinine Clearance: 63 mL/min (by C-G formula based on SCr of 1.22 mg/dL).    No Known Allergies  Antimicrobials this admission: Cefepime 9/28 >>  Vancomycin 9/28 >>   Microbiology results: 9/27 BCx: pending 9/28 UCx: pending   Thank you for allowing pharmacy to be a part of this patient's care.  Sandford Craze, PharmD. [facility] Acute Care PGY-1  05/27/2022 7:45 AM

## 2022-05-27 NOTE — Consult Note (Signed)
[facility]  Patient name: Jeffrey Fitzgerald MRN: [id number] DOB: 1971/03/07 Sex: male   REQUESTING PROVIDER:    ER   REASON FOR CONSULT:    Infection  HISTORY OF PRESENT ILLNESS:   Jeffrey Fitzgerald is a 51 y.o. male, who initially presented to the office in August 2023 with critical limb ischemia to the left leg.  He had rest pain as well as tissue loss.  His symptoms been going on for many years.  His wound has been present for about a month.  For limb salvage she underwent the following procedures:  04/07/2022: Aortobifemoral bypass graft with left femoral to posterior tibial bypass using composite graft 04/07/2022 takeback for right leg ischemia and right femoral endarterectomy 04/08/2022: Right femoral to posterior tibial bypass graft with composite graft 04/09/2022: Thrombectomy of left femoral tibial bypass with right leg fasciotomy 04/20/2022: Washout of right leg fascia 04/20/2022: Muscle flap to right groin for bleeding 04/22/2022: Right above-knee amputation with wound VAC and myriad powder to right groin 04/30/2022: Left above-knee amputation  He presented to the emergency department by home health with fevers.  The patient was found to have a urinary tract infection and has been started on antibiotics and admitted to the hospital. PAST MEDICAL HISTORY    Past Medical History:  Diagnosis Date   Hypertension    Peripheral vascular disease ([place])    Tobacco abuse      FAMILY HISTORY   Family History  Problem Relation Age of Onset   Hypertension Father    Heart disease Father        No details.  Died in his earlies 70s and might have had heart attacks or coronary disease prior to that.    SOCIAL HISTORY:   Social History   Socioeconomic History   Marital status: Single    Spouse name: Not on file   Number of children: Not on file   Years of education: Not on file   Highest education level: Not on  file  Occupational History   Not on file  Tobacco Use   Smoking status: Every Day    Packs/day: 0.50    Years: 30.00    Total pack years: 15.00    Types: Cigarettes   Smokeless tobacco: Never  Vaping Use   Vaping Use: Never used  Substance and Sexual Activity   Alcohol use: Not Currently    Comment: 04/06/22- 1 LIter a week   Drug use: Yes    Types: Marijuana   Sexual activity: Not on file  Other Topics Concern   Not on file  Social History Narrative   He is currently living with his mom.  He still smokes cigarettes.  Used to work in a [employer] .   Social Determinants of Health   Financial Resource Strain: Not on file  Food Insecurity: Not on file  Transportation Needs: Not on file  Physical Activity: Not on file  Stress: Not on file  Social Connections: Not on file  Intimate Partner Violence: Not on file    ALLERGIES:    No Known Allergies  CURRENT MEDICATIONS:    Current Facility-Administered Medications  Medication Dose Route Frequency Provider Last Rate Last Admin   ceFEPIme (MAXIPIME) 2 g in sodium chloride 0.9 % 100 mL IVPB  2 g Intravenous Q8H Sandford Craze, RPH       [START ON 05/28/2022] vancomycin (VANCOREADY) IVPB 1250 mg/250 mL  1,250 mg Intravenous Q24H Sandford Craze, [facility]  vancomycin (VANCOREADY) IVPB 1500 mg/300 mL  1,500 mg Intravenous Once Sandford Craze, [facility]       Current Outpatient Medications  Medication Sig Dispense Refill   amLODipine (NORVASC) 10 MG tablet Take 1 tablet (10 mg total) by mouth daily. 30 tablet 1   aspirin EC 81 MG tablet Take 1 tablet (81 mg total) by mouth daily. Swallow whole. 30 tablet 12   atorvastatin (LIPITOR) 80 MG tablet Take 1 tablet (80 mg total) by mouth daily. 30 tablet 1   carvedilol (COREG) 6.25 MG tablet Take 1 tablet (6.25 mg total) by mouth 2 (two) times daily with a meal. 60 tablet 1   hydrALAZINE (APRESOLINE) 25 MG tablet Take 1 tablet (25 mg total) by mouth 2 (two) times daily. 60 tablet 1   Nystatin  (GERHARDT'S BUTT CREAM) CREA Apply 1 Application topically as needed for irritation (apply to periarea as needed for excoriation). 1 each 0   acetaminophen (TYLENOL) 500 MG tablet Take 500 mg by mouth every 6 (six) hours as needed for moderate pain.     nicotine (NICODERM CQ - DOSED IN MG/24 HOURS) 21 mg/24hr patch Place 1 patch (21 mg total) onto the skin daily. (Patient not taking: Reported on 05/26/2022) 28 patch 0   pantoprazole (PROTONIX) 40 MG tablet Take 1 tablet (40 mg total) by mouth daily. (Patient not taking: Reported on 05/26/2022) 30 tablet 1    REVIEW OF SYSTEMS:   _0  denotes positive finding, _1  denotes negative finding Cardiac  Comments:  Chest pain or chest pressure:    Shortness of breath upon exertion:    Short of breath when lying flat:    Irregular heart rhythm:        Vascular    Pain in calf, thigh, or hip brought on by ambulation:    Pain in feet at night that wakes you up from your sleep:     Blood clot in your veins:    Leg swelling:         Pulmonary    Oxygen at home:    Productive cough:     Wheezing:         Neurologic    Sudden weakness in arms or legs:     Sudden numbness in arms or legs:     Sudden onset of difficulty speaking or slurred speech:    Temporary loss of vision in one eye:     Problems with dizziness:         Gastrointestinal    Blood in stool:      Vomited blood:         Genitourinary    Burning when urinating:     Blood in urine: x       Psychiatric    Major depression:         Hematologic    Bleeding problems:    Problems with blood clotting too easily:        Skin    Rashes or ulcers:        Constitutional    Fever or chills:     PHYSICAL EXAM:   Vitals:   05/27/22 0519 05/27/22 0600 05/27/22 0616 05/27/22 0645  BP:  132/67  139/72  Pulse:  (!) 113 100 (!) 103  Resp:  _2 Temp: 100.3 F (37.9 C)     TempSrc: Oral     SpO2:  99% 99% 100%  Weight:        GENERAL: The  patient is a well-nourished  male, in no acute distress. The vital signs are documented above. CARDIAC: There is a regular rate and rhythm.  VASCULAR: Right groin wound VAC in place with good seal PULMONARY: Nonlabored respirations ABDOMEN: Soft and non-tender to palpation MUSCULOSKELETAL: Bilateral above-knee amputation NEUROLOGIC: No focal weakness or paresthesias are detected. SKIN: There are no ulcers or rashes noted. PSYCHIATRIC: The patient has a normal affect.  STUDIES:   I have reviewed the CT scan with the following findings: VASCULAR   1. Patent aortobifemoral bypass graft. Retroperitoneal fat stranding and fluid attenuation are noted surrounding the graft. No gas is identified. A 1.8 cm soft tissue density is noted in the periaortic region on the left, possible enlarged lymph node. Findings may be related to history of recent surgery. The possibility of perigraft infection can not be excluded. Comparison with more recent imaging studies and short-term follow-up are recommended. 2. Occlusion of the superficial femoral arteries bilaterally. Profunda femoral arteries appear patent.   NON-VASCULAR   1. Moderate amount of retained stool in the colon and large amount of stool in the rectum with mild rectal wall thickening and surrounding fat stranding, possible stercoral colitis. 2. Asymmetrically increased soft tissue fat stranding and edema in the right groin, may be postsurgical. The possibility of superimposed infection can not be excluded. 3. Status post bilateral below-the-knee amputation with edema and fat stranding at the postsurgical sites which may be postoperative. There is a fluid collection with surrounding hyperdense material at the left femoral stump which may be postsurgical. Correlate clinically to exclude infection. 4. Remaining incidental findings as described above.   ASSESSMENT and PLAN   I suspect that the patient's fevers are related to his urinary tract infection.  I would  like for this to resolve before presuming vascular graft infection.  Based off of his CT scan I think a lot of the findings described can be explained by the fact that he is in the perioperative period.  There is no gas in the right groin or surrounding aorta.  There is no definitive evidence of graft infection.  I would recommend continuing antibiotics and likely repeat imaging.  If the patient did have evidence of a graft infection, explant would be highly morbid and likely not recommended.  I will notify Dr. Carlis Abbott of the patient's admission and he will follow-up tomorrow.   Leia Alf, MD, FACS [facility] [phone] Pager [phone]

## 2022-05-27 NOTE — ED Notes (Signed)
Patient refusing to sign blood consent at this time until his sister is contacted. This RN paged admitting provider and let them know of same.

## 2022-05-28 DIAGNOSIS — I10 Essential (primary) hypertension: Secondary | ICD-10-CM

## 2022-05-28 DIAGNOSIS — M6281 Muscle weakness (generalized): Secondary | ICD-10-CM

## 2022-05-28 DIAGNOSIS — Z66 Do not resuscitate: Secondary | ICD-10-CM

## 2022-05-28 DIAGNOSIS — F17200 Nicotine dependence, unspecified, uncomplicated: Secondary | ICD-10-CM

## 2022-05-28 DIAGNOSIS — I70223 Atherosclerosis of native arteries of extremities with rest pain, bilateral legs: Secondary | ICD-10-CM

## 2022-05-28 DIAGNOSIS — I739 Peripheral vascular disease, unspecified: Secondary | ICD-10-CM

## 2022-05-28 DIAGNOSIS — D62 Acute posthemorrhagic anemia: Secondary | ICD-10-CM

## 2022-05-28 DIAGNOSIS — F4329 Adjustment disorder with other symptoms: Secondary | ICD-10-CM

## 2022-05-28 LAB — CBC WITH DIFFERENTIAL/PLATELET
Abs Immature Granulocytes: 0.05 10*3/uL (ref 0.00–0.07)
Basophils Absolute: 0 10*3/uL (ref 0.0–0.1)
Basophils Relative: 0 %
Eosinophils Absolute: 0 10*3/uL (ref 0.0–0.5)
Eosinophils Relative: 0 %
HCT: 35.5 % — ABNORMAL LOW (ref 39.0–52.0)
Hemoglobin: 12.1 g/dL — ABNORMAL LOW (ref 13.0–17.0)
Immature Granulocytes: 0 %
Lymphocytes Relative: 16 %
Lymphs Abs: 1.9 10*3/uL (ref 0.7–4.0)
MCH: 28.8 pg (ref 26.0–34.0)
MCHC: 34.1 g/dL (ref 30.0–36.0)
MCV: 84.5 fL (ref 80.0–100.0)
Monocytes Absolute: 0.8 10*3/uL (ref 0.1–1.0)
Monocytes Relative: 7 %
Neutro Abs: 9.3 10*3/uL — ABNORMAL HIGH (ref 1.7–7.7)
Neutrophils Relative %: 77 %
Platelets: 296 10*3/uL (ref 150–400)
RBC: 4.2 MIL/uL — ABNORMAL LOW (ref 4.22–5.81)
RDW: 16.8 % — ABNORMAL HIGH (ref 11.5–15.5)
WBC: 12 10*3/uL — ABNORMAL HIGH (ref 4.0–10.5)
nRBC: 0 % (ref 0.0–0.2)

## 2022-05-28 LAB — TYPE AND SCREEN
ABO/RH(D): A POS
Antibody Screen: NEGATIVE
Unit division: 0
Unit division: 0

## 2022-05-28 LAB — GLUCOSE, CAPILLARY: Glucose-Capillary: 109 mg/dL — ABNORMAL HIGH (ref 70–99)

## 2022-05-28 LAB — BPAM RBC
Blood Product Expiration Date: 202310132359
Blood Product Expiration Date: 202310132359
ISSUE DATE / TIME: 202309281930
ISSUE DATE / TIME: 202309282244
Unit Type and Rh: 6200
Unit Type and Rh: 6200

## 2022-05-28 LAB — BASIC METABOLIC PANEL
Anion gap: 9 (ref 5–15)
BUN: 13 mg/dL (ref 6–20)
CO2: 24 mmol/L (ref 22–32)
Calcium: 8.8 mg/dL — ABNORMAL LOW (ref 8.9–10.3)
Chloride: 105 mmol/L (ref 98–111)
Creatinine, Ser: 0.97 mg/dL (ref 0.61–1.24)
GFR, Estimated: >60 mL/min (ref >60)
Glucose, Bld: 109 mg/dL — ABNORMAL HIGH (ref 70–99)
Potassium: 3.4 mmol/L — ABNORMAL LOW (ref 3.5–5.1)
Sodium: 138 mmol/L (ref 135–145)

## 2022-05-28 LAB — HIV ANTIBODY (ROUTINE TESTING W REFLEX): HIV Screen 4th Generation wRfx: NONREACTIVE

## 2022-05-28 MED ORDER — POTASSIUM CHLORIDE CRYS ER 20 MEQ PO TBCR
20.0000 meq | EXTENDED_RELEASE_TABLET | Freq: Once | ORAL | Status: AC
  Administered 2022-05-28: 20 meq via ORAL
  Filled 2022-05-28: qty 1

## 2022-05-28 MED ORDER — ADULT MULTIVITAMIN W/MINERALS CH
1.0000 | ORAL_TABLET | Freq: Every day | ORAL | Status: DC
  Administered 2022-05-28 – 2022-06-04 (×7): 1 via ORAL
  Filled 2022-05-28 (×8): qty 1

## 2022-05-28 MED ORDER — ENSURE ENLIVE PO LIQD
237.0000 mL | Freq: Three times a day (TID) | ORAL | Status: DC
  Administered 2022-05-29 – 2022-06-04 (×11): 237 mL via ORAL

## 2022-05-28 NOTE — Progress Notes (Signed)
Pt admitted to rm 26 from ED. CHG wipe given. Initiated tele. VSS. Call bell within reach.   Lavenia Atlas, RN

## 2022-05-28 NOTE — Progress Notes (Addendum)
Progress Note    05/28/2022 8:59 AM * No surgery found *  Subjective:  denies any pain, resting comfortably    Vitals:   05/28/22 0634 05/28/22 0800  BP: (!) 122/105 139/89  Pulse: (!) 108 (!) 111  Resp: 18 17  Temp: 98.1 F (36.7 C) 98.2 F (36.8 C)  SpO2: 99% 100%    Physical Exam: Lungs:  nonlabored Incisions:  midline and left groin incisions healed. R groin with wound vac. Staples removed bilateral AKAs. R AKA with two small areas of dehiscence medially. L AKA with no signs of infection or dehiscence. L thigh incision with small area of dehiscence, no signs of infection.   CBC    Component Value Date/Time   WBC 12.0 (H) 05/28/2022 0817   RBC 4.20 (L) 05/28/2022 0817   HGB 12.1 (L) 05/28/2022 0817   HCT 35.5 (L) 05/28/2022 0817   PLT 296 05/28/2022 0817   MCV 84.5 05/28/2022 0817   MCH 28.8 05/28/2022 0817   MCHC 34.1 05/28/2022 0817   RDW 16.8 (H) 05/28/2022 0817   LYMPHSABS 1.9 05/28/2022 0817   MONOABS 0.8 05/28/2022 0817   EOSABS 0.0 05/28/2022 0817   BASOSABS 0.0 05/28/2022 0817    BMET    Component Value Date/Time   NA 138 05/28/2022 0817   K 3.4 (L) 05/28/2022 0817   CL 105 05/28/2022 0817   CO2 24 05/28/2022 0817   GLUCOSE 109 (H) 05/28/2022 0817   BUN 13 05/28/2022 0817   CREATININE 0.97 05/28/2022 0817   CALCIUM 8.8 (L) 05/28/2022 0817   GFRNONAA >60 05/28/2022 0817   GFRAA >60 01/12/2020 1421    INR    Component Value Date/Time   INR 1.1 05/26/2022 2345     Intake/Output Summary (Last 24 hours) at 05/28/2022 0859 Last data filed at 05/28/2022 0154 Gross per 24 hour  Intake 682 ml  Output 475 ml  Net 207 ml     Assessment/Plan:  51 y.o. male with hx of aortobifem bypass on 8/9, R AKA on 8/24, and L AKA on 9/1   -Midline and L groin incisions have healed well. -R groin incision with wound vac. WOC team has changed vac this AM. MWF changes can be done by bedside nursing -Staples removed from bilateral AKAs. L AKA healing  well with no signs of infection. R AKA with two small areas of dehiscence medially. Small amount of pus can be expressed from dehiscence. Both areas have been packed with iodoform packing strips and then covered with dry gauze dressing. -Small area of dehiscence on L thigh incision, no signs of infection. Has been covered with wet to dry dressing -Patient is on broad spectrum abx for urosepsis -Beside nursing has orders to change L thigh wet to dry dressing daily and iodoform packing changes of the R AKA daily. Our team will monitor   Loel Dubonnet, PA-C [facility] [phone] 05/28/2022 8:59 AM  I have seen and evaluated the patient. I agree with the PA note as documented above.  Patient is status post aortobifem with bilateral AKA's readmitted with fever and likely urosepsis.  Midline and left groin incision are healed.  Right groin VAC changed today with sartorius muscle flap and this is looking good.  Staples were removed from bilateral AKA's.  He does have 2 areas of dehiscence on the right AKA that has some drainage looking like fat necrosis.  We have packed these with iodoform quarter inch gauze and we will put an order  in for dressing changes daily.  Vascular will continue to follow.  No growth on blood cultures.  No evidence of graft infection at this time.  Marty Heck, MD [facility] [place] Office: [phone]

## 2022-05-28 NOTE — ED Notes (Signed)
Pt in room sleeping NAD chest rising and falling. Pt A&O x4. Updated on plan of care.

## 2022-05-28 NOTE — Progress Notes (Signed)
Progress Note  Patient: Jeffrey Fitzgerald [id number] DOB: 1971-03-25  DOA: 05/26/2022  DOS: 05/28/2022    Brief hospital course: Jeffrey Fitzgerald is a 51 y.o. male with medical history significant of HTN, PAD with recent prolonged admission for BLE critical limb ischemia ultimately undergoing bilateral AKA, discharged home with wound vac 9/20 who presented to the ED 9/27 feeling poorly with poor per oral intake, urinary symptoms, possible hematuria, and fevers documented by family/HH RN. He was found to be febrile with urinalysis suggestive of UTI. Also changes on CT around the aortic graft for which vascular surgery was consulted, feeling this is expected appearance postoperatively. No wound or graft infection currently noted.    Assessment and Plan: Sepsis due to UTI: With leukocytosis, tachycardia, tachypnea, encephalopathy - Continue broad antimicrobial coverage with recent protracted hospital stay (8/3 - 9/20) - Monitor urine culture (E. coli, Klebsiella thus far) and blood cultures (NGTD)  ABLA: s/p 2u PRBCs on admission.  - Hgb with robust response (?spurious), will monitor closely  Acute encephalopathy: Pt more withdrawn from baseline per report.  - Delirium precautions  PAD s/p aortobifemoral bypass with B femoral endarterectomy with poor healing and other surgical complications eventually resulting in R AKA and then L AKA. - Blood cultures negative, low suspicion for graft infection at this time. Appreciate vascular surgery following the patient. - Continue wound vac changes MWF per vascular surgery - Fat necrosis suspected at site of AKA, iodoform placed, staples removed 9/29, will change wound dressing daily.  - Continue ASA, statin  Failure to thrive, debility s/p bilateral AKA: Strongly suspect he will continue to be high readmission risk if not discharged to SNF. Discharged home previously due to no payor source/medicaid pending.  - PT/OT/TOC consulted.  Left arm  weakness: No acute change on neuroimaging.  - PT/OT  HTN:  - Continue norvasc, coreg, hydralazine as prescribed at recent discharge  Tobacco use:  - Cessation counseling, nicotine patch  Adjustment reaction:  - SSRI started 9/28, will monitor for tolerance, efficacy  DNR: POA.   Hypokalemia:  - Supplement and monitor.  Subjective: Pt withdrawn, denies pain or other concerns. He's soiled himself with no hematuria noted.   Objective: Vitals:   05/28/22 1212 05/28/22 1246 05/28/22 1416 05/28/22 1434  BP:  130/82 132/76   Pulse:  90 96   Resp:  17 19   Temp: 97.9 F (36.6 C)  98.1 F (36.7 C)   TempSrc: Oral  Oral   SpO2:  100% 100%   Weight:    48.4 kg  Height:    5\' 5"  (1.651 m)   Gen: 51 y.o. male in no distress Pulm: Nonlabored breathing room air. Clear CV: Regular rate and rhythm. No murmur, rub, or gallop. No JVD, no dependent edema on hip. GI: Abdomen soft, non-tender, non-distended, with normoactive bowel sounds.  Ext: Warm, dry with bilateral AKAs with staples in place, some discharge from right stump site, no significant erythema. Skin: No other rashes, lesions or ulcers on visualized skin. Neuro: Alert and oriented. No focal neurological deficits. Psych: Calm  Data Personally reviewed: CBC: Recent Labs  Lab 05/26/22 2345 05/28/22 0817  WBC 13.9* 12.0*  NEUTROABS 10.3* 9.3*  HGB 7.1* 12.1*  HCT 22.5* 35.5*  MCV 90.4 84.5  PLT 299 0000000   Basic Metabolic Panel: Recent Labs  Lab 05/26/22 2345 05/28/22 0817  NA 135 138  K 3.5 3.4*  CL 103 105  CO2 23 24  GLUCOSE 101* 109*  BUN 12 13  CREATININE 1.22 0.97  CALCIUM 8.2* 8.8*   GFR: Estimated Creatinine Clearance: 62.4 mL/min (by C-G formula based on SCr of 0.97 mg/dL). Liver Function Tests: Recent Labs  Lab 05/26/22 2345  AST 19  ALT 22  ALKPHOS 110  BILITOT 0.6  PROT 6.0*  ALBUMIN 2.3*   No results for input(s): "LIPASE", "AMYLASE" in the last 168 hours. No results for input(s):  "AMMONIA" in the last 168 hours. Coagulation Profile: Recent Labs  Lab 05/26/22 2345  INR 1.1   Cardiac Enzymes: No results for input(s): "CKTOTAL", "CKMB", "CKMBINDEX", "TROPONINI" in the last 168 hours. BNP (last 3 results) No results for input(s): "PROBNP" in the last 8760 hours. HbA1C: No results for input(s): "HGBA1C" in the last 72 hours. CBG: No results for input(s): "GLUCAP" in the last 168 hours. Lipid Profile: No results for input(s): "CHOL", "HDL", "LDLCALC", "TRIG", "CHOLHDL", "LDLDIRECT" in the last 72 hours. Thyroid Function Tests: No results for input(s): "TSH", "T4TOTAL", "FREET4", "T3FREE", "THYROIDAB" in the last 72 hours. Anemia Panel: No results for input(s): "VITAMINB12", "FOLATE", "FERRITIN", "TIBC", "IRON", "RETICCTPCT" in the last 72 hours. Urine analysis:    Component Value Date/Time   COLORURINE YELLOW 05/27/2022 0120   APPEARANCEUR HAZY (A) 05/27/2022 0120   LABSPEC 1.025 05/27/2022 0120   PHURINE 6.0 05/27/2022 0120   GLUCOSEU NEGATIVE 05/27/2022 0120   HGBUR SMALL (A) 05/27/2022 0120   BILIRUBINUR NEGATIVE 05/27/2022 0120   KETONESUR NEGATIVE 05/27/2022 0120   PROTEINUR NEGATIVE 05/27/2022 0120   NITRITE NEGATIVE 05/27/2022 0120   LEUKOCYTESUR MODERATE (A) 05/27/2022 0120   Recent Results (from the past 240 hour(s))  Blood Culture (routine x 2)     Status: None (Preliminary result)   Collection Time: 05/26/22 11:45 PM   Specimen: BLOOD LEFT FOREARM  Result Value Ref Range Status   Specimen Description BLOOD LEFT FOREARM  Final   Special Requests   Final    BOTTLES DRAWN AEROBIC AND ANAEROBIC Blood Culture adequate volume   Culture   Final    NO GROWTH 1 DAY Performed at [facility], [place] [address], [place], [place] [address]    Report Status PENDING  Incomplete  Blood Culture (routine x 2)     Status: None (Preliminary result)   Collection Time: 05/26/22 11:45 PM   Specimen: BLOOD  Result Value Ref Range Status   Specimen  Description BLOOD RIGHT ANTECUBITAL  Final   Special Requests   Final    BOTTLES DRAWN AEROBIC AND ANAEROBIC Blood Culture results may not be optimal due to an inadequate volume of blood received in culture bottles   Culture   Final    NO GROWTH 1 DAY Performed at [facility], [place] [address], [place], [place] [address]    Report Status PENDING  Incomplete  Resp Panel by RT-PCR (Flu A&B, Covid)     Status: None   Collection Time: 05/26/22 11:45 PM   Specimen: Nasal Swab  Result Value Ref Range Status   SARS Coronavirus 2 by RT PCR NEGATIVE NEGATIVE Final    Comment: (NOTE) SARS-CoV-2 target nucleic acids are NOT DETECTED.  The SARS-CoV-2 RNA is generally detectable in upper respiratory specimens during the acute phase of infection. The lowest concentration of SARS-CoV-2 viral copies this assay can detect is 138 copies/mL. A negative result does not preclude SARS-Cov-2 infection and should not be used as the sole basis for treatment or other patient management decisions. A negative result may occur with  improper specimen collection/handling, submission  of specimen other than nasopharyngeal swab, presence of viral mutation(s) within the areas targeted by this assay, and inadequate number of viral copies(<138 copies/mL). A negative result must be combined with clinical observations, patient history, and epidemiological information. The expected result is Negative.  Fact Sheet for Patients:  EntrepreneurPulse.com.au  Fact Sheet for Healthcare Providers:  IncredibleEmployment.be  This test is no t yet approved or cleared by the [place] FDA and  has been authorized for detection and/or diagnosis of SARS-CoV-2 by FDA under an Emergency Use Authorization (EUA). This EUA will remain  in effect (meaning this test can be used) for the duration of the COVID-19 declaration under Section 564(b)(1) of the Act, 21 U.S.C.section 360bbb-3(b)(1),  unless the authorization is terminated  or revoked sooner.       Influenza A by PCR NEGATIVE NEGATIVE Final   Influenza B by PCR NEGATIVE NEGATIVE Final    Comment: (NOTE) The Xpert Xpress SARS-CoV-2/FLU/RSV plus assay is intended as an aid in the diagnosis of influenza from Nasopharyngeal swab specimens and should not be used as a sole basis for treatment. Nasal washings and aspirates are unacceptable for Xpert Xpress SARS-CoV-2/FLU/RSV testing.  Fact Sheet for Patients: EntrepreneurPulse.com.au  Fact Sheet for Healthcare Providers: IncredibleEmployment.be  This test is not yet approved or cleared by the [place] FDA and has been authorized for detection and/or diagnosis of SARS-CoV-2 by FDA under an Emergency Use Authorization (EUA). This EUA will remain in effect (meaning this test can be used) for the duration of the COVID-19 declaration under Section 564(b)(1) of the Act, 21 U.S.C. section 360bbb-3(b)(1), unless the authorization is terminated or revoked.  Performed at [facility], [place] [address], [place], [place] [address]   Urine Culture     Status: Abnormal (Preliminary result)   Collection Time: 05/27/22  1:20 AM   Specimen: In/Out Cath Urine  Result Value Ref Range Status   Specimen Description IN/OUT CATH URINE  Final   Special Requests NONE  Final   Culture (A)  Final    >=100,000 COLONIES/mL ESCHERICHIA COLI >=100,000 COLONIES/mL KLEBSIELLA OXYTOCA SUSCEPTIBILITIES TO FOLLOW Performed at [facility], [place] [address] [place], [place] [address]    Report Status PENDING  Incomplete     CT HEAD WO CONTRAST (5MM)  Result Date: 05/27/2022 CLINICAL DATA:  Mental status change. EXAM: CT HEAD WITHOUT CONTRAST TECHNIQUE: Contiguous axial images were obtained from the base of the skull through the vertex without intravenous contrast. RADIATION DOSE REDUCTION: This exam was performed according to the departmental  dose-optimization program which includes automated exposure control, adjustment of the mA and/or kV according to patient size and/or use of iterative reconstruction technique. COMPARISON:  None Available. FINDINGS: Brain: Generalized parenchymal volume loss with commensurate dilatation of the ventricles and sulci. Chronic small vessel ischemic changes within the deep periventricular white matter regions bilaterally, and additional chronic ischemic changes within the bilateral basal ganglia regions. Additional small old infarcts within the pons and cerebellum. No mass, hemorrhage, edema or other evidence of acute parenchymal abnormality. No extra-axial hemorrhage. Vascular: Chronic calcified atherosclerotic changes of the large vessels at the skull base. No unexpected hyperdense vessel. Skull: Normal. Negative for fracture or focal lesion. Sinuses/Orbits: No acute finding. Other: None. IMPRESSION: 1. No acute findings. No intracranial mass, hemorrhage or edema. 2. Chronic ischemic changes in the white matter, basal ganglia regions, pons and cerebellum. Electronically Signed   By: Franki Cabot M.D.   On: 05/27/2022 11:38   CT Angio  Aortobifemoral W and/or Wo Contrast  Result Date: 05/27/2022 CLINICAL DATA:  Peripheral arterial disease, asymptomatic. Hematuria with low-grade fever. Recent bilateral below-the-knee amputation. EXAM: CT ANGIOGRAPHY OF ABDOMINAL AORTA WITH ILIOFEMORAL RUNOFF TECHNIQUE: Multidetector CT imaging of the abdomen, pelvis and lower extremities was performed using the standard protocol during bolus administration of intravenous contrast. Multiplanar CT image reconstructions and MIPs were obtained to evaluate the vascular anatomy. RADIATION DOSE REDUCTION: This exam was performed according to the departmental dose-optimization program which includes automated exposure control, adjustment of the mA and/or kV according to patient size and/or use of iterative reconstruction technique. CONTRAST:   75mL OMNIPAQUE IOHEXOL 350 MG/ML SOLN COMPARISON:  03/08/2022. FINDINGS: VASCULAR Aorta: Aortic atherosclerosis. There is evidence of aortobifemoral bypass grafts. The aorta is patent. Fluid attenuation and fat stranding are noted in the retroperitoneum, expanding the perigraft soft tissue. No associated gas is seen within the fluid. No significant stenosis, aneurysm, or dissection. Celiac: Patent without evidence of aneurysm, dissection, vasculitis or significant stenosis. SMA: Patent without evidence of aneurysm, dissection, vasculitis or significant stenosis. Renals: Both renal arteries are patent without evidence of aneurysm, dissection, vasculitis, fibromuscular dysplasia or significant stenosis. An accessory renal artery is noted on the left. IMA: Occluded proximally with reconstitution via collaterals. RIGHT Lower Extremity Inflow: Aortofemoral bypass graft is patent. Outflow: The bypass graft and common femoral and profunda femoral arteries are patent. There is occlusion of the superficial femoral artery. Runoff: Below-the-knee amputation. LEFT Lower Extremity Inflow: Aortofemoral bypass graft is patent. Outflow: The bypass graft, common femoral, and profunda femoral arteries are patent. There is narrowing with occlusion of the superficial femoral artery in the proximal thigh. Runoff: Below-the-knee amputation. Veins: No obvious venous abnormality within the limitations of this arterial phase study. Review of the MIP images confirms the above findings. NON-VASCULAR Lower chest: No acute abnormality. Hepatobiliary: No focal liver abnormality is seen. No gallstones, gallbladder wall thickening, or biliary dilatation. Pancreas: Unremarkable. No pancreatic ductal dilatation or surrounding inflammatory changes. Spleen: Normal in size. There is a stable hypervascular focus in the spleen, possible hemangioma. Adrenals/Urinary Tract: The adrenal glands are within normal limits. The kidneys enhance symmetrically. No  renal calculus or hydronephrosis. The bladder is unremarkable. Stomach/Bowel: Stomach is within normal limits. Appendix appears normal. No bowel obstruction. No free air or pneumatosis. Moderate amount of retained stool is present in the colon. The rectum is distended with stool and there is mild rectal wall thickening with surrounding fat stranding. Lymphatic: There is a 1.8 cm soft tissue mass in the retroperitoneum on the left at the level of the lower pole of the left kidney. Reproductive: Prostate gland is enlarged. Other: Asymmetrically increased soft tissue fat stranding is noted in the right groin with associated prominent lymph nodes. Musculoskeletal: Bilateral below-the-knee amputation changes are noted in the lower extremities bilaterally. Surgical clips, fluid attenuation, and fat stranding is noted at the postsurgical sites. Evaluation for abscess is limited due to timing of contrast bolus. A fluid collection with surrounding hyperdense material is noted surrounding the distal femoral stump on the left. IMPRESSION: VASCULAR 1. Patent aortobifemoral bypass graft. Retroperitoneal fat stranding and fluid attenuation are noted surrounding the graft. No gas is identified. A 1.8 cm soft tissue density is noted in the periaortic region on the left, possible enlarged lymph node. Findings may be related to history of recent surgery. The possibility of perigraft infection can not be excluded. Comparison with more recent imaging studies and short-term follow-up are recommended. 2. Occlusion of the superficial femoral arteries  bilaterally. Profunda femoral arteries appear patent. NON-VASCULAR 1. Moderate amount of retained stool in the colon and large amount of stool in the rectum with mild rectal wall thickening and surrounding fat stranding, possible stercoral colitis. 2. Asymmetrically increased soft tissue fat stranding and edema in the right groin, may be postsurgical. The possibility of superimposed infection  can not be excluded. 3. Status post bilateral below-the-knee amputation with edema and fat stranding at the postsurgical sites which may be postoperative. There is a fluid collection with surrounding hyperdense material at the left femoral stump which may be postsurgical. Correlate clinically to exclude infection. 4. Remaining incidental findings as described above. Electronically Signed   By: Brett Fairy M.D.   On: 05/27/2022 00:30   DG Chest Port 1 View  Result Date: 05/26/2022 CLINICAL DATA:  Possible sepsis. EXAM: PORTABLE CHEST 1 VIEW COMPARISON:  04/18/2022. FINDINGS: The heart size and mediastinal contours are within normal limits. Both lungs are clear. No acute osseous abnormality. IMPRESSION: No active disease. Electronically Signed   By: Brett Fairy M.D.   On: 05/26/2022 23:11    Family Communication: None at bedside, pt tells me not to call anyone  Disposition: Status is: Inpatient Remains inpatient appropriate because: Continued IV antibiotics for urosepsis Planned Discharge Destination:  TBD, declined SNF last time.  Patrecia Pour, MD 05/28/2022 4:16 PM Page by [email or web address]

## 2022-05-28 NOTE — Progress Notes (Signed)
Initial Nutrition Assessment  DOCUMENTATION CODES:   Non-severe (moderate) malnutrition in context of acute illness/injury  INTERVENTION:  Liberalize diet to regular to encourage PO intake.  Provide Ensure Enlive/Ensure Plus High Protein po TID, each supplement provides 350 kcal and 20 grams of protein.  Provide multivitamin daily.  NUTRITION DIAGNOSIS:   Moderate Malnutrition related to acute illness (PAD with bilateral critical limb ischemia s/p bilateral AKA with prolonged admission) as evidenced by moderate fat depletion, mild muscle depletion.  GOAL:   Patient will meet greater than or equal to 90% of their needs  MONITOR:   PO intake, Supplement acceptance, Weight trends, Skin, I & O's  REASON FOR ASSESSMENT:   Consult  (Nutrition goals)  ASSESSMENT:   51 year old male with PMHx of HTN, PAD, with recent prolonged admission for bilateral critical limb ischemia ultimately undergoing bilateral AKA and was discharge home on 05/19/22 with wound VAC. Now admitted with sepsis due to UTI, acute blood loss anemia, acute encephalopathy.  Underwent right AKA on 04/22/22 and left AKA on 04/30/22.  Met with patient at bedside. He reports his appetite has been variable/decreased for several days now. He eats 2 meals per day. For lunch he may have chicken or McDonald's. For dinner he has a meat with sides (prepared by sister). He reports he has not had anything to eat yet today as he dislikes the food here. He denies nausea, emesis, or abdominal pain. Denies constipation or diarrhea. He reports he is having several formed bowel movements daily. Discussed importance of adequate nutrition. Discussed plan to liberalize diet and encouraged patient to eat well at meals. Patient also amenable to drinking Ensure Enlive/Ensure Plus High Protein to help meet nutritional needs.  Patient denies any recent wt loss. He reports his UBW was 155 lbs (77.5 kg). Suspect recent UBW may have been closer to  65.8-66.1 kg based on recent wts in chart. He reports he was not weighed again after the b/l AKA during recent admission so unsure how much of wt change was related to that. Current wt is 48.4 kg (106.7 lbs). Unable to trend recent wt changes as multiple wts were entered to be exactly 65.8 kg (likely pulled forward instead of a true wt) and wt was not taken after b/l AKA.   Medications reviewed and include: Colace, cefepime, vancomycin.  Labs reviewed: Potassium 3.4  Discussed with RN at bedside who also knew patient from previous admission. She reports his intake was decreased during previous admission.  NUTRITION - FOCUSED PHYSICAL EXAM:  Flowsheet Row Most Recent Value  Orbital Region No depletion  Upper Arm Region Moderate depletion  Thoracic and Lumbar Region Moderate depletion  Buccal Region No depletion  Temple Region No depletion  Clavicle Bone Region Mild depletion  Clavicle and Acromion Bone Region Mild depletion  Scapular Bone Region Moderate depletion  Dorsal Hand No depletion  Patellar Region Unable to assess  Anterior Thigh Region Unable to assess  Posterior Calf Region Unable to assess  Edema (RD Assessment) None  Hair Reviewed  Eyes Reviewed  Mouth Reviewed  Skin Reviewed  Nails Reviewed       Diet Order:   Diet Order             Diet regular Room service appropriate? Yes; Fluid consistency: Thin  Diet effective now                   EDUCATION NEEDS:   No education needs have been identified at this time  Skin:  Skin Assessment: Skin Integrity Issues: Skin Integrity Issues:: Wound VAC, Other (Comment) Wound Vac: right groin Other: non pressure wound to left thigh  Last BM:  05/28/22 large type 6  Height:   Ht Readings from Last 1 Encounters:  05/28/22 5' 5" (1.651 m)    Weight:   Wt Readings from Last 1 Encounters:  05/28/22 48.4 kg    BMI:  Body mass index is 17.76 kg/m.  Estimated Nutritional Needs:   Kcal:   1700-1900  Protein:  85-95 grams  Fluid:  1.7-1.9 L/day  Loanne Drilling, MS, RD, LDN, CNSC Pager number available on Amion

## 2022-05-28 NOTE — Consult Note (Addendum)
[facility] Nurse Consult Note: Patient receiving care in [place] Patient known to [place] team from previous admission. Wound evaluated by Vicente Serene, PA-C with [facility] Home vac placed in bag with patient belongings. Placed name sticker on the outside of the vac case. Will need new canister at discharge.  Reason for Consult: change vac dressing to hospital vac machine.  Wound type: surgical right groin wound Pressure Injury POA: NA Measurement: 8 cm x 5.5 cm x 0.1 cm Wound bed: healthy granulation tissue Drainage (amount, consistency, odor) Serosanguinous in canister Periwound: intact Dressing procedure/placement/frequency: 2 pieces of black foam removed. One piece of black foam replaced. Drape applied. Immediate suction obtained at 75 mmHg (same as previous order).  This vac dressing change is simple and can be changed by bedside nursing.   [place] nurse will not follow but are available to the patient and medical team if needed.  Cathlean Marseilles Tamala Julian, MSN, RN, [place], [place]le Pearl, [facility] Wound Treatment Associate Pager [phone]

## 2022-05-29 DIAGNOSIS — E440 Moderate protein-calorie malnutrition: Secondary | ICD-10-CM | POA: Insufficient documentation

## 2022-05-29 LAB — GLUCOSE, CAPILLARY: Glucose-Capillary: 104 mg/dL — ABNORMAL HIGH (ref 70–99)

## 2022-05-29 LAB — URINE CULTURE: Culture: >=100000 — AB

## 2022-05-29 LAB — CBC
HCT: 36.3 % — ABNORMAL LOW (ref 39.0–52.0)
Hemoglobin: 12.2 g/dL — ABNORMAL LOW (ref 13.0–17.0)
MCH: 28.2 pg (ref 26.0–34.0)
MCHC: 33.6 g/dL (ref 30.0–36.0)
MCV: 84 fL (ref 80.0–100.0)
Platelets: 314 10*3/uL (ref 150–400)
RBC: 4.32 MIL/uL (ref 4.22–5.81)
RDW: 16.6 % — ABNORMAL HIGH (ref 11.5–15.5)
WBC: 11.4 10*3/uL — ABNORMAL HIGH (ref 4.0–10.5)
nRBC: 0 % (ref 0.0–0.2)

## 2022-05-29 LAB — BASIC METABOLIC PANEL
Anion gap: 13 (ref 5–15)
BUN: 11 mg/dL (ref 6–20)
CO2: 20 mmol/L — ABNORMAL LOW (ref 22–32)
Calcium: 9 mg/dL (ref 8.9–10.3)
Chloride: 105 mmol/L (ref 98–111)
Creatinine, Ser: 0.88 mg/dL (ref 0.61–1.24)
GFR, Estimated: >60 mL/min (ref >60)
Glucose, Bld: 113 mg/dL — ABNORMAL HIGH (ref 70–99)
Potassium: 3.6 mmol/L (ref 3.5–5.1)
Sodium: 138 mmol/L (ref 135–145)

## 2022-05-29 MED ORDER — CARVEDILOL 12.5 MG PO TABS
12.5000 mg | ORAL_TABLET | Freq: Two times a day (BID) | ORAL | Status: DC
  Administered 2022-05-29 – 2022-06-04 (×13): 12.5 mg via ORAL
  Filled 2022-05-29 (×13): qty 1

## 2022-05-29 NOTE — Evaluation (Signed)
Occupational Therapy Evaluation Patient Details Name: Jeffrey Fitzgerald MRN: [id number] DOB: [birth year] Today's Date: 05/29/2022   History of Present Illness Jeffrey Fitzgerald is a 51 y.o. male admitted 9/27 who presented to the ED 9/27 feeling poorly with poor per oral intake, urinary symptoms, possible hematuria, and fevers documented by family/HH RN. He was found to be febrile with urinalysis suggestive of UTI.  PMH:  HTN, PAD with recent prolonged admission for BLE critical limb ischemia ultimately undergoing bilateral AKA, discharged home with right groin wound vac 9/20   Clinical Impression   [name] was evaluated s/p the above admission list, of note he was recently discharged from [name] and re-admitted 1 week later. Per pt he completed indep transfers to [facility] and BSC at home and completed ADLs while sitting in the bed. Pt had functional limitations this date due to impaired cognition with flat affect, generalized weakness, poor sitting balance and decreased activity tolerance. He needed mod A for lateral scoot transfer from recliner to bed with very poor technique despite max cues. Due to deficits he also needs min-mod A for all ADLs. He will benefit from OT acutely. Recommend d/c to SNF - of note, also recommend SNF last admission but pt went home with care form sister.     Recommendations for follow up therapy are one component of a multi-disciplinary discharge planning process, led by the attending physician.  Recommendations may be updated based on patient status, additional functional criteria and insurance authorization.   Follow Up Recommendations  Skilled nursing-short term rehab (<3 hours/day)    Assistance Recommended at Discharge Frequent or constant Supervision/Assistance  Patient can return home with the following A lot of help with walking and/or transfers;A lot of help with bathing/dressing/bathroom;Assistance with cooking/housework;Direct supervision/assist for medications  management;Direct supervision/assist for financial management;Assist for transportation;Help with stairs or ramp for entrance    Functional Status Assessment  Patient has had a recent decline in their functional status and demonstrates the ability to make significant improvements in function in a reasonable and predictable amount of time.  Equipment Recommendations  None recommended by OT    Recommendations for Other Services       Precautions / Restrictions Precautions Precautions: Fall;Other (comment) Precaution Comments: R groin wound vac Restrictions Weight Bearing Restrictions: Yes RLE Weight Bearing: Non weight bearing LLE Weight Bearing: Non weight bearing      Mobility Bed Mobility Overal bed mobility: Needs Assistance Bed Mobility: Sit to Supine, Rolling Rolling: Min guard   Supine to sit: Min assist     General bed mobility comments: needed significantly increased time    Transfers Overall transfer level: Needs assistance Equipment used: None Transfers: Bed to chair/wheelchair/BSC            Lateral/Scoot Transfers: Mod assist General transfer comment: gave pt choice of anterior vs lateral transfer. he chose later - demonstrated poor tehcnique and ignored cues, needed mod A for safety      Balance Overall balance assessment: Needs assistance Sitting-balance support: No upper extremity supported, Bilateral upper extremity supported Sitting balance-Leahy Scale: Poor                                     ADL either performed or assessed with clinical judgement   ADL Overall ADL's : Needs assistance/impaired Eating/Feeding: Independent;Sitting   Grooming: Set up;Sitting   Upper Body Bathing: Set up;Sitting   Lower Body Bathing: Minimal assistance;Sitting/lateral leans  Upper Body Dressing : Set up;Sitting   Lower Body Dressing: Minimal assistance;Sitting/lateral leans   Toilet Transfer: Moderate assistance;BSC/3in1 Doctor, general practice Details (indicate cue type and reason): post transfer Toileting- Architect and Hygiene: Min guard;Sitting/lateral lean       Functional mobility during ADLs: Moderate assistance General ADL Comments: limited by bilat AKA, NWB, impaired cognition, difficulty following commands, poor and unsafe tranfer tehcnique     Vision Baseline Vision/History: 0 No visual deficits Vision Assessment?: No apparent visual deficits     Perception     Praxis      Pertinent Vitals/Pain Pain Assessment Pain Assessment: Faces Faces Pain Scale: Hurts a little bit Pain Location: generalized Pain Descriptors / Indicators: Grimacing, Sore Pain Intervention(s): Limited activity within patient's tolerance, Monitored during session     Hand Dominance Right   Extremity/Trunk Assessment Upper Extremity Assessment Upper Extremity Assessment: Generalized weakness   Lower Extremity Assessment Lower Extremity Assessment: Defer to PT evaluation RLE Deficits / Details: s/p right AKA on 8/24; able to move hip in all directions LLE Deficits / Details: Left AKA on 9/2, can move hip all directions   Cervical / Trunk Assessment Cervical / Trunk Assessment: Normal   Communication Communication Communication: No difficulties   Cognition Arousal/Alertness: Awake/alert Behavior During Therapy: Flat affect Overall Cognitive Status: No family/caregiver present to determine baseline cognitive functioning Area of Impairment: Attention, Following commands, Safety/judgement, Awareness, Problem solving, Memory                   Current Attention Level: Sustained Memory: Decreased short-term memory Following Commands: Follows one step commands consistently, Follows one step commands with increased time Safety/Judgement: Decreased awareness of safety, Decreased awareness of deficits Awareness: Emergent Problem Solving: Requires verbal cues, Slow processing, Difficulty sequencing General  Comments: extremely flat affect. pt requires increased time to follow commands, poor problem sovling and awareness to self care needs. Ignored cues for proper transfer tehcnique     General Comments  VSS on RA, NT present    Exercises     Shoulder Instructions      Home Living Family/patient expects to be discharged to:: Private residence Living Arrangements: Other relatives Available Help at Discharge: Family;Available 24 hours/day Type of Home: House Home Access: Level entry     Home Layout: One level     Bathroom Shower/Tub: Chief Strategy Officer: Standard Bathroom Accessibility: Yes   Home Equipment: Cane - single point;Electric scooter;Hospital bed;Wheelchair - manual;BSC/3in1          Prior Functioning/Environment Prior Level of Function : Independent/Modified Independent             Mobility Comments: uses wheelchair ADLs Comments: sponge bathes        OT Problem List: Decreased strength;Decreased range of motion;Decreased activity tolerance;Impaired balance (sitting and/or standing);Decreased cognition;Decreased safety awareness;Decreased knowledge of use of DME or AE;Decreased knowledge of precautions      OT Treatment/Interventions: Self-care/ADL training;DME and/or AE instruction;Therapeutic activities;Patient/family education;Balance training    OT Goals(Current goals can be found in the care plan section) Acute Rehab OT Goals Patient Stated Goal: did not state OT Goal Formulation: With patient Time For Goal Achievement: 06/12/22 Potential to Achieve Goals: Fair ADL Goals Pt Will Perform Grooming: Independently;sitting Pt Will Perform Lower Body Dressing: with supervision;sitting/lateral leans Pt Will Transfer to Toilet: with min guard assist;bedside commode;anterior/posterior transfer Pt Will Perform Toileting - Clothing Manipulation and hygiene: with supervision;sitting/lateral leans Additional ADL Goal #1: Pt will complete bed  mobility with  supervision A as a precursor to sitting EOB ADLs  OT Frequency: Min 2X/week       AM-PAC OT "6 Clicks" Daily Activity     Outcome Measure Help from another person eating meals?: None Help from another person taking care of personal grooming?: A Little Help from another person toileting, which includes using toliet, bedpan, or urinal?: A Lot Help from another person bathing (including washing, rinsing, drying)?: A Little Help from another person to put on and taking off regular upper body clothing?: A Little Help from another person to put on and taking off regular lower body clothing?: A Little 6 Click Score: 18   End of Session Nurse Communication: Mobility status  Activity Tolerance: Patient tolerated treatment well Patient left: in bed;with call bell/phone within reach;with bed alarm set;with nursing/sitter in room  OT Visit Diagnosis: Other abnormalities of gait and mobility (R26.89);Muscle weakness (generalized) (M62.81)                Time: 0300-9233 OT Time Calculation (min): 14 min Charges:  OT General Charges $OT Visit: 1 Visit OT Evaluation $OT Eval Moderate Complexity: 1 Mod    Malayasia Mirkin D Causey 05/29/2022, 1:21 PM

## 2022-05-29 NOTE — Evaluation (Signed)
Physical Therapy Evaluation Patient Details Name: Jeffrey Fitzgerald MRN: [id number] DOB: 1970/09/20 Today's Date: 05/29/2022  History of Present Illness  [name] is a 51 y.o. male admitted 9/27 who presented to the ED 9/27 feeling poorly with poor per oral intake, urinary symptoms, possible hematuria, and fevers documented by family/HH RN. He was found to be febrile with urinalysis suggestive of UTI.  PMH:  HTN, PAD with recent prolonged admission for BLE critical limb ischemia ultimately undergoing bilateral AKA, discharged home with right groin wound vac 9/20  Clinical Impression  Pt admitted with above diagnosis. Pt was able to perform posterior transfer to the chair with min assist of 2 with good UE strength for lifting self in chair. Pt was unaware that he was wet with urine and had not called nurse.  Assisted pt with cleaning and called nurse to address primo fit.  Pt may lack some safety awareness and did need incr cues for transfer as he does seem to need repetition to follow commands. Note in chart seemed to suggest that sister is having difficulty caring for pt at home and desires SNF for pt.  Agree that he must have 24 hour care and if sister cant provide that, will need SNF.   Pt currently with functional limitations due to the deficits listed below (see PT Problem List). Pt will benefit from skilled PT to increase their independence and safety with mobility to allow discharge to the venue listed below.          Recommendations for follow up therapy are one component of a multi-disciplinary discharge planning process, led by the attending physician.  Recommendations may be updated based on patient status, additional functional criteria and insurance authorization.  Follow Up Recommendations Skilled nursing-short term rehab (<3 hours/day) Can patient physically be transported by private vehicle: No    Assistance Recommended at Discharge Intermittent Supervision/Assistance   Patient can return home with the following  Assistance with cooking/housework;Direct supervision/assist for financial management;Assist for transportation;Help with stairs or ramp for entrance;A little help with walking and/or transfers;A little help with bathing/dressing/bathroom;Direct supervision/assist for medications management    Equipment Recommendations None recommended by PT  Recommendations for Other Services       Functional Status Assessment Patient has had a recent decline in their functional status and demonstrates the ability to make significant improvements in function in a reasonable and predictable amount of time.     Precautions / Restrictions Precautions Precautions: Fall;Other (comment) Precaution Comments: R groin wound vac Restrictions Weight Bearing Restrictions: Yes RLE Weight Bearing: Non weight bearing LLE Weight Bearing: Non weight bearing      Mobility  Bed Mobility Overal bed mobility: Needs Assistance Bed Mobility: Supine to Sit, Rolling Rolling: Min guard   Supine to sit: Min assist     General bed mobility comments: min guard to fully roll in bed min assist to come to long sitting from [facility] at 20 degrees. Once pt got balance can long sit in bed without assist.    Transfers Overall transfer level: Needs assistance Equipment used: None Transfers: Bed to chair/wheelchair/BSC         Anterior-Posterior transfers: Min assist, Min guard (posterior transfer into the recliner)   General transfer comment: Mostly min guard  assist to scoot posteriorly into recliner chair with pt able to lift hips and scoot sideways in bed and back up to recliner. Pt able to reach back for armrests of recliner and lift buttocks to get to chair. Pt needed a  little assist to get buttocks scooted all the way back in chair. Notified nursing that pt needed new primo fit and nurse came in to address this.    Ambulation/Gait               General Gait Details:  unable  Stairs            Wheelchair Mobility    Modified Rankin (Stroke Patients Only)       Balance Overall balance assessment: Needs assistance Sitting-balance support: No upper extremity supported, Bilateral upper extremity supported Sitting balance-Leahy Scale: Poor Sitting balance - Comments: statically upright in bed without back support min guard relies on UE support                                     Pertinent Vitals/Pain Pain Assessment Pain Assessment: Faces Faces Pain Scale: Hurts a little bit Pain Location: generalized Pain Descriptors / Indicators: Grimacing, Sore Pain Intervention(s): Limited activity within patient's tolerance, Monitored during session, Repositioned    Home Living Family/patient expects to be discharged to:: Private residence Living Arrangements: Other relatives (sister) Available Help at Discharge: Family;Available 24 hours/day Type of Home: House Home Access: Level entry       Home Layout: One level Home Equipment: Cane - single point;Electric scooter;Hospital bed;Wheelchair - manual;BSC/3in1      Prior Function Prior Level of Function : Independent/Modified Independent             Mobility Comments: uses wheelchair ADLs Comments: sponge bathes     Hand Dominance   Dominant Hand: Right    Extremity/Trunk Assessment   Upper Extremity Assessment Upper Extremity Assessment: Defer to OT evaluation    Lower Extremity Assessment RLE Deficits / Details: s/p right AKA on 8/24; able to move hip in all directions LLE Deficits / Details: Left AKA on 9/2, can move hip all directions    Cervical / Trunk Assessment Cervical / Trunk Assessment: Normal  Communication   Communication: No difficulties  Cognition Arousal/Alertness: Awake/alert Behavior During Therapy: Flat affect Overall Cognitive Status: No family/caregiver present to determine baseline cognitive functioning Area of Impairment: Attention,  Following commands, Safety/judgement, Awareness, Problem solving, Memory                   Current Attention Level: Sustained Memory: Decreased short-term memory Following Commands: Follows one step commands consistently, Follows one step commands with increased time Safety/Judgement: Decreased awareness of safety, Decreased awareness of deficits Awareness: Emergent Problem Solving: Requires verbal cues, Slow processing, Difficulty sequencing General Comments: pt requires increased time to follow commands, poor problem sovling and awareness to self care needs. Pt was wet with urine as primo fit had leaked however had not called for clean up. decreased attention and recall to complete tasks during session.        General Comments      Exercises Amputee Exercises Hip Flexion/Marching: AROM, Both, 10 reps Chair Push Up: AAROM, 5 reps, Seated   Assessment/Plan    PT Assessment Patient needs continued PT services  PT Problem List Decreased strength;Decreased mobility;Decreased safety awareness;Decreased range of motion;Decreased coordination;Decreased activity tolerance;Decreased balance;Decreased knowledge of use of DME;Pain;Decreased skin integrity       PT Treatment Interventions DME instruction;Functional mobility training;Therapeutic activities;Therapeutic exercise;Balance training;Neuromuscular re-education;Cognitive remediation;Patient/family education;Wheelchair mobility training    PT Goals (Current goals can be found in the Care Plan section)  Acute Rehab PT Goals Patient Stated  Goal: to go home PT Goal Formulation: With patient Time For Goal Achievement: 06/12/22 Potential to Achieve Goals: Fair    Frequency Min 3X/week     Co-evaluation               AM-PAC PT "6 Clicks" Mobility  Outcome Measure Help needed turning from your back to your side while in a flat bed without using bedrails?: A Little Help needed moving from lying on your back to sitting  on the side of a flat bed without using bedrails?: A Little Help needed moving to and from a bed to a chair (including a wheelchair)?: A Little Help needed standing up from a chair using your arms (e.g., wheelchair or bedside chair)?: Total Help needed to walk in hospital room?: Total Help needed climbing 3-5 steps with a railing? : Total 6 Click Score: 12    End of Session Equipment Utilized During Treatment: Gait belt Activity Tolerance: Patient tolerated treatment well Patient left: with call bell/phone within reach;in chair;with chair alarm set;with nursing/sitter in room Nurse Communication: Mobility status PT Visit Diagnosis: Other abnormalities of gait and mobility (R26.89);Muscle weakness (generalized) (M62.81)    Time: 8309-4076 PT Time Calculation (min) (ACUTE ONLY): 21 min   Charges:   PT Evaluation $PT Eval Moderate Complexity: 1 Mod          Marshal Eskew M,PT Acute Rehab Services [phone]   Bevelyn Buckles 05/29/2022, 12:23 PM

## 2022-05-29 NOTE — Progress Notes (Signed)
Progress Note  Patient: Jeffrey Fitzgerald [id number] DOB: 11/26/1970  DOA: 05/26/2022  DOS: 05/29/2022    Brief hospital course: Jeffrey Fitzgerald is a 51 y.o. male with medical history significant of HTN, PAD with recent prolonged admission for BLE critical limb ischemia ultimately undergoing bilateral AKA, discharged home with wound vac 9/20 who presented to the ED 9/27 feeling poorly with poor per oral intake, urinary symptoms, possible hematuria, and fevers documented by family/HH RN. He was found to be febrile with urinalysis suggestive of UTI. Also changes on CT around the aortic graft for which vascular surgery was consulted, feeling this is expected appearance postoperatively. No graft infection currently noted. Vascular surgery considering right AKA site washout. Hemodynamically patient is stabilizing on IV antibiotics.    Assessment and Plan: Sepsis due to UTI: With leukocytosis, tachycardia, tachypnea, encephalopathy  - Monitor urine culture (E. coli, Klebsiella thus far) and blood cultures (NGTD). DC vancomycin pending susceptibilities. Continue cefepime. WBC coming down slowly. Hemodynamically stabilized.   ABLA: s/p 2u PRBCs on admission.  - Hgb with robust response (?spurious), will monitor closely. Hgb remains >12.  Acute encephalopathy: Pt more withdrawn from baseline per report.  - Delirium precautions  PAD s/p aortobifemoral bypass with B femoral endarterectomy with poor healing and other surgical complications eventually resulting in R AKA and then L AKA. - Blood cultures negative, low suspicion for graft infection at this time. Appreciate vascular surgery following the patient. - Continue wound vac changes MWF per vascular surgery - Fat necrosis suspected at site of right AKA, iodoform placed, staples removed 9/29, VVS following wound daily, mentioned he may require washout this coming week. - Continue ASA, statin  Failure to thrive, debility s/p bilateral AKA: Strongly  suspect he will continue to be high readmission risk if not discharged to SNF. Discharged home previously due to no payor source/medicaid pending.  - PT/OT. TOC is consulted for SNF placement to address barriers.   Left arm weakness: No acute change on neuroimaging.  - PT/OT  HTN:  - Continue norvasc, coreg (increase dose 9/30), hydralazine as prescribed at recent discharge  Tobacco use:  - Cessation counseling, nicotine patch  Adjustment reaction:  - SSRI started 9/28, will monitor for tolerance, efficacy  DNR: POA.   Hypokalemia:  - Supplemented and resolved  AKI: Cr 1.22 >> 0.88. Resolved. - Minimize nephrotoxins.   Moderate protein calorie malnutrition:  - RD consulted, supp protein.  Subjective: Flat affect continues, no pain, only concern is that he wants to go home. No fevers, feels ok.   Objective: Vitals:   05/29/22 0016 05/29/22 0421 05/29/22 0733 05/29/22 1239  BP: (!) 141/79 (!) 152/81 137/85 125/74  Pulse: 100 100 (!) 106 98  Resp: 18 16 14 16   Temp: 98.6 F (37 C) 97.8 F (36.6 C) 98.3 F (36.8 C) 98.3 F (36.8 C)  TempSrc: Oral Oral Oral Oral  SpO2: 99% 99% 99% 92%  Weight:      Height:       Gen: 51 y.o. male in no distress Pulm: Nonlabored breathing room air. Clear. CV: Regular rate and rhythm. No murmur, rub, or gallop. No JVD, no dependent edema. GI: Abdomen soft, non-tender, non-distended, with normoactive bowel sounds.  Ext: Warm dry, left AKA stump site healing well without tenderness or erythema or discharge. Staples have been removed. R AKA stump site dressing is c/d/I changed out today by VVS.  Skin: Right femoral/groin wound vac area c/d/I without erythema, discharge or tenderness. No other/new rashes, lesions or  ulcers on visualized skin. Neuro: Alert and oriented to person place and situation. Follows commands without focal neurological deficits. Psych: Judgement and insight appear marginal. Mood depressed with flattened affect.    Data  Personally reviewed: CBC: Recent Labs  Lab 05/26/22 2345 05/28/22 0817 05/29/22 0133  WBC 13.9* 12.0* 11.4*  NEUTROABS 10.3* 9.3*  --   HGB 7.1* 12.1* 12.2*  HCT 22.5* 35.5* 36.3*  MCV 90.4 84.5 84.0  PLT 299 296 382   Basic Metabolic Panel: Recent Labs  Lab 05/26/22 2345 05/28/22 0817 05/29/22 0133  NA 135 138 138  K 3.5 3.4* 3.6  CL 103 105 105  CO2 23 24 20*  GLUCOSE 101* 109* 113*  BUN 12 13 11   CREATININE 1.22 0.97 0.88  CALCIUM 8.2* 8.8* 9.0   GFR: Estimated Creatinine Clearance: 68.8 mL/min (by C-G formula based on SCr of 0.88 mg/dL). Liver Function Tests: Recent Labs  Lab 05/26/22 2345  AST 19  ALT 22  ALKPHOS 110  BILITOT 0.6  PROT 6.0*  ALBUMIN 2.3*   No results for input(s): "LIPASE", "AMYLASE" in the last 168 hours. No results for input(s): "AMMONIA" in the last 168 hours. Coagulation Profile: Recent Labs  Lab 05/26/22 2345  INR 1.1   Cardiac Enzymes: No results for input(s): "CKTOTAL", "CKMB", "CKMBINDEX", "TROPONINI" in the last 168 hours. BNP (last 3 results) No results for input(s): "PROBNP" in the last 8760 hours. HbA1C: No results for input(s): "HGBA1C" in the last 72 hours. CBG: Recent Labs  Lab 05/28/22 2136 05/29/22 0625  GLUCAP 109* 104*   Lipid Profile: No results for input(s): "CHOL", "HDL", "LDLCALC", "TRIG", "CHOLHDL", "LDLDIRECT" in the last 72 hours. Thyroid Function Tests: No results for input(s): "TSH", "T4TOTAL", "FREET4", "T3FREE", "THYROIDAB" in the last 72 hours. Anemia Panel: No results for input(s): "VITAMINB12", "FOLATE", "FERRITIN", "TIBC", "IRON", "RETICCTPCT" in the last 72 hours. Urine analysis:    Component Value Date/Time   COLORURINE YELLOW 05/27/2022 0120   APPEARANCEUR HAZY (A) 05/27/2022 0120   LABSPEC 1.025 05/27/2022 0120   PHURINE 6.0 05/27/2022 0120   GLUCOSEU NEGATIVE 05/27/2022 0120   HGBUR SMALL (A) 05/27/2022 0120   BILIRUBINUR NEGATIVE 05/27/2022 0120   KETONESUR NEGATIVE 05/27/2022  0120   PROTEINUR NEGATIVE 05/27/2022 0120   NITRITE NEGATIVE 05/27/2022 0120   LEUKOCYTESUR MODERATE (A) 05/27/2022 0120   Recent Results (from the past 240 hour(s))  Blood Culture (routine x 2)     Status: None (Preliminary result)   Collection Time: 05/26/22 11:45 PM   Specimen: BLOOD LEFT FOREARM  Result Value Ref Range Status   Specimen Description BLOOD LEFT FOREARM  Final   Special Requests   Final    BOTTLES DRAWN AEROBIC AND ANAEROBIC Blood Culture adequate volume   Culture   Final    NO GROWTH 2 DAYS Performed at [facility], [place] [address] [place], [place] [address]    Report Status PENDING  Incomplete  Blood Culture (routine x 2)     Status: None (Preliminary result)   Collection Time: 05/26/22 11:45 PM   Specimen: BLOOD  Result Value Ref Range Status   Specimen Description BLOOD RIGHT ANTECUBITAL  Final   Special Requests   Final    BOTTLES DRAWN AEROBIC AND ANAEROBIC Blood Culture results may not be optimal due to an inadequate volume of blood received in culture bottles   Culture   Final    NO GROWTH 2 DAYS Performed at [facility], [place],  [place] [address]    Report Status PENDING  Incomplete  Resp Panel by RT-PCR (Flu A&B, Covid)     Status: None   Collection Time: 05/26/22 11:45 PM   Specimen: Nasal Swab  Result Value Ref Range Status   SARS Coronavirus 2 by RT PCR NEGATIVE NEGATIVE Final    Comment: (NOTE) SARS-CoV-2 target nucleic acids are NOT DETECTED.  The SARS-CoV-2 RNA is generally detectable in upper respiratory specimens during the acute phase of infection. The lowest concentration of SARS-CoV-2 viral copies this assay can detect is 138 copies/mL. A negative result does not preclude SARS-Cov-2 infection and should not be used as the sole basis for treatment or other patient management decisions. A negative result may occur with  improper specimen collection/handling, submission of specimen other than  nasopharyngeal swab, presence of viral mutation(s) within the areas targeted by this assay, and inadequate number of viral copies(<138 copies/mL). A negative result must be combined with clinical observations, patient history, and epidemiological information. The expected result is Negative.  Fact Sheet for Patients:  EntrepreneurPulse.com.au  Fact Sheet for Healthcare Providers:  IncredibleEmployment.be  This test is no t yet approved or cleared by the [place] FDA and  has been authorized for detection and/or diagnosis of SARS-CoV-2 by FDA under an Emergency Use Authorization (EUA). This EUA will remain  in effect (meaning this test can be used) for the duration of the COVID-19 declaration under Section 564(b)(1) of the Act, 21 U.S.C.section 360bbb-3(b)(1), unless the authorization is terminated  or revoked sooner.       Influenza A by PCR NEGATIVE NEGATIVE Final   Influenza B by PCR NEGATIVE NEGATIVE Final    Comment: (NOTE) The Xpert Xpress SARS-CoV-2/FLU/RSV plus assay is intended as an aid in the diagnosis of influenza from Nasopharyngeal swab specimens and should not be used as a sole basis for treatment. Nasal washings and aspirates are unacceptable for Xpert Xpress SARS-CoV-2/FLU/RSV testing.  Fact Sheet for Patients: EntrepreneurPulse.com.au  Fact Sheet for Healthcare Providers: IncredibleEmployment.be  This test is not yet approved or cleared by the [place] FDA and has been authorized for detection and/or diagnosis of SARS-CoV-2 by FDA under an Emergency Use Authorization (EUA). This EUA will remain in effect (meaning this test can be used) for the duration of the COVID-19 declaration under Section 564(b)(1) of the Act, 21 U.S.C. section 360bbb-3(b)(1), unless the authorization is terminated or revoked.  Performed at [facility], [place] [address], [place], [place] [address]    Urine Culture     Status: Abnormal   Collection Time: 05/27/22  1:20 AM   Specimen: In/Out Cath Urine  Result Value Ref Range Status   Specimen Description IN/OUT CATH URINE  Final   Special Requests   Final    NONE Performed at [facility], [place] [address], [place], [place] [address]    Culture (A)  Final    >=100,000 COLONIES/mL ESCHERICHIA COLI >=100,000 COLONIES/mL KLEBSIELLA OXYTOCA    Report Status 05/29/2022 FINAL  Final   Organism ID, Bacteria ESCHERICHIA COLI (A)  Final   Organism ID, Bacteria KLEBSIELLA OXYTOCA (A)  Final      Susceptibility   Escherichia coli - MIC*    AMPICILLIN <=2 SENSITIVE Sensitive     CEFAZOLIN <=4 SENSITIVE Sensitive     CEFEPIME <=0.12 SENSITIVE Sensitive     CEFTRIAXONE <=0.25 SENSITIVE Sensitive     CIPROFLOXACIN <=0.25 SENSITIVE Sensitive     GENTAMICIN <=1 SENSITIVE Sensitive  IMIPENEM <=0.25 SENSITIVE Sensitive     NITROFURANTOIN <=16 SENSITIVE Sensitive     TRIMETH/SULFA <=20 SENSITIVE Sensitive     AMPICILLIN/SULBACTAM <=2 SENSITIVE Sensitive     PIP/TAZO <=4 SENSITIVE Sensitive     * >=100,000 COLONIES/mL ESCHERICHIA COLI   Klebsiella oxytoca - MIC*    AMPICILLIN >=32 RESISTANT Resistant     CEFAZOLIN 8 SENSITIVE Sensitive     CEFEPIME <=0.12 SENSITIVE Sensitive     CEFTRIAXONE <=0.25 SENSITIVE Sensitive     CIPROFLOXACIN <=0.25 SENSITIVE Sensitive     GENTAMICIN <=1 SENSITIVE Sensitive     IMIPENEM <=0.25 SENSITIVE Sensitive     NITROFURANTOIN 32 SENSITIVE Sensitive     TRIMETH/SULFA <=20 SENSITIVE Sensitive     AMPICILLIN/SULBACTAM 16 INTERMEDIATE Intermediate     PIP/TAZO <=4 SENSITIVE Sensitive     * >=100,000 COLONIES/mL KLEBSIELLA OXYTOCA     No results found.  Family Communication: None at bedside, pt told me not to call anyone  Disposition: Status is: Inpatient Remains inpatient appropriate because: Continued IV antibiotics for urosepsis, may need washout R AKA wound Planned Discharge Destination: SNF  recommended. TOC consulted.  Patrecia Pour, MD 05/29/2022 1:38 PM Page by [email or web address]

## 2022-05-29 NOTE — Progress Notes (Addendum)
  Progress Note    05/29/2022 8:18 AM * No surgery found *  Subjective:  no complaints   Vitals:   05/29/22 0421 05/29/22 0733  BP: (!) 152/81 137/85  Pulse: 100 (!) 106  Resp: 16 14  Temp: 97.8 F (36.6 C) 98.3 F (36.8 C)  SpO2: 99% 99%   Physical Exam: Lungs:  non labored Incisions:  R groin vac with good seal; R AKA with thick yellowish drainage; L AKA well healed Neurologic: A&O  CBC    Component Value Date/Time   WBC 11.4 (H) 05/29/2022 0133   RBC 4.32 05/29/2022 0133   HGB 12.2 (L) 05/29/2022 0133   HCT 36.3 (L) 05/29/2022 0133   PLT 314 05/29/2022 0133   MCV 84.0 05/29/2022 0133   MCH 28.2 05/29/2022 0133   MCHC 33.6 05/29/2022 0133   RDW 16.6 (H) 05/29/2022 0133   LYMPHSABS 1.9 05/28/2022 0817   MONOABS 0.8 05/28/2022 0817   EOSABS 0.0 05/28/2022 0817   BASOSABS 0.0 05/28/2022 0817    BMET    Component Value Date/Time   NA 138 05/29/2022 0133   K 3.6 05/29/2022 0133   CL 105 05/29/2022 0133   CO2 20 (L) 05/29/2022 0133   GLUCOSE 113 (H) 05/29/2022 0133   BUN 11 05/29/2022 0133   CREATININE 0.88 05/29/2022 0133   CALCIUM 9.0 05/29/2022 0133   GFRNONAA >60 05/29/2022 0133   GFRAA >60 01/12/2020 1421    INR    Component Value Date/Time   INR 1.1 05/26/2022 2345     Intake/Output Summary (Last 24 hours) at 05/29/2022 0818 Last data filed at 05/29/2022 0421 Gross per 24 hour  Intake 100 ml  Output 1100 ml  Net -1000 ml     Assessment/Plan:  51 y.o. male is s/p B AKA re-admitted with urosepsis  R groin vac with good seal; next change with WOC RN on Monday R AKA with thick yellowish drainage; re-packed 2 open areas with wet 2x2 If drainage does not improve or worsens, he will need return to OR for I&D on Monday Dressing should be chnaged twice daily VVS will perform morning dressing change   Dagoberto Ligas, PA-C [facility] [phone] 05/29/2022 8:18 AM  I have interviewed the patient and examined the patient.  I agree with the findings by the PA.  The wounds on his right AKA did have a moderate amount of drainage.  There was no iodoform available so we packed it with 2 x 2's with saline.  I will reevaluate tomorrow.  He may potentially require a washout on Monday.  Gae Gallop, MD

## 2022-05-30 DIAGNOSIS — E440 Moderate protein-calorie malnutrition: Secondary | ICD-10-CM

## 2022-05-30 NOTE — Progress Notes (Signed)
Progress Note  Patient: Jeffrey Fitzgerald DTO:[id number] DOB: 01-20-1971  DOA: 05/26/2022  DOS: 05/30/2022    Brief hospital course: Jeffrey Fitzgerald is a 51 y.o. male with medical history significant of HTN, PAD with recent prolonged admission for BLE critical limb ischemia ultimately undergoing bilateral AKA, discharged home with wound vac 9/20 who presented to the ED 9/27 feeling poorly with poor per oral intake, urinary symptoms, possible hematuria, and fevers documented by family/HH RN. He was found to be febrile with urinalysis suggestive of UTI. Also changes on CT around the aortic graft for which vascular surgery was consulted, feeling this is expected appearance postoperatively. No graft infection currently noted. Vascular surgery considering right AKA site washout. Hemodynamically patient is stabilizing on IV antibiotics.    Assessment and Plan: Sepsis due to UTI: With leukocytosis, tachycardia, tachypnea, encephalopathy  - Monitor urine culture (E. coli, Klebsiella thus far) and blood cultures (NGTD). Continue cefepime for now, narrow postoperatively based on findings. WBC coming down slowly. Hemodynamically stabilized.   ABLA: s/p 2u PRBCs on admission.  - Hgb with robust response (?spurious), will monitor closely. Hgb remains >12. Recheck in AM.  Acute encephalopathy: Pt more withdrawn from baseline per report.  - Delirium precautions  PAD s/p aortobifemoral bypass with B femoral endarterectomy with poor healing and other surgical complications eventually resulting in R AKA and then L AKA. - Blood cultures negative, low suspicion for graft infection at this time. Appreciate vascular surgery following the patient. - Continue wound vac changes MWF per vascular surgery - Fat necrosis suspected at site of right AKA, iodoform placed, staples removed 9/29, VVS following wound daily, mentioned he may require washout 10/2, made NPO p MN. - Continue ASA, statin  Failure to thrive, debility  s/p bilateral AKA: Strongly suspect he will continue to be high readmission risk if not discharged to SNF. Discharged home previously due to no payor source/medicaid pending.  - PT/OT. TOC is consulted for SNF placement to address barriers.   Left arm weakness: No acute change on neuroimaging.  - PT/OT  HTN:  - Continue norvasc, coreg (increase dose 9/30), hydralazine as prescribed at recent discharge  Tobacco use:  - Cessation counseling, nicotine patch  Adjustment reaction:  - SSRI started 9/28, will monitor for tolerance, efficacy  DNR: POA.   Hypokalemia:  - Supplemented and resolved, recheck preoperatively 10/2.  AKI: Cr 1.22 >> 0.88. Resolved. - Minimize nephrotoxins.   Moderate protein calorie malnutrition:  - RD consulted, supp protein.  Subjective: Pain is controlled, does not volunteer any verbal input, denies pain or other concerns.  Objective: Vitals:   05/29/22 2357 05/30/22 0017 05/30/22 0400 05/30/22 0737  BP:  (!) 147/87 (!) 153/82   Pulse:  90 99   Resp: 16 12 14    Temp:  98.5 F (36.9 C) 98.1 F (36.7 C) 98.4 F (36.9 C)  TempSrc: Oral Oral Oral Oral  SpO2:  100% 100%   Weight:      Height:       Gen: 51 y.o. male in no distress Pulm: Nonlabored breathing room air. Clear. CV: Regular rate and rhythm. No murmur, rub, or gallop. No JVD, no dependent edema. GI: Abdomen soft, non-tender, non-distended, with normoactive bowel sounds.  Ext: Warm, no deformities Skin: No new rashes, lesions or ulcers on visualized skin. R femoral wound vac c/d/I, right stump site wrapped, c/d/I, left AKA site healing without discharge, erythema or tenderness Neuro: Alert and oriented. No focal neurological deficits. Psych: Judgement and insight appear fair  though speech is sparse and reserved mood, affect congruent. Calm.    Data Personally reviewed: CBC: Recent Labs  Lab 05/26/22 2345 05/28/22 0817 05/29/22 0133  WBC 13.9* 12.0* 11.4*  NEUTROABS 10.3* 9.3*  --    HGB 7.1* 12.1* 12.2*  HCT 22.5* 35.5* 36.3*  MCV 90.4 84.5 84.0  PLT 299 296 314   Basic Metabolic Panel: Recent Labs  Lab 05/26/22 2345 05/28/22 0817 05/29/22 0133  NA 135 138 138  K 3.5 3.4* 3.6  CL 103 105 105  CO2 23 24 20*  GLUCOSE 101* 109* 113*  BUN 12 13 11   CREATININE 1.22 0.97 0.88  CALCIUM 8.2* 8.8* 9.0   GFR: Estimated Creatinine Clearance: 68.8 mL/min (by C-G formula based on SCr of 0.88 mg/dL). Liver Function Tests: Recent Labs  Lab 05/26/22 2345  AST 19  ALT 22  ALKPHOS 110  BILITOT 0.6  PROT 6.0*  ALBUMIN 2.3*   No results for input(s): "LIPASE", "AMYLASE" in the last 168 hours. No results for input(s): "AMMONIA" in the last 168 hours. Coagulation Profile: Recent Labs  Lab 05/26/22 2345  INR 1.1   Cardiac Enzymes: No results for input(s): "CKTOTAL", "CKMB", "CKMBINDEX", "TROPONINI" in the last 168 hours. BNP (last 3 results) No results for input(s): "PROBNP" in the last 8760 hours. HbA1C: No results for input(s): "HGBA1C" in the last 72 hours. CBG: Recent Labs  Lab 05/28/22 2136 05/29/22 0625  GLUCAP 109* 104*   Lipid Profile: No results for input(s): "CHOL", "HDL", "LDLCALC", "TRIG", "CHOLHDL", "LDLDIRECT" in the last 72 hours. Thyroid Function Tests: No results for input(s): "TSH", "T4TOTAL", "FREET4", "T3FREE", "THYROIDAB" in the last 72 hours. Anemia Panel: No results for input(s): "VITAMINB12", "FOLATE", "FERRITIN", "TIBC", "IRON", "RETICCTPCT" in the last 72 hours. Urine analysis:    Component Value Date/Time   COLORURINE YELLOW 05/27/2022 0120   APPEARANCEUR HAZY (A) 05/27/2022 0120   LABSPEC 1.025 05/27/2022 0120   PHURINE 6.0 05/27/2022 0120   GLUCOSEU NEGATIVE 05/27/2022 0120   HGBUR SMALL (A) 05/27/2022 0120   BILIRUBINUR NEGATIVE 05/27/2022 0120   KETONESUR NEGATIVE 05/27/2022 0120   PROTEINUR NEGATIVE 05/27/2022 0120   NITRITE NEGATIVE 05/27/2022 0120   LEUKOCYTESUR MODERATE (A) 05/27/2022 0120   Recent Results  (from the past 240 hour(s))  Blood Culture (routine x 2)     Status: None (Preliminary result)   Collection Time: 05/26/22 11:45 PM   Specimen: BLOOD LEFT FOREARM  Result Value Ref Range Status   Specimen Description BLOOD LEFT FOREARM  Final   Special Requests   Final    BOTTLES DRAWN AEROBIC AND ANAEROBIC Blood Culture adequate volume   Culture   Final    NO GROWTH 2 DAYS Performed at [facility], [address], [place], [place]    Report Status PENDING  Incomplete  Blood Culture (routine x 2)     Status: None (Preliminary result)   Collection Time: 05/26/22 11:45 PM   Specimen: BLOOD  Result Value Ref Range Status   Specimen Description BLOOD RIGHT ANTECUBITAL  Final   Special Requests   Final    BOTTLES DRAWN AEROBIC AND ANAEROBIC Blood Culture results may not be optimal due to an inadequate volume of blood received in culture bottles   Culture   Final    NO GROWTH 2 DAYS Performed at [facility], [address], [place], [place]    Report Status PENDING  Incomplete  Resp Panel by RT-PCR (Flu A&B, Covid)  Status: None   Collection Time: 05/26/22 11:45 PM   Specimen: Nasal Swab  Result Value Ref Range Status   SARS Coronavirus 2 by RT PCR NEGATIVE NEGATIVE Final    Comment: (NOTE) SARS-CoV-2 target nucleic acids are NOT DETECTED.  The SARS-CoV-2 RNA is generally detectable in upper respiratory specimens during the acute phase of infection. The lowest concentration of SARS-CoV-2 viral copies this assay can detect is 138 copies/mL. A negative result does not preclude SARS-Cov-2 infection and should not be used as the sole basis for treatment or other patient management decisions. A negative result may occur with  improper specimen collection/handling, submission of specimen other than nasopharyngeal swab, presence of viral mutation(s) within the areas targeted by this assay, and inadequate number of viral copies(<138 copies/mL). A  negative result must be combined with clinical observations, patient history, and epidemiological information. The expected result is Negative.  Fact Sheet for Patients:  EntrepreneurPulse.com.au  Fact Sheet for Healthcare Providers:  IncredibleEmployment.be  This test is no t yet approved or cleared by the [place] FDA and  has been authorized for detection and/or diagnosis of SARS-CoV-2 by FDA under an Emergency Use Authorization (EUA). This EUA will remain  in effect (meaning this test can be used) for the duration of the COVID-19 declaration under Section 564(b)(1) of the Act, 21 U.S.C.section 360bbb-3(b)(1), unless the authorization is terminated  or revoked sooner.       Influenza A by PCR NEGATIVE NEGATIVE Final   Influenza B by PCR NEGATIVE NEGATIVE Final    Comment: (NOTE) The Xpert Xpress SARS-CoV-2/FLU/RSV plus assay is intended as an aid in the diagnosis of influenza from Nasopharyngeal swab specimens and should not be used as a sole basis for treatment. Nasal washings and aspirates are unacceptable for Xpert Xpress SARS-CoV-2/FLU/RSV testing.  Fact Sheet for Patients: EntrepreneurPulse.com.au  Fact Sheet for Healthcare Providers: IncredibleEmployment.be  This test is not yet approved or cleared by the [place] FDA and has been authorized for detection and/or diagnosis of SARS-CoV-2 by FDA under an Emergency Use Authorization (EUA). This EUA will remain in effect (meaning this test can be used) for the duration of the COVID-19 declaration under Section 564(b)(1) of the Act, 21 U.S.C. section 360bbb-3(b)(1), unless the authorization is terminated or revoked.  Performed at [facility], [place] [address], [place], [place] [address]   Urine Culture     Status: Abnormal   Collection Time: 05/27/22  1:20 AM   Specimen: In/Out Cath Urine  Result Value Ref Range Status   Specimen  Description IN/OUT CATH URINE  Final   Special Requests   Final    NONE Performed at [facility], [place] [address] [place], [place] [address]    Culture (A)  Final    >=100,000 COLONIES/mL ESCHERICHIA COLI >=100,000 COLONIES/mL KLEBSIELLA OXYTOCA    Report Status 05/29/2022 FINAL  Final   Organism ID, Bacteria ESCHERICHIA COLI (A)  Final   Organism ID, Bacteria KLEBSIELLA OXYTOCA (A)  Final      Susceptibility   Escherichia coli - MIC*    AMPICILLIN <=2 SENSITIVE Sensitive     CEFAZOLIN <=4 SENSITIVE Sensitive     CEFEPIME <=0.12 SENSITIVE Sensitive     CEFTRIAXONE <=0.25 SENSITIVE Sensitive     CIPROFLOXACIN <=0.25 SENSITIVE Sensitive     GENTAMICIN <=1 SENSITIVE Sensitive     IMIPENEM <=0.25 SENSITIVE Sensitive     NITROFURANTOIN <=16 SENSITIVE Sensitive     TRIMETH/SULFA <=20 SENSITIVE Sensitive  AMPICILLIN/SULBACTAM <=2 SENSITIVE Sensitive     PIP/TAZO <=4 SENSITIVE Sensitive     * >=100,000 COLONIES/mL ESCHERICHIA COLI   Klebsiella oxytoca - MIC*    AMPICILLIN >=32 RESISTANT Resistant     CEFAZOLIN 8 SENSITIVE Sensitive     CEFEPIME <=0.12 SENSITIVE Sensitive     CEFTRIAXONE <=0.25 SENSITIVE Sensitive     CIPROFLOXACIN <=0.25 SENSITIVE Sensitive     GENTAMICIN <=1 SENSITIVE Sensitive     IMIPENEM <=0.25 SENSITIVE Sensitive     NITROFURANTOIN 32 SENSITIVE Sensitive     TRIMETH/SULFA <=20 SENSITIVE Sensitive     AMPICILLIN/SULBACTAM 16 INTERMEDIATE Intermediate     PIP/TAZO <=4 SENSITIVE Sensitive     * >=100,000 COLONIES/mL KLEBSIELLA OXYTOCA     No results found.  Family Communication: None at bedside, pt told me not to call anyone  Disposition: Status is: Inpatient Remains inpatient appropriate because: Continued IV antibiotics for urosepsis, may need washout R AKA wound. NPO p MN. Planned Discharge Destination: SNF recommended. TOC consulted.  Tyrone Nine, MD 05/30/2022 9:19 AM Page by [email or web address]

## 2022-05-30 NOTE — TOC Initial Note (Signed)
Transition of Care [facility]) - Initial/Assessment Note    Patient Details  Name: Jeffrey Fitzgerald MRN: [id number] Date of Birth: [birth year]  Transition of Care [facility]) CM/SW Contact:    Coralee Pesa, [place] Phone Number: 05/30/2022, 11:57 AM  Clinical Narrative:                  CSW left VM for pt's sister, as CSW was unable to speak with pt. Rec is for SNF, barrier is pt is currently uninsured, has been discharged home recently due to this. Pt may be a candidate for an LOG. CSW to fax pt's referral to facilities that may consider for an LOG. TOC will continue to follow for DC needs. Expected Discharge Plan: [place]     Patient Goals and CMS Choice        Expected Discharge Plan and Services Expected Discharge Plan: [place]                                              Prior Living Arrangements/Services                       Activities of Daily Living      Permission Sought/Granted                  Emotional Assessment              Admission diagnosis:  Fever [R50.9] Sepsis secondary to UTI ([place]) [A41.9, N39.0] Fever, unspecified fever cause [W09.8] Periumbilical abdominal tenderness with rebound tenderness [R10.825] Patient Active Problem List   Diagnosis Date Noted   Malnutrition of moderate degree 05/29/2022   Sepsis secondary to UTI ([place]) 05/27/2022   Muscle weakness of left upper extremity 05/27/2022   Tobacco dependence 05/27/2022   Adjustment reaction 05/27/2022   DNR (do not resuscitate) 05/27/2022   Acute postoperative anemia due to expected blood loss    Gastroesophageal reflux disease    SIRS (systemic inflammatory response syndrome) ([place]) 04/27/2022   Hypertensive urgency 04/27/2022   Hypokalemia 04/27/2022   Pressure injury of skin 04/13/2022   Acute renal failure ([place])    Other specified anemias    PAD (peripheral artery disease) ([place]) 04/07/2022   Critical limb ischemia of both lower  extremities ([place]) 04/01/2022   Critical limb ischemia of left lower extremity with ulceration of ankle ([place]) 03/30/2022   Iliac artery occlusion, 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 [place] [address] Phone: [phone] Fax: [phone]     Social Determinants of Health (SDOH) Interventions    Readmission Risk Interventions    05/18/2022    4:16 PM  Readmission Risk Prevention Plan  Post Dischage Appt Complete  Medication Screening Complete  Transportation Screening Complete

## 2022-05-30 NOTE — Progress Notes (Signed)
Pt removed dressing and refused to have L AKA rewrapped.

## 2022-05-30 NOTE — Progress Notes (Addendum)
  Progress Note   05/30/2022 7:45 AM * No surgery found *  Subjective:  no complaints  Vitals:   05/30/22 0400 05/30/22 0737  BP: (!) 153/82   Pulse: 99   Resp: 14   Temp: 98.1 F (36.7 C) 98.4 F (36.9 C)  SpO2: 100%    Physical Exam: Lungs:  non labored Incisions:  R groin with vac with good seal; R AKA dressing changed 2 hrs prior to my exam; no further purulence noted Neurologic: A&O  CBC    Component Value Date/Time   WBC 11.4 (H) 05/29/2022 0133   RBC 4.32 05/29/2022 0133   HGB 12.2 (L) 05/29/2022 0133   HCT 36.3 (L) 05/29/2022 0133   PLT 314 05/29/2022 0133   MCV 84.0 05/29/2022 0133   MCH 28.2 05/29/2022 0133   MCHC 33.6 05/29/2022 0133   RDW 16.6 (H) 05/29/2022 0133   LYMPHSABS 1.9 05/28/2022 0817   MONOABS 0.8 05/28/2022 0817   EOSABS 0.0 05/28/2022 0817   BASOSABS 0.0 05/28/2022 0817    BMET    Component Value Date/Time   NA 138 05/29/2022 0133   K 3.6 05/29/2022 0133   CL 105 05/29/2022 0133   CO2 20 (L) 05/29/2022 0133   GLUCOSE 113 (H) 05/29/2022 0133   BUN 11 05/29/2022 0133   CREATININE 0.88 05/29/2022 0133   CALCIUM 9.0 05/29/2022 0133   GFRNONAA >60 05/29/2022 0133   GFRAA >60 01/12/2020 1421    INR    Component Value Date/Time   INR 1.1 05/26/2022 2345    Intake/Output Summary (Last 24 hours) at 05/30/2022 0746 Last data filed at 05/30/2022 0430 Gross per 24 hour  Intake 243 ml  Output 1100 ml  Net -857 ml   Assessment/Plan:  51 y.o. male is s/p B AKA   R groin vac change with WOC RN tomorrow R AKA dressing changed prior to my exam; continue wet to dry packing with 2x2 BID Patient will be npo after midnight; we will re-evaluate R AKA in the morning to determine if R AKA washout is needed  Dagoberto Ligas, PA-C [facility] [phone] 05/30/2022 7:46 AM  I have interviewed the patient and examined the patient. I agree with the findings by the PA.  The drainage seems to have slowed down and we will  continue dressing changes with gauze instead of iodoform.  We will make him n.p.o. and only consider washout if he has worsening drainage.  However I think there is a good chance this may heal by secondary intention.  Gae Gallop, MD

## 2022-05-30 NOTE — Progress Notes (Signed)
Pharmacy Antibiotic Note  Jeffrey Fitzgerald is a 51 y.o. male admitted on 05/26/2022 with sepsis.  Pharmacy has been consulted for cefepime dosing. PMH of BLE amputations and endarterectomy. WBC 11.4 improving, afebrile, Scr 0.88 at new baseline.   Plan: Continue cefepime q8h  Monitor for signs of clinical improvement, fever curve, WBC, renal function, and cultures  Height: 5\' 5"  (165.1 cm) Weight: 48.4 kg (106 lb 11.2 oz) IBW/kg (Calculated) : 61.5  Temp (24hrs), Avg:98.4 F (36.9 C), Min:97.8 F (36.6 C), Max:98.8 F (37.1 C)  Recent Labs  Lab 05/26/22 2345 05/28/22 0817 05/29/22 0133  WBC 13.9* 12.0* 11.4*  CREATININE 1.22 0.97 0.88  LATICACIDVEN 0.8  --   --      Estimated Creatinine Clearance: 68.8 mL/min (by C-G formula based on SCr of 0.88 mg/dL).    No Known Allergies  Antimicrobials this admission: Cefepime 9/28 >>  Vancomycin 9/28 >> 9/30  Microbiology results: 9/27 BCx: ngtd 9/28 UCx: >100,000 colonies/mL e coli, klebsiella oxytoca  Thank you for allowing pharmacy to be a part of this patient's care.  Eliseo Gum, PharmD PGY1 Pharmacy Resident   05/30/2022  1:45 PM

## 2022-05-30 NOTE — NC FL2 (Signed)
[place] LEVEL OF CARE SCREENING TOOL     IDENTIFICATION  Patient Name: Jeffrey Fitzgerald Birthdate: 18-May-1971 Sex: male Admission Date (Current Location): 05/26/2022  [facility] and [place] Number:  Herbalist and Address:  [facility] [place], [place] [address], [place], [place] [address]      Provider Number: [id number]  Attending Physician Name and Address:  Patrecia Pour, MD  Relative Name and Phone Number:  [name] [phone]    Current Level of Care: Hospital Recommended Level of Care: [place] Prior Approval Number:    Date Approved/Denied:   PASRR Number: [id number]  Discharge Plan: SNF    Current Diagnoses: Patient Active Problem List   Diagnosis Date Noted   Malnutrition of moderate degree 05/29/2022   Sepsis secondary to UTI ([place]) 05/27/2022   Muscle weakness of left upper extremity 05/27/2022   Tobacco dependence 05/27/2022   Adjustment reaction 05/27/2022   DNR (do not resuscitate) 05/27/2022   Acute postoperative anemia due to expected blood loss    Gastroesophageal reflux disease    SIRS (systemic inflammatory response syndrome) ([place]) 04/27/2022   Hypertensive urgency 04/27/2022   Hypokalemia 04/27/2022   Pressure injury of skin 04/13/2022   Acute renal failure [facility])    Other specified anemias    PAD (peripheral artery disease) ([place]) 04/07/2022   Critical limb ischemia of both lower extremities ([place]) 04/01/2022   Critical limb ischemia of left lower extremity with ulceration of ankle ([place]) 03/30/2022   Iliac artery occlusion, 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 MASD scrotum)                       Personal Care Assistance Level of Assistance  Bathing, Feeding, Dressing Bathing Assistance: Maximum assistance Feeding assistance: Limited assistance Dressing Assistance: Maximum assistance     Functional Limitations Info  Sight, Hearing, Speech Sight Info: Adequate Hearing Info: Adequate Speech Info: Adequate    SPECIAL CARE FACTORS FREQUENCY  PT (By licensed PT), OT (By licensed OT)     PT Frequency: 5x week OT Frequency: 5x week            Contractures Contractures Info: Not present    Additional Factors Info  Code Status, Allergies, Psychotropic Code Status Info: DNR Allergies Info: NKA Psychotropic Info: Sertraline         Current Medications (05/30/2022):  This is the current hospital active medication list Current Facility-Administered Medications  Medication Dose Route Frequency Provider Last Rate Last Admin   0.9 %  sodium chloride infusion (Manually program via Guardrails IV Fluids)   Intravenous Once Karmen Bongo, MD   Held at 05/27/22 1905   acetaminophen (TYLENOL) tablet 650 mg  650 mg Oral Q6H PRN Karmen Bongo, MD       Or   acetaminophen (TYLENOL) suppository 650 mg  650 mg Rectal Q6H PRN Karmen Bongo, MD       albuterol (PROVENTIL) (2.5  MG/3ML) 0.083% nebulizer solution 2.5 mg  2.5 mg Nebulization Q2H PRN Karmen Bongo, MD       amLODipine (NORVASC) tablet 10 mg  10 mg Oral Daily Karmen Bongo, MD   10 mg at 05/30/22 0940   aspirin EC tablet 81 mg  81 mg Oral Daily Karmen Bongo, MD   81 mg at 05/30/22 0940   atorvastatin (LIPITOR) tablet 80 mg  80 mg Oral Daily Karmen Bongo, MD   80 mg at 05/30/22 0941   bisacodyl (DULCOLAX) EC tablet 5 mg  5 mg Oral Daily PRN Karmen Bongo, MD       carvedilol  (COREG) tablet 12.5 mg  12.5 mg Oral BID WC Vance Gather B, MD   12.5 mg at 05/30/22 0941   ceFEPIme (MAXIPIME) 2 g in sodium chloride 0.9 % 100 mL IVPB  2 g Intravenous Lynne Logan, MD 200 mL/hr at 05/30/22 0533 2 g at 05/30/22 0533   docusate sodium (COLACE) capsule 100 mg  100 mg Oral BID Karmen Bongo, MD   100 mg at 05/29/22 2150   feeding supplement (ENSURE ENLIVE / ENSURE PLUS) liquid 237 mL  237 mL Oral TID BM Patrecia Pour, MD   237 mL at 05/30/22 0942   hydrALAZINE (APRESOLINE) injection 5 mg  5 mg Intravenous Q4H PRN Karmen Bongo, MD       hydrALAZINE (APRESOLINE) tablet 25 mg  25 mg Oral BID Karmen Bongo, MD   25 mg at 05/30/22 0941   morphine (PF) 2 MG/ML injection 2 mg  2 mg Intravenous Q2H PRN Karmen Bongo, MD       multivitamin with minerals tablet 1 tablet  1 tablet Oral Daily Patrecia Pour, MD   1 tablet at 05/30/22 0940   nicotine (NICODERM CQ - dosed in mg/24 hours) patch 21 mg  21 mg Transdermal Daily Karmen Bongo, MD   21 mg at 05/30/22 0942   ondansetron (ZOFRAN) tablet 4 mg  4 mg Oral Q6H PRN Karmen Bongo, MD       Or   ondansetron [facility]) injection 4 mg  4 mg Intravenous Q6H PRN Karmen Bongo, MD       oxyCODONE (Oxy IR/ROXICODONE) immediate release tablet 5 mg  5 mg Oral Q4H PRN Karmen Bongo, MD       polyethylene glycol (MIRALAX / GLYCOLAX) packet 17 g  17 g Oral Daily PRN Karmen Bongo, MD       sertraline (ZOLOFT) tablet 25 mg  25 mg Oral Daily Karmen Bongo, MD   25 mg at 05/30/22 0941   sodium chloride flush (NS) 0.9 % injection 3 mL  3 mL Intravenous Lillia Mountain, MD   3 mL at 05/30/22 [id number]     Discharge Medications: Please see discharge summary for a list of discharge medications.  Relevant Imaging Results:  Relevant Lab Results:   Additional Information SSN# [id number]  Coralee Pesa, [place]

## 2022-05-31 ENCOUNTER — Inpatient Hospital Stay (HOSPITAL_COMMUNITY): Payer: Medicaid Other | Admitting: Anesthesiology

## 2022-05-31 ENCOUNTER — Encounter (HOSPITAL_COMMUNITY)
Admission: EM | Disposition: A | Discharge status: Home Health | Payer: Self-pay | Source: Home / Self Care | Attending: Family Medicine

## 2022-05-31 ENCOUNTER — Encounter (HOSPITAL_COMMUNITY): Payer: Self-pay | Admitting: Family Medicine

## 2022-05-31 ENCOUNTER — Other Ambulatory Visit: Payer: Self-pay

## 2022-05-31 DIAGNOSIS — D649 Anemia, unspecified: Secondary | ICD-10-CM

## 2022-05-31 DIAGNOSIS — I739 Peripheral vascular disease, unspecified: Secondary | ICD-10-CM

## 2022-05-31 DIAGNOSIS — T8789 Other complications of amputation stump: Secondary | ICD-10-CM

## 2022-05-31 DIAGNOSIS — M9689 Other intraoperative and postprocedural complications and disorders of the musculoskeletal system: Secondary | ICD-10-CM

## 2022-05-31 DIAGNOSIS — I10 Essential (primary) hypertension: Secondary | ICD-10-CM

## 2022-05-31 HISTORY — PX: APPLICATION OF WOUND VAC: SHX5189

## 2022-05-31 HISTORY — PX: INCISION AND DRAINAGE OF WOUND: SHX1803

## 2022-05-31 LAB — BASIC METABOLIC PANEL
Anion gap: 9 (ref 5–15)
BUN: 16 mg/dL (ref 6–20)
CO2: 22 mmol/L (ref 22–32)
Calcium: 8.9 mg/dL (ref 8.9–10.3)
Chloride: 107 mmol/L (ref 98–111)
Creatinine, Ser: 0.99 mg/dL (ref 0.61–1.24)
GFR, Estimated: >60 mL/min (ref >60)
Glucose, Bld: 105 mg/dL — ABNORMAL HIGH (ref 70–99)
Potassium: 3.7 mmol/L (ref 3.5–5.1)
Sodium: 138 mmol/L (ref 135–145)

## 2022-05-31 LAB — CBC
HCT: 35.2 % — ABNORMAL LOW (ref 39.0–52.0)
Hemoglobin: 11.5 g/dL — ABNORMAL LOW (ref 13.0–17.0)
MCH: 28.3 pg (ref 26.0–34.0)
MCHC: 32.7 g/dL (ref 30.0–36.0)
MCV: 86.7 fL (ref 80.0–100.0)
Platelets: 271 10*3/uL (ref 150–400)
RBC: 4.06 MIL/uL — ABNORMAL LOW (ref 4.22–5.81)
RDW: 16.3 % — ABNORMAL HIGH (ref 11.5–15.5)
WBC: 7.8 10*3/uL (ref 4.0–10.5)
nRBC: 0 % (ref 0.0–0.2)

## 2022-05-31 LAB — GLUCOSE, CAPILLARY: Glucose-Capillary: 103 mg/dL — ABNORMAL HIGH (ref 70–99)

## 2022-05-31 SURGERY — APPLICATION, WOUND VAC
Anesthesia: General | Laterality: Right

## 2022-05-31 MED ORDER — PROPOFOL 10 MG/ML IV BOLUS
INTRAVENOUS | Status: DC | PRN
  Administered 2022-05-31: 90 mg via INTRAVENOUS

## 2022-05-31 MED ORDER — LIDOCAINE HCL (CARDIAC) PF 100 MG/5ML IV SOSY
PREFILLED_SYRINGE | INTRAVENOUS | Status: DC | PRN
  Administered 2022-05-31: 40 mg via INTRAVENOUS

## 2022-05-31 MED ORDER — PROPOFOL 10 MG/ML IV BOLUS
INTRAVENOUS | Status: AC
  Filled 2022-05-31: qty 20

## 2022-05-31 MED ORDER — FENTANYL CITRATE (PF) 250 MCG/5ML IJ SOLN
INTRAMUSCULAR | Status: DC | PRN
  Administered 2022-05-31: 50 ug via INTRAVENOUS

## 2022-05-31 MED ORDER — ONDANSETRON HCL 4 MG/2ML IJ SOLN
INTRAMUSCULAR | Status: DC | PRN
  Administered 2022-05-31: 4 mg via INTRAVENOUS

## 2022-05-31 MED ORDER — HYDROMORPHONE HCL 1 MG/ML IJ SOLN: 0.2500 mg | INTRAMUSCULAR | Status: DC | PRN

## 2022-05-31 MED ORDER — CHLORHEXIDINE GLUCONATE 0.12 % MT SOLN: 15.0000 mL | Freq: Once | OROMUCOSAL | Status: DC

## 2022-05-31 MED ORDER — ONDANSETRON HCL 4 MG/2ML IJ SOLN
INTRAMUSCULAR | Status: AC
  Filled 2022-05-31: qty 2

## 2022-05-31 MED ORDER — GLYCOPYRROLATE 0.2 MG/ML IJ SOLN
INTRAMUSCULAR | Status: DC | PRN
  Administered 2022-05-31: .2 mg via INTRAVENOUS

## 2022-05-31 MED ORDER — FENTANYL CITRATE (PF) 250 MCG/5ML IJ SOLN
INTRAMUSCULAR | Status: AC
  Filled 2022-05-31: qty 5

## 2022-05-31 MED ORDER — 0.9 % SODIUM CHLORIDE (POUR BTL) OPTIME
TOPICAL | Status: DC | PRN
  Administered 2022-05-31: 4000 mL

## 2022-05-31 MED ORDER — LACTATED RINGERS IV SOLN: INTRAVENOUS | Status: DC

## 2022-05-31 MED ORDER — GLYCOPYRROLATE PF 0.2 MG/ML IJ SOSY
PREFILLED_SYRINGE | INTRAMUSCULAR | Status: AC
  Filled 2022-05-31: qty 1

## 2022-05-31 MED ORDER — LACTATED RINGERS IV SOLN: INTRAVENOUS | Status: DC | PRN

## 2022-05-31 MED ORDER — CHLORHEXIDINE GLUCONATE 0.12 % MT SOLN
OROMUCOSAL | Status: AC
  Filled 2022-05-31: qty 15

## 2022-05-31 MED ORDER — SODIUM CHLORIDE 0.9 % IV SOLN: INTRAVENOUS | Status: DC | PRN

## 2022-05-31 MED ORDER — MIDAZOLAM HCL 2 MG/2ML IJ SOLN
INTRAMUSCULAR | Status: DC | PRN
  Administered 2022-05-31: 2 mg via INTRAVENOUS

## 2022-05-31 MED ORDER — ORAL CARE MOUTH RINSE: 15.0000 mL | Freq: Once | OROMUCOSAL | Status: DC

## 2022-05-31 MED ORDER — MIDAZOLAM HCL 2 MG/2ML IJ SOLN
INTRAMUSCULAR | Status: AC
  Filled 2022-05-31: qty 2

## 2022-05-31 SURGICAL SUPPLY — 51 items
BAG COUNTER SPONGE SURGICOUNT (BAG) ×1 IMPLANT
BNDG ELASTIC 4X5.8 VLCR STR LF (GAUZE/BANDAGES/DRESSINGS) IMPLANT
BNDG ELASTIC 6X5.8 VLCR STR LF (GAUZE/BANDAGES/DRESSINGS) IMPLANT
BNDG GAUZE DERMACEA FLUFF 4 (GAUZE/BANDAGES/DRESSINGS) IMPLANT
CANISTER SUCT 3000ML PPV (MISCELLANEOUS) ×1 IMPLANT
CANISTER WOUNDNEG PRESSURE 500 (CANNISTER) IMPLANT
CLIP VESOCCLUDE MED 6/CT (CLIP) ×1 IMPLANT
COVER SURGICAL LIGHT HANDLE (MISCELLANEOUS) ×1 IMPLANT
DRAPE HALF SHEET 40X57 (DRAPES) IMPLANT
DRAPE INCISE IOBAN 66X45 STRL (DRAPES) ×1 IMPLANT
DRAPE ORTHO SPLIT 77X108 STRL (DRAPES) ×1
DRAPE SURG ORHT 6 SPLT 77X108 (DRAPES) ×1 IMPLANT
DRAPE U-SHAPE 76X120 STRL (DRAPES) IMPLANT
DRSG VAC ATS LRG SENSATRAC (GAUZE/BANDAGES/DRESSINGS) ×2 IMPLANT
DRSG VAC ATS MED SENSATRAC (GAUZE/BANDAGES/DRESSINGS) IMPLANT
DRSG VAC ATS SM SENSATRAC (GAUZE/BANDAGES/DRESSINGS) IMPLANT
ELECT REM PT RETURN 9FT ADLT (ELECTROSURGICAL) ×1
ELECTRODE REM PT RTRN 9FT ADLT (ELECTROSURGICAL) ×1 IMPLANT
GAUZE SPONGE 4X4 12PLY STRL (GAUZE/BANDAGES/DRESSINGS) ×1 IMPLANT
GAUZE XEROFORM 5X9 LF (GAUZE/BANDAGES/DRESSINGS) IMPLANT
GLOVE BIO SURGEON STRL SZ7.5 (GLOVE) ×1 IMPLANT
GLOVE BIOGEL PI IND STRL 8 (GLOVE) ×1 IMPLANT
GLOVE SRG 8 PF TXTR STRL LF DI (GLOVE) ×1 IMPLANT
GLOVE SURG POLYISO LF SZ8 (GLOVE) IMPLANT
GLOVE SURG UNDER POLY LF SZ8 (GLOVE) ×1
GOWN STRL REUS W/ TWL LRG LVL3 (GOWN DISPOSABLE) ×2 IMPLANT
GOWN STRL REUS W/TWL 2XL LVL3 (GOWN DISPOSABLE) ×2 IMPLANT
GOWN STRL REUS W/TWL LRG LVL3 (GOWN DISPOSABLE) ×2
HANDPIECE INTERPULSE COAX TIP (DISPOSABLE)
IV NS IRRIG 3000ML ARTHROMATIC (IV SOLUTION) ×1 IMPLANT
KIT BASIN OR (CUSTOM PROCEDURE TRAY) ×1 IMPLANT
KIT TURNOVER KIT B (KITS) ×1 IMPLANT
NS IRRIG 1000ML POUR BTL (IV SOLUTION) ×1 IMPLANT
PACK CV ACCESS (CUSTOM PROCEDURE TRAY) IMPLANT
PACK GENERAL/GYN (CUSTOM PROCEDURE TRAY) ×1 IMPLANT
PACK UNIVERSAL I (CUSTOM PROCEDURE TRAY) ×1 IMPLANT
PAD ARMBOARD 7.5X6 YLW CONV (MISCELLANEOUS) ×2 IMPLANT
POWDER MYRIAD MORCELLS 1000MG (Miscellaneous) IMPLANT
PULSAVAC PLUS IRRIG FAN TIP (DISPOSABLE)
SET HNDPC FAN SPRY TIP SCT (DISPOSABLE) IMPLANT
STAPLER VISISTAT 35W (STAPLE) IMPLANT
SUT ETHILON 2 0 PSLX (SUTURE) IMPLANT
SUT ETHILON 3 0 PS 1 (SUTURE) IMPLANT
SUT VIC AB 2-0 CTX 36 (SUTURE) IMPLANT
SUT VIC AB 3-0 SH 27 (SUTURE)
SUT VIC AB 3-0 SH 27X BRD (SUTURE) IMPLANT
SUT VICRYL 4-0 PS2 18IN ABS (SUTURE) IMPLANT
SYR BULB IRRIG 60ML STRL (SYRINGE) IMPLANT
TIP FAN IRRIG PULSAVAC PLUS (DISPOSABLE) IMPLANT
TOWEL GREEN STERILE (TOWEL DISPOSABLE) ×1 IMPLANT
WATER STERILE IRR 1000ML POUR (IV SOLUTION) ×1 IMPLANT

## 2022-05-31 NOTE — Progress Notes (Signed)
PHARMACY - PHYSICIAN COMMUNICATION CRITICAL VALUE ALERT - BLOOD CULTURE IDENTIFICATION (BCID)  Jeffrey Fitzgerald is an 51 y.o. male who presented to [facility] on 05/26/2022 with a chief complaint of sepsis.  Assessment:  1 of 4 bottles blood cultures with GPR.  Name of physician (or Provider) Contacted: Bonner Puna  Current antibiotics: cefepime  Changes to prescribed antibiotics recommended:  None - likely contaminant.  No results found for this or any previous visit.  [name], [name], BCCP Clinical Pharmacist  05/31/2022 4:45 PM   [facility] pharmacy phone numbers are listed on [email or web address]

## 2022-05-31 NOTE — Anesthesia Procedure Notes (Signed)
Procedure Name: LMA Insertion Date/Time: 05/31/2022 10:37 AM  Performed by: Claris Che, CRNAPre-anesthesia Checklist: Patient identified, Emergency Drugs available, Suction available, Patient being monitored and Timeout performed Patient Re-evaluated:Patient Re-evaluated prior to induction Oxygen Delivery Method: Circle system utilized Preoxygenation: Pre-oxygenation with 100% oxygen Induction Type: IV induction Ventilation: Mask ventilation without difficulty LMA: LMA inserted LMA Size: 4.0 Number of attempts: 1 Placement Confirmation: positive ETCO2 and breath sounds checked- equal and bilateral Tube secured with: Tape Dental Injury: Teeth and Oropharynx as per pre-operative assessment

## 2022-05-31 NOTE — Progress Notes (Signed)
PT Cancellation Note  Patient Details Name: Jeffrey Fitzgerald MRN: [id number] DOB: 07/12/1971   Cancelled Treatment:    Reason Eval/Treat Not Completed: Patient declined, no reason specified, pt declining all mobility despite max encouragement. Will check back as schedule allows to continue with PT POC.  Audry Riles. PTA Acute Rehabilitation Services Office: [place] 05/31/2022, 3:57 PM

## 2022-05-31 NOTE — Anesthesia Preprocedure Evaluation (Addendum)
Anesthesia Evaluation  Patient identified by MRN, date of birth, ID band Patient awake    Reviewed: Allergy & Precautions, NPO status , Patient's Chart, lab work & pertinent test results, reviewed documented beta blocker date and time   History of Anesthesia Complications Negative for: history of anesthetic complications  Airway Mallampati: II  TM Distance: >3 FB Neck ROM: Full    Dental  (+) Missing, Chipped,    Pulmonary Current Smoker,    Pulmonary exam normal        Cardiovascular hypertension, Pt. on medications and Pt. on home beta blockers + Peripheral Vascular Disease (s/p aortobifemoral bypass, bilateral AKAs)  Normal cardiovascular exam     Neuro/Psych negative neurological ROS     GI/Hepatic Neg liver ROS, GERD  ,  Endo/Other  negative endocrine ROS  Renal/GU negative Renal ROS  negative genitourinary   Musculoskeletal negative musculoskeletal ROS (+)   Abdominal   Peds  Hematology  (+) Blood dyscrasia (Hgb 11.5), anemia ,   Anesthesia Other Findings Right Groin Infection  Reproductive/Obstetrics negative OB ROS                            Anesthesia Physical Anesthesia Plan  ASA: 3  Anesthesia Plan: General   Post-op Pain Management: Tylenol PO (pre-op)*   Induction: Intravenous  PONV Risk Score and Plan: 2 and Treatment may vary due to age or medical condition, Ondansetron, Dexamethasone and Midazolam  Airway Management Planned: LMA  Additional Equipment: None  Intra-op Plan:   Post-operative Plan: Extubation in OR  Informed Consent: I have reviewed the patients History and Physical, chart, labs and discussed the procedure including the risks, benefits and alternatives for the proposed anesthesia with the patient or authorized representative who has indicated his/her understanding and acceptance.     Dental advisory given  Plan Discussed with:  CRNA  Anesthesia Plan Comments:        Anesthesia Quick Evaluation

## 2022-05-31 NOTE — Anesthesia Postprocedure Evaluation (Signed)
Anesthesia Post Note  Patient: Jeffrey Fitzgerald  Procedure(s) Performed: RIGHT GROIN WOUND VAC CHANGE (Right) IRRIGATION AND DEBRIDEMENT RIGHT ABOVE KNEE AMPUTAION (Right)     Patient location during evaluation: PACU Anesthesia Type: General Level of consciousness: awake and alert Pain management: pain level controlled Vital Signs Assessment: post-procedure vital signs reviewed and stable Respiratory status: spontaneous breathing, nonlabored ventilation and respiratory function stable Cardiovascular status: blood pressure returned to baseline Postop Assessment: no apparent nausea or vomiting Anesthetic complications: no   No notable events documented.  Last Vitals:  Vitals:   05/31/22 1155 05/31/22 1205  BP: (!) 171/92 (!) 166/93  Pulse: 91 95  Resp: 14 14  Temp: 36.6 C 36.7 C  SpO2: 99% 100%    Last Pain:  Vitals:   05/31/22 1205  TempSrc: Oral  PainSc: 0-No pain                 Marthenia Rolling

## 2022-05-31 NOTE — Progress Notes (Signed)
PT Cancellation Note  Patient Details Name: Jeffrey Fitzgerald MRN: [id number] DOB: 06/08/1971   Cancelled Treatment:    Reason Eval/Treat Not Completed: Patient at procedure or test/unavailable (pt off unit for procedure). Will follow-up/check back as schedule allows to continue with PT POC.  Audry Riles. PTA Acute Rehabilitation Services Office: [place] 05/31/2022, 10:08 AM

## 2022-05-31 NOTE — Progress Notes (Addendum)
  Progress Note    05/31/2022 7:54 AM * No surgery found *  Subjective:  no complaints   Vitals:   05/30/22 2058 05/31/22 0736  BP: (!) 136/96 (!) 141/93  Pulse: 95 94  Resp: 18 20  Temp: 98.2 F (36.8 C) 98.5 F (36.9 C)  SpO2: 100% 99%   Physical Exam: Lungs:  non labored Incisions:  R AKA purulent material noted Extremities:  R groin with vac with good seal Neurologic: A&O  CBC    Component Value Date/Time   WBC 7.8 05/31/2022 0159   RBC 4.06 (L) 05/31/2022 0159   HGB 11.5 (L) 05/31/2022 0159   HCT 35.2 (L) 05/31/2022 0159   PLT 271 05/31/2022 0159   MCV 86.7 05/31/2022 0159   MCH 28.3 05/31/2022 0159   MCHC 32.7 05/31/2022 0159   RDW 16.3 (H) 05/31/2022 0159   LYMPHSABS 1.9 05/28/2022 0817   MONOABS 0.8 05/28/2022 0817   EOSABS 0.0 05/28/2022 0817   BASOSABS 0.0 05/28/2022 0817    BMET    Component Value Date/Time   NA 138 05/31/2022 0159   K 3.7 05/31/2022 0159   CL 107 05/31/2022 0159   CO2 22 05/31/2022 0159   GLUCOSE 105 (H) 05/31/2022 0159   BUN 16 05/31/2022 0159   CREATININE 0.99 05/31/2022 0159   CALCIUM 8.9 05/31/2022 0159   GFRNONAA >60 05/31/2022 0159   GFRAA >60 01/12/2020 1421    INR    Component Value Date/Time   INR 1.1 05/26/2022 2345     Intake/Output Summary (Last 24 hours) at 05/31/2022 0754 Last data filed at 05/30/2022 0930 Gross per 24 hour  Intake 150 ml  Output 0 ml  Net 150 ml     Assessment/Plan:  51 y.o. male with non healing R AKA wound  R AKA wound still draining purulent material despite wound care over the weekend Plan is for return to OR today for R AKA I&D with possible vac.  We will also change R groin vac at the same time.  Patient agrees with the above. Keep npo Consent ordered   Dagoberto Ligas, PA-C [facility] [phone] 05/31/2022 7:54 AM  I have seen and evaluated the patient. I agree with the PA note as documented above.  Right AKA continues to have two draining  sinuses despite good wound care.  We will plan to go to the OR today for I&D and possible debridement and connecting these two sinuses for better wound care.  We can also change the right groin wound VAC for the sartorius muscle flap.  I discussed all this with Mr. Helbert.  He is amendable to proceed.  Marty Heck, MD [facility] [place] Office: [phone]

## 2022-05-31 NOTE — Progress Notes (Signed)
SLP Cancellation Note  Patient Details Name: Saylor Sheckler MRN: [id number] DOB: 1971/01/05   Cancelled treatment:       Reason Eval/Treat Not Completed: Patient at procedure or test/unavailable; pt currently in surgery; ST will continue efforts for speech/language/cognitive assessment.   Elvina Sidle, M.S., CCC-SLP 05/31/2022, 9:27 AM

## 2022-05-31 NOTE — Op Note (Signed)
    [name]    MRN: [id number] DOB: [birth year]    DATE OF OPERATION: 05/31/2022  PREOP DIAGNOSIS:    Right above-knee amputation nonhealing wound  POSTOP DIAGNOSIS:    Same  PROCEDURE:    Right above-knee amputation incision, debridement, VAC placement Myriad morcell placement  SURGEON: Broadus John  ASSIST: None  ANESTHESIA: General  EBL: 25 mL  INDICATIONS:    Jeffrey Fitzgerald is a 51 y.o. male with multiple surgeries over the last few months for severe peripheral arterial disease.  This culminated bilateral above-knee amputations.  The right side was found to have a nonhealing ulceration on the medial aspect.  After discussing the risk and benefits of washout, debridement, VAC placement, Jeffrey Fitzgerald elected to proceed  FINDINGS:   5 x 7 cm wound with 6 cm sinus tract  TECHNIQUE:   Patient was brought to the OR laid in supine position.  General anesthesia was induced and the patient was prepped draped in standard fashion.  There were 2 sinus tracts that appear to connect.  The skin bridge between the 2 was severed creating a moderately sized open wound.  This was probed and found to tunnel 16 m.  No bone involvement.  Muscle appeared viable.  There was healthy bleeding.  Cultures were taken.  Nonviable soft tissue and adipose tissue were debrided.  The wound was copiously irrigated with antibiotic laden saline.  Myriad morcell powder was placed in the wound bed to promote granulation.  Next, a black sponge was cut to size and placed in the wound bed.  A VAC was placed to suction with no leak.    Jeffrey Burows, MD [facility] DATE OF DICTATION:   05/31/2022

## 2022-05-31 NOTE — Transfer of Care (Signed)
Immediate Anesthesia Transfer of Care Note  Patient: Jeffrey Fitzgerald  Procedure(s) Performed: RIGHT GROIN WOUND VAC CHANGE (Right) IRRIGATION AND DEBRIDEMENT RIGHT ABOVE KNEE AMPUTAION (Right)  Patient Location: PACU  Anesthesia Type:General  Level of Consciousness: oriented, drowsy, patient cooperative and responds to stimulation  Airway & Oxygen Therapy: Patient Spontanous Breathing and Patient connected to nasal cannula oxygen  Post-op Assessment: Report given to RN, Post -op Vital signs reviewed and stable and Patient moving all extremities X 4  Post vital signs: Reviewed and stable  Last Vitals:  Vitals Value Taken Time  BP 172/98 05/31/22 1125  Temp    Pulse 96 05/31/22 1127  Resp 14 05/31/22 1127  SpO2 100 % 05/31/22 1127  Vitals shown include unvalidated device data.  Last Pain:  Vitals:   05/31/22 0949  TempSrc:   PainSc: 0-No pain         Complications: No notable events documented.

## 2022-05-31 NOTE — TOC Progression Note (Signed)
Transition of Care [facility]) - Progression Note    Patient Details  Name: Jeffrey Fitzgerald MRN: [id number] Date of Birth: Apr 30, 1971  Transition of Care [facility] CM/SW [place], [place] Phone Number: 05/31/2022, 9:24 AM  Clinical Narrative:    Pt's sister returned CSW's phone call, and was advised of SNF recommendation. Barriers were discussed, and sister confirms pt was not able to get placement on last admission. CSW to see if pt qualifies for an LOG. Sister is prepared to take pt home if he does not qualify. She would like to know how much a rehab would cost out of pocket, CSW advised it would likely be very expensive. CSW will update covering CSW. TOC will continue to follow.   Expected Discharge Plan: [place]    Expected Discharge Plan and Services Expected Discharge Plan: [place]                                               Social Determinants of Health (SDOH) Interventions    Readmission Risk Interventions    05/18/2022    4:16 PM  Readmission Risk Prevention Plan  Post Dischage Appt Complete  Medication Screening Complete  Transportation Screening Complete

## 2022-05-31 NOTE — Progress Notes (Signed)
Progress Note  Patient: Jeffrey Fitzgerald ONG:[id number] DOB: 08-29-1971  DOA: 05/26/2022  DOS: 05/31/2022    Brief hospital course: Jaymar Loeber is a 51 y.o. male with medical history significant of HTN, PAD with recent prolonged admission for BLE critical limb ischemia ultimately undergoing bilateral AKA, discharged home with wound vac 9/20 who presented to the ED 9/27 feeling poorly with poor per oral intake, urinary symptoms, possible hematuria, and fevers documented by family/HH RN. He was found to be febrile with urinalysis suggestive of UTI. Also changes on CT around the aortic graft for which vascular surgery was consulted, feeling this is expected appearance postoperatively. No graft infection currently noted. Vascular surgery considering right AKA site washout. Hemodynamically patient is stabilizing on IV antibiotics.   10/2: Right AKA site washout +/- wound vac   Assessment and Plan: Sepsis due to UTI: With leukocytosis, tachycardia, tachypnea, encephalopathy  - Monitor urine culture (E. coli, Klebsiella thus far) and blood cultures (NGTD). Continue cefepime for now, narrow postoperatively based on findings. WBC now normal, day 6 abx. Will tailor abx pending vascular surgery.   ABLA: s/p 2u PRBCs on admission.  - Hgb stable  Acute encephalopathy: Pt more withdrawn from baseline per report.  - Delirium precautions  PAD s/p aortobifemoral bypass with B femoral endarterectomy with poor healing and other surgical complications eventually resulting in R AKA and then L AKA. - Blood cultures negative, low suspicion for graft infection at this time. Appreciate vascular surgery following the patient. - Continue wound vac changes MWF per vascular surgery - Fat necrosis suspected at site of right AKA, still draining, planning washout 10/2 by Dr. Chestine Spore. May place wound vac.  - Continue ASA, statin  Failure to thrive, debility s/p bilateral AKA: Strongly suspect he will continue to be high  readmission risk if not discharged to SNF. Discharged home previously due to no payor source/medicaid pending.  - PT/OT. TOC is consulted for SNF placement to address barriers.   Left arm weakness: No acute change on neuroimaging.  - PT/OT  HTN:  - Continue norvasc, coreg (increased dose 9/30), hydralazine as prescribed at recent discharge  Tobacco use:  - Cessation counseling, nicotine patch  Adjustment reaction:  - SSRI started 9/28, will monitor for tolerance, efficacy  DNR: POA.   Hypokalemia:  - Supplemented and resolved, recheck preoperatively 10/2.  AKI: Cr 1.22 >> 0.88. Resolved. - Minimize nephrotoxins.   Moderate protein calorie malnutrition:  - RD consulted, supp protein.  Subjective: No complaints, no fever or pain.  Objective: Vitals:   05/30/22 1631 05/30/22 2058 05/31/22 0736 05/31/22 0914  BP: 128/84 (!) 136/96 (!) 141/93 (!) 147/87  Pulse: 95 95 94 96  Resp: 18 18 20 20   Temp: 98.6 F (37 C) 98.2 F (36.8 C) 98.5 F (36.9 C) 98.3 F (36.8 C)  TempSrc: Oral Oral Oral Oral  SpO2: 100% 100% 99% 97%  Weight:    48.4 kg  Height:    5\' 5"  (1.651 m)   Gen: 51 y.o. male in no distress Pulm: Nonlabored breathing room air. Clear. CV: Regular rate and rhythm. No murmur, rub, or gallop. No JVD, no dependent edema. GI: Abdomen soft, non-tender, non-distended, with normoactive bowel sounds.  Ext: Warm, dry bl AKAs Skin: R AKA site dressing not taken down this AM. Neuro: Alert and oriented. No focal neurological deficits. Psych: Judgement and insight appear fair. Mood depressed with flattened affect. Behavior is appropriate.    Data Personally reviewed: CBC: Recent Labs  Lab 05/26/22 2345  05/28/22 0817 05/29/22 0133 05/31/22 0159  WBC 13.9* 12.0* 11.4* 7.8  NEUTROABS 10.3* 9.3*  --   --   HGB 7.1* 12.1* 12.2* 11.5*  HCT 22.5* 35.5* 36.3* 35.2*  MCV 90.4 84.5 84.0 86.7  PLT 299 296 314 271   Basic Metabolic Panel: Recent Labs  Lab 05/26/22 2345  05/28/22 0817 05/29/22 0133 05/31/22 0159  NA 135 138 138 138  K 3.5 3.4* 3.6 3.7  CL 103 105 105 107  CO2 23 24 20* 22  GLUCOSE 101* 109* 113* 105*  BUN 12 13 11 16   CREATININE 1.22 0.97 0.88 0.99  CALCIUM 8.2* 8.8* 9.0 8.9   GFR: Estimated Creatinine Clearance: 61.1 mL/min (by C-G formula based on SCr of 0.99 mg/dL). Liver Function Tests: Recent Labs  Lab 05/26/22 2345  AST 19  ALT 22  ALKPHOS 110  BILITOT 0.6  PROT 6.0*  ALBUMIN 2.3*   No results for input(s): "LIPASE", "AMYLASE" in the last 168 hours. No results for input(s): "AMMONIA" in the last 168 hours. Coagulation Profile: Recent Labs  Lab 05/26/22 2345  INR 1.1   Cardiac Enzymes: No results for input(s): "CKTOTAL", "CKMB", "CKMBINDEX", "TROPONINI" in the last 168 hours. BNP (last 3 results) No results for input(s): "PROBNP" in the last 8760 hours. HbA1C: No results for input(s): "HGBA1C" in the last 72 hours. CBG: Recent Labs  Lab 05/28/22 2136 05/29/22 0625  GLUCAP 109* 104*   Lipid Profile: No results for input(s): "CHOL", "HDL", "LDLCALC", "TRIG", "CHOLHDL", "LDLDIRECT" in the last 72 hours. Thyroid Function Tests: No results for input(s): "TSH", "T4TOTAL", "FREET4", "T3FREE", "THYROIDAB" in the last 72 hours. Anemia Panel: No results for input(s): "VITAMINB12", "FOLATE", "FERRITIN", "TIBC", "IRON", "RETICCTPCT" in the last 72 hours. Urine analysis:    Component Value Date/Time   COLORURINE YELLOW 05/27/2022 0120   APPEARANCEUR HAZY (A) 05/27/2022 0120   LABSPEC 1.025 05/27/2022 0120   PHURINE 6.0 05/27/2022 0120   GLUCOSEU NEGATIVE 05/27/2022 0120   HGBUR SMALL (A) 05/27/2022 0120   BILIRUBINUR NEGATIVE 05/27/2022 0120   KETONESUR NEGATIVE 05/27/2022 0120   PROTEINUR NEGATIVE 05/27/2022 0120   NITRITE NEGATIVE 05/27/2022 0120   LEUKOCYTESUR MODERATE (A) 05/27/2022 0120   Recent Results (from the past 240 hour(s))  Blood Culture (routine x 2)     Status: None (Preliminary result)    Collection Time: 05/26/22 11:45 PM   Specimen: BLOOD LEFT FOREARM  Result Value Ref Range Status   Specimen Description BLOOD LEFT FOREARM  Final   Special Requests   Final    BOTTLES DRAWN AEROBIC AND ANAEROBIC Blood Culture adequate volume   Culture   Final    NO GROWTH 4 DAYS Performed at [facility], [address], [place], [place]    Report Status PENDING  Incomplete  Blood Culture (routine x 2)     Status: None (Preliminary result)   Collection Time: 05/26/22 11:45 PM   Specimen: BLOOD  Result Value Ref Range Status   Specimen Description BLOOD RIGHT ANTECUBITAL  Final   Special Requests   Final    BOTTLES DRAWN AEROBIC AND ANAEROBIC Blood Culture results may not be optimal due to an inadequate volume of blood received in culture bottles   Culture   Final    NO GROWTH 4 DAYS Performed at [facility], [address] [place], [place]    Report Status PENDING  Incomplete  Resp Panel by RT-PCR (Flu A&B, Covid)     Status:  None   Collection Time: 05/26/22 11:45 PM   Specimen: Nasal Swab  Result Value Ref Range Status   SARS Coronavirus 2 by RT PCR NEGATIVE NEGATIVE Final    Comment: (NOTE) SARS-CoV-2 target nucleic acids are NOT DETECTED.  The SARS-CoV-2 RNA is generally detectable in upper respiratory specimens during the acute phase of infection. The lowest concentration of SARS-CoV-2 viral copies this assay can detect is 138 copies/mL. A negative result does not preclude SARS-Cov-2 infection and should not be used as the sole basis for treatment or other patient management decisions. A negative result may occur with  improper specimen collection/handling, submission of specimen other than nasopharyngeal swab, presence of viral mutation(s) within the areas targeted by this assay, and inadequate number of viral copies(<138 copies/mL). A negative result must be combined with clinical observations, patient history, and  epidemiological information. The expected result is Negative.  Fact Sheet for Patients:  EntrepreneurPulse.com.au  Fact Sheet for Healthcare Providers:  IncredibleEmployment.be  This test is no t yet approved or cleared by the [place] FDA and  has been authorized for detection and/or diagnosis of SARS-CoV-2 by FDA under an Emergency Use Authorization (EUA). This EUA will remain  in effect (meaning this test can be used) for the duration of the COVID-19 declaration under Section 564(b)(1) of the Act, 21 U.S.C.section 360bbb-3(b)(1), unless the authorization is terminated  or revoked sooner.       Influenza A by PCR NEGATIVE NEGATIVE Final   Influenza B by PCR NEGATIVE NEGATIVE Final    Comment: (NOTE) The Xpert Xpress SARS-CoV-2/FLU/RSV plus assay is intended as an aid in the diagnosis of influenza from Nasopharyngeal swab specimens and should not be used as a sole basis for treatment. Nasal washings and aspirates are unacceptable for Xpert Xpress SARS-CoV-2/FLU/RSV testing.  Fact Sheet for Patients: EntrepreneurPulse.com.au  Fact Sheet for Healthcare Providers: IncredibleEmployment.be  This test is not yet approved or cleared by the [place] FDA and has been authorized for detection and/or diagnosis of SARS-CoV-2 by FDA under an Emergency Use Authorization (EUA). This EUA will remain in effect (meaning this test can be used) for the duration of the COVID-19 declaration under Section 564(b)(1) of the Act, 21 U.S.C. section 360bbb-3(b)(1), unless the authorization is terminated or revoked.  Performed at [facility], [place] [address], [place], [place] [address]   Urine Culture     Status: Abnormal   Collection Time: 05/27/22  1:20 AM   Specimen: In/Out Cath Urine  Result Value Ref Range Status   Specimen Description IN/OUT CATH URINE  Final   Special Requests   Final     NONE Performed at [facility], [place] [address], [place],  [address]    Culture (A)  Final    >=100,000 COLONIES/mL ESCHERICHIA COLI >=100,000 COLONIES/mL KLEBSIELLA OXYTOCA    Report Status 05/29/2022 FINAL  Final   Organism ID, Bacteria ESCHERICHIA COLI (A)  Final   Organism ID, Bacteria KLEBSIELLA OXYTOCA (A)  Final      Susceptibility   Escherichia coli - MIC*    AMPICILLIN <=2 SENSITIVE Sensitive     CEFAZOLIN <=4 SENSITIVE Sensitive     CEFEPIME <=0.12 SENSITIVE Sensitive     CEFTRIAXONE <=0.25 SENSITIVE Sensitive     CIPROFLOXACIN <=0.25 SENSITIVE Sensitive     GENTAMICIN <=1 SENSITIVE Sensitive     IMIPENEM <=0.25 SENSITIVE Sensitive     NITROFURANTOIN <=16 SENSITIVE Sensitive     TRIMETH/SULFA <=20 SENSITIVE Sensitive  AMPICILLIN/SULBACTAM <=2 SENSITIVE Sensitive     PIP/TAZO <=4 SENSITIVE Sensitive     * >=100,000 COLONIES/mL ESCHERICHIA COLI   Klebsiella oxytoca - MIC*    AMPICILLIN >=32 RESISTANT Resistant     CEFAZOLIN 8 SENSITIVE Sensitive     CEFEPIME <=0.12 SENSITIVE Sensitive     CEFTRIAXONE <=0.25 SENSITIVE Sensitive     CIPROFLOXACIN <=0.25 SENSITIVE Sensitive     GENTAMICIN <=1 SENSITIVE Sensitive     IMIPENEM <=0.25 SENSITIVE Sensitive     NITROFURANTOIN 32 SENSITIVE Sensitive     TRIMETH/SULFA <=20 SENSITIVE Sensitive     AMPICILLIN/SULBACTAM 16 INTERMEDIATE Intermediate     PIP/TAZO <=4 SENSITIVE Sensitive     * >=100,000 COLONIES/mL KLEBSIELLA OXYTOCA     No results found.  Family Communication: None at bedside, pt told me not to call anyone  Disposition: Status is: Inpatient Remains inpatient appropriate because: Pending washout R AKA wound.   Planned Discharge Destination: SNF recommended. TOC consulted.  Tyrone Nine, MD 05/31/2022 10:15 AM Page by [email or web address]

## 2022-06-01 ENCOUNTER — Ambulatory Visit: Payer: Self-pay | Admitting: Vascular Surgery

## 2022-06-01 ENCOUNTER — Encounter (HOSPITAL_COMMUNITY): Payer: Self-pay | Admitting: Vascular Surgery

## 2022-06-01 DIAGNOSIS — M6281 Muscle weakness (generalized): Secondary | ICD-10-CM

## 2022-06-01 DIAGNOSIS — D62 Acute posthemorrhagic anemia: Secondary | ICD-10-CM

## 2022-06-01 DIAGNOSIS — A419 Sepsis, unspecified organism: Secondary | ICD-10-CM

## 2022-06-01 DIAGNOSIS — I10 Essential (primary) hypertension: Secondary | ICD-10-CM

## 2022-06-01 DIAGNOSIS — I70223 Atherosclerosis of native arteries of extremities with rest pain, bilateral legs: Secondary | ICD-10-CM

## 2022-06-01 DIAGNOSIS — F17200 Nicotine dependence, unspecified, uncomplicated: Secondary | ICD-10-CM

## 2022-06-01 DIAGNOSIS — I739 Peripheral vascular disease, unspecified: Secondary | ICD-10-CM

## 2022-06-01 DIAGNOSIS — E440 Moderate protein-calorie malnutrition: Secondary | ICD-10-CM

## 2022-06-01 DIAGNOSIS — Z66 Do not resuscitate: Secondary | ICD-10-CM

## 2022-06-01 DIAGNOSIS — F4329 Adjustment disorder with other symptoms: Secondary | ICD-10-CM

## 2022-06-01 DIAGNOSIS — N390 Urinary tract infection, site not specified: Secondary | ICD-10-CM

## 2022-06-01 LAB — CULTURE, BLOOD (ROUTINE X 2)
Culture: NO GROWTH
Special Requests: ADEQUATE

## 2022-06-01 NOTE — Progress Notes (Signed)
Physical Therapy Treatment Patient Details Name: Jeffrey Fitzgerald MRN: [id number] DOB: 10-20-1970 Today's Date: 06/01/2022   History of Present Illness Jeffrey Fitzgerald is a 51 y.o. male admitted 9/27 who presented to the ED 9/27 feeling poorly with poor per oral intake, urinary symptoms, possible hematuria, and fevers documented by family/HH RN. He was found to be febrile with urinalysis suggestive of UTI.  PMH:  HTN, PAD with recent prolonged admission for BLE critical limb ischemia ultimately undergoing bilateral AKA, discharged home with right groin wound vac 9/20    PT Comments    Pt received supine and agreeable to session however pt continues to be limited b decreased activity tolerance and weakness. Pt able to come to sitting EOB with min assist and maintain for ~2 mins before transitioning back to supine secondary to pt stated fatigue and despite encouragement to remain sitting upright.  Pt with fair tolerance for LE therex for increased ROM and strength. Pt declining transfer to chair this session secondary to it being late in day, plan for earlier sessions going forward. Pt continues to benefit from skilled PT services to progress toward functional mobility goals.    Recommendations for follow up therapy are one component of a multi-disciplinary discharge planning process, led by the attending physician.  Recommendations may be updated based on patient status, additional functional criteria and insurance authorization.  Follow Up Recommendations  Skilled nursing-short term rehab (<3 hours/day) Can patient physically be transported by private vehicle: No   Assistance Recommended at Discharge Intermittent Supervision/Assistance  Patient can return home with the following Assistance with cooking/housework;Direct supervision/assist for financial management;Assist for transportation;Help with stairs or ramp for entrance;A little help with walking and/or transfers;A little help with  bathing/dressing/bathroom;Direct supervision/assist for medications management   Equipment Recommendations  None recommended by PT    Recommendations for Other Services       Precautions / Restrictions Precautions Precautions: Fall;Other (comment) Precaution Comments: R groin and R AKA wound vac Restrictions Weight Bearing Restrictions: Yes RLE Weight Bearing: Non weight bearing LLE Weight Bearing: Non weight bearing     Mobility  Bed Mobility Overal bed mobility: Needs Assistance Bed Mobility: Supine to Sit, Sit to Supine     Supine to sit: Min assist Sit to supine: Min assist   General bed mobility comments: min assist to scoot to EOB and to    Transfers                   General transfer comment: pt declingin transfer to chair due to late in day, plan to see earlier going forward    Ambulation/Gait               General Gait Details: unable   Stairs             Wheelchair Mobility    Modified Rankin (Stroke Patients Only)       Balance Overall balance assessment: Needs assistance Sitting-balance support: No upper extremity supported, Bilateral upper extremity supported Sitting balance-Leahy Scale: Poor Sitting balance - Comments: statically upright in bed without back support min guard relies on UE support                                    Cognition Arousal/Alertness: Awake/alert Behavior During Therapy: Flat affect Overall Cognitive Status: No family/caregiver present to determine baseline cognitive functioning Area of Impairment: Attention, Following commands, Safety/judgement, Awareness, Problem solving, Memory  Current Attention Level: Sustained Memory: Decreased short-term memory Following Commands: Follows one step commands consistently, Follows one step commands with increased time Safety/Judgement: Decreased awareness of safety, Decreased awareness of deficits Awareness:  Emergent Problem Solving: Requires verbal cues, Slow processing, Difficulty sequencing General Comments: pt requires increased time to follow commands, poor problem sovling and awareness to self care needs. decreased attention and recall to complete tasks during session.        Exercises Amputee Exercises Quad Sets: AROM, Right, Left, 10 reps, Supine Hip Extension: AROM, Right, Left, 10 reps, Supine Hip ABduction/ADduction: AROM, Right, Left, 10 reps, Supine Hip Flexion/Marching: AROM, Right, Left, 10 reps, Supine    General Comments General comments (skin integrity, edema, etc.): VSS on RA, provided red theraband and attached to bedrails at pt request as pt had on prior adminssion      Pertinent Vitals/Pain Pain Assessment Pain Assessment: Faces Faces Pain Scale: Hurts a little bit Pain Location: generalized Pain Descriptors / Indicators: Grimacing, Sore Pain Intervention(s): Monitored during session, Limited activity within patient's tolerance    Home Living     Available Help at Discharge: Family Type of Home: House                  Prior Function            PT Goals (current goals can now be found in the care plan section) Acute Rehab PT Goals PT Goal Formulation: With patient Time For Goal Achievement: 06/12/22    Frequency    Min 3X/week      PT Plan      Co-evaluation              AM-PAC PT "6 Clicks" Mobility   Outcome Measure  Help needed turning from your back to your side while in a flat bed without using bedrails?: A Little Help needed moving from lying on your back to sitting on the side of a flat bed without using bedrails?: A Little Help needed moving to and from a bed to a chair (including a wheelchair)?: A Little Help needed standing up from a chair using your arms (e.g., wheelchair or bedside chair)?: Total Help needed to walk in hospital room?: Total Help needed climbing 3-5 steps with a railing? : Total 6 Click Score:  12    End of Session   Activity Tolerance: Patient limited by fatigue Patient left: with call bell/phone within reach;in bed Nurse Communication: Mobility status PT Visit Diagnosis: Other abnormalities of gait and mobility (R26.89);Muscle weakness (generalized) (M62.81)     Time: 0454-0981 PT Time Calculation (min) (ACUTE ONLY): 20 min  Charges:  $Therapeutic Activity: 8-22 mins                     Wren Pryce R. PTA Acute Rehabilitation Services Office: [place] 06/01/2022, 4:23 PM

## 2022-06-01 NOTE — Progress Notes (Addendum)
  Progress Note    06/01/2022 7:25 AM 1 Day Post-Op  Subjective:  no complaints   Vitals:   05/31/22 2320 06/01/22 0636  BP: (!) 145/82 (!) 153/88  Pulse: 86 95  Resp: 20 20  Temp: 97.9 F (36.6 C) 97.7 F (36.5 C)  SpO2: 97% 100%   Physical Exam: Lungs:  non labored Incisions:  R groin and R AKA vacs with good seal Extremities:  L AKA well healed Neurologic: A&O  CBC    Component Value Date/Time   WBC 7.8 05/31/2022 0159   RBC 4.06 (L) 05/31/2022 0159   HGB 11.5 (L) 05/31/2022 0159   HCT 35.2 (L) 05/31/2022 0159   PLT 271 05/31/2022 0159   MCV 86.7 05/31/2022 0159   MCH 28.3 05/31/2022 0159   MCHC 32.7 05/31/2022 0159   RDW 16.3 (H) 05/31/2022 0159   LYMPHSABS 1.9 05/28/2022 0817   MONOABS 0.8 05/28/2022 0817   EOSABS 0.0 05/28/2022 0817   BASOSABS 0.0 05/28/2022 0817    BMET    Component Value Date/Time   NA 138 05/31/2022 0159   K 3.7 05/31/2022 0159   CL 107 05/31/2022 0159   CO2 22 05/31/2022 0159   GLUCOSE 105 (H) 05/31/2022 0159   BUN 16 05/31/2022 0159   CREATININE 0.99 05/31/2022 0159   CALCIUM 8.9 05/31/2022 0159   GFRNONAA >60 05/31/2022 0159   GFRAA >60 01/12/2020 1421    INR    Component Value Date/Time   INR 1.1 05/26/2022 2345     Intake/Output Summary (Last 24 hours) at 06/01/2022 0725 Last data filed at 05/31/2022 1606 Gross per 24 hour  Intake 800 ml  Output 575 ml  Net 225 ml     Assessment/Plan:  51 y.o. male is s/p R AKA I&D 1 Day Post-Op   R groin and AKA vacs working well Continue current antibiotics; awaiting culture data Ok for discharge back to SNF with wound vacs when antibiotic regimen finalized   Dagoberto Ligas, PA-C [facility] [phone] 06/01/2022 7:25 AM  I have seen and evaluated the patient. I agree with the PA note as documented above.  Postop day 1 status post I&D of right above-knee amputation.  All the muscle in AKA was viable.  Wound VAC with good seal.  No growth on  cultures.  Will need VAC to right groin and right AKA at discharge.  Marty Heck, MD [facility] [place] Office: [phone]

## 2022-06-01 NOTE — Evaluation (Signed)
Speech Language Pathology Evaluation Patient Details Name: Jeffrey Fitzgerald MRN: [id number] DOB: 23-Sep-1970 Today's Date: 06/01/2022 Time: 4193-7902 SLP Time Calculation (min) (ACUTE ONLY): 12 min  Problem List:  Patient Active Problem List   Diagnosis Date Noted   Malnutrition of moderate degree 05/29/2022   Sepsis secondary to UTI (HCC) 05/27/2022   Muscle weakness of left upper extremity 05/27/2022   Tobacco dependence 05/27/2022   Adjustment reaction 05/27/2022   DNR (do not resuscitate) 05/27/2022   Acute postoperative anemia due to expected blood loss    Gastroesophageal reflux disease    SIRS (systemic inflammatory response syndrome) (HCC) 04/27/2022   Hypertensive urgency 04/27/2022   Hypokalemia 04/27/2022   Pressure injury of skin 04/13/2022   Acute renal failure (HCC)    Other specified anemias    PAD (peripheral artery disease) (HCC) 04/07/2022   Critical limb ischemia of both lower extremities (HCC) 04/01/2022   Critical limb ischemia of left lower extremity with ulceration of ankle (HCC) 03/30/2022   Iliac artery occlusion, 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   Hypertension 01/25/2020   Left foot pain 01/25/2020   Past Medical History:  Past Medical History:  Diagnosis Date   Hypertension    Peripheral vascular disease (HCC)    Tobacco abuse    Past Surgical History:  Past Surgical History:  Procedure Laterality Date   ABDOMINAL AORTOGRAM W/LOWER EXTREMITY N/A 04/01/2022   Procedure: ABDOMINAL AORTOGRAM LOWER EXTREMITY [place];  Surgeon: Cephus Shelling, MD;  Location: [facility];  Service: Cardiovascular;  Laterality: N/A;   AMPUTATION Right 04/22/2022   Procedure: RIGHT ABOVE KNEE AMPUTATION WITH WOUND VAC SPONGE CHANGEOUT TO RIGHT GROIN;  Surgeon: Nada Libman, MD;  Location: [facility] OR;  Service: Vascular;  Laterality: Right;   AMPUTATION Left 04/30/2022   Procedure: AMPUTATION ABOVE KNEE;  Surgeon: Maeola Harman, MD;  Location[facility];  Service:  Vascular;  Laterality: Left;   AORTA - BILATERAL FEMORAL ARTERY BYPASS GRAFT Bilateral 04/07/2022   Procedure: AORTA BIFEMORAL BYPASS WITH LEFT LEG BYPASS;  Surgeon: Cephus Shelling, MD;  Location: [facility] OR;  Service: Vascular;  Laterality: Bilateral;   APPLICATION OF WOUND VAC Right 04/08/2022   Procedure: APPLICATION OF WOUND VAC;  Surgeon: Leonie Douglas, MD;  Location: [facility] OR;  Service: Vascular;  Laterality: Right;   APPLICATION OF WOUND VAC Right 04/20/2022   Procedure: APPLICATION OF WOUND VAC;  Surgeon: Maeola Harman, MD;  Location: [facility] OR;  Service: Vascular;  Laterality: Right;   APPLICATION OF WOUND VAC Right 05/31/2022   Procedure: RIGHT GROIN WOUND VAC CHANGE;  Surgeon: Victorino Sparrow, MD;  Location: [facility] OR;  Service: Vascular;  Laterality: Right;   FEMORAL ARTERY EXPLORATION Right 04/20/2022   Procedure: EXPLORATION OF RIGHT GROIN;  Surgeon: Maeola Harman, MD;  Location: [facility] OR;  Service: Vascular;  Laterality: Right;   FEMORAL-POPLITEAL BYPASS GRAFT Left 04/07/2022   Procedure: LEFT LEG BYPASS GRAFT FEMORAL-POPLITEAL ARTERY;  Surgeon: Cephus Shelling, MD;  Location: [facility] OR;  Service: Vascular;  Laterality: Left;   FEMORAL-TIBIAL BYPASS GRAFT Right 04/08/2022   Procedure: GREATER SAPHENOUS VEIN HARVEST, RIGHT LEG BYPASS GRAFT RIGHT COMMON FEMORAL-TIBIAL ARTERY WITH COMPOSITE GRAFT;  Surgeon: Leonie Douglas, MD;  Location: [facility] OR;  Service: Vascular;  Laterality: Right;   INCISION AND DRAINAGE OF WOUND Right 05/31/2022   Procedure: IRRIGATION AND DEBRIDEMENT RIGHT ABOVE KNEE AMPUTAION;  Surgeon: Victorino Sparrow, MD;  Location: [facility] OR;  Service: Vascular;  Laterality: Right;   LOWER EXTREMITY ANGIOGRAM  Right 04/08/2022   Procedure: LOWER EXTREMITY ANGIOGRAM;  Surgeon: Cherre Robins, MD;  Location: [place];  Service: Vascular;  Laterality: Right;   PATCH ANGIOPLASTY Right 04/07/2022   Procedure: PATCH ANGIOPLASTY USING [employer]OLOGIC;  Surgeon: Marty Heck, MD;  Location: [place];  Service: Vascular;  Laterality: Right;   THIGH FASCIOTOMY Right 04/08/2022   Procedure: FASCIOTOMIES;  Surgeon: Cherre Robins, MD;  Location: [place];  Service: Vascular;  Laterality: Right;   THROMBECTOMY FEMORAL ARTERY Right 04/07/2022   Procedure: RIGHT FEMORAL ARTERY THROMBECTOMY;  Surgeon: Marty Heck, MD;  Location: [place];  Service: Vascular;  Laterality: Right;   THROMBECTOMY FEMORAL ARTERY Right 04/08/2022   Procedure: THROMBECTOMY POSTERIOR TIBIAL;  Surgeon: Cherre Robins, MD;  Location: [place];  Service: Vascular;  Laterality: Right;   THROMBECTOMY FEMORAL ARTERY Bilateral 04/09/2022   Procedure: LEFT  THROMBECTOMY FEMORAL POSTERIOR TIBIAL BYPASS AND RIGHT LATERAL  LOWER EXTREMITY FASCIOTOMY CLOSURE  AND WOUND [place];  Surgeon: Marty Heck, MD;  Location: [place];  Service: Vascular;  Laterality: Bilateral;   WOUND DEBRIDEMENT Right 04/20/2022   Procedure: IRRIGATION AND DEBRIDMENT OF LOWER LEG AND APPLICATION WOUND VAC GROIN;  Surgeon: Broadus John, MD;  Location: [place];  Service: Vascular;  Laterality: Right;   WOUND DEBRIDEMENT Right 04/20/2022   Procedure: DEBRIDEMENT WOUND, repaired anstamosis femoral artery and creation of sartorious flap.;  Surgeon: Waynetta Sandy, MD;  Location: [place];  Service: Vascular;  Laterality: Right;   HPI:  Jeffrey Fitzgerald is a 51 y.o. male admitted 9/27 who presented to the ED 9/27 feeling poorly with poor oral intake, urinary symptoms, possible hematuria, and fevers documented by family/HH RN. He was found to be febrile with urinalysis suggestive of UTI.  PMH:  HTN, PAD with recent prolonged admission for BLE critical limb ischemia ultimately undergoing bilateral AKA, discharged home with right groin wound vac 9/20. PT reports fluctuating cognition during prior admission with poor participation and waxing/waning problem solving. Chronic ischemic changes in white matter, BG, pons and cerebellum  per head CT.  Assessment / Plan / Recommendation Clinical Impression  Pt participated in limited cognitive assessment, reluctant to participate.  He was oriented x4, demonstrated adequate verbal recall (immediate and delayed), adequate verbal problem-solving. Awareness of his circumstances and medical condition was compromised.  He was disinterested and demonstrated low initiation.  Speech/language were WNL. There are no specific areas identified that would benefit from SLP intervention. D/W PT. Our service will sign off.    SLP Assessment  SLP Recommendation/Assessment: Patient does not need any further Speech [facility] SLP Visit Diagnosis: Cognitive communication deficit (R41.841)    Recommendations for follow up therapy are one component of a multi-disciplinary discharge planning process, led by the attending physician.  Recommendations may be updated based on patient status, additional functional criteria and insurance authorization.    Follow Up Recommendations  No SLP follow up    Assistance Recommended at Discharge  Frequent or constant Supervision/Assistance                 SLP Evaluation Cognition  Overall Cognitive Status: History of fluctuating cognition during lengthy, prior admission Arousal/Alertness: Awake/alert Orientation Level: Oriented X4 Attention: Selective Selective Attention: Appears intact Memory: Appears intact Awareness: Impaired Awareness Impairment: Emergent impairment Problem Solving: Appears intact       Comprehension  Auditory Comprehension Overall Auditory Comprehension: Appears within functional limits for tasks assessed Reading Comprehension Reading Status: Not tested  Expression Expression Primary Mode of Expression: Verbal Verbal Expression Overall Verbal Expression: Appears within functional limits for tasks assessed Written Expression Dominant Hand: Right   Oral / Motor  Motor Speech Overall Motor Speech:  Appears within functional limits for tasks assessed            Blenda Mounts [place] 06/01/2022, 4:10 PM

## 2022-06-01 NOTE — Progress Notes (Signed)
Progress Note  Patient: Jeffrey Fitzgerald [id number] DOB: Jul 21, 1971  DOA: 05/26/2022  DOS: 06/01/2022    Brief hospital course: Jeffrey Fitzgerald is a 51 y.o. male with medical history significant of HTN, PAD with recent prolonged admission for BLE critical limb ischemia ultimately undergoing bilateral AKA, discharged home with wound vac 9/20 who presented to the ED 9/27 feeling poorly with poor per oral intake, urinary symptoms, possible hematuria, and fevers documented by family/HH RN. He was found to be febrile with urinalysis suggestive of UTI. Also changes on CT around the aortic graft for which vascular surgery was consulted, feeling this is expected appearance postoperatively. No graft infection currently noted. Vascular surgery considering right AKA site washout. Hemodynamically patient is stabilizing on IV antibiotics.   10/2: Right AKA site washout, wound vac   Assessment and Plan: Sepsis due to UTI: With leukocytosis, tachycardia, tachypnea, encephalopathy. Leukocytosis resolved as of 10/2.  - Urine culture with E. coli, Klebsiella, negative blood cultures from admission. Improved with cefepime.   Right AKA stump infection: s/p washout and wound vac placement 10/2.  - Cultures have been reincubated and are pending. Will continue cefepime for now pending cultures and placement. Once with suitable disposition, can transition to oral abx.     ABLA: s/p 2u PRBCs on admission.  - Hgb stable, can recheck postoperatively.  Acute encephalopathy: Pt more withdrawn from baseline per report.  - Delirium precautions  PAD s/p aortobifemoral bypass with B femoral endarterectomy with poor healing and other surgical complications eventually resulting in R AKA and then L AKA. - Blood cultures negative, low suspicion for graft infection at this time.  - Continue wound vac to right AKA stump site placed 10/3.  - Continue wound vac to right groin, changes MWF per vascular surgery - Continue ASA,  statin  Failure to thrive, debility s/p bilateral AKA: Strongly suspect he will continue to be high readmission risk if not discharged to SNF. Discharged home previously due to no payor source/medicaid pending.  - PT/OT. TOC is consulted for SNF placement to address barriers.   Left arm weakness: No acute change on neuroimaging.  - PT/OT  HTN:  - Continue norvasc, coreg (increased dose 9/30), hydralazine as prescribed at recent discharge  Tobacco use:  - Cessation counseling, nicotine patch  Adjustment reaction:  - SSRI started 9/28, will monitor for tolerance, efficacy  DNR: POA.   Hypokalemia:  - Supplemented and resolved, recheck preoperatively 10/2.  AKI: Cr 1.22 >> 0.88. Resolved. - Minimize nephrotoxins.   Moderate protein calorie malnutrition:  - RD consulted, supp protein.  Subjective: Again volunteers no verbal output, but denies pain. Has taken no prn's. Eating ok.   Objective: Vitals:   06/01/22 0636 06/01/22 0700 06/01/22 0847 06/01/22 1000  BP: (!) 153/88 (!) 154/90 (!) 162/87 137/75  Pulse: 95 94  98  Resp: 20 18  18   Temp: 97.7 F (36.5 C) 98.6 F (37 C)  98.7 F (37.1 C)  TempSrc: Oral Oral  Oral  SpO2: 100% 97%  96%  Weight:      Height:       Gen: 51 y.o. male in no distress Pulm: Nonlabored breathing room air. Clear. CV: Regular rate and rhythm. No murmur, rub, or gallop. No JVD, no dependent edema. GI: Abdomen soft, non-tender, non-distended, with normoactive bowel sounds.  Ext: RLE AKA site with wound vac and good seal, minimal surrounding erythema. R groin site with wound vac and good seal, no surrounding erythema or other discharge. Both appropriately  tender to palpation. Skin: No other/new rashes, lesions or ulcers on visualized skin. Neuro: Alert and oriented. No focal neurological deficits. Psych: Judgement and insight appear fair. Mood depressed, restricted affect. Calm.   Data Personally reviewed: CBC: Recent Labs  Lab  05/26/22 2345 05/28/22 0817 05/29/22 0133 05/31/22 0159  WBC 13.9* 12.0* 11.4* 7.8  NEUTROABS 10.3* 9.3*  --   --   HGB 7.1* 12.1* 12.2* 11.5*  HCT 22.5* 35.5* 36.3* 35.2*  MCV 90.4 84.5 84.0 86.7  PLT 299 296 314 294   Basic Metabolic Panel: Recent Labs  Lab 05/26/22 2345 05/28/22 0817 05/29/22 0133 05/31/22 0159  NA 135 138 138 138  K 3.5 3.4* 3.6 3.7  CL 103 105 105 107  CO2 23 24 20* 22  GLUCOSE 101* 109* 113* 105*  BUN 12 13 11 16   CREATININE 1.22 0.97 0.88 0.99  CALCIUM 8.2* 8.8* 9.0 8.9   GFR: Estimated Creatinine Clearance: 61.1 mL/min (by C-G formula based on SCr of 0.99 mg/dL). Liver Function Tests: Recent Labs  Lab 05/26/22 2345  AST 19  ALT 22  ALKPHOS 110  BILITOT 0.6  PROT 6.0*  ALBUMIN 2.3*   No results for input(s): "LIPASE", "AMYLASE" in the last 168 hours. No results for input(s): "AMMONIA" in the last 168 hours. Coagulation Profile: Recent Labs  Lab 05/26/22 2345  INR 1.1   Cardiac Enzymes: No results for input(s): "CKTOTAL", "CKMB", "CKMBINDEX", "TROPONINI" in the last 168 hours. BNP (last 3 results) No results for input(s): "PROBNP" in the last 8760 hours. HbA1C: No results for input(s): "HGBA1C" in the last 72 hours. CBG: Recent Labs  Lab 05/28/22 2136 05/29/22 0625 05/31/22 1126  GLUCAP 109* 104* 103*   Lipid Profile: No results for input(s): "CHOL", "HDL", "LDLCALC", "TRIG", "CHOLHDL", "LDLDIRECT" in the last 72 hours. Thyroid Function Tests: No results for input(s): "TSH", "T4TOTAL", "FREET4", "T3FREE", "THYROIDAB" in the last 72 hours. Anemia Panel: No results for input(s): "VITAMINB12", "FOLATE", "FERRITIN", "TIBC", "IRON", "RETICCTPCT" in the last 72 hours. Urine analysis:    Component Value Date/Time   COLORURINE YELLOW 05/27/2022 0120   APPEARANCEUR HAZY (A) 05/27/2022 0120   LABSPEC 1.025 05/27/2022 0120   PHURINE 6.0 05/27/2022 0120   GLUCOSEU NEGATIVE 05/27/2022 0120   HGBUR SMALL (A) 05/27/2022 0120    BILIRUBINUR NEGATIVE 05/27/2022 0120   KETONESUR NEGATIVE 05/27/2022 0120   PROTEINUR NEGATIVE 05/27/2022 0120   NITRITE NEGATIVE 05/27/2022 0120   LEUKOCYTESUR MODERATE (A) 05/27/2022 0120   Recent Results (from the past 240 hour(s))  Blood Culture (routine x 2)     Status: None   Collection Time: 05/26/22 11:45 PM   Specimen: BLOOD LEFT FOREARM  Result Value Ref Range Status   Specimen Description BLOOD LEFT FOREARM  Final   Special Requests   Final    BOTTLES DRAWN AEROBIC AND ANAEROBIC Blood Culture adequate volume   Culture   Final    NO GROWTH 5 DAYS Performed at [facility], [place] [address], [place], [place] [address]    Report Status 06/01/2022 FINAL  Final  Blood Culture (routine x 2)     Status: None (Preliminary result)   Collection Time: 05/26/22 11:45 PM   Specimen: BLOOD  Result Value Ref Range Status   Specimen Description BLOOD RIGHT ANTECUBITAL  Final   Special Requests   Final    BOTTLES DRAWN AEROBIC AND ANAEROBIC Blood Culture results may not be optimal due to an inadequate volume of blood received in culture bottles  Culture  Setup Time   Final    GRAM POSITIVE RODS ANAEROBIC BOTTLE ONLY CRITICAL RESULT CALLED TO, READ BACK BY AND VERIFIED WITH: [place] [id number] FCP Performed at [facility], [place] [address] [place], [place] [address]    Culture GRAM POSITIVE RODS  Final   Report Status PENDING  Incomplete  Resp Panel by RT-PCR (Flu A&B, Covid)     Status: None   Collection Time: 05/26/22 11:45 PM   Specimen: Nasal Swab  Result Value Ref Range Status   SARS Coronavirus 2 by RT PCR NEGATIVE NEGATIVE Final    Comment: (NOTE) SARS-CoV-2 target nucleic acids are NOT DETECTED.  The SARS-CoV-2 RNA is generally detectable in upper respiratory specimens during the acute phase of infection. The lowest concentration of SARS-CoV-2 viral copies this assay can detect is 138 copies/mL. A negative result does not preclude  SARS-Cov-2 infection and should not be used as the sole basis for treatment or other patient management decisions. A negative result may occur with  improper specimen collection/handling, submission of specimen other than nasopharyngeal swab, presence of viral mutation(s) within the areas targeted by this assay, and inadequate number of viral copies(<138 copies/mL). A negative result must be combined with clinical observations, patient history, and epidemiological information. The expected result is Negative.  Fact Sheet for Patients:  EntrepreneurPulse.com.au  Fact Sheet for Healthcare Providers:  IncredibleEmployment.be  This test is no t yet approved or cleared by the [place] FDA and  has been authorized for detection and/or diagnosis of SARS-CoV-2 by FDA under an Emergency Use Authorization (EUA). This EUA will remain  in effect (meaning this test can be used) for the duration of the COVID-19 declaration under Section 564(b)(1) of the Act, 21 U.S.C.section 360bbb-3(b)(1), unless the authorization is terminated  or revoked sooner.       Influenza A by PCR NEGATIVE NEGATIVE Final   Influenza B by PCR NEGATIVE NEGATIVE Final    Comment: (NOTE) The Xpert Xpress SARS-CoV-2/FLU/RSV plus assay is intended as an aid in the diagnosis of influenza from Nasopharyngeal swab specimens and should not be used as a sole basis for treatment. Nasal washings and aspirates are unacceptable for Xpert Xpress SARS-CoV-2/FLU/RSV testing.  Fact Sheet for Patients: EntrepreneurPulse.com.au  Fact Sheet for Healthcare Providers: IncredibleEmployment.be  This test is not yet approved or cleared by the [place] FDA and has been authorized for detection and/or diagnosis of SARS-CoV-2 by FDA under an Emergency Use Authorization (EUA). This EUA will remain in effect (meaning this test can be used) for the duration of  the COVID-19 declaration under Section 564(b)(1) of the Act, 21 U.S.C. section 360bbb-3(b)(1), unless the authorization is terminated or revoked.  Performed at [facility], [place] [address], [place], [place] [address]   Urine Culture     Status: Abnormal   Collection Time: 05/27/22  1:20 AM   Specimen: In/Out Cath Urine  Result Value Ref Range Status   Specimen Description IN/OUT CATH URINE  Final   Special Requests   Final    NONE Performed at [facility], [place] [address], [place], [place] [address]    Culture (A)  Final    >=100,000 COLONIES/mL ESCHERICHIA COLI >=100,000 COLONIES/mL KLEBSIELLA OXYTOCA    Report Status 05/29/2022 FINAL  Final   Organism ID, Bacteria ESCHERICHIA COLI (A)  Final   Organism ID, Bacteria KLEBSIELLA OXYTOCA (A)  Final      Susceptibility   Escherichia coli -  MIC*    AMPICILLIN <=2 SENSITIVE Sensitive     CEFAZOLIN <=4 SENSITIVE Sensitive     CEFEPIME <=0.12 SENSITIVE Sensitive     CEFTRIAXONE <=0.25 SENSITIVE Sensitive     CIPROFLOXACIN <=0.25 SENSITIVE Sensitive     GENTAMICIN <=1 SENSITIVE Sensitive     IMIPENEM <=0.25 SENSITIVE Sensitive     NITROFURANTOIN <=16 SENSITIVE Sensitive     TRIMETH/SULFA <=20 SENSITIVE Sensitive     AMPICILLIN/SULBACTAM <=2 SENSITIVE Sensitive     PIP/TAZO <=4 SENSITIVE Sensitive     * >=100,000 COLONIES/mL ESCHERICHIA COLI   Klebsiella oxytoca - MIC*    AMPICILLIN >=32 RESISTANT Resistant     CEFAZOLIN 8 SENSITIVE Sensitive     CEFEPIME <=0.12 SENSITIVE Sensitive     CEFTRIAXONE <=0.25 SENSITIVE Sensitive     CIPROFLOXACIN <=0.25 SENSITIVE Sensitive     GENTAMICIN <=1 SENSITIVE Sensitive     IMIPENEM <=0.25 SENSITIVE Sensitive     NITROFURANTOIN 32 SENSITIVE Sensitive     TRIMETH/SULFA <=20 SENSITIVE Sensitive     AMPICILLIN/SULBACTAM 16 INTERMEDIATE Intermediate     PIP/TAZO <=4 SENSITIVE Sensitive     * >=100,000 COLONIES/mL KLEBSIELLA OXYTOCA  Aerobic/Anaerobic Culture w Gram Stain  (surgical/deep wound)     Status: None (Preliminary result)   Collection Time: 05/31/22 11:30 AM   Specimen: Leg, Right; Wound  Result Value Ref Range Status   Specimen Description WOUND  Final   Special Requests GROIN  Final   Gram Stain   Final    ABUNDANT WBC PRESENT, PREDOMINANTLY PMN NO ORGANISMS SEEN    Culture   Final    CULTURE REINCUBATED FOR BETTER GROWTH Performed at [facility], [address] [place], [place] [address]    Report Status PENDING  Incomplete     No results found.  Family Communication: None at bedside, pt told me not to call anyone  Disposition: Status is: Inpatient Remains inpatient appropriate because: Medically optimized for discharge to SNF which is pending LOG and bed availability.  Planned Discharge Destination: SNF recommended. TOC consulted.  Patrecia Pour, MD 06/01/2022 12:09 PM Page by [email or web address]

## 2022-06-01 NOTE — TOC Initial Note (Signed)
Transition of Care (TOC) - Initial/Assessment Note  Marvetta Gibbons RN, BSN Transitions of Care Unit 4E- RN Case Manager See Treatment Team for direct phone #    Patient Details  Name: Jeffrey Fitzgerald MRN: [id number] Date of Birth: Nov 20, 1970  Transition of Care [facility]) CM/SW Contact:    Dawayne Patricia, RN Phone Number: 06/01/2022, 3:55 PM  Clinical Narrative:                 Pt well known to this CM from last hospitalization, readmit from home w/ sister. Active w/ [facility] for home wound VAC drsg needs under [facility] program. Pt also has [facility] home wound VAC for right groin under [facility] program- home VAC at bedside in closet.   Pt s/p washout of right AKA sight with wound VAC placement- per vascular pt has both right groin and right AKA wound VACs Y'd together into one VAC machine. Pt will need home VAC drsg to both sights. - CM has reached to [facility] and spoke with hospital liaison- Olivia Mackie- pt will be able to stay under the [facility] and continue with the Y'd sites at home- Olivia Mackie will arrange to have extra drsg change supplies sent to home. Pt will also need canister for home VAC prior to d/c- Olivia Mackie to bring one to hospital. No further orders/paperwork neededOlivia Mackie to send updated OP noted for new R AKA wound VAC to [facility].   CM also reached out to [facility] regarding possible LOG for SNF placement- at this time pt has no SNF bed offers and per [facility] leadership would not be a candidate for an LOG at this time. Plan will be for pt to return home with sister and [facility] services.  CM spoke with Caryl Pina at [facility] who confirms that pt can continue with them for [facility] needs and they can add the addition wound VAC drsg change to R-AKA sight- pt will need resumption orders for HHRN/PT  CM reached out to [facility] regarding pending Medicaid- per [place]'s return email- the f/u communication noted "From the note on 05/20/22 first source has been  following up and stated that the patients Medicaid is still pending a decision from [place]. It can take them a few months sometimes to determine if a person is disabled."  Call made to Patty Sermons to review the above and plan at this time to return home w/ [facility] pending medical readiness. Cultures pending at this time. [name] voiced understanding and will be ready for pt's return, she also voiced that she is working on possible private pay options for facility placement until pt gets approved for Medicaid.   TOC to continue to follow, Pt will need ambulance transport home. (GOLD DNR)   Expected Discharge Plan: [place] Barriers to Discharge: Continued Medical Work up   Patient Goals and CMS Choice Patient states their goals for this hospitalization and ongoing recovery are:: return home w/ resumption of [facility] services CMS Medicare.gov Compare Post Acute Care list provided to:: Patient Choice offered to / list presented to : Patient, Sibling  Expected Discharge Plan and Services Expected Discharge Plan: [place]   Discharge Planning Services: CM Consult Post Acute Care Choice: Home Health, Resumption of Svcs/PTA Provider Living arrangements for the past 2 months: Single Family Home                 DME Arranged: Vac DME Agency: [facility] Date DME Agency  Contacted: 06/01/22 Time DME Agency Contacted: 1100 Representative spoke with at DME Agency: [place] HH Arranged: RN, PT [facility] Agency: [facility] Date HH Agency Contacted: 06/01/22 Time HH Agency Contacted: 1100 Representative spoke with at [facility] Agency: Morrie Sheldon  Prior Living Arrangements/Services Living arrangements for the past 2 months: Single Family Home Lives with:: Self, Siblings Patient language and need for interpreter reviewed:: Yes Do you feel safe going back to the place where you live?: Yes      Need for Family Participation in Patient Care: Yes (Comment) Care giver support system in  place?: Yes (comment) Current home services: DME (scooter, walker, wheelchair, hospital bed) Criminal Activity/Legal Involvement Pertinent to Current Situation/Hospitalization: No - Comment as needed  Activities of Daily Living      Permission Sought/Granted Permission sought to share information with : Facility Industrial/product designer granted to share information with : Yes, Verbal Permission Granted  Share Information with NAME: [name]  Permission granted to share info w AGENCY: HH/DME  Permission granted to share info w Relationship: sister  Permission granted to share info w Contact Information: [name]  Emotional Assessment Appearance:: Appears stated age Attitude/Demeanor/Rapport: Engaged Affect (typically observed): Calm Orientation: : Oriented to Self, Oriented to Place, Oriented to  Time, Oriented to Situation Alcohol / Substance Use: Tobacco Use Psych Involvement: No (comment)  Admission diagnosis:  Fever [R50.9] Sepsis secondary to UTI (HCC) [A41.9, N39.0] Fever, unspecified fever cause [R50.9] Periumbilical abdominal tenderness with rebound tenderness [R10.825] Patient Active Problem List   Diagnosis Date Noted   Malnutrition of moderate degree 05/29/2022   Sepsis secondary to UTI (HCC) 05/27/2022   Muscle weakness of left upper extremity 05/27/2022   Tobacco dependence 05/27/2022   Adjustment reaction 05/27/2022   DNR (do not resuscitate) 05/27/2022   Acute postoperative anemia due to expected blood loss    Gastroesophageal reflux disease    SIRS (systemic inflammatory response syndrome) (HCC) 04/27/2022   Hypertensive urgency 04/27/2022   Hypokalemia 04/27/2022   Pressure injury of skin 04/13/2022   Acute renal failure (HCC)    Other specified anemias    PAD (peripheral artery disease) (HCC) 04/07/2022   Critical limb ischemia of both lower extremities (HCC) 04/01/2022   Critical limb ischemia of left lower extremity with ulceration of  ankle (HCC) 03/30/2022   Iliac artery occlusion, 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   Hypertension 01/25/2020   Left foot pain 01/25/2020   PCP:  Patient, No Pcp Per Pharmacy:   [id number] - [place], [place] - [address] AT [facility] OF [address] [place] [address] Phone: [phone] Fax: [phone]  [id number] [place],  - [address] AT [facility] OF [facility] [address] [place] [address] Phone: [phone] Fax: [phone]  [facility] [address] [place] [address] Phone: [phone] Fax: [phone]     Social Determinants of Health (SDOH) Interventions    Readmission Risk Interventions    05/18/2022    4:16 PM  Readmission Risk Prevention Plan  Post Dischage Appt Complete  Medication Screening Complete  Transportation Screening Complete

## 2022-06-02 LAB — CBC
HCT: 33.3 % — ABNORMAL LOW (ref 39.0–52.0)
Hemoglobin: 10.6 g/dL — ABNORMAL LOW (ref 13.0–17.0)
MCH: 27.9 pg (ref 26.0–34.0)
MCHC: 31.8 g/dL (ref 30.0–36.0)
MCV: 87.6 fL (ref 80.0–100.0)
Platelets: 222 10*3/uL (ref 150–400)
RBC: 3.8 MIL/uL — ABNORMAL LOW (ref 4.22–5.81)
RDW: 16 % — ABNORMAL HIGH (ref 11.5–15.5)
WBC: 8.8 10*3/uL (ref 4.0–10.5)
nRBC: 0 % (ref 0.0–0.2)

## 2022-06-02 LAB — BASIC METABOLIC PANEL
Anion gap: 9 (ref 5–15)
BUN: 17 mg/dL (ref 6–20)
CO2: 24 mmol/L (ref 22–32)
Calcium: 8.8 mg/dL — ABNORMAL LOW (ref 8.9–10.3)
Chloride: 105 mmol/L (ref 98–111)
Creatinine, Ser: 0.83 mg/dL (ref 0.61–1.24)
GFR, Estimated: >60 mL/min (ref >60)
Glucose, Bld: 103 mg/dL — ABNORMAL HIGH (ref 70–99)
Potassium: 4 mmol/L (ref 3.5–5.1)
Sodium: 138 mmol/L (ref 135–145)

## 2022-06-02 LAB — CULTURE, BLOOD (ROUTINE X 2)

## 2022-06-02 MED ORDER — ENOXAPARIN SODIUM 40 MG/0.4ML IJ SOSY
40.0000 mg | PREFILLED_SYRINGE | INTRAMUSCULAR | Status: DC
  Administered 2022-06-02 – 2022-06-03 (×2): 40 mg via SUBCUTANEOUS
  Filled 2022-06-02 (×2): qty 0.4

## 2022-06-02 MED ORDER — CEFAZOLIN SODIUM-DEXTROSE 1-4 GM/50ML-% IV SOLN
1.0000 g | Freq: Three times a day (TID) | INTRAVENOUS | Status: DC
  Administered 2022-06-02 – 2022-06-04 (×6): 1 g via INTRAVENOUS
  Filled 2022-06-02 (×7): qty 50

## 2022-06-02 NOTE — Progress Notes (Signed)
PROGRESS NOTE    Jeffrey Fitzgerald  HWE:[id number] DOB: September 11, 1970 DOA: 05/26/2022 PCP: Patient, No Pcp Per     Brief Narrative:  Jeffrey Fitzgerald is a 51 y.o. male with medical history significant of HTN, PAD with recent prolonged admission for BLE critical limb ischemia ultimately undergoing bilateral AKA, discharged home with wound vac 9/20 who presented to the ED 9/27 feeling poorly with poor oral intake, urinary symptoms, possible hematuria, and fevers documented by family/HH RN. He was found to be febrile with urinalysis suggestive of UTI. Also changes on CT around the aortic graft for which vascular surgery was consulted, feeling this is expected appearance postoperatively. No graft infection currently noted. Vascular surgery considering right AKA site washout. Hemodynamically patient is stabilizing on IV antibiotics.    10/2: Right AKA site washout, wound vac 10/4: Right AKA vac change   New events last 24 hours / Subjective: Patient with poor appetite.  Voices no new complaints.  Assessment & Plan:  Principal Problem:   Sepsis secondary to UTI [facility]) Active Problems:   Hypertension   Critical limb ischemia of both lower extremities ([place])   PAD (peripheral artery disease) ([place])   Acute postoperative anemia due to expected blood loss   Muscle weakness of left upper extremity   Tobacco dependence   Adjustment reaction   DNR (do not resuscitate)   Malnutrition of moderate degree   Sepsis due to UTI -With leukocytosis, tachycardia, tachypnea, encephalopathy. Leukocytosis resolved as of 10/2.  -Urine culture with E. coli, Klebsiella, negative blood cultures from admission. Improved with cefepime.    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 -S/p washout and wound vac placement 10/2.  -Continue ancef  -Wound culture is pending    ABLA -S/p 2u PRBCs  -Stable  Acute metabolic encephalopathy -Continue to monitor   Failure to thrive, debility s/p bilateral AKA -Strongly suspect he will continue to be high readmission risk if not discharged to SNF. Discharged home previously due to no payor source/medicaid pending.    Left arm weakness -PT/OT   HTN -Continue norvasc, coreg (increased dose 9/30), hydralazine as prescribed at recent discharge   Tobacco use -Cessation counseling, nicotine patch   Adjustment reaction -SSRI started 9/28, will monitor for tolerance, efficacy  AKI -Resolved    Moderate protein calorie malnutrition -RD consulted, supp protein.    DVT prophylaxis: Lovenox   Code Status: DNR Family Communication: No family at bedside Disposition Plan:  Status is: Inpatient Remains inpatient appropriate because: Further antibiotic care with cultures pending  Antimicrobials:  Anti-infectives (From admission, onward)    Start     Dose/Rate Route Frequency Ordered Stop   06/02/22 1500  ceFAZolin (ANCEF) IVPB 1 g/50 mL premix        1 g 100 mL/hr over 30 Minutes Intravenous Every 8 hours 06/02/22 1315     05/31/22 1100  ceFAZolin 1 g / gentamicin 80 mg in NS 500 mL surgical irrigation  Status:  Discontinued          As needed 05/31/22 1146 05/31/22 1146   05/28/22 0900  vancomycin (VANCOREADY) IVPB 1250 mg/250 mL  Status:  Discontinued        1,250 mg 166.7 mL/hr over 90 Minutes Intravenous Every 24 hours 05/27/22 0747 05/29/22 0701   05/27/22 0900  vancomycin (VANCOREADY) IVPB 1500 mg/300 mL        1,500 mg  150 mL/hr over 120 Minutes Intravenous  Once 05/27/22 0747 05/27/22 1305   05/27/22 0900  ceFEPIme (MAXIPIME) 2 g in sodium chloride 0.9 % 100 mL IVPB  Status:  Discontinued        2 g 200 mL/hr over 30 Minutes Intravenous Every 8 hours 05/27/22 0747 06/02/22 1315   05/27/22 0215  cefTRIAXone (ROCEPHIN) 1 g in sodium chloride 0.9 % 100 mL IVPB        1 g 200 mL/hr over 30 Minutes  Intravenous  Once 05/27/22 0208 05/27/22 0241        Objective: Vitals:   06/01/22 2355 06/02/22 0407 06/02/22 0742 06/02/22 1158  BP: (!) 144/69 (!) 143/88 131/73 107/83  Pulse: 88 99 92 93  Resp: 18 18 17 17   Temp: 98.3 F (36.8 C) 98.5 F (36.9 C) 98.1 F (36.7 C) 98.1 F (36.7 C)  TempSrc: Oral Oral Oral Oral  SpO2: 100% 99% 98% 97%  Weight:      Height:        Intake/Output Summary (Last 24 hours) at 06/02/2022 1456 Last data filed at 06/02/2022 0753 Gross per 24 hour  Intake --  Output 1275 ml  Net -1275 ml   Filed Weights   05/27/22 1316 05/28/22 1434 05/31/22 0914  Weight: 65.8 kg 48.4 kg 48.4 kg    Examination:  General exam: Appears calm and comfortable  Respiratory system: Clear to auscultation. Respiratory effort normal. No respiratory distress. No conversational dyspnea.  Cardiovascular system: S1 & S2 heard, RRR. No murmurs. No pedal edema. Gastrointestinal system: Abdomen is nondistended, soft and nontender. Normal bowel sounds heard. Central nervous system: Alert and oriented. No focal neurological deficits. Speech clear.  Extremities: Bilateral AKA, right AKA with wound vac in place  Psychiatry: Flat affect, depressed mood   Data Reviewed: I have personally reviewed following labs and imaging studies  CBC: Recent Labs  Lab 05/26/22 2345 05/28/22 0817 05/29/22 0133 05/31/22 0159 06/02/22 0109  WBC 13.9* 12.0* 11.4* 7.8 8.8  NEUTROABS 10.3* 9.3*  --   --   --   HGB 7.1* 12.1* 12.2* 11.5* 10.6*  HCT 22.5* 35.5* 36.3* 35.2* 33.3*  MCV 90.4 84.5 84.0 86.7 87.6  PLT 299 296 314 271 [id number]   Basic Metabolic Panel: Recent Labs  Lab 05/26/22 2345 05/28/22 0817 05/29/22 0133 05/31/22 0159 06/02/22 0109  NA 135 138 138 138 138  K 3.5 3.4* 3.6 3.7 4.0  CL 103 105 105 107 105  CO2 23 24 20* 22 24  GLUCOSE 101* 109* 113* 105* 103*  BUN 12 13 11 16 17   CREATININE 1.22 0.97 0.88 0.99 0.83  CALCIUM 8.2* 8.8* 9.0 8.9 8.8*   GFR: Estimated  Creatinine Clearance: 72.9 mL/min (by C-G formula based on SCr of 0.83 mg/dL). Liver Function Tests: Recent Labs  Lab 05/26/22 2345  AST 19  ALT 22  ALKPHOS 110  BILITOT 0.6  PROT 6.0*  ALBUMIN 2.3*   No results for input(s): "LIPASE", "AMYLASE" in the last 168 hours. No results for input(s): "AMMONIA" in the last 168 hours. Coagulation Profile: Recent Labs  Lab 05/26/22 2345  INR 1.1   Cardiac Enzymes: No results for input(s): "CKTOTAL", "CKMB", "CKMBINDEX", "TROPONINI" in the last 168 hours. BNP (last 3 results) No results for input(s): "PROBNP" in the last 8760 hours. HbA1C: No results for input(s): "HGBA1C" in the last 72 hours. CBG: Recent Labs  Lab 05/28/22 2136 05/29/22 0625 05/31/22 1126  GLUCAP 109* 104* 103*   Lipid Profile:  No results for input(s): "CHOL", "HDL", "LDLCALC", "TRIG", "CHOLHDL", "LDLDIRECT" in the last 72 hours. Thyroid Function Tests: No results for input(s): "TSH", "T4TOTAL", "FREET4", "T3FREE", "THYROIDAB" in the last 72 hours. Anemia Panel: No results for input(s): "VITAMINB12", "FOLATE", "FERRITIN", "TIBC", "IRON", "RETICCTPCT" in the last 72 hours. Sepsis Labs: Recent Labs  Lab 05/26/22 2345 05/27/22 1812  PROCALCITON  --  3.40  LATICACIDVEN 0.8  --     Recent Results (from the past 240 hour(s))  Blood Culture (routine x 2)     Status: None   Collection Time: 05/26/22 11:45 PM   Specimen: BLOOD LEFT FOREARM  Result Value Ref Range Status   Specimen Description BLOOD LEFT FOREARM  Final   Special Requests   Final    BOTTLES DRAWN AEROBIC AND ANAEROBIC Blood Culture adequate volume   Culture   Final    NO GROWTH 5 DAYS Performed at [facility], [address] [place], [place] [address]    Report Status 06/01/2022 FINAL  Final  Blood Culture (routine x 2)     Status: Abnormal   Collection Time: 05/26/22 11:45 PM   Specimen: BLOOD  Result Value Ref Range Status   Specimen Description BLOOD RIGHT ANTECUBITAL  Final    Special Requests   Final    BOTTLES DRAWN AEROBIC AND ANAEROBIC Blood Culture results may not be optimal due to an inadequate volume of blood received in culture bottles   Culture  Setup Time   Final    GRAM POSITIVE RODS ANAEROBIC BOTTLE ONLY CRITICAL RESULT CALLED TO, READ BACK BY AND VERIFIED WITH: PHARMD JESSICA  C. [id number] FCP    Culture (A)  Final    PROPIONIBACTERIUM ACNES Standardized susceptibility testing for this organism is not available. Performed at [facility], [address] [place], [place] [address]    Report Status 06/02/2022 FINAL  Final  Resp Panel by RT-PCR (Flu A&B, Covid)     Status: None   Collection Time: 05/26/22 11:45 PM   Specimen: Nasal Swab  Result Value Ref Range Status   SARS Coronavirus 2 by RT PCR NEGATIVE NEGATIVE Final    Comment: (NOTE) SARS-CoV-2 target nucleic acids are NOT DETECTED.  The SARS-CoV-2 RNA is generally detectable in upper respiratory specimens during the acute phase of infection. The lowest concentration of SARS-CoV-2 viral copies this assay can detect is 138 copies/mL. A negative result does not preclude SARS-Cov-2 infection and should not be used as the sole basis for treatment or other patient management decisions. A negative result may occur with  improper specimen collection/handling, submission of specimen other than nasopharyngeal swab, presence of viral mutation(s) within the areas targeted by this assay, and inadequate number of viral copies(<138 copies/mL). A negative result must be combined with clinical observations, patient history, and epidemiological information. The expected result is Negative.  Fact Sheet for Patients:  BloggerCourse.com  Fact Sheet for Healthcare Providers:  SeriousBroker.it  This test is no t yet approved or cleared by the [place] FDA and  has been authorized for detection and/or diagnosis of SARS-CoV-2 by FDA under an  Emergency Use Authorization (EUA). This EUA will remain  in effect (meaning this test can be used) for the duration of the COVID-19 declaration under Section 564(b)(1) of the Act, 21 U.S.C.section 360bbb-3(b)(1), unless the authorization is terminated  or revoked sooner.       Influenza A by PCR NEGATIVE NEGATIVE Final   Influenza B by PCR NEGATIVE NEGATIVE Final  Comment: (NOTE) The Xpert Xpress SARS-CoV-2/FLU/RSV plus assay is intended as an aid in the diagnosis of influenza from Nasopharyngeal swab specimens and should not be used as a sole basis for treatment. Nasal washings and aspirates are unacceptable for Xpert Xpress SARS-CoV-2/FLU/RSV testing.  Fact Sheet for Patients: EntrepreneurPulse.com.au  Fact Sheet for Healthcare Providers: IncredibleEmployment.be  This test is not yet approved or cleared by the [place] FDA and has been authorized for detection and/or diagnosis of SARS-CoV-2 by FDA under an Emergency Use Authorization (EUA). This EUA will remain in effect (meaning this test can be used) for the duration of the COVID-19 declaration under Section 564(b)(1) of the Act, 21 U.S.C. section 360bbb-3(b)(1), unless the authorization is terminated or revoked.  Performed at [facility], [place] [address] [place], [place] [address]   Urine Culture     Status: Abnormal   Collection Time: 05/27/22  1:20 AM   Specimen: In/Out Cath Urine  Result Value Ref Range Status   Specimen Description IN/OUT CATH URINE  Final   Special Requests   Final    NONE Performed at [facility], [place] [address], [place], [place] [address]    Culture (A)  Final    >=100,000 COLONIES/mL ESCHERICHIA COLI >=100,000 COLONIES/mL KLEBSIELLA OXYTOCA    Report Status 05/29/2022 FINAL  Final   Organism ID, Bacteria ESCHERICHIA COLI (A)  Final   Organism ID, Bacteria KLEBSIELLA OXYTOCA (A)  Final      Susceptibility   Escherichia coli - MIC*     AMPICILLIN <=2 SENSITIVE Sensitive     CEFAZOLIN <=4 SENSITIVE Sensitive     CEFEPIME <=0.12 SENSITIVE Sensitive     CEFTRIAXONE <=0.25 SENSITIVE Sensitive     CIPROFLOXACIN <=0.25 SENSITIVE Sensitive     GENTAMICIN <=1 SENSITIVE Sensitive     IMIPENEM <=0.25 SENSITIVE Sensitive     NITROFURANTOIN <=16 SENSITIVE Sensitive     TRIMETH/SULFA <=20 SENSITIVE Sensitive     AMPICILLIN/SULBACTAM <=2 SENSITIVE Sensitive     PIP/TAZO <=4 SENSITIVE Sensitive     * >=100,000 COLONIES/mL ESCHERICHIA COLI   Klebsiella oxytoca - MIC*    AMPICILLIN >=32 RESISTANT Resistant     CEFAZOLIN 8 SENSITIVE Sensitive     CEFEPIME <=0.12 SENSITIVE Sensitive     CEFTRIAXONE <=0.25 SENSITIVE Sensitive     CIPROFLOXACIN <=0.25 SENSITIVE Sensitive     GENTAMICIN <=1 SENSITIVE Sensitive     IMIPENEM <=0.25 SENSITIVE Sensitive     NITROFURANTOIN 32 SENSITIVE Sensitive     TRIMETH/SULFA <=20 SENSITIVE Sensitive     AMPICILLIN/SULBACTAM 16 INTERMEDIATE Intermediate     PIP/TAZO <=4 SENSITIVE Sensitive     * >=100,000 COLONIES/mL KLEBSIELLA OXYTOCA  Aerobic/Anaerobic Culture w Gram Stain (surgical/deep wound)     Status: None (Preliminary result)   Collection Time: 05/31/22 11:30 AM   Specimen: Leg, Right; Wound  Result Value Ref Range Status   Specimen Description WOUND  Final   Special Requests GROIN  Final   Gram Stain   Final    ABUNDANT WBC PRESENT, PREDOMINANTLY PMN NO ORGANISMS SEEN Performed at [facility], [address] [place], [place] [address]    Culture   Final    RARE STAPHYLOCOCCUS AUREUS RARE ESCHERICHIA COLI NO ANAEROBES ISOLATED; CULTURE IN PROGRESS FOR 5 DAYS    Report Status PENDING  Incomplete   Organism ID, Bacteria STAPHYLOCOCCUS AUREUS  Final   Organism ID, Bacteria ESCHERICHIA COLI  Final      Susceptibility   Escherichia  coli - MIC*    AMPICILLIN <=2 SENSITIVE Sensitive     CEFAZOLIN <=4 SENSITIVE Sensitive     CEFEPIME <=0.12 SENSITIVE Sensitive     CEFTAZIDIME  <=1 SENSITIVE Sensitive     CEFTRIAXONE <=0.25 SENSITIVE Sensitive     CIPROFLOXACIN <=0.25 SENSITIVE Sensitive     GENTAMICIN <=1 SENSITIVE Sensitive     IMIPENEM <=0.25 SENSITIVE Sensitive     TRIMETH/SULFA <=20 SENSITIVE Sensitive     AMPICILLIN/SULBACTAM <=2 SENSITIVE Sensitive     PIP/TAZO <=4 SENSITIVE Sensitive     * RARE ESCHERICHIA COLI   Staphylococcus aureus - MIC*    CIPROFLOXACIN <=0.5 SENSITIVE Sensitive     ERYTHROMYCIN <=0.25 SENSITIVE Sensitive     GENTAMICIN <=0.5 SENSITIVE Sensitive     OXACILLIN 0.5 SENSITIVE Sensitive     TETRACYCLINE <=1 SENSITIVE Sensitive     VANCOMYCIN <=0.5 SENSITIVE Sensitive     TRIMETH/SULFA <=10 SENSITIVE Sensitive     CLINDAMYCIN <=0.25 SENSITIVE Sensitive     RIFAMPIN <=0.5 SENSITIVE Sensitive     Inducible Clindamycin NEGATIVE Sensitive     * RARE STAPHYLOCOCCUS AUREUS      Radiology Studies: No results found.    Scheduled Meds:  sodium chloride   Intravenous Once   amLODipine  10 mg Oral Daily   aspirin EC  81 mg Oral Daily   atorvastatin  80 mg Oral Daily   carvedilol  12.5 mg Oral BID WC   docusate sodium  100 mg Oral BID   feeding supplement  237 mL Oral TID BM   hydrALAZINE  25 mg Oral BID   multivitamin with minerals  1 tablet Oral Daily   nicotine  21 mg Transdermal Daily   sertraline  25 mg Oral Daily   sodium chloride flush  3 mL Intravenous Q12H   Continuous Infusions:   ceFAZolin (ANCEF) IV       LOS: 6 days     Dessa Phi, DO [facility] 06/02/2022, 2:56 PM   Available via Epic secure chat 7am-7pm After these hours, please refer to coverage provider listed on [email or web address]

## 2022-06-02 NOTE — Progress Notes (Addendum)
Pt refused wet to dry dressing on the wound on L inner thigh. Stated that "it is fine". Education about the important of changing dressing given to pt. Pt only allowed RN to put dry gauze on the wound.   Lavenia Atlas, RN

## 2022-06-02 NOTE — Progress Notes (Addendum)
  Progress Note    06/02/2022 7:52 AM 2 Days Post-Op  Subjective:  no complaints this morning.  Soiled the bed   Vitals:   06/02/22 0407 06/02/22 0742  BP: (!) 143/88 131/73  Pulse: 99 92  Resp: 18 17  Temp: 98.5 F (36.9 C) 98.1 F (36.7 C)  SpO2: 99% 98%   Physical Exam: Lungs:  non labored Incisions:  R groin and AKA vac with good seal Extremities:  L AKA well healed Neurologic: A&O  CBC    Component Value Date/Time   WBC 8.8 06/02/2022 0109   RBC 3.80 (L) 06/02/2022 0109   HGB 10.6 (L) 06/02/2022 0109   HCT 33.3 (L) 06/02/2022 0109   PLT 222 06/02/2022 0109   MCV 87.6 06/02/2022 0109   MCH 27.9 06/02/2022 0109   MCHC 31.8 06/02/2022 0109   RDW 16.0 (H) 06/02/2022 0109   LYMPHSABS 1.9 05/28/2022 0817   MONOABS 0.8 05/28/2022 0817   EOSABS 0.0 05/28/2022 0817   BASOSABS 0.0 05/28/2022 0817    BMET    Component Value Date/Time   NA 138 06/02/2022 0109   K 4.0 06/02/2022 0109   CL 105 06/02/2022 0109   CO2 24 06/02/2022 0109   GLUCOSE 103 (H) 06/02/2022 0109   BUN 17 06/02/2022 0109   CREATININE 0.83 06/02/2022 0109   CALCIUM 8.8 (L) 06/02/2022 0109   GFRNONAA >60 06/02/2022 0109   GFRAA >60 01/12/2020 1421    INR    Component Value Date/Time   INR 1.1 05/26/2022 2345     Intake/Output Summary (Last 24 hours) at 06/02/2022 0752 Last data filed at 06/02/2022 0408 Gross per 24 hour  Intake --  Output 1475 ml  Net -1475 ml     Assessment/Plan:  51 y.o. male is s/p R AKA I&D 2 Days Post-Op   R groin and AKA vac with good seal.  R AKA growing s.aureus and gram neg rods.  Will change both vacs this morning.  Will add vancomycin back after discussion with pharmacy.  TOC working on placement   Dagoberto Ligas, PA-C [facility] [phone] 06/02/2022 7:52 AM   I have seen and evaluated the patient. I agree with the PA note as documented above.  Postop day 2 status post debridement and VAC placement in his right AKA.   Cultures growing Staph aureus and E. coli.  Have discussed with pharmacy adding vancomycin back pending finalization of cultures.  He also has 1 of 2 blood cultures from admission with gram-positive rods and suspect this is a contaminant but we will see what grows (other culture final negative).  We will plan right groin VAC change and right AKA VAC change today.  Groin has looked excellent with sartorius muscle flap.  Marty Heck, MD [facility] [place] Office: [phone]

## 2022-06-02 NOTE — Progress Notes (Signed)
Physical Therapy Treatment Patient Details Name: Jeffrey Fitzgerald MRN: [id number] DOB: 1971-07-07 Today's Date: 06/02/2022   History of Present Illness Sie Siefring is a 51 y.o. male admitted 9/27 who presented to the ED 9/27 feeling poorly with poor per oral intake, urinary symptoms, possible hematuria, and fevers documented by family/HH RN. He was found to be febrile with urinalysis suggestive of UTI.  PMH:  HTN, PAD with recent prolonged admission for BLE critical limb ischemia ultimately undergoing bilateral AKA, discharged home with right groin wound vac 9/20    PT Comments    Pt received supine and agreeable to session with good progress. Session focused on transfer training for increased activity tolerance with pt able to complete posterior scoot transfer to recliner with min assist. Pt with better demonstration of head/hips relationship to bring hips back with multimodal cueing with pt able to clear hips and scoot with min guard at start needing min asssit toward end as pt fatigue increasing. Pt agreeable to time up in chair at end of session and verbalizing importance and understanding of education re; continued mobility, nutrition and assist. Pt continues to benefit from skilled PT services to progress toward functional mobility goals.    Recommendations for follow up therapy are one component of a multi-disciplinary discharge planning process, led by the attending physician.  Recommendations may be updated based on patient status, additional functional criteria and insurance authorization.  Follow Up Recommendations  Skilled nursing-short term rehab (<3 hours/day) Can patient physically be transported by private vehicle: No   Assistance Recommended at Discharge Intermittent Supervision/Assistance  Patient can return home with the following Assistance with cooking/housework;Direct supervision/assist for financial management;Assist for transportation;Help with stairs or ramp for  entrance;A little help with walking and/or transfers;A little help with bathing/dressing/bathroom;Direct supervision/assist for medications management   Equipment Recommendations  None recommended by PT    Recommendations for Other Services       Precautions / Restrictions Precautions Precautions: Fall;Other (comment) Precaution Comments: R groin and R AKA wound vac Restrictions Weight Bearing Restrictions: Yes RLE Weight Bearing: Non weight bearing LLE Weight Bearing: Non weight bearing     Mobility  Bed Mobility Overal bed mobility: Needs Assistance Bed Mobility: Supine to Sit, Sit to Supine     Supine to sit: Mod assist     General bed mobility comments: mod asssit to come to long sitting in bed    Transfers Overall transfer level: Needs assistance Equipment used: None Transfers: Bed to chair/wheelchair/BSC         Anterior-Posterior transfers: Min guard, Min assist   General transfer comment: min guard wit step by step cueing for posterior scoot to chair, min assist toward end opf trasnfer with increased pt fatigue, discussed head/hips relationship during with good return with pt able to lean head forward to move hips back    Ambulation/Gait               General Gait Details: unable   Stairs             Wheelchair Mobility    Modified Rankin (Stroke Patients Only)       Balance Overall balance assessment: Needs assistance Sitting-balance support: No upper extremity supported, Bilateral upper extremity supported Sitting balance-Leahy Scale: Poor Sitting balance - Comments: statically upright in bed without back support min guard relies on UE support  Cognition Arousal/Alertness: Awake/alert Behavior During Therapy: Flat affect Overall Cognitive Status: No family/caregiver present to determine baseline cognitive functioning Area of Impairment: Attention, Following commands,  Safety/judgement, Awareness, Problem solving, Memory                   Current Attention Level: Sustained Memory: Decreased short-term memory Following Commands: Follows one step commands consistently, Follows one step commands with increased time Safety/Judgement: Decreased awareness of safety, Decreased awareness of deficits Awareness: Emergent Problem Solving: Requires verbal cues, Slow processing, Difficulty sequencing General Comments: pt requires increased time to follow commands, poor problem sovling and awareness to self care needs. decreased attention and recall to complete tasks during session.        Exercises      General Comments General comments (skin integrity, edema, etc.): VSS on RA, continued to encourage increased nutrition      Pertinent Vitals/Pain Pain Assessment Pain Assessment: Faces Faces Pain Scale: Hurts a little bit Pain Location: generalized Pain Descriptors / Indicators: Grimacing, Sore Pain Intervention(s): Monitored during session, Limited activity within patient's tolerance, Repositioned    Home Living                          Prior Function            PT Goals (current goals can now be found in the care plan section) Acute Rehab PT Goals PT Goal Formulation: With patient Time For Goal Achievement: 06/12/22    Frequency    Min 3X/week      PT Plan      Co-evaluation              AM-PAC PT "6 Clicks" Mobility   Outcome Measure  Help needed turning from your back to your side while in a flat bed without using bedrails?: A Little Help needed moving from lying on your back to sitting on the side of a flat bed without using bedrails?: A Little Help needed moving to and from a bed to a chair (including a wheelchair)?: A Little Help needed standing up from a chair using your arms (e.g., wheelchair or bedside chair)?: Total Help needed to walk in hospital room?: Total Help needed climbing 3-5 steps with a  railing? : Total 6 Click Score: 12    End of Session   Activity Tolerance: Patient tolerated treatment well Patient left: with call bell/phone within reach;in chair Nurse Communication: Mobility status PT Visit Diagnosis: Other abnormalities of gait and mobility (R26.89);Muscle weakness (generalized) (M62.81)     Time: 5361-4431 PT Time Calculation (min) (ACUTE ONLY): 16 min  Charges:  $Therapeutic Activity: 8-22 mins                     Amit Leece R. PTA Acute Rehabilitation Services Office: [phone]    Catalina Antigua 06/02/2022, 12:38 PM

## 2022-06-02 NOTE — Progress Notes (Signed)
Pharmacy Antibiotic Note  Jeffrey Fitzgerald is a 51 y.o. male with sepsis due to UTI and also with R AKA stump infection on cefepime. He is  s/p washout and wound vac placement 10/2. Adding vancomycin today due to culture results -L Groin wound with staph aureus and GNR -SCr ~ 08, afeb, WBC= 8.8   Plan: -Vancomycin 1000mg  IV q24h (AUC= 4784 w/ SCr= 0.8) -Continue cefepime q8h  -Follow culture sensitivites  Height: 5\' 5"  (165.1 cm) Weight: 48.4 kg (106 lb 11.2 oz) IBW/kg (Calculated) : 61.5  Temp (24hrs), Avg:98.3 F (36.8 C), Min:97.6 F (36.4 C), Max:98.7 F (37.1 C)  Recent Labs  Lab 05/26/22 2345 05/28/22 0817 05/29/22 0133 05/31/22 0159 06/02/22 0109  WBC 13.9* 12.0* 11.4* 7.8 8.8  CREATININE 1.22 0.97 0.88 0.99 0.83  LATICACIDVEN 0.8  --   --   --   --      Estimated Creatinine Clearance: 72.9 mL/min (by C-G formula based on SCr of 0.83 mg/dL).    No Known Allergies  Antimicrobials this admission: Cefepime 9/28 >>  Vancomycin 9/28 >> 9/30; 10/4>  Microbiology results: 9/27 BCx: ngtd 9/28 UCx: >100,000 colonies/mL e coli, klebsiella oxytoca 10/2: wound- gnr, staph aureus  Thank you for allowing pharmacy to be a part of this patient's care.  Hildred Laser, PharmD Clinical Pharmacist **Pharmacist phone directory can now be found on [email or web address] ([email or web address]).  Listed under [place].

## 2022-06-03 NOTE — Progress Notes (Signed)
Nutrition Follow-up  DOCUMENTATION CODES:   Non-severe (moderate) malnutrition in context of acute illness/injury  INTERVENTION:  Continue regular diet. Encouraged adequate intake of foods high in calories and protein at meals.  Continue Ensure Enlive/Ensure Plus High Protein po TID, each supplement provides 350 kcal and 20 grams of protein. Patient prefers strawberry or chocolate (when chocolate available).  Provide Magic cup po BID with lunch and dinner, each supplement provides 290 kcal and 9 grams of protein. Patient prefers chocolate.  Continue multivitamin daily.  NUTRITION DIAGNOSIS:   Moderate Malnutrition related to acute illness (PAD with bilateral critical limb ischemia s/p bilateral AKA with prolonged admission) as evidenced by moderate fat depletion, mild muscle depletion.  Ongoing.  GOAL:   Patient will meet greater than or equal to 90% of their needs  Progressing.  MONITOR:   PO intake, Supplement acceptance, Weight trends, Skin, I & O's  REASON FOR ASSESSMENT:   Consult  (Nutrition goals)  ASSESSMENT:   51 year old male with PMHx of HTN, PAD, with recent prolonged admission for bilateral critical limb ischemia ultimately undergoing bilateral AKA and was discharge home on 05/19/22 with wound VAC. Now admitted with sepsis due to UTI, acute blood loss anemia, acute encephalopathy.  -Underwent right AKA on 04/22/22 and left AKA on 04/30/22, both prior to current admission. -05/31/22: right AKA I&D, VAC placement 06/02/22: right AKA VAC change  Met with patient at bedside. He reports his appetite is "okay." He reports he is eating 50% of 2 meals daily. Limited meal documentation data available in chart but ranges between 0-35% for meals documented. He is drinking 2-3 bottles of Ensure Enlive/Ensure Plus High Protein daily. Denies nausea, emesis, or abdominal pain. He reports having 2 bowel movements daily and denies having constipation or diarrhea. Discussed  importance of adequate nutrition. Encouraged intake of foods high in calories and protein at meals. Encouraged patient to drink all 3 bottles of Ensure Enlive/Ensure Plus High Protein daily. He is also amenable to trying Magic Cup with lunch and dinner.  Noted wt taken on 05/31/22 that is also 48.4 kg (106.7 lbs), which is exact same wt that was entered on 05/28/22. Unsure if this is a true measured wt. Continue to monitor wt trends during admission.  Medications reviewed and include: carvedilol, Colace, Ensure Enlive, MVI, nicotine patch, cefazolin.  Labs reviewed.  UOP: 1025 mL (0.9 mL/kg/hr)  I/O: -2480 mL since admission  Diet Order:   Diet Order             Diet regular Room service appropriate? No; Fluid consistency: Thin  Diet effective now                   EDUCATION NEEDS:   No education needs have been identified at this time  Skin:  Skin Assessment: Skin Integrity Issues: Skin Integrity Issues:: Wound VAC, Other (Comment), Stage II Stage II: mid coccyx Wound Vac: right leg Other: non pressure wound to left thigh  Last BM:  06/03/22 large type 5  Height:   Ht Readings from Last 1 Encounters:  05/31/22 _0  (1.651 m)    Weight:   Wt Readings from Last 1 Encounters:  05/31/22 48.4 kg    BMI:  Body mass index is 17.76 kg/m.  Estimated Nutritional Needs:   Kcal:  1700-1900  Protein:  85-95 grams  Fluid:  1.7-1.9 L/day  Loanne Drilling, MS, RD, LDN, CNSC Pager number available on Amion

## 2022-06-03 NOTE — Plan of Care (Signed)
  Problem: Education: Goal: Knowledge of General Education information will improve Description Including pain rating scale, medication(s)/side effects and non-pharmacologic comfort measures Outcome: Progressing   

## 2022-06-03 NOTE — Progress Notes (Addendum)
  Progress Note    06/03/2022 7:56 AM 3 Days Post-Op  Subjective:  no complaints   Vitals:   06/03/22 0026 06/03/22 0413  BP: (!) 152/78 (!) 167/88  Pulse: 96 100  Resp: 17 18  Temp: 98.4 F (36.9 C) 98 F (36.7 C)  SpO2: 100% 99%   Physical Exam: Lungs:  non labored Incisions:  R groin and R AKA vac with good seal Neurologic: A&O  CBC    Component Value Date/Time   WBC 8.8 06/02/2022 0109   RBC 3.80 (L) 06/02/2022 0109   HGB 10.6 (L) 06/02/2022 0109   HCT 33.3 (L) 06/02/2022 0109   PLT 222 06/02/2022 0109   MCV 87.6 06/02/2022 0109   MCH 27.9 06/02/2022 0109   MCHC 31.8 06/02/2022 0109   RDW 16.0 (H) 06/02/2022 0109   LYMPHSABS 1.9 05/28/2022 0817   MONOABS 0.8 05/28/2022 0817   EOSABS 0.0 05/28/2022 0817   BASOSABS 0.0 05/28/2022 0817    BMET    Component Value Date/Time   NA 138 06/02/2022 0109   K 4.0 06/02/2022 0109   CL 105 06/02/2022 0109   CO2 24 06/02/2022 0109   GLUCOSE 103 (H) 06/02/2022 0109   BUN 17 06/02/2022 0109   CREATININE 0.83 06/02/2022 0109   CALCIUM 8.8 (L) 06/02/2022 0109   GFRNONAA >60 06/02/2022 0109   GFRAA >60 01/12/2020 1421    INR    Component Value Date/Time   INR 1.1 05/26/2022 2345     Intake/Output Summary (Last 24 hours) at 06/03/2022 0756 Last data filed at 06/03/2022 0414 Gross per 24 hour  Intake 200 ml  Output 730 ml  Net -530 ml     Assessment/Plan:  51 y.o. male is s/p R AKA I&D 3 Days Post-Op   R groin and AKA vac with good seal; these were changed yesterday.  We will change again tomorrow.  Continue ancef based on culture data.  [place] for discharge from vascular standpoint.   Dagoberto Ligas, PA-C [facility] [phone] 06/03/2022 7:56 AM  I have seen and evaluated the patient. I agree with the PA note as documented above.   Marty Heck, MD [facility] [place] Office: [phone]

## 2022-06-03 NOTE — Progress Notes (Signed)
Occupational Therapy Treatment Patient Details Name: Jeffrey Fitzgerald MRN: [id number] DOB: [birth year] Today's Date: 06/03/2022   History of present illness Jeffrey Fitzgerald is a 51 y.o. male admitted 9/27 who presented to the ED 9/27 feeling poorly with poor per oral intake, urinary symptoms, possible hematuria, and fevers documented by family/HH RN. He was found to be febrile with urinalysis suggestive of UTI.  PMH:  HTN, PAD with recent prolonged admission for BLE critical limb ischemia ultimately undergoing bilateral AKA, discharged home with right groin wound vac 9/20   OT comments  Patient received in bed and declining to get OOB into recliner but agreeable to long sitting in bed and performing UE HEP with red therapy band. Patient required verbal cues to perform exercises. Patient demonstrated decreased grip on LUE and was provided squeeze ball and instructions for use. Patient performed UB bathing with washing arms and applying lotion while in long sitting. Discharge recommendations continue to be appropriate. Acute OT to continue to follow.    Recommendations for follow up therapy are one component of a multi-disciplinary discharge planning process, led by the attending physician.  Recommendations may be updated based on patient status, additional functional criteria and insurance authorization.    Follow Up Recommendations  Skilled nursing-short term rehab (<3 hours/day)    Assistance Recommended at Discharge Frequent or constant Supervision/Assistance  Patient can return home with the following  A lot of help with walking and/or transfers;A lot of help with bathing/dressing/bathroom;Assistance with cooking/housework;Direct supervision/assist for medications management;Direct supervision/assist for financial management;Assist for transportation;Help with stairs or ramp for entrance   Equipment Recommendations  None recommended by OT    Recommendations for Other Services       Precautions / Restrictions Precautions Precautions: Fall;Other (comment) Precaution Comments: R groin and R AKA wound vac Restrictions Weight Bearing Restrictions: Yes RLE Weight Bearing: Non weight bearing LLE Weight Bearing: Non weight bearing       Mobility Bed Mobility Overal bed mobility: Needs Assistance Bed Mobility: Supine to Sit, Sit to Supine     Supine to sit: Min assist Sit to supine: Min assist   General bed mobility comments: min assist to get to long sitting in bed    Transfers Overall transfer level: Needs assistance                 General transfer comment: declined getting out of bed     Balance Overall balance assessment: Needs assistance Sitting-balance support: No upper extremity supported, Bilateral upper extremity supported Sitting balance-Leahy Scale: Poor Sitting balance - Comments: min guard during tasks in long sitting                                   ADL either performed or assessed with clinical judgement   ADL Overall ADL's : Needs assistance/impaired         Upper Body Bathing: Set up;Sitting Upper Body Bathing Details (indicate cue type and reason): bathed arms and applied lotion while long sitting in bed                           General ADL Comments: declined grooming but performed UB bathing in long sitting    Extremity/Trunk Assessment              Vision       Perception     Praxis  Cognition Arousal/Alertness: Awake/alert Behavior During Therapy: Flat affect Overall Cognitive Status: No family/caregiver present to determine baseline cognitive functioning Area of Impairment: Attention, Following commands, Safety/judgement, Awareness, Problem solving, Memory                   Current Attention Level: Sustained Memory: Decreased short-term memory Following Commands: Follows one step commands consistently, Follows one step commands with increased  time Safety/Judgement: Decreased awareness of safety, Decreased awareness of deficits Awareness: Emergent Problem Solving: Requires verbal cues, Slow processing, Difficulty sequencing General Comments: very flat affect. declined getting out of bed        Exercises Exercises: General Upper Extremity General Exercises - Upper Extremity Shoulder Flexion: Strengthening, Both, 20 reps, Seated, Theraband Theraband Level (Shoulder Flexion): Level 2 (Red) Shoulder ABduction: Strengthening, Both, 20 reps, Seated, Theraband Theraband Level (Shoulder Abduction): Level 2 (Red) Elbow Flexion: Strengthening, Both, 20 reps, Seated, Theraband Theraband Level (Elbow Flexion): Level 2 (Red) Elbow Extension: Strengthening, 20 reps, Seated, Both, Theraband Theraband Level (Elbow Extension): Level 2 (Red)    Shoulder Instructions       General Comments      Pertinent Vitals/ Pain       Pain Assessment Pain Assessment: Faces Faces Pain Scale: Hurts a little bit Pain Location: generalized Pain Descriptors / Indicators: Grimacing, Sore Pain Intervention(s): Limited activity within patient's tolerance, Monitored during session, Repositioned  Home Living                                          Prior Functioning/Environment              Frequency  Min 2X/week        Progress Toward Goals  OT Goals(current goals can now be found in the care plan section)  Progress towards OT goals: Progressing toward goals  Acute Rehab OT Goals Patient Stated Goal: get better OT Goal Formulation: With patient Time For Goal Achievement: 06/12/22 Potential to Achieve Goals: Fair ADL Goals Pt Will Perform Grooming: Independently;sitting Pt Will Perform Lower Body Bathing: bed level;sitting/lateral leans;with min guard assist Pt Will Perform Lower Body Dressing: with supervision;sitting/lateral leans Pt Will Transfer to Toilet: with min guard assist;bedside  commode;anterior/posterior transfer Pt Will Perform Toileting - Clothing Manipulation and hygiene: with supervision;sitting/lateral leans Pt/caregiver will Perform Home Exercise Program: Increased strength;Both right and left upper extremity;With written HEP provided;With Supervision Additional ADL Goal #1: Pt will complete bed mobility with supervision A as a precursor to sitting EOB ADLs  Plan Discharge plan remains appropriate;Frequency needs to be updated    Co-evaluation                 AM-PAC OT "6 Clicks" Daily Activity     Outcome Measure   Help from another person eating meals?: None Help from another person taking care of personal grooming?: A Little Help from another person toileting, which includes using toliet, bedpan, or urinal?: A Lot Help from another person bathing (including washing, rinsing, drying)?: A Little Help from another person to put on and taking off regular upper body clothing?: A Little Help from another person to put on and taking off regular lower body clothing?: A Little 6 Click Score: 18    End of Session Equipment Utilized During Treatment: Other (comment) (therapy band)  OT Visit Diagnosis: Other abnormalities of gait and mobility (R26.89);Muscle weakness (generalized) (M62.81)   Activity Tolerance Patient  tolerated treatment well   Patient Left in bed;with call bell/phone within reach;with bed alarm set   Nurse Communication Mobility status        Time: [id number] OT Time Calculation (min): 19 min  Charges: OT General Charges $OT Visit: 1 Visit OT Treatments $Therapeutic Exercise: 8-22 mins  .[email or web address]  Trixie Dredge 06/03/2022, 12:41 PM

## 2022-06-03 NOTE — Progress Notes (Signed)
PROGRESS NOTE    Jeffrey Fitzgerald  TDH:[id number] DOB: 1970-12-27 DOA: 05/26/2022 PCP: Patient, No Pcp Per     Brief Narrative:  Jeffrey Fitzgerald is a 51 y.o. male with medical history significant of HTN, PAD with recent prolonged admission for BLE critical limb ischemia ultimately undergoing bilateral AKA, discharged home with wound vac 9/20 who presented to the ED 9/27 feeling poorly with poor oral intake, urinary symptoms, possible hematuria, and fevers documented by family/HH RN. He was found to be febrile with urinalysis suggestive of UTI. Also changes on CT around the aortic graft for which vascular surgery was consulted, feeling this is expected appearance postoperatively. No graft infection currently noted. Vascular surgery considering right AKA site washout. Hemodynamically patient is stabilizing on IV antibiotics.    10/2: Right AKA site washout, wound vac 10/4: Right AKA vac change   New events last 24 hours / Subjective: No new issues overnight   Assessment & Plan:  Principal Problem:   Sepsis secondary to UTI [facility]) Active Problems:   Hypertension   Critical limb ischemia of both lower extremities (HCC)   PAD (peripheral artery disease) (HCC)   Acute postoperative anemia due to expected blood loss   Muscle weakness of left upper extremity   Tobacco dependence   Adjustment reaction   DNR (do not resuscitate)   Malnutrition of moderate degree   Sepsis due to UTI -With leukocytosis, tachycardia, tachypnea, encephalopathy. Leukocytosis resolved as of 10/2.  -Urine culture with E. coli, Klebsiella. Improved with cefepime.    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 -S/p washout and wound vac placement 10/2 -Wound culture with staph aureus, e coli, pansensitive.  -Continue ancef --> keflex for discharge    ABLA -S/p 2u PRBCs   -Stable  Acute metabolic encephalopathy -Resolved    Failure to thrive, debility s/p bilateral AKA -Strongly suspect he will continue to be high readmission risk if not discharged to SNF. Discharged home previously due to no payor source/medicaid pending. Unfortunately no option for SNF at this time. Sister to take him home, home health ordered    Left arm weakness -Continue PT/OT   HTN -Continue norvasc, coreg (increased dose 9/30), hydralazine as prescribed at recent discharge   Tobacco use -Cessation counseling, nicotine patch   Adjustment reaction -SSRI started 9/28, will monitor for tolerance, efficacy  AKI -Resolved    Moderate protein calorie malnutrition -RD consulted, supp protein.    DVT prophylaxis: enoxaparin (LOVENOX) injection 40 mg Start: 06/02/22 1600  Code Status: DNR Family Communication: No family at bedside Disposition Plan:  Status is: Inpatient Remains inpatient appropriate because: Wound vac change in AM then discharge home   Antimicrobials:  Anti-infectives (From admission, onward)    Start     Dose/Rate Route Frequency Ordered Stop   06/02/22 1500  ceFAZolin (ANCEF) IVPB 1 g/50 mL premix        1 g 100 mL/hr over 30 Minutes Intravenous Every 8 hours 06/02/22 1315     05/31/22 1100  ceFAZolin 1 g / gentamicin 80 mg in NS 500 mL surgical irrigation  Status:  Discontinued          As needed 05/31/22 1146 05/31/22 1146   05/28/22 0900  vancomycin (VANCOREADY) IVPB 1250 mg/250 mL  Status:  Discontinued        1,250 mg 166.7 mL/hr over 90 Minutes Intravenous Every 24 hours  05/27/22 0747 05/29/22 0701   05/27/22 0900  vancomycin (VANCOREADY) IVPB 1500 mg/300 mL        1,500 mg 150 mL/hr over 120 Minutes Intravenous  Once 05/27/22 0747 05/27/22 1305   05/27/22 0900  ceFEPIme (MAXIPIME) 2 g in sodium chloride 0.9 % 100 mL IVPB  Status:  Discontinued        2 g 200 mL/hr over 30 Minutes Intravenous Every 8 hours 05/27/22 0747 06/02/22 1315    05/27/22 0215  cefTRIAXone (ROCEPHIN) 1 g in sodium chloride 0.9 % 100 mL IVPB        1 g 200 mL/hr over 30 Minutes Intravenous  Once 05/27/22 0208 05/27/22 0241        Objective: Vitals:   06/03/22 0026 06/03/22 0413 06/03/22 0908 06/03/22 1145  BP: (!) 152/78 (!) 167/88 (!) 155/76 128/76  Pulse: 96 100 98 90  Resp: 17 18 19 20   Temp: 98.4 F (36.9 C) 98 F (36.7 C) 98.2 F (36.8 C) (!) 97 F (36.1 C)  TempSrc: Oral Oral Oral Axillary  SpO2: 100% 99% 98% 97%  Weight:      Height:        Intake/Output Summary (Last 24 hours) at 06/03/2022 1301 Last data filed at 06/03/2022 1027 Gross per 24 hour  Intake 250 ml  Output 1230 ml  Net -980 ml    Filed Weights   05/27/22 1316 05/28/22 1434 05/31/22 0914  Weight: 65.8 kg 48.4 kg 48.4 kg    Examination:  General exam: Appears calm and comfortable  Respiratory system: Clear to auscultation. Respiratory effort normal. No respiratory distress. No conversational dyspnea.  Cardiovascular system: S1 & S2 heard, RRR. No murmurs. No pedal edema. Gastrointestinal system: Abdomen is nondistended, soft and nontender. Normal bowel sounds heard. Central nervous system: Alert and oriented. No focal neurological deficits. Speech clear.  Extremities: Bilateral AKA, right AKA with wound vac in place  Psychiatry: Flat affect, depressed mood   Data Reviewed: I have personally reviewed following labs and imaging studies  CBC: Recent Labs  Lab 05/28/22 0817 05/29/22 0133 05/31/22 0159 06/02/22 0109  WBC 12.0* 11.4* 7.8 8.8  NEUTROABS 9.3*  --   --   --   HGB 12.1* 12.2* 11.5* 10.6*  HCT 35.5* 36.3* 35.2* 33.3*  MCV 84.5 84.0 86.7 87.6  PLT 296 314 271 222    Basic Metabolic Panel: Recent Labs  Lab 05/28/22 0817 05/29/22 0133 05/31/22 0159 06/02/22 0109  NA 138 138 138 138  K 3.4* 3.6 3.7 4.0  CL 105 105 107 105  CO2 24 20* 22 24  GLUCOSE 109* 113* 105* 103*  BUN 13 11 16 17   CREATININE 0.97 0.88 0.99 0.83  CALCIUM 8.8*  9.0 8.9 8.8*    GFR: Estimated Creatinine Clearance: 72.9 mL/min (by C-G formula based on SCr of 0.83 mg/dL). Liver Function Tests: No results for input(s): "AST", "ALT", "ALKPHOS", "BILITOT", "PROT", "ALBUMIN" in the last 168 hours.  No results for input(s): "LIPASE", "AMYLASE" in the last 168 hours. No results for input(s): "AMMONIA" in the last 168 hours. Coagulation Profile: No results for input(s): "INR", "PROTIME" in the last 168 hours.  Cardiac Enzymes: No results for input(s): "CKTOTAL", "CKMB", "CKMBINDEX", "TROPONINI" in the last 168 hours. BNP (last 3 results) No results for input(s): "PROBNP" in the last 8760 hours. HbA1C: No results for input(s): "HGBA1C" in the last 72 hours. CBG: Recent Labs  Lab 05/28/22 2136 05/29/22 0625 05/31/22 1126  GLUCAP 109* 104* 103*  Lipid Profile: No results for input(s): "CHOL", "HDL", "LDLCALC", "TRIG", "CHOLHDL", "LDLDIRECT" in the last 72 hours. Thyroid Function Tests: No results for input(s): "TSH", "T4TOTAL", "FREET4", "T3FREE", "THYROIDAB" in the last 72 hours. Anemia Panel: No results for input(s): "VITAMINB12", "FOLATE", "FERRITIN", "TIBC", "IRON", "RETICCTPCT" in the last 72 hours. Sepsis Labs: Recent Labs  Lab 05/27/22 1812  PROCALCITON 3.40     Recent Results (from the past 240 hour(s))  Blood Culture (routine x 2)     Status: None   Collection Time: 05/26/22 11:45 PM   Specimen: BLOOD LEFT FOREARM  Result Value Ref Range Status   Specimen Description BLOOD LEFT FOREARM  Final   Special Requests   Final    BOTTLES DRAWN AEROBIC AND ANAEROBIC Blood Culture adequate volume   Culture   Final    NO GROWTH 5 DAYS Performed at [facility], [address], [place], [place] [address]    Report Status 06/01/2022 FINAL  Final  Blood Culture (routine x 2)     Status: Abnormal   Collection Time: 05/26/22 11:45 PM   Specimen: BLOOD  Result Value Ref Range Status   Specimen Description BLOOD RIGHT  ANTECUBITAL  Final   Special Requests   Final    BOTTLES DRAWN AEROBIC AND ANAEROBIC Blood Culture results may not be optimal due to an inadequate volume of blood received in culture bottles   Culture  Setup Time   Final    GRAM POSITIVE RODS ANAEROBIC BOTTLE ONLY CRITICAL RESULT CALLED TO, READ BACK BY AND VERIFIED WITH: PHARMD Corcoran  C. 1884 [id number] FCP    Culture (A)  Final    PROPIONIBACTERIUM ACNES Standardized susceptibility testing for this organism is not available. Performed at [facility], [place] [address], [place], [place] [address]    Report Status 06/02/2022 FINAL  Final  Resp Panel by RT-PCR (Flu A&B, Covid)     Status: None   Collection Time: 05/26/22 11:45 PM   Specimen: Nasal Swab  Result Value Ref Range Status   SARS Coronavirus 2 by RT PCR NEGATIVE NEGATIVE Final    Comment: (NOTE) SARS-CoV-2 target nucleic acids are NOT DETECTED.  The SARS-CoV-2 RNA is generally detectable in upper respiratory specimens during the acute phase of infection. The lowest concentration of SARS-CoV-2 viral copies this assay can detect is 138 copies/mL. A negative result does not preclude SARS-Cov-2 infection and should not be used as the sole basis for treatment or other patient management decisions. A negative result may occur with  improper specimen collection/handling, submission of specimen other than nasopharyngeal swab, presence of viral mutation(s) within the areas targeted by this assay, and inadequate number of viral copies(<138 copies/mL). A negative result must be combined with clinical observations, patient history, and epidemiological information. The expected result is Negative.  Fact Sheet for Patients:  EntrepreneurPulse.com.au  Fact Sheet for Healthcare Providers:  IncredibleEmployment.be  This test is no t yet approved or cleared by the [place] FDA and  has been authorized for detection and/or diagnosis of  SARS-CoV-2 by FDA under an Emergency Use Authorization (EUA). This EUA will remain  in effect (meaning this test can be used) for the duration of the COVID-19 declaration under Section 564(b)(1) of the Act, 21 U.S.C.section 360bbb-3(b)(1), unless the authorization is terminated  or revoked sooner.       Influenza A by PCR NEGATIVE NEGATIVE Final   Influenza B by PCR NEGATIVE NEGATIVE Final    Comment: (NOTE) The Xpert Xpress SARS-CoV-2/FLU/RSV  plus assay is intended as an aid in the diagnosis of influenza from Nasopharyngeal swab specimens and should not be used as a sole basis for treatment. Nasal washings and aspirates are unacceptable for Xpert Xpress SARS-CoV-2/FLU/RSV testing.  Fact Sheet for Patients: BloggerCourse.comhttps://www.fda.gov/media/152166/download  Fact Sheet for Healthcare Providers: SeriousBroker.ithttps://www.fda.gov/media/152162/download  This test is not yet approved or cleared by the [place] FDA and has been authorized for detection and/or diagnosis of SARS-CoV-2 by FDA under an Emergency Use Authorization (EUA). This EUA will remain in effect (meaning this test can be used) for the duration of the COVID-19 declaration under Section 564(b)(1) of the Act, 21 U.S.C. section 360bbb-3(b)(1), unless the authorization is terminated or revoked.  Performed at [facility], [address] [place], [place] [address]   Urine Culture     Status: Abnormal   Collection Time: 05/27/22  1:20 AM   Specimen: In/Out Cath Urine  Result Value Ref Range Status   Specimen Description IN/OUT CATH URINE  Final   Special Requests   Final    NONE Performed at [facility], [address] [place], [place] [address]    Culture (A)  Final    >=100,000 COLONIES/mL ESCHERICHIA COLI >=100,000 COLONIES/mL KLEBSIELLA OXYTOCA    Report Status 05/29/2022 FINAL  Final   Organism ID, Bacteria ESCHERICHIA COLI (A)  Final   Organism ID, Bacteria KLEBSIELLA OXYTOCA (A)  Final      Susceptibility    Escherichia coli - MIC*    AMPICILLIN <=2 SENSITIVE Sensitive     CEFAZOLIN <=4 SENSITIVE Sensitive     CEFEPIME <=0.12 SENSITIVE Sensitive     CEFTRIAXONE <=0.25 SENSITIVE Sensitive     CIPROFLOXACIN <=0.25 SENSITIVE Sensitive     GENTAMICIN <=1 SENSITIVE Sensitive     IMIPENEM <=0.25 SENSITIVE Sensitive     NITROFURANTOIN <=16 SENSITIVE Sensitive     TRIMETH/SULFA <=20 SENSITIVE Sensitive     AMPICILLIN/SULBACTAM <=2 SENSITIVE Sensitive     PIP/TAZO <=4 SENSITIVE Sensitive     * >=100,000 COLONIES/mL ESCHERICHIA COLI   Klebsiella oxytoca - MIC*    AMPICILLIN >=32 RESISTANT Resistant     CEFAZOLIN 8 SENSITIVE Sensitive     CEFEPIME <=0.12 SENSITIVE Sensitive     CEFTRIAXONE <=0.25 SENSITIVE Sensitive     CIPROFLOXACIN <=0.25 SENSITIVE Sensitive     GENTAMICIN <=1 SENSITIVE Sensitive     IMIPENEM <=0.25 SENSITIVE Sensitive     NITROFURANTOIN 32 SENSITIVE Sensitive     TRIMETH/SULFA <=20 SENSITIVE Sensitive     AMPICILLIN/SULBACTAM 16 INTERMEDIATE Intermediate     PIP/TAZO <=4 SENSITIVE Sensitive     * >=100,000 COLONIES/mL KLEBSIELLA OXYTOCA  Aerobic/Anaerobic Culture w Gram Stain (surgical/deep wound)     Status: None (Preliminary result)   Collection Time: 05/31/22 11:30 AM   Specimen: Leg, Right; Wound  Result Value Ref Range Status   Specimen Description WOUND  Final   Special Requests GROIN  Final   Gram Stain   Final    ABUNDANT WBC PRESENT, PREDOMINANTLY PMN NO ORGANISMS SEEN Performed at [facility], [address] [place], [place] [address]    Culture   Final    RARE STAPHYLOCOCCUS AUREUS RARE ESCHERICHIA COLI NO ANAEROBES ISOLATED; CULTURE IN PROGRESS FOR 5 DAYS    Report Status PENDING  Incomplete   Organism ID, Bacteria STAPHYLOCOCCUS AUREUS  Final   Organism ID, Bacteria ESCHERICHIA COLI  Final      Susceptibility   Escherichia coli - MIC*  AMPICILLIN <=2 SENSITIVE Sensitive     CEFAZOLIN <=4 SENSITIVE Sensitive     CEFEPIME <=0.12 SENSITIVE  Sensitive     CEFTAZIDIME <=1 SENSITIVE Sensitive     CEFTRIAXONE <=0.25 SENSITIVE Sensitive     CIPROFLOXACIN <=0.25 SENSITIVE Sensitive     GENTAMICIN <=1 SENSITIVE Sensitive     IMIPENEM <=0.25 SENSITIVE Sensitive     TRIMETH/SULFA <=20 SENSITIVE Sensitive     AMPICILLIN/SULBACTAM <=2 SENSITIVE Sensitive     PIP/TAZO <=4 SENSITIVE Sensitive     * RARE ESCHERICHIA COLI   Staphylococcus aureus - MIC*    CIPROFLOXACIN <=0.5 SENSITIVE Sensitive     ERYTHROMYCIN <=0.25 SENSITIVE Sensitive     GENTAMICIN <=0.5 SENSITIVE Sensitive     OXACILLIN 0.5 SENSITIVE Sensitive     TETRACYCLINE <=1 SENSITIVE Sensitive     VANCOMYCIN <=0.5 SENSITIVE Sensitive     TRIMETH/SULFA <=10 SENSITIVE Sensitive     CLINDAMYCIN <=0.25 SENSITIVE Sensitive     RIFAMPIN <=0.5 SENSITIVE Sensitive     Inducible Clindamycin NEGATIVE Sensitive     * RARE STAPHYLOCOCCUS AUREUS      Radiology Studies: No results found.    Scheduled Meds:  amLODipine  10 mg Oral Daily   aspirin EC  81 mg Oral Daily   atorvastatin  80 mg Oral Daily   carvedilol  12.5 mg Oral BID WC   docusate sodium  100 mg Oral BID   enoxaparin (LOVENOX) injection  40 mg Subcutaneous Q24H   feeding supplement  237 mL Oral TID BM   hydrALAZINE  25 mg Oral BID   multivitamin with minerals  1 tablet Oral Daily   nicotine  21 mg Transdermal Daily   sertraline  25 mg Oral Daily   sodium chloride flush  3 mL Intravenous Q12H   Continuous Infusions:   ceFAZolin (ANCEF) IV Stopped (06/03/22 0945)     LOS: 7 days     Noralee Stain, DO [facility] 06/03/2022, 1:01 PM   Available via Epic secure chat 7am-7pm After these hours, please refer to coverage provider listed on [email or web address]

## 2022-06-03 NOTE — TOC Progression Note (Signed)
Transition of Care (TOC) - Progression Note  Marvetta Gibbons RN, BSN Transitions of Care Unit 4E- RN Case Manager See Treatment Team for direct phone #    Patient Details  Name: Jeffrey Fitzgerald MRN: [id number] Date of Birth: 1971/03/11  Transition of Care [facility]) CM/SW Contact  Dahlia Client Romeo Rabon, RN Phone Number: 06/03/2022, 4:05 PM  Clinical Narrative:    Notified by attending that pt medically stable for transition home, pt will need wound VAC drsg changed here tomorrow prior to discharging home. Vascular will plan to do VAC drsg changes in the am prior to transition home.  CM has confirmed with [facility] that they can do a resumption of care for needed [facility] services RN/PT/OT- they will be out on Monday 10/9 to do a VAC drsg change.  Home [facility] VAC at the bedside to transition home with- bedside RN to plug in today and charge.   Call made to sister [name] to update on plan for return home, [name] is agreeable for pt to return home w/ [facility] services and request EMS transport home- pt will need GOLD DNR for transport.   CM will follow up tomorrow for transition home.     Expected Discharge Plan: [place] Barriers to Discharge: Continued Medical Work up  Expected Discharge Plan and Services Expected Discharge Plan: [place]   Discharge Planning Services: CM Consult Post Acute Care Choice: Home Health, Resumption of Svcs/PTA Provider Living arrangements for the past 2 months: Single Family Home                 DME Arranged: Vac DME Agency: [facility] Date DME Agency Contacted: 06/01/22 Time DME Agency Contacted: 1100 Representative spoke with at DME Agency: Olivia Mackie HH Arranged: RN, PT [facility] Agency: [place] ([place]) Date [place]: 06/01/22 Time [place]: 1100 Representative spoke with at [place]: [place] ([place]) Interventions    Readmission Risk Interventions    05/18/2022    4:16  PM  Readmission Risk Prevention Plan  Post Dischage Appt Complete  Medication Screening Complete  Transportation Screening Complete

## 2022-06-03 NOTE — Progress Notes (Signed)
Awaken patient to do dressing changes. Patient refuses dressing changes at this time.

## 2022-06-04 ENCOUNTER — Other Ambulatory Visit (HOSPITAL_COMMUNITY): Payer: Self-pay

## 2022-06-04 MED ORDER — SERTRALINE HCL 25 MG PO TABS
25.0000 mg | ORAL_TABLET | Freq: Every day | ORAL | 1 refills | Status: DC
  Filled 2022-06-04: qty 30, 30d supply, fill #0

## 2022-06-04 MED ORDER — CEPHALEXIN 500 MG PO CAPS
500.0000 mg | ORAL_CAPSULE | Freq: Four times a day (QID) | ORAL | 0 refills | Status: DC
  Filled 2022-06-04: qty 24, 6d supply, fill #0

## 2022-06-04 MED ORDER — CARVEDILOL 12.5 MG PO TABS
12.5000 mg | ORAL_TABLET | Freq: Two times a day (BID) | ORAL | 0 refills | Status: DC
  Filled 2022-06-04: qty 60, 30d supply, fill #0

## 2022-06-04 NOTE — Plan of Care (Signed)
  Problem: Education: Goal: Knowledge of General Education information will improve Description: Including pain rating scale, medication(s)/side effects and non-pharmacologic comfort measures Outcome: Not Progressing   

## 2022-06-04 NOTE — Progress Notes (Signed)
Went over AVS with pt and all questions were addressed. Pt received meds  from TOC. Waiting for transport.   Lavenia Atlas, RN

## 2022-06-04 NOTE — TOC Transition Note (Signed)
Transition of Care (TOC) - CM/SW Discharge Note Marvetta Gibbons RN, BSN Transitions of Care Unit 4E- RN Case Manager See Treatment Team for direct phone #    Patient Details  Name: Jeffrey Fitzgerald MRN: [id number] Date of Birth: 1970-09-07  Transition of Care [facility]) CM/SW Contact:  Dawayne Patricia, RN Phone Number: 06/04/2022, 12:22 PM   Clinical Narrative:    Pt stable for transition home today. Wound VAC drsg have been changed today per vascular. Home VAC at the bedside for transition home.  HH orders have been placed for resumption of services. CM has spoken with Caryl Pina at [facility] and they will plan to see pt on Monday 10/9 to resume home VAC drsg changes.   CM spoke with sister and she is ready for pt to return home,  [facility] called for transport- ETA for transport is that they should arrive "shortly- not a long wait ahead of patient". Paper work and GOLD DNR placed on shadow chart.   RNCM will sign off for now as intervention is no longer needed. Please re-consult  if new needs arise, or contact RNCM assigned to treatment team for further questions/concerns.      Final next level of care: [place] Barriers to Discharge: Barriers Resolved   Patient Goals and CMS Choice Patient states their goals for this hospitalization and ongoing recovery are:: return home w/ resumption of [facility] services CMS Medicare.gov Compare Post Acute Care list provided to:: Patient Choice offered to / list presented to : Patient, Sibling  Discharge Placement                 Home w/ [facility]      Discharge Plan and Services   Discharge Planning Services: CM Consult Post Acute Care Choice: Home Health, Resumption of Svcs/PTA Provider          DME Arranged: Vac DME Agency: [facility] Date DME Agency Contacted: 06/01/22 Time DME Agency Contacted: 1100 Representative spoke with at DME Agency: Olivia Mackie HH Arranged: RN, PT, OT [facility] Agency: [place] ([place]) Date [place]: 06/01/22 Time [place]: 1100 Representative spoke with at [place]: [place] ([place]) Interventions     Readmission Risk Interventions    06/04/2022   12:22 PM 05/18/2022    4:16 PM  Readmission Risk Prevention Plan  Post Dischage Appt  Complete  Medication Screening  Complete  Transportation Screening Complete Complete  PCP or Specialist Appt within 5-7 Days Complete   Home Care Screening Complete   Medication Review (RN CM) Complete

## 2022-06-04 NOTE — Progress Notes (Addendum)
Pt with stool in bed.  Will come back later this morning to change vac dressing once patient has been cleaned up.   Leontine Locket, [facility] 06/04/2022 7:36 AM    Progress Note    06/04/2022 9:33 AM 4 Days Post-Op  Subjective:  ready to go home  afebrile  Vitals:   06/04/22 0441 06/04/22 0814  BP: (!) 147/81 (!) 152/90  Pulse: 97 96  Resp: 17 18  Temp: 98.2 F (36.8 C) 98.3 F (36.8 C)  SpO2: 100% 97%    Physical Exam: General:  no distress Lungs:  non labored Incisions:  right groin and right AKA stump with good granulation tissue  Right groin     Right AKA wound    CBC    Component Value Date/Time   WBC 8.8 06/02/2022 0109   RBC 3.80 (L) 06/02/2022 0109   HGB 10.6 (L) 06/02/2022 0109   HCT 33.3 (L) 06/02/2022 0109   PLT 222 06/02/2022 0109   MCV 87.6 06/02/2022 0109   MCH 27.9 06/02/2022 0109   MCHC 31.8 06/02/2022 0109   RDW 16.0 (H) 06/02/2022 0109   LYMPHSABS 1.9 05/28/2022 0817   MONOABS 0.8 05/28/2022 0817   EOSABS 0.0 05/28/2022 0817   BASOSABS 0.0 05/28/2022 0817    BMET    Component Value Date/Time   NA 138 06/02/2022 0109   K 4.0 06/02/2022 0109   CL 105 06/02/2022 0109   CO2 24 06/02/2022 0109   GLUCOSE 103 (H) 06/02/2022 0109   BUN 17 06/02/2022 0109   CREATININE 0.83 06/02/2022 0109   CALCIUM 8.8 (L) 06/02/2022 0109   GFRNONAA >60 06/02/2022 0109   GFRAA >60 01/12/2020 1421    INR    Component Value Date/Time   INR 1.1 05/26/2022 2345     Intake/Output Summary (Last 24 hours) at 06/04/2022 0933 Last data filed at 06/04/2022 8338 Gross per 24 hour  Intake 100 ml  Output 1045 ml  Net -945 ml     Assessment/Plan:  51 y.o. male is s/p:   R AKA I&D   4 Days Post-Op   -wound vacs changed this morning and right groin and right AKA wounds with good granulation tissue -will need M/W/F wound vac changes with HH and f/u in our office 3-4 weeks and our office will arrange this appt    Leontine Locket, PA-C [facility] [phone] 06/04/2022 9:33 AM  I have seen and evaluated the patient. I agree with the PA note as documented above. Right groin wound and right AKA wound look excellent.  Will get 14 days keflex at discharge for right AKA wound cultures.  F/U arranged in office.  Will need outpatient HH with vac changes.    Marty Heck, MD [facility] [place] Office: [phone]

## 2022-06-04 NOTE — Progress Notes (Signed)
Patient is sleeping and refuses dressing changes at this time.

## 2022-06-04 NOTE — Discharge Summary (Signed)
Physician Discharge Summary  Chaney MallingFlicker Koslow BJY:[id number] DOB: December 24, 1970 DOA: 05/26/2022  PCP: Rocky MorelGoodwin, Joseph, DO  Admit date: 05/26/2022 Discharge date: 06/04/2022  Admitted From: Home Disposition:  Home (unable to place in SNF)   Recommendations for Outpatient Follow-up:  Follow up with PCP on 10/10  Follow up with Vascular Surgery in 3-4 weeks, office to arrange  M/W/F wound vac changes   Discharge Condition: Stable CODE STATUS: DNR  Diet recommendation: Heart healthy   Brief/Interim Summary: Jeffrey Fitzgerald is a 51 y.o. male with medical history significant of HTN, PAD with recent prolonged admission for BLE critical limb ischemia ultimately undergoing bilateral AKA, discharged home with wound vac 9/20 who presented to the ED 9/27 feeling poorly with poor oral intake, urinary symptoms, possible hematuria, and fevers documented by family/HH RN. He was found to be febrile with urinalysis suggestive of UTI. Also changes on CT around the aortic graft for which vascular surgery was consulted, feeling this is expected appearance postoperatively. No graft infection currently noted. Vascular surgery considering right AKA site washout. Hemodynamically patient is stabilizing on IV antibiotics. Antibiotics switched to PO for discharge.    10/2: Right AKA site washout, wound vac 10/4: Right AKA vac change  10/6: Right AKA vac change.   Discharge Diagnoses:   Principal Problem:   Sepsis secondary to UTI [facility](HCC) Active Problems:   Hypertension   Critical limb ischemia of both lower extremities (HCC)   PAD (peripheral artery disease) (HCC)   Acute postoperative anemia due to expected blood loss   Muscle weakness of left upper extremity   Tobacco dependence   Adjustment reaction   DNR (do not resuscitate)   Malnutrition of moderate degree   Sepsis due to UTI -With leukocytosis, tachycardia, tachypnea, encephalopathy. Leukocytosis resolved as of 10/2.  -Urine culture with E. coli,  Klebsiella. Improved with cefepime.    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 -S/p washout and wound vac placement 10/2 -Wound culture with staph aureus, e coli, pansensitive.  -Continue ancef --> keflex for discharge to complete total 14 day course    ABLA -S/p 2u PRBCs  -Stable   Acute metabolic encephalopathy -Resolved    Failure to thrive, debility s/p bilateral AKA -Strongly suspect he will continue to be high readmission risk if not discharged to SNF. Discharged home previously due to no payor source/medicaid pending. Unfortunately no option for SNF at this time. Sister to take him home, home health ordered    Left arm weakness -Continue PT/OT   HTN -Continue norvasc, coreg (increased dose 9/30), hydralazine as prescribed at recent discharge   Tobacco use -Cessation counseling, nicotine patch   Adjustment reaction -SSRI started 9/28, will monitor for tolerance, efficacy   AKI -Resolved    Moderate protein calorie malnutrition -RD consulted, supp protein   Discharge Instructions  Discharge Instructions     Call MD for:  difficulty breathing, headache or visual disturbances   Complete by: As directed    Call MD for:  extreme fatigue   Complete by: As directed    Call MD for:  persistant dizziness or light-headedness   Complete by: As directed    Call MD for:  persistant nausea and vomiting   Complete by: As directed    Call MD for:  redness, tenderness, or signs of infection (pain, swelling, redness, odor or green/yellow discharge around incision site)   Complete  by: As directed    Call MD for:  severe uncontrolled pain   Complete by: As directed    Call MD for:  temperature >100.4   Complete by: As directed    Diet general   Complete by: As directed    Discharge instructions   Complete by: As directed    You  were cared for by a hospitalist during your hospital stay. If you have any questions about your discharge medications or the care you received while you were in the hospital after you are discharged, you can call the unit and ask to speak with the hospitalist on call if the hospitalist that took care of you is not available. Once you are discharged, your primary care physician will handle any further medical issues. Please note that NO REFILLS for any discharge medications will be authorized once you are discharged, as it is imperative that you return to your primary care physician (or establish a relationship with a primary care physician if you do not have one) for your aftercare needs so that they can reassess your need for medications and monitor your lab values.   Discharge wound care:   Complete by: As directed    Wound vac change as directed by vascular surgery   Increase activity slowly   Complete by: As directed       Allergies as of 06/04/2022   No Known Allergies      Medication List     TAKE these medications    acetaminophen 500 MG tablet Commonly known as: TYLENOL Take 500 mg by mouth every 6 (six) hours as needed for moderate pain.   amLODipine 10 MG tablet Commonly known as: NORVASC Take 1 tablet (10 mg total) by mouth daily.   aspirin EC 81 MG tablet Take 1 tablet (81 mg total) by mouth daily. Swallow whole.   atorvastatin 80 MG tablet Commonly known as: LIPITOR Take 1 tablet (80 mg total) by mouth daily.   carvedilol 12.5 MG tablet Commonly known as: COREG Take 1 tablet (12.5 mg total) by mouth 2 (two) times daily with a meal. What changed:  medication strength how much to take   cephALEXin 500 MG capsule Commonly known as: KEFLEX Take 1 capsule (500 mg total) by mouth 4 (four) times daily for 6 days.   Gerhardt's butt cream Crea Apply 1 Application topically as needed for irritation (apply to periarea as needed for excoriation).   hydrALAZINE 25 MG  tablet Commonly known as: APRESOLINE Take 1 tablet (25 mg total) by mouth 2 (two) times daily.   nicotine 21 mg/24hr patch Commonly known as: NICODERM CQ - dosed in mg/24 hours Place 1 patch (21 mg total) onto the skin daily.   sertraline 25 MG tablet Commonly known as: ZOLOFT Take 1 tablet (25 mg total) by mouth daily. Start taking on: June 05, 2022               Discharge Care Instructions  (From admission, onward)           Start     Ordered   06/04/22 0000  Discharge wound care:       Comments: Wound vac change as directed by vascular surgery   06/04/22 1033            Follow-up Information     [facility] Follow up.   Why: ([facility]) HHRN/PT/OT services to resume [facility] for home wound VAC drsg change needs to Right groin and Right AKA)  [facility] Follow up.   Why: Continue home rental VAC- (already has home VAC at bedside) Contact information: Important Resources and Contacts  General Questions: [phone] Order Supplies: [phone], option 2, then dial ext. [facility] Technical Support: [phone], option 3.               No Known Allergies    Procedures/Studies: CT HEAD WO CONTRAST (5MM)  Result Date: 05/27/2022 CLINICAL DATA:  Mental status change. EXAM: CT HEAD WITHOUT CONTRAST TECHNIQUE: Contiguous axial images were obtained from the base of the skull through the vertex without intravenous contrast. RADIATION DOSE REDUCTION: This exam was performed according to the departmental dose-optimization program which includes automated exposure control, adjustment of the mA and/or kV according to patient size and/or use of iterative reconstruction technique. COMPARISON:  None Available. FINDINGS: Brain: Generalized parenchymal volume loss with commensurate dilatation of the ventricles and sulci. Chronic small vessel ischemic changes within the deep periventricular white matter regions bilaterally, and additional chronic  ischemic changes within the bilateral basal ganglia regions. Additional small old infarcts within the pons and cerebellum. No mass, hemorrhage, edema or other evidence of acute parenchymal abnormality. No extra-axial hemorrhage. Vascular: Chronic calcified atherosclerotic changes of the large vessels at the skull base. No unexpected hyperdense vessel. Skull: Normal. Negative for fracture or focal lesion. Sinuses/Orbits: No acute finding. Other: None. IMPRESSION: 1. No acute findings. No intracranial mass, hemorrhage or edema. 2. Chronic ischemic changes in the white matter, basal ganglia regions, pons and cerebellum. Electronically Signed   By: Franki Cabot M.D.   On: 05/27/2022 11:38   CT Angio Aortobifemoral W and/or Wo Contrast  Result Date: 05/27/2022 CLINICAL DATA:  Peripheral arterial disease, asymptomatic. Hematuria with low-grade fever. Recent bilateral below-the-knee amputation. EXAM: CT ANGIOGRAPHY OF ABDOMINAL AORTA WITH ILIOFEMORAL RUNOFF TECHNIQUE: Multidetector CT imaging of the abdomen, pelvis and lower extremities was performed using the standard protocol during bolus administration of intravenous contrast. Multiplanar CT image reconstructions and MIPs were obtained to evaluate the vascular anatomy. RADIATION DOSE REDUCTION: This exam was performed according to the departmental dose-optimization program which includes automated exposure control, adjustment of the mA and/or kV according to patient size and/or use of iterative reconstruction technique. CONTRAST:  48mL OMNIPAQUE IOHEXOL 350 MG/ML SOLN COMPARISON:  03/08/2022. FINDINGS: VASCULAR Aorta: Aortic atherosclerosis. There is evidence of aortobifemoral bypass grafts. The aorta is patent. Fluid attenuation and fat stranding are noted in the retroperitoneum, expanding the perigraft soft tissue. No associated gas is seen within the fluid. No significant stenosis, aneurysm, or dissection. Celiac: Patent without evidence of aneurysm, dissection,  vasculitis or significant stenosis. SMA: Patent without evidence of aneurysm, dissection, vasculitis or significant stenosis. Renals: Both renal arteries are patent without evidence of aneurysm, dissection, vasculitis, fibromuscular dysplasia or significant stenosis. An accessory renal artery is noted on the left. IMA: Occluded proximally with reconstitution via collaterals. RIGHT Lower Extremity Inflow: Aortofemoral bypass graft is patent. Outflow: The bypass graft and common femoral and profunda femoral arteries are patent. There is occlusion of the superficial femoral artery. Runoff: Below-the-knee amputation. LEFT Lower Extremity Inflow: Aortofemoral bypass graft is patent. Outflow: The bypass graft, common femoral, and profunda femoral arteries are patent. There is narrowing with occlusion of the superficial femoral artery in the proximal thigh. Runoff: Below-the-knee amputation. Veins: No obvious venous abnormality within the limitations of this arterial phase study. Review of the MIP images confirms the above findings. NON-VASCULAR Lower chest: No acute abnormality. Hepatobiliary: No focal liver abnormality is seen. No gallstones, gallbladder wall thickening,  or biliary dilatation. Pancreas: Unremarkable. No pancreatic ductal dilatation or surrounding inflammatory changes. Spleen: Normal in size. There is a stable hypervascular focus in the spleen, possible hemangioma. Adrenals/Urinary Tract: The adrenal glands are within normal limits. The kidneys enhance symmetrically. No renal calculus or hydronephrosis. The bladder is unremarkable. Stomach/Bowel: Stomach is within normal limits. Appendix appears normal. No bowel obstruction. No free air or pneumatosis. Moderate amount of retained stool is present in the colon. The rectum is distended with stool and there is mild rectal wall thickening with surrounding fat stranding. Lymphatic: There is a 1.8 cm soft tissue mass in the retroperitoneum on the left at the  level of the lower pole of the left kidney. Reproductive: Prostate gland is enlarged. Other: Asymmetrically increased soft tissue fat stranding is noted in the right groin with associated prominent lymph nodes. Musculoskeletal: Bilateral below-the-knee amputation changes are noted in the lower extremities bilaterally. Surgical clips, fluid attenuation, and fat stranding is noted at the postsurgical sites. Evaluation for abscess is limited due to timing of contrast bolus. A fluid collection with surrounding hyperdense material is noted surrounding the distal femoral stump on the left. IMPRESSION: VASCULAR 1. Patent aortobifemoral bypass graft. Retroperitoneal fat stranding and fluid attenuation are noted surrounding the graft. No gas is identified. A 1.8 cm soft tissue density is noted in the periaortic region on the left, possible enlarged lymph node. Findings may be related to history of recent surgery. The possibility of perigraft infection can not be excluded. Comparison with more recent imaging studies and short-term follow-up are recommended. 2. Occlusion of the superficial femoral arteries bilaterally. Profunda femoral arteries appear patent. NON-VASCULAR 1. Moderate amount of retained stool in the colon and large amount of stool in the rectum with mild rectal wall thickening and surrounding fat stranding, possible stercoral colitis. 2. Asymmetrically increased soft tissue fat stranding and edema in the right groin, may be postsurgical. The possibility of superimposed infection can not be excluded. 3. Status post bilateral below-the-knee amputation with edema and fat stranding at the postsurgical sites which may be postoperative. There is a fluid collection with surrounding hyperdense material at the left femoral stump which may be postsurgical. Correlate clinically to exclude infection. 4. Remaining incidental findings as described above. Electronically Signed   By: Brett Fairy M.D.   On: 05/27/2022 00:30    DG Chest Port 1 View  Result Date: 05/26/2022 CLINICAL DATA:  Possible sepsis. EXAM: PORTABLE CHEST 1 VIEW COMPARISON:  04/18/2022. FINDINGS: The heart size and mediastinal contours are within normal limits. Both lungs are clear. No acute osseous abnormality. IMPRESSION: No active disease. Electronically Signed   By: Brett Fairy M.D.   On: 05/26/2022 23:11      Discharge Exam: Vitals:   06/04/22 0441 06/04/22 0814  BP: (!) 147/81 (!) 152/90  Pulse: 97 96  Resp: 17 18  Temp: 98.2 F (36.8 C) 98.3 F (36.8 C)  SpO2: 100% 97%    General: Pt is alert, awake, not in acute distress Cardiovascular: RRR, S1/S2 +, no edema Respiratory: CTA bilaterally, no wheezing, no rhonchi, no respiratory distress, no conversational dyspnea  Abdominal: Soft, NT, ND, bowel sounds + Extremities: bilateral AKA, right wound vac present  Psych: Normal mood and affect, stable judgement and insight     The results of significant diagnostics from this hospitalization (including imaging, microbiology, ancillary and laboratory) are listed below for reference.     Microbiology: Recent Results (from the past 240 hour(s))  Blood Culture (routine x 2)  Status: None   Collection Time: 05/26/22 11:45 PM   Specimen: BLOOD LEFT FOREARM  Result Value Ref Range Status   Specimen Description BLOOD LEFT FOREARM  Final   Special Requests   Final    BOTTLES DRAWN AEROBIC AND ANAEROBIC Blood Culture adequate volume   Culture   Final    NO GROWTH 5 DAYS Performed at [facility], [address] [place], [place] [address]    Report Status 06/01/2022 FINAL  Final  Blood Culture (routine x 2)     Status: Abnormal   Collection Time: 05/26/22 11:45 PM   Specimen: BLOOD  Result Value Ref Range Status   Specimen Description BLOOD RIGHT ANTECUBITAL  Final   Special Requests   Final    BOTTLES DRAWN AEROBIC AND ANAEROBIC Blood Culture results may not be optimal due to an inadequate volume of blood received  in culture bottles   Culture  Setup Time   Final    GRAM POSITIVE RODS ANAEROBIC BOTTLE ONLY CRITICAL RESULT CALLED TO, READ BACK BY AND VERIFIED WITH: PHARMD [place]  C. [id number] FCP    Culture (A)  Final    PROPIONIBACTERIUM ACNES Standardized susceptibility testing for this organism is not available. Performed at [facility], [place] [address], [place], [place] [address]    Report Status 06/02/2022 FINAL  Final  Resp Panel by RT-PCR (Flu A&B, Covid)     Status: None   Collection Time: 05/26/22 11:45 PM   Specimen: Nasal Swab  Result Value Ref Range Status   SARS Coronavirus 2 by RT PCR NEGATIVE NEGATIVE Final    Comment: (NOTE) SARS-CoV-2 target nucleic acids are NOT DETECTED.  The SARS-CoV-2 RNA is generally detectable in upper respiratory specimens during the acute phase of infection. The lowest concentration of SARS-CoV-2 viral copies this assay can detect is 138 copies/mL. A negative result does not preclude SARS-Cov-2 infection and should not be used as the sole basis for treatment or other patient management decisions. A negative result may occur with  improper specimen collection/handling, submission of specimen other than nasopharyngeal swab, presence of viral mutation(s) within the areas targeted by this assay, and inadequate number of viral copies(<138 copies/mL). A negative result must be combined with clinical observations, patient history, and epidemiological information. The expected result is Negative.  Fact Sheet for Patients:  EntrepreneurPulse.com.au  Fact Sheet for Healthcare Providers:  IncredibleEmployment.be  This test is no t yet approved or cleared by the [place] FDA and  has been authorized for detection and/or diagnosis of SARS-CoV-2 by FDA under an Emergency Use Authorization (EUA). This EUA will remain  in effect (meaning this test can be used) for the duration of the COVID-19 declaration under  Section 564(b)(1) of the Act, 21 U.S.C.section 360bbb-3(b)(1), unless the authorization is terminated  or revoked sooner.       Influenza A by PCR NEGATIVE NEGATIVE Final   Influenza B by PCR NEGATIVE NEGATIVE Final    Comment: (NOTE) The Xpert Xpress SARS-CoV-2/FLU/RSV plus assay is intended as an aid in the diagnosis of influenza from Nasopharyngeal swab specimens and should not be used as a sole basis for treatment. Nasal washings and aspirates are unacceptable for Xpert Xpress SARS-CoV-2/FLU/RSV testing.  Fact Sheet for Patients: EntrepreneurPulse.com.au  Fact Sheet for Healthcare Providers: IncredibleEmployment.be  This test is not yet approved or cleared by the [place] FDA and has been authorized for detection and/or diagnosis of SARS-CoV-2 by FDA under an Emergency Use Authorization (EUA).  This EUA will remain in effect (meaning this test can be used) for the duration of the COVID-19 declaration under Section 564(b)(1) of the Act, 21 U.S.C. section 360bbb-3(b)(1), unless the authorization is terminated or revoked.  Performed at [facility], [place] [address], [place], [place] [address]   Urine Culture     Status: Abnormal   Collection Time: 05/27/22  1:20 AM   Specimen: In/Out Cath Urine  Result Value Ref Range Status   Specimen Description IN/OUT CATH URINE  Final   Special Requests   Final    NONE Performed at [facility], [place] [address], [place], [place] [address]    Culture (A)  Final    >=100,000 COLONIES/mL ESCHERICHIA COLI >=100,000 COLONIES/mL KLEBSIELLA OXYTOCA    Report Status 05/29/2022 FINAL  Final   Organism ID, Bacteria ESCHERICHIA COLI (A)  Final   Organism ID, Bacteria KLEBSIELLA OXYTOCA (A)  Final      Susceptibility   Escherichia coli - MIC*    AMPICILLIN <=2 SENSITIVE Sensitive     CEFAZOLIN <=4 SENSITIVE Sensitive     CEFEPIME <=0.12 SENSITIVE Sensitive     CEFTRIAXONE <=0.25 SENSITIVE  Sensitive     CIPROFLOXACIN <=0.25 SENSITIVE Sensitive     GENTAMICIN <=1 SENSITIVE Sensitive     IMIPENEM <=0.25 SENSITIVE Sensitive     NITROFURANTOIN <=16 SENSITIVE Sensitive     TRIMETH/SULFA <=20 SENSITIVE Sensitive     AMPICILLIN/SULBACTAM <=2 SENSITIVE Sensitive     PIP/TAZO <=4 SENSITIVE Sensitive     * >=100,000 COLONIES/mL ESCHERICHIA COLI   Klebsiella oxytoca - MIC*    AMPICILLIN >=32 RESISTANT Resistant     CEFAZOLIN 8 SENSITIVE Sensitive     CEFEPIME <=0.12 SENSITIVE Sensitive     CEFTRIAXONE <=0.25 SENSITIVE Sensitive     CIPROFLOXACIN <=0.25 SENSITIVE Sensitive     GENTAMICIN <=1 SENSITIVE Sensitive     IMIPENEM <=0.25 SENSITIVE Sensitive     NITROFURANTOIN 32 SENSITIVE Sensitive     TRIMETH/SULFA <=20 SENSITIVE Sensitive     AMPICILLIN/SULBACTAM 16 INTERMEDIATE Intermediate     PIP/TAZO <=4 SENSITIVE Sensitive     * >=100,000 COLONIES/mL KLEBSIELLA OXYTOCA  Aerobic/Anaerobic Culture w Gram Stain (surgical/deep wound)     Status: None (Preliminary result)   Collection Time: 05/31/22 11:30 AM   Specimen: Leg, Right; Wound  Result Value Ref Range Status   Specimen Description WOUND  Final   Special Requests GROIN  Final   Gram Stain   Final    ABUNDANT WBC PRESENT, PREDOMINANTLY PMN NO ORGANISMS SEEN Performed at [facility], [address] [place], [place] [address]    Culture   Final    RARE STAPHYLOCOCCUS AUREUS RARE ESCHERICHIA COLI NO ANAEROBES ISOLATED; CULTURE IN PROGRESS FOR 5 DAYS    Report Status PENDING  Incomplete   Organism ID, Bacteria STAPHYLOCOCCUS AUREUS  Final   Organism ID, Bacteria ESCHERICHIA COLI  Final      Susceptibility   Escherichia coli - MIC*    AMPICILLIN <=2 SENSITIVE Sensitive     CEFAZOLIN <=4 SENSITIVE Sensitive     CEFEPIME <=0.12 SENSITIVE Sensitive     CEFTAZIDIME <=1 SENSITIVE Sensitive     CEFTRIAXONE <=0.25 SENSITIVE Sensitive     CIPROFLOXACIN <=0.25 SENSITIVE Sensitive     GENTAMICIN <=1 SENSITIVE Sensitive      IMIPENEM <=0.25 SENSITIVE Sensitive     TRIMETH/SULFA <=20 SENSITIVE Sensitive     AMPICILLIN/SULBACTAM <=2 SENSITIVE Sensitive     PIP/TAZO <=4  SENSITIVE Sensitive     * RARE ESCHERICHIA COLI   Staphylococcus aureus - MIC*    CIPROFLOXACIN <=0.5 SENSITIVE Sensitive     ERYTHROMYCIN <=0.25 SENSITIVE Sensitive     GENTAMICIN <=0.5 SENSITIVE Sensitive     OXACILLIN 0.5 SENSITIVE Sensitive     TETRACYCLINE <=1 SENSITIVE Sensitive     VANCOMYCIN <=0.5 SENSITIVE Sensitive     TRIMETH/SULFA <=10 SENSITIVE Sensitive     CLINDAMYCIN <=0.25 SENSITIVE Sensitive     RIFAMPIN <=0.5 SENSITIVE Sensitive     Inducible Clindamycin NEGATIVE Sensitive     * RARE STAPHYLOCOCCUS AUREUS     Labs: BNP (last 3 results) No results for input(s): "BNP" in the last 8760 hours. Basic Metabolic Panel: Recent Labs  Lab 05/29/22 0133 05/31/22 0159 06/02/22 0109  NA 138 138 138  K 3.6 3.7 4.0  CL 105 107 105  CO2 20* 22 24  GLUCOSE 113* 105* 103*  BUN 11 16 17   CREATININE 0.88 0.99 0.83  CALCIUM 9.0 8.9 8.8*   Liver Function Tests: No results for input(s): "AST", "ALT", "ALKPHOS", "BILITOT", "PROT", "ALBUMIN" in the last 168 hours. No results for input(s): "LIPASE", "AMYLASE" in the last 168 hours. No results for input(s): "AMMONIA" in the last 168 hours. CBC: Recent Labs  Lab 05/29/22 0133 05/31/22 0159 06/02/22 0109  WBC 11.4* 7.8 8.8  HGB 12.2* 11.5* 10.6*  HCT 36.3* 35.2* 33.3*  MCV 84.0 86.7 87.6  PLT 314 271 222   Cardiac Enzymes: No results for input(s): "CKTOTAL", "CKMB", "CKMBINDEX", "TROPONINI" in the last 168 hours. BNP: Invalid input(s): "POCBNP" CBG: Recent Labs  Lab 05/28/22 2136 05/29/22 0625 05/31/22 1126  GLUCAP 109* 104* 103*   D-Dimer No results for input(s): "DDIMER" in the last 72 hours. Hgb A1c No results for input(s): "HGBA1C" in the last 72 hours. Lipid Profile No results for input(s): "CHOL", "HDL", "LDLCALC", "TRIG", "CHOLHDL", "LDLDIRECT" in the  last 72 hours. Thyroid function studies No results for input(s): "TSH", "T4TOTAL", "T3FREE", "THYROIDAB" in the last 72 hours.  Invalid input(s): "FREET3" Anemia work up No results for input(s): "VITAMINB12", "FOLATE", "FERRITIN", "TIBC", "IRON", "RETICCTPCT" in the last 72 hours. Urinalysis    Component Value Date/Time   COLORURINE YELLOW 05/27/2022 0120   APPEARANCEUR HAZY (A) 05/27/2022 0120   LABSPEC 1.025 05/27/2022 0120   PHURINE 6.0 05/27/2022 0120   GLUCOSEU NEGATIVE 05/27/2022 0120   HGBUR SMALL (A) 05/27/2022 0120   BILIRUBINUR NEGATIVE 05/27/2022 0120   KETONESUR NEGATIVE 05/27/2022 0120   PROTEINUR NEGATIVE 05/27/2022 0120   NITRITE NEGATIVE 05/27/2022 0120   LEUKOCYTESUR MODERATE (A) 05/27/2022 0120   Sepsis Labs Recent Labs  Lab 05/29/22 0133 05/31/22 0159 06/02/22 0109  WBC 11.4* 7.8 8.8   Microbiology Recent Results (from the past 240 hour(s))  Blood Culture (routine x 2)     Status: None   Collection Time: 05/26/22 11:45 PM   Specimen: BLOOD LEFT FOREARM  Result Value Ref Range Status   Specimen Description BLOOD LEFT FOREARM  Final   Special Requests   Final    BOTTLES DRAWN AEROBIC AND ANAEROBIC Blood Culture adequate volume   Culture   Final    NO GROWTH 5 DAYS Performed at [facility], [place] [address], [place], [place] [address]    Report Status 06/01/2022 FINAL  Final  Blood Culture (routine x 2)     Status: Abnormal   Collection Time: 05/26/22 11:45 PM   Specimen: BLOOD  Result Value Ref Range Status  Specimen Description BLOOD RIGHT ANTECUBITAL  Final   Special Requests   Final    BOTTLES DRAWN AEROBIC AND ANAEROBIC Blood Culture results may not be optimal due to an inadequate volume of blood received in culture bottles   Culture  Setup Time   Final    GRAM POSITIVE RODS ANAEROBIC BOTTLE ONLY CRITICAL RESULT CALLED TO, READ BACK BY AND VERIFIED WITH: PHARMD JESSICA  C. [id number] FCP    Culture (A)  Final    PROPIONIBACTERIUM  ACNES Standardized susceptibility testing for this organism is not available. Performed at [facility], [address], [place], [place] [address]    Report Status 06/02/2022 FINAL  Final  Resp Panel by RT-PCR (Flu A&B, Covid)     Status: None   Collection Time: 05/26/22 11:45 PM   Specimen: Nasal Swab  Result Value Ref Range Status   SARS Coronavirus 2 by RT PCR NEGATIVE NEGATIVE Final    Comment: (NOTE) SARS-CoV-2 target nucleic acids are NOT DETECTED.  The SARS-CoV-2 RNA is generally detectable in upper respiratory specimens during the acute phase of infection. The lowest concentration of SARS-CoV-2 viral copies this assay can detect is 138 copies/mL. A negative result does not preclude SARS-Cov-2 infection and should not be used as the sole basis for treatment or other patient management decisions. A negative result may occur with  improper specimen collection/handling, submission of specimen other than nasopharyngeal swab, presence of viral mutation(s) within the areas targeted by this assay, and inadequate number of viral copies(<138 copies/mL). A negative result must be combined with clinical observations, patient history, and epidemiological information. The expected result is Negative.  Fact Sheet for Patients:  BloggerCourse.com  Fact Sheet for Healthcare Providers:  SeriousBroker.it  This test is no t yet approved or cleared by the [place] FDA and  has been authorized for detection and/or diagnosis of SARS-CoV-2 by FDA under an Emergency Use Authorization (EUA). This EUA will remain  in effect (meaning this test can be used) for the duration of the COVID-19 declaration under Section 564(b)(1) of the Act, 21 U.S.C.section 360bbb-3(b)(1), unless the authorization is terminated  or revoked sooner.       Influenza A by PCR NEGATIVE NEGATIVE Final   Influenza B by PCR NEGATIVE NEGATIVE Final    Comment:  (NOTE) The Xpert Xpress SARS-CoV-2/FLU/RSV plus assay is intended as an aid in the diagnosis of influenza from Nasopharyngeal swab specimens and should not be used as a sole basis for treatment. Nasal washings and aspirates are unacceptable for Xpert Xpress SARS-CoV-2/FLU/RSV testing.  Fact Sheet for Patients: BloggerCourse.com  Fact Sheet for Healthcare Providers: SeriousBroker.it  This test is not yet approved or cleared by the [place] FDA and has been authorized for detection and/or diagnosis of SARS-CoV-2 by FDA under an Emergency Use Authorization (EUA). This EUA will remain in effect (meaning this test can be used) for the duration of the COVID-19 declaration under Section 564(b)(1) of the Act, 21 U.S.C. section 360bbb-3(b)(1), unless the authorization is terminated or revoked.  Performed at [facility], [address] [place], [place] [address]   Urine Culture     Status: Abnormal   Collection Time: 05/27/22  1:20 AM   Specimen: In/Out Cath Urine  Result Value Ref Range Status   Specimen Description IN/OUT CATH URINE  Final   Special Requests   Final    NONE Performed at [facility], [address] [place], [place] [address]  Culture (A)  Final    >=100,000 COLONIES/mL ESCHERICHIA COLI >=100,000 COLONIES/mL KLEBSIELLA OXYTOCA    Report Status 05/29/2022 FINAL  Final   Organism ID, Bacteria ESCHERICHIA COLI (A)  Final   Organism ID, Bacteria KLEBSIELLA OXYTOCA (A)  Final      Susceptibility   Escherichia coli - MIC*    AMPICILLIN <=2 SENSITIVE Sensitive     CEFAZOLIN <=4 SENSITIVE Sensitive     CEFEPIME <=0.12 SENSITIVE Sensitive     CEFTRIAXONE <=0.25 SENSITIVE Sensitive     CIPROFLOXACIN <=0.25 SENSITIVE Sensitive     GENTAMICIN <=1 SENSITIVE Sensitive     IMIPENEM <=0.25 SENSITIVE Sensitive     NITROFURANTOIN <=16 SENSITIVE Sensitive     TRIMETH/SULFA <=20 SENSITIVE Sensitive      AMPICILLIN/SULBACTAM <=2 SENSITIVE Sensitive     PIP/TAZO <=4 SENSITIVE Sensitive     * >=100,000 COLONIES/mL ESCHERICHIA COLI   Klebsiella oxytoca - MIC*    AMPICILLIN >=32 RESISTANT Resistant     CEFAZOLIN 8 SENSITIVE Sensitive     CEFEPIME <=0.12 SENSITIVE Sensitive     CEFTRIAXONE <=0.25 SENSITIVE Sensitive     CIPROFLOXACIN <=0.25 SENSITIVE Sensitive     GENTAMICIN <=1 SENSITIVE Sensitive     IMIPENEM <=0.25 SENSITIVE Sensitive     NITROFURANTOIN 32 SENSITIVE Sensitive     TRIMETH/SULFA <=20 SENSITIVE Sensitive     AMPICILLIN/SULBACTAM 16 INTERMEDIATE Intermediate     PIP/TAZO <=4 SENSITIVE Sensitive     * >=100,000 COLONIES/mL KLEBSIELLA OXYTOCA  Aerobic/Anaerobic Culture w Gram Stain (surgical/deep wound)     Status: None (Preliminary result)   Collection Time: 05/31/22 11:30 AM   Specimen: Leg, Right; Wound  Result Value Ref Range Status   Specimen Description WOUND  Final   Special Requests GROIN  Final   Gram Stain   Final    ABUNDANT WBC PRESENT, PREDOMINANTLY PMN NO ORGANISMS SEEN Performed at [facility], [address], [place], [place] [address]    Culture   Final    RARE STAPHYLOCOCCUS AUREUS RARE ESCHERICHIA COLI NO ANAEROBES ISOLATED; CULTURE IN PROGRESS FOR 5 DAYS    Report Status PENDING  Incomplete   Organism ID, Bacteria STAPHYLOCOCCUS AUREUS  Final   Organism ID, Bacteria ESCHERICHIA COLI  Final      Susceptibility   Escherichia coli - MIC*    AMPICILLIN <=2 SENSITIVE Sensitive     CEFAZOLIN <=4 SENSITIVE Sensitive     CEFEPIME <=0.12 SENSITIVE Sensitive     CEFTAZIDIME <=1 SENSITIVE Sensitive     CEFTRIAXONE <=0.25 SENSITIVE Sensitive     CIPROFLOXACIN <=0.25 SENSITIVE Sensitive     GENTAMICIN <=1 SENSITIVE Sensitive     IMIPENEM <=0.25 SENSITIVE Sensitive     TRIMETH/SULFA <=20 SENSITIVE Sensitive     AMPICILLIN/SULBACTAM <=2 SENSITIVE Sensitive     PIP/TAZO <=4 SENSITIVE Sensitive     * RARE ESCHERICHIA COLI   Staphylococcus aureus -  MIC*    CIPROFLOXACIN <=0.5 SENSITIVE Sensitive     ERYTHROMYCIN <=0.25 SENSITIVE Sensitive     GENTAMICIN <=0.5 SENSITIVE Sensitive     OXACILLIN 0.5 SENSITIVE Sensitive     TETRACYCLINE <=1 SENSITIVE Sensitive     VANCOMYCIN <=0.5 SENSITIVE Sensitive     TRIMETH/SULFA <=10 SENSITIVE Sensitive     CLINDAMYCIN <=0.25 SENSITIVE Sensitive     RIFAMPIN <=0.5 SENSITIVE Sensitive     Inducible Clindamycin NEGATIVE Sensitive     * RARE STAPHYLOCOCCUS AUREUS     Patient was seen and examined on the day of  discharge and was found to be in stable condition. Time coordinating discharge: 35 minutes including assessment and coordination of care, as well as examination of the patient.   SIGNED:  Dessa Phi, DO [facility] 06/04/2022, 10:35 AM

## 2022-06-05 LAB — AEROBIC/ANAEROBIC CULTURE W GRAM STAIN (SURGICAL/DEEP WOUND)

## 2022-06-07 ENCOUNTER — Encounter (HOSPITAL_COMMUNITY): Payer: Self-pay | Admitting: Vascular Surgery

## 2022-06-08 ENCOUNTER — Other Ambulatory Visit (HOSPITAL_COMMUNITY): Payer: Self-pay

## 2022-06-08 ENCOUNTER — Ambulatory Visit (INDEPENDENT_AMBULATORY_CARE_PROVIDER_SITE_OTHER): Payer: Medicaid Other | Admitting: Student

## 2022-06-08 ENCOUNTER — Encounter: Payer: Self-pay | Admitting: Student

## 2022-06-08 ENCOUNTER — Other Ambulatory Visit: Payer: Self-pay

## 2022-06-08 VITALS — BP 113/73 | HR 87 | Temp 98.3°F

## 2022-06-08 DIAGNOSIS — F4329 Adjustment disorder with other symptoms: Secondary | ICD-10-CM

## 2022-06-08 DIAGNOSIS — L89309 Pressure ulcer of unspecified buttock, unspecified stage: Secondary | ICD-10-CM

## 2022-06-08 DIAGNOSIS — K5900 Constipation, unspecified: Secondary | ICD-10-CM | POA: Diagnosis not present

## 2022-06-08 DIAGNOSIS — N179 Acute kidney failure, unspecified: Secondary | ICD-10-CM

## 2022-06-08 DIAGNOSIS — M6281 Muscle weakness (generalized): Secondary | ICD-10-CM | POA: Diagnosis not present

## 2022-06-08 DIAGNOSIS — Z89612 Acquired absence of left leg above knee: Secondary | ICD-10-CM | POA: Diagnosis not present

## 2022-06-08 DIAGNOSIS — I10 Essential (primary) hypertension: Secondary | ICD-10-CM | POA: Diagnosis present

## 2022-06-08 DIAGNOSIS — F32A Depression, unspecified: Secondary | ICD-10-CM

## 2022-06-08 DIAGNOSIS — I70223 Atherosclerosis of native arteries of extremities with rest pain, bilateral legs: Secondary | ICD-10-CM

## 2022-06-08 DIAGNOSIS — D62 Acute posthemorrhagic anemia: Secondary | ICD-10-CM

## 2022-06-08 DIAGNOSIS — Z89611 Acquired absence of right leg above knee: Secondary | ICD-10-CM

## 2022-06-08 DIAGNOSIS — F17200 Nicotine dependence, unspecified, uncomplicated: Secondary | ICD-10-CM | POA: Diagnosis not present

## 2022-06-08 DIAGNOSIS — I739 Peripheral vascular disease, unspecified: Secondary | ICD-10-CM | POA: Diagnosis not present

## 2022-06-08 MED ORDER — CARVEDILOL 12.5 MG PO TABS
12.5000 mg | ORAL_TABLET | Freq: Two times a day (BID) | ORAL | 0 refills | Status: DC
  Filled 2022-06-08 – 2022-06-30 (×3): qty 60, 30d supply, fill #0

## 2022-06-08 MED ORDER — POLYETHYLENE GLYCOL 3350 17 G PO PACK: 17.0000 g | PACK | Freq: Every day | ORAL | Status: DC

## 2022-06-08 MED ORDER — ASPIRIN 81 MG PO TBEC: 81.0000 mg | DELAYED_RELEASE_TABLET | Freq: Every day | ORAL | 12 refills | Status: DC

## 2022-06-08 MED ORDER — GERHARDT'S BUTT CREAM
1.0000 | TOPICAL_CREAM | CUTANEOUS | 0 refills | Status: DC | PRN
  Filled 2022-06-08: qty 1, fill #0

## 2022-06-08 MED ORDER — ACETAMINOPHEN 500 MG PO TABS
500.0000 mg | ORAL_TABLET | Freq: Four times a day (QID) | ORAL | 3 refills | Status: DC | PRN
  Filled 2022-06-08: qty 30, 8d supply, fill #0

## 2022-06-08 MED ORDER — DOCUSATE SODIUM 100 MG PO CAPS
100.0000 mg | ORAL_CAPSULE | Freq: Every day | ORAL | 1 refills | Status: DC
  Filled 2022-06-08: qty 30, 30d supply, fill #0

## 2022-06-08 MED ORDER — SERTRALINE HCL 25 MG PO TABS
25.0000 mg | ORAL_TABLET | Freq: Every day | ORAL | 1 refills | Status: DC
  Filled 2022-06-08 – 2022-06-30 (×4): qty 30, 30d supply, fill #0
  Filled 2022-07-26: qty 30, 30d supply, fill #1

## 2022-06-08 MED ORDER — OXYCODONE-ACETAMINOPHEN 5-325 MG PO TABS
1.0000 | ORAL_TABLET | ORAL | 0 refills | Status: AC | PRN
  Filled 2022-06-08 – 2022-06-21 (×2): qty 18, 3d supply, fill #0

## 2022-06-08 MED ORDER — AMLODIPINE BESYLATE 10 MG PO TABS
10.0000 mg | ORAL_TABLET | Freq: Every day | ORAL | 1 refills | Status: DC
  Filled 2022-06-08 – 2022-06-21 (×2): qty 30, 30d supply, fill #0
  Filled 2022-07-26: qty 30, 30d supply, fill #1

## 2022-06-08 MED ORDER — HYDRALAZINE HCL 25 MG PO TABS
25.0000 mg | ORAL_TABLET | Freq: Two times a day (BID) | ORAL | 1 refills | Status: DC
  Filled 2022-06-08 – 2022-06-21 (×2): qty 60, 30d supply, fill #0
  Filled 2022-07-26: qty 60, 30d supply, fill #1

## 2022-06-08 MED ORDER — ATORVASTATIN CALCIUM 80 MG PO TABS
80.0000 mg | ORAL_TABLET | Freq: Every day | ORAL | 1 refills | Status: DC
  Filled 2022-06-08 – 2022-06-21 (×2): qty 30, 30d supply, fill #0
  Filled 2022-07-26: qty 30, 30d supply, fill #1

## 2022-06-08 NOTE — Assessment & Plan Note (Signed)
Patient presents for hospital follow-up after admission from 05/26/2022 to 06/04/2022. 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  Wound cultures were positive for Staph aureus, E. coli and were pansensitive.  He will have his wound VAC removed tomorrow and will finish his 14-day course of antibiotics (currently on Keflex) on 06/10/2022.  05/26/2022 CT angio of the aorta and femoral arteries showed patent aortobifemoral bypass graft with some retroperitoneal fat stranding and fluid attenuation around the graft and occlusion of the superficial femoral arteries bilaterally.  Patient is no complaints following discharge from hospital.  He continues with wound VAC without issue and has not changed 3 days a week by wound care at his home.  Today his wound VAC has minimal output and no evidence of purulent drainage.  The wound itself appears to be healing well without signs of infection in the surrounding tissue.  He has had to have his wound VAC removed tomorrow and was advised that he may need some pain medication to help with the pain during and after this. -Continue with wound VAC removal tomorrow and we will prescribe 3 days of Percocet 5-325 mg - Complete antibiotic course on 06/10/2022 - Referral sent to vascular surgery to ensure follow-up after hospitalization

## 2022-06-08 NOTE — Patient Instructions (Signed)
Thank you, [name], for allowing [place] to provide your care today. Today we discussed . . .  > Right leg wound after AKA       -Please continue with your planned wound care and wound VAC removal tomorrow.  We will write you a prescription for a short course of pain medication that you may take as needed for the wound VAC removal.  Please call the office if you have any pain, redness, swelling, or drainage from your wounds.  We have also sent a referral to vascular surgery so they should call you to set up an appointment to follow-up with them. > Hypertension       -We refilled your medications today.  Your blood pressure is well within normal limits at 113/73.  Please continue your amlodipine, carvedilol, and hydralazine as prescribed. > Constipation       -Please take MiraLAX and docusate daily for your constipation.  Please call our office if you continue to have issues with incontinence at night.   I have ordered the following labs for you:  Lab Orders         CBC with Diff       Tests ordered today:  None   Referrals ordered today:   Referral Orders         Ambulatory referral to Vascular Surgery       I have ordered the following medication/changed the following medications:   Stop the following medications: Medications Discontinued During This Encounter  Medication Reason   cephALEXin (KEFLEX) 500 MG capsule Completed Course   nicotine (NICODERM CQ - DOSED IN MG/24 HOURS) 21 mg/24hr patch Patient Preference   acetaminophen (TYLENOL) 500 MG tablet Reorder   aspirin EC 81 MG tablet Reorder   amLODipine (NORVASC) 10 MG tablet Reorder   atorvastatin (LIPITOR) 80 MG tablet Reorder   hydrALAZINE (APRESOLINE) 25 MG tablet Reorder   Nystatin (GERHARDT'S BUTT CREAM) CREA Reorder   carvedilol (COREG) 12.5 MG tablet Reorder   sertraline (ZOLOFT) 25 MG tablet Reorder     Start the following medications: Meds ordered this encounter  Medications   acetaminophen  (TYLENOL) 500 MG tablet    Sig: Take 1 tablet (500 mg total) by mouth every 6 (six) hours as needed for moderate pain.    Dispense:  30 tablet    Refill:  3   amLODipine (NORVASC) 10 MG tablet    Sig: Take 1 tablet (10 mg total) by mouth daily.    Dispense:  30 tablet    Refill:  1    IM Program   aspirin EC 81 MG tablet    Sig: Take 1 tablet (81 mg total) by mouth daily. Swallow whole.    Dispense:  30 tablet    Refill:  12   atorvastatin (LIPITOR) 80 MG tablet    Sig: Take 1 tablet (80 mg total) by mouth daily.    Dispense:  30 tablet    Refill:  1    IM Program   carvedilol (COREG) 12.5 MG tablet    Sig: Take 1 tablet (12.5 mg total) by mouth 2 (two) times daily with a meal.    Dispense:  60 tablet    Refill:  0    IM Program   hydrALAZINE (APRESOLINE) 25 MG tablet    Sig: Take 1 tablet (25 mg total) by mouth 2 (two) times daily.    Dispense:  60 tablet    Refill:  1    IM  Program   Nystatin (GERHARDT'S BUTT CREAM) CREA    Sig: Apply 1 Application topically as needed for irritation (apply to periarea as needed for excoriation).    Dispense:  1 each    Refill:  0    IM Program   sertraline (ZOLOFT) 25 MG tablet    Sig: Take 1 tablet (25 mg total) by mouth daily.    Dispense:  30 tablet    Refill:  1    IM Program   oxyCODONE-acetaminophen (PERCOCET) 5-325 MG tablet    Sig: Take 1 tablet by mouth every 4 (four) hours as needed for up to 3 days for severe pain.    Dispense:  18 tablet    Refill:  0   polyethylene glycol (MIRALAX / GLYCOLAX) packet 17 g   docusate sodium (COLACE) 100 MG capsule    Sig: Take 1 capsule (100 mg total) by mouth daily.    Dispense:  30 capsule    Refill:  1      Follow up: 3 months    Remember:     Should you have any questions or concerns please call the internal medicine clinic at [phone].     Johny Blamer, [place]

## 2022-06-08 NOTE — Assessment & Plan Note (Signed)
Blood pressure 113/73 today.  Patient was discharged from the hospital on amlodipine 10 mg daily, carvedilol 12.5 mg twice a day, and hydralazine 25 mg twice a day. - We will continue these medications today

## 2022-06-08 NOTE — Telephone Encounter (Signed)
Mrs Amy called again .Marland Kitchen Requesting the same order I read what the note stated back to her she stated that he did not   make it in and she did not call the office lauren advised .Marland Kitchen And because he is coming in today she was calling to get the orders I explained pt appt is at 330  she needs to call back after the appt she stated she is  all over the place and will forget  so she wants it now  and if  lauren can call her and make sure pt gets home health orders .Marland Kitchen I explained again order will be placed if needed by PCP and if  he shows up to his Appt

## 2022-06-08 NOTE — Progress Notes (Unsigned)
   CC: Hospital follow-up and establish care  HPI:  [name] is a 51 y.o. male with history as below who presents for hospital follow-up after being admitted from 05/26/2022 to 06/04/2022 with sepsis and right AKA wound infection.  Please see encounters tab for problem-based charting.  Past Medical History:  Diagnosis Date   Hypertension    Peripheral vascular disease ([place])    Tobacco abuse    Review of Systems:   Endorses constipation and intermittent nighttime fecal incontinence. Denies headache, fever, chills, nausea, vomiting, chest pain, shortness of breath, cough, abdominal pain, diarrhea, dark or tarry stools, blood in his stool, dysuria, hematuria, weakness in any of the extremities.  Physical Exam:  Vitals:   06/08/22 1540  BP: 113/73  Pulse: 87  Temp: 98.3 F (36.8 C)  TempSrc: Oral  SpO2: 100%   Constitutional: Elderly male with bilateral AKA sitting in wheelchair. In no acute distress. HENT: Normocephalic, atraumatic Eyes: Sclera nonicteric, EOM intact Cardio:Regular rate and rhythm. No murmurs, rubs, or gallops.  2+ bilateral radial pulses Pulm: Mild diffuse wheezing throughout lung fields. Normal work of breathing on room air. Abdomen: Soft, nontender, nondistended, positive bowel sounds MSK: Status post bilateral AKA.  Left stump with well-healed surgical wounds and no edema.  Right stump with wound VAC and dressing in place without purulent drainage or surrounding edema, erythema, or pain. Skin:Warm and dry. Neuro:Alert and oriented x3. No focal deficit noted. Psych:Pleasant mood and affect.   Assessment & Plan:   See Encounters Tab for problem based charting.  Patient seen with Dr. Philipp Ovens

## 2022-06-09 DIAGNOSIS — K5900 Constipation, unspecified: Secondary | ICD-10-CM | POA: Insufficient documentation

## 2022-06-09 DIAGNOSIS — F32A Depression, unspecified: Secondary | ICD-10-CM | POA: Insufficient documentation

## 2022-06-09 DIAGNOSIS — Z89611 Acquired absence of right leg above knee: Secondary | ICD-10-CM | POA: Insufficient documentation

## 2022-06-09 LAB — CBC WITH DIFFERENTIAL/PLATELET
Basophils Absolute: 0 10*3/uL (ref 0.0–0.2)
Basos: 0 %
EOS (ABSOLUTE): 0.1 10*3/uL (ref 0.0–0.4)
Eos: 1 %
Hematocrit: 35.6 % — ABNORMAL LOW (ref 37.5–51.0)
Hemoglobin: 11.9 g/dL — ABNORMAL LOW (ref 13.0–17.7)
Immature Grans (Abs): 0 10*3/uL (ref 0.0–0.1)
Immature Granulocytes: 0 %
Lymphocytes Absolute: 2.9 10*3/uL (ref 0.7–3.1)
Lymphs: 28 %
MCH: 28.1 pg (ref 26.6–33.0)
MCHC: 33.4 g/dL (ref 31.5–35.7)
MCV: 84 fL (ref 79–97)
Monocytes Absolute: 0.7 10*3/uL (ref 0.1–0.9)
Monocytes: 6 %
Neutrophils Absolute: 6.5 10*3/uL (ref 1.4–7.0)
Neutrophils: 65 %
Platelets: 394 10*3/uL (ref 150–450)
RBC: 4.23 x10E6/uL (ref 4.14–5.80)
RDW: 15.1 % (ref 11.6–15.4)
WBC: 10.2 10*3/uL (ref 3.4–10.8)

## 2022-06-09 NOTE — Assessment & Plan Note (Signed)
Please see depression.

## 2022-06-09 NOTE — Assessment & Plan Note (Addendum)
Patient discharged in the hospital on sertraline 25 mg daily without any complaints today.  During hospitalization he had acute metabolic encephalopathy that resolved with treatment of sepsis.  CT of the head on 05/27/2022 showed no acute abnormalities with chronic ischemic changes in the white matter, basal ganglia, pons, and cerebellum.  Per patient and family he is at baseline during today's visit.  He would like to continue this medication.  We will continue to screen for anxiety depression and future visits. - Continue sertraline 25 mg daily

## 2022-06-09 NOTE — Assessment & Plan Note (Signed)
Patient notes some intermittent fecal incontinence at night.  Of note during his hospitalization he was found to be impacted on a CT scan.  He is not on any bowel regimen.  Denies any abdominal pain or dark/bloody stools.  We will start him on MiraLAX and docusate daily.

## 2022-06-09 NOTE — Assessment & Plan Note (Signed)
Discussed his smoking habits and patient denies any interest in cessation at this time.  Counseled on the particular concerns with his peripheral artery disease and hypertension and the fact that continued smoking has on these diseases.  Also counseled on the never present resource of our clinic for help with smoking cessation when and if he is interested.  Patient states understanding.

## 2022-06-09 NOTE — Assessment & Plan Note (Signed)
No complaints today and no signs or symptoms of blood loss.  Repeat CBC today shows continued recovery of hemoglobin at 11.9.  We will continue to monitor.

## 2022-06-09 NOTE — Assessment & Plan Note (Addendum)
Starting in 03/2022 patient had a decline in renal function which had a nadir of a GFR of 22.  Fortunately his renal function slowly improved and by the time he was admitted to the hospital on 05/26/2022 his renal function was back to baseline with a GFR above 60.  No issues with urination or with any flank pain.  Most recent labs from 06/02/2022 remain stable with a GFR above 60.  We will continue to monitor.

## 2022-06-09 NOTE — Assessment & Plan Note (Signed)
No complaints of this today and he continues to work with PT and OT at home.  We will continue to monitor this at future visits.

## 2022-06-09 NOTE — Assessment & Plan Note (Signed)
Patient with healing wound on his anterior right groin that previous had a wound VAC.  He has no complaints about this today.  He is also using nystatin cream for prevention of wounds on his buttocks.

## 2022-06-10 NOTE — Progress Notes (Signed)
Call the patient to discuss the results of his CBC.  His hemoglobin is continue to show recovery.  No changes to management at this time.  All questions were answered.

## 2022-06-10 NOTE — Progress Notes (Signed)
Internal Medicine Clinic Attending  I saw and evaluated the patient.  I personally confirmed the key portions of the history and exam documented by Dr. Goodwin and I reviewed pertinent patient test results.  The assessment, diagnosis, and plan were formulated together and I agree with the documentation in the resident's note.  

## 2022-06-17 ENCOUNTER — Other Ambulatory Visit (HOSPITAL_COMMUNITY): Payer: Self-pay

## 2022-06-21 ENCOUNTER — Other Ambulatory Visit (HOSPITAL_COMMUNITY): Payer: Self-pay

## 2022-06-23 ENCOUNTER — Telehealth: Payer: Self-pay | Admitting: *Deleted

## 2022-06-23 NOTE — Telephone Encounter (Addendum)
Received call from Amy, RN with [facility]. Requesting Attending to write for barrier cream to buttocks and antifungal powder to groin area. States when she arrives at patient's home he is often sitting on wet chux or in stool. Is unsure if patient is truly incontinent or uninterested in caring for self. There are other adults in home who do not provide care to patient. He is currently applying for MCD.  Amy will send order for signature.

## 2022-06-23 NOTE — Telephone Encounter (Signed)
Agree 

## 2022-06-28 ENCOUNTER — Other Ambulatory Visit (HOSPITAL_COMMUNITY): Payer: Self-pay

## 2022-06-29 ENCOUNTER — Ambulatory Visit (INDEPENDENT_AMBULATORY_CARE_PROVIDER_SITE_OTHER): Payer: Medicaid Other | Admitting: Vascular Surgery

## 2022-06-29 ENCOUNTER — Encounter: Payer: Self-pay | Admitting: Vascular Surgery

## 2022-06-29 VITALS — BP 121/74 | HR 87 | Temp 97.4°F | Resp 14

## 2022-06-29 DIAGNOSIS — Z89612 Acquired absence of left leg above knee: Secondary | ICD-10-CM

## 2022-06-29 DIAGNOSIS — Z95828 Presence of other vascular implants and grafts: Secondary | ICD-10-CM

## 2022-06-29 DIAGNOSIS — Z89611 Acquired absence of right leg above knee: Secondary | ICD-10-CM

## 2022-06-29 DIAGNOSIS — I70223 Atherosclerosis of native arteries of extremities with rest pain, bilateral legs: Secondary | ICD-10-CM

## 2022-06-29 NOTE — Progress Notes (Signed)
Patient name: Jeffrey Fitzgerald MRN: [id number] DOB: August 02, 1971 Sex: male  REASON FOR CONSULT: Post-op check  HPI: Jeffrey Fitzgerald is a 51 y.o. male, that presents for postop check.  He has a complex history.  On 04/07/2022 he underwent aortobifemoral bypass with bilateral common femoral endarterectomies and a left common femoral to PT bypass with PTFE for critical limb ischemia with tissue loss.  Ultimately he required going back to the OR for thrombectomy of the right limb and profundoplasty later that evening.  On 04/08/2022 he underwent a right common femoral to PT bypass.  On 04/09/2022 he had to undergo thrombectomy of left leg bypass.  He then on 04/20/2022 had a right groin bleed that required washout and sartorius muscle flap.  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  He is most recently had a right AKA debridement with VAC on 05/31/2022.  He is at home.  Feels the wounds are healing.  No new concerns.  No fevers or chills.  Past Medical History:  Diagnosis Date   Hypertension    Peripheral vascular disease (HCC)    Tobacco abuse     Past Surgical History:  Procedure Laterality Date   ABDOMINAL AORTOGRAM W/LOWER EXTREMITY N/A 04/01/2022   Procedure: ABDOMINAL AORTOGRAM LOWER EXTREMITY [place];  Surgeon: Cephus Shelling, MD;  [facility]: [facility];  Service: Cardiovascular;  Laterality: N/A;   AMPUTATION Right 04/22/2022   Procedure: RIGHT ABOVE KNEE AMPUTATION WITH WOUND VAC SPONGE CHANGEOUT TO RIGHT GROIN;  Surgeon: Nada Libman, MD;  Location: [facility] OR;  Service: Vascular;  Laterality: Right;   AMPUTATION Left 04/30/2022   Procedure: AMPUTATION ABOVE KNEE;  Surgeon: Maeola Harman, MD;  Location: [facility] OR;  Service: Vascular;  Laterality: Left;   AORTA - BILATERAL FEMORAL ARTERY BYPASS GRAFT Bilateral 04/07/2022   Procedure: AORTA BIFEMORAL BYPASS WITH LEFT LEG BYPASS;  Surgeon: Cephus Shelling, MD;  Location: [facility] OR;  Service:  Vascular;  Laterality: Bilateral;   APPLICATION OF WOUND VAC Right 04/08/2022   Procedure: APPLICATION OF WOUND VAC;  Surgeon: Leonie Douglas, MD;  Location: [facility] OR;  Service: Vascular;  Laterality: Right;   APPLICATION OF WOUND VAC Right 04/20/2022   Procedure: APPLICATION OF WOUND VAC;  Surgeon: Maeola Harman, MD;  Location: [facility] OR;  Service: Vascular;  Laterality: Right;   APPLICATION OF WOUND VAC Right 05/31/2022   Procedure: RIGHT GROIN WOUND VAC CHANGE;  Surgeon: Victorino Sparrow, MD;  Location: [facility] OR;  Service: Vascular;  Laterality: Right;   FEMORAL ARTERY EXPLORATION Right 04/20/2022   Procedure: EXPLORATION OF RIGHT GROIN;  Surgeon: Maeola Harman, MD;  Location: [facility] OR;  Service: Vascular;  Laterality: Right;   FEMORAL-POPLITEAL BYPASS GRAFT Left 04/07/2022   Procedure: LEFT LEG BYPASS GRAFT FEMORAL-POPLITEAL ARTERY;  Surgeon: Cephus Shelling, MD;  Location: [facility] OR;  Service: Vascular;  Laterality: Left;   FEMORAL-TIBIAL BYPASS GRAFT Right 04/08/2022   Procedure: GREATER SAPHENOUS VEIN HARVEST, RIGHT LEG BYPASS GRAFT RIGHT COMMON FEMORAL-TIBIAL ARTERY WITH COMPOSITE GRAFT;  Surgeon: Leonie Douglas, MD;  [facility];  Service: Vascular;  Laterality: Right;   INCISION AND DRAINAGE OF WOUND Right 05/31/2022   Procedure: IRRIGATION AND DEBRIDEMENT RIGHT ABOVE KNEE AMPUTAION;  Surgeon: Victorino Sparrow, MD;  Location: [facility] OR;  Service: Vascular;  Laterality: Right;   LOWER EXTREMITY ANGIOGRAM Right 04/08/2022   Procedure: LOWER EXTREMITY ANGIOGRAM;  Surgeon: Leonie Douglas, MD;  [facility];  Service: Vascular;  Laterality: Right;   PATCH ANGIOPLASTY Right 04/07/2022   Procedure: PATCH ANGIOPLASTY USING Livia Snellen BIOLOGIC;  Surgeon: Cephus Shelling, MD;  Location: [facility] OR;  Service: Vascular;  Laterality: Right;   THIGH FASCIOTOMY Right 04/08/2022   Procedure: FASCIOTOMIES;  Surgeon: Leonie Douglas, MD;  Location: [facility] OR;  Service: Vascular;  Laterality: Right;    THROMBECTOMY FEMORAL ARTERY Right 04/07/2022   Procedure: RIGHT FEMORAL ARTERY THROMBECTOMY;  Surgeon: Cephus Shelling, MD;  Location: [facility] OR;  Service: Vascular;  Laterality: Right;   THROMBECTOMY FEMORAL ARTERY Right 04/08/2022   Procedure: THROMBECTOMY POSTERIOR TIBIAL;  Surgeon: Leonie Douglas, MD;  [facility];  Service: Vascular;  Laterality: Right;   THROMBECTOMY FEMORAL ARTERY Bilateral 04/09/2022   Procedure: LEFT  THROMBECTOMY FEMORAL POSTERIOR TIBIAL BYPASS AND RIGHT LATERAL  LOWER EXTREMITY FASCIOTOMY CLOSURE  AND WOUND VAC PLACEMENT;  Surgeon: Cephus Shelling, MD;  Location: [facility] OR;  Service: Vascular;  Laterality: Bilateral;   WOUND DEBRIDEMENT Right 04/20/2022   Procedure: IRRIGATION AND DEBRIDMENT OF LOWER LEG AND APPLICATION WOUND VAC GROIN;  Surgeon: Victorino Sparrow, MD;  Location: [facility] OR;  Service: Vascular;  Laterality: Right;   WOUND DEBRIDEMENT Right 04/20/2022   Procedure: DEBRIDEMENT WOUND, repaired anstamosis femoral artery and creation of sartorious flap.;  Surgeon: Maeola Harman, MD;  Location: [facility] OR;  Service: Vascular;  Laterality: Right;    Family History  Problem Relation Age of Onset   Hypertension Father    Heart disease Father        No details.  Died in his earlies 3s and might have had heart attacks or coronary disease prior to that.   Cancer Paternal Uncle    Vascular Disease Paternal Uncle     SOCIAL HISTORY: Social History   Socioeconomic History   Marital status: Single    Spouse name: Not on file   Number of children: Not on file   Years of education: Not on file   Highest education level: Not on file  Occupational History   Not on file  Tobacco Use   Smoking status: Every Day    Packs/day: 0.50    Years: 30.00    Total pack years: 15.00    Types: Cigarettes   Smokeless tobacco: Never  Vaping Use   Vaping Use: Never used  Substance and Sexual Activity   Alcohol use: Not Currently    Comment: 04/06/22- 1 LIter a  week; none since last hospitalization   Drug use: Not Currently    Types: Marijuana   Sexual activity: Not on file  Other Topics Concern   Not on file  Social History Narrative   He is currently living with his mom.  He still smokes cigarettes.  Used to work in a [employer] .   Social Determinants of Health   Financial Resource Strain: Not on file  Food Insecurity: No Food Insecurity (06/03/2022)   Hunger Vital Sign    Worried About Running Out of Food in the Last Year: Never true    Ran Out of Food in the Last Year: Never true  Transportation Needs: No Transportation Needs (06/03/2022)   PRAPARE - Administrator, Civil Service (Medical): No    Lack of Transportation (Non-Medical): No  Physical Activity: Not on file  Stress: Not on file  Social Connections: Not on file  Intimate Partner Violence: Not At Risk (06/03/2022)   Humiliation, Afraid, Rape, and Kick questionnaire    Fear of Current  or Ex-Partner: No    Emotionally Abused: No    Physically Abused: No    Sexually Abused: No    No Known Allergies  Current Outpatient Medications  Medication Sig Dispense Refill   acetaminophen (TYLENOL) 500 MG tablet Take 1 tablet (500 mg total) by mouth every 6 (six) hours as needed for moderate pain. 30 tablet 3   amLODipine (NORVASC) 10 MG tablet Take 1 tablet (10 mg total) by mouth daily. 30 tablet 1   aspirin EC 81 MG tablet Take 1 tablet (81 mg total) by mouth daily. Swallow whole. 30 tablet 12   atorvastatin (LIPITOR) 80 MG tablet Take 1 tablet (80 mg total) by mouth daily. 30 tablet 1   carvedilol (COREG) 12.5 MG tablet Take 1 tablet (12.5 mg total) by mouth 2 (two) times daily with a meal. 60 tablet 0   docusate sodium (COLACE) 100 MG capsule Take 1 capsule (100 mg total) by mouth daily. 30 capsule 1   hydrALAZINE (APRESOLINE) 25 MG tablet Take 1 tablet (25 mg total) by mouth 2 (two) times daily. 60 tablet 1   Nystatin (GERHARDT'S BUTT CREAM) CREA Apply 1 Application  topically as needed for irritation (apply to periarea as needed for excoriation). 1 each 0   sertraline (ZOLOFT) 25 MG tablet Take 1 tablet (25 mg total) by mouth daily. 30 tablet 1   Current Facility-Administered Medications  Medication Dose Route Frequency Provider Last Rate Last Admin   polyethylene glycol (MIRALAX / GLYCOLAX) packet 17 g  17 g Oral Daily [name], DO        REVIEW OF SYSTEMS:  [X]  denotes positive finding, [ ]  denotes negative finding Cardiac  Comments:  Chest pain or chest pressure:    Shortness of breath upon exertion:    Short of breath when lying flat:    Irregular heart rhythm:        Vascular    Pain in calf, thigh, or hip brought on by ambulation:    Pain in feet at night that wakes you up from your sleep:     Blood clot in your veins:    Leg swelling:         Pulmonary    Oxygen at home:    Productive cough:     Wheezing:         Neurologic    Sudden weakness in arms or legs:     Sudden numbness in arms or legs:     Sudden onset of difficulty speaking or slurred speech:    Temporary loss of vision in one eye:     Problems with dizziness:         Gastrointestinal    Blood in stool:     Vomited blood:         Genitourinary    Burning when urinating:     Blood in urine:        Psychiatric    Major depression:         Hematologic    Bleeding problems:    Problems with blood clotting too easily:        Skin    Rashes or ulcers:        Constitutional    Fever or chills:      PHYSICAL EXAM: Vitals:   06/29/22 1122  BP: 121/74  Pulse: 87  Resp: 14  Temp: (!) 97.4 F (36.3 C)  TempSrc: Temporal  SpO2: 99%    GENERAL: The patient is a  well-nourished male, in no acute distress. The vital signs are documented above. CARDIAC: There is a regular rate and rhythm.  VASCULAR:  Midline abdominal incision is healed Right groin incision with VAC has excellent granulation tissue over the muscle flap Bilateral femoral pulses  palpable Left groin incision healed Left AKA healed.  Right AKA with good granulation tissue with a VAC in place       DATA:   None  Assessment/Plan:  51 yo M with complex history that presents for post-op check.  On 04/07/2022 he underwent aortobifemoral bypass with bilateral common femoral endarterectomies and a left common femoral to PT bypass with PTFE for critical limb ischemia with tissue loss.  Ultimately he required going back to the OR for thrombectomy of the right limb of the aortobifemoral bypass later that evening with profundoplasty.  On 04/08/2022 he underwent a right common femoral to PT bypass.  On 04/09/2022 he had to undergo thrombectomy of left leg bypass.  He then on 04/20/2022 had a right groin bleed that required washout and muscle flap.  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  He is most recently had a right AKA debridement with VAC on 05/31/2022.  Overall he looks very good today.  We removed his VAC to the right groin muscle flap and right AKA that recently required debridement.  Both of these are healing and look excellent with good granulation tissue.  Discussed with his sister we will put him back in a VAC for at least the next week or two given that this is keeping the wounds clean and facilitating healing at a much faster pace.  I will have him return for a wound check in the [facility] clinic in 2 weeks.  Prior to his aortobifemoral bypass he did not have inflow to heal amputations.  We did make every effort to perform limb salvage.   Marty Heck, MD [facility] [place] Office: [phone]

## 2022-06-30 ENCOUNTER — Other Ambulatory Visit (HOSPITAL_COMMUNITY): Payer: Self-pay

## 2022-07-06 ENCOUNTER — Other Ambulatory Visit (HOSPITAL_COMMUNITY): Payer: Self-pay

## 2022-07-06 ENCOUNTER — Telehealth: Payer: Self-pay | Admitting: Student

## 2022-07-06 NOTE — Telephone Encounter (Signed)
Returned call to Amy, Therapist, sports with Jeffrey Fitzgerald. Requesting VO for [facility] OT to help with upper body strength; currently he is unable to turn himself. And HH SW Eval to work on placement. Verbal auth given. Will route to [facility] for agreement/denial.  Also, requesting PCS since placement may take awhile. Lives with sister who works from home but is unable to care for him. His elderly mom and elderly aunt visit to cook and clean him but they need help.  Of note, wounds are doing well. Will be ready to remove wound vac soon.

## 2022-07-06 NOTE — Telephone Encounter (Signed)
Amy AnnGrave from [place] requesting Vo for a SW.  Family Is requesting possible Rehab admission.  PLease call back to [phone]

## 2022-07-13 ENCOUNTER — Ambulatory Visit (INDEPENDENT_AMBULATORY_CARE_PROVIDER_SITE_OTHER): Payer: Medicaid Other | Admitting: Physician Assistant

## 2022-07-13 ENCOUNTER — Telehealth: Payer: Self-pay

## 2022-07-13 VITALS — BP 132/80 | HR 77 | Temp 98.1°F | Resp 20

## 2022-07-13 DIAGNOSIS — I739 Peripheral vascular disease, unspecified: Secondary | ICD-10-CM

## 2022-07-13 NOTE — Progress Notes (Signed)
POST OPERATIVE OFFICE NOTE    CC:  F/u for surgery  HPI:  Jeffrey Fitzgerald is a 51 y.o. male with an extensive vascular history.  On 04/07/2022 he underwent annual bifemoral bypass with bilateral common femoral endarterectomies and a left common femoral to PT bypass with PTFE for critical limb ischemia with tissue loss.  He then required going back to the OR for thrombectomy of the right limb and profundoplasty later that evening.  On 04/08/2022 he underwent a right common femoral to PT bypass.  He then required thrombectomy of the left leg bypass on 04/09/2022.  On 04/20/2022, he developed a right groin bleed that required washout and sartorius muscle flap.  Ultimately the patient underwent right AKA on 04/22/2022 and left AKA on 04/30/2022.  He most recently had a right AKA debridement with VAC placement on 05/31/2022.  At his most recent follow-up check on 06/29/2022, he still had wound VAC therapy to his right groin and right AKA stump.  His wounds were healing well and he was to follow-up with [place] again in 2 weeks for wound check.  At follow-up today, he reports things have been going well. HHRN comes to visit him 3 times weekly for wound VAC changes.  Yesterday they visited him and took off the wound VAC and discontinued his VAC therapy since his wounds have greatly decreased in size. His R groin and R AKA wounds are healing well without drainage or erythema. He denies any fevers.   He has recently been approved for Medicaid and will hopefully be going to a rehab facility soon.   No Known Allergies  Current Outpatient Medications  Medication Sig Dispense Refill   acetaminophen (TYLENOL) 500 MG tablet Take 1 tablet (500 mg total) by mouth every 6 (six) hours as needed for moderate pain. 30 tablet 3   amLODipine (NORVASC) 10 MG tablet Take 1 tablet (10 mg total) by mouth daily. 30 tablet 1   aspirin EC 81 MG tablet Take 1 tablet (81 mg total) by mouth daily. Swallow whole. 30 tablet 12    atorvastatin (LIPITOR) 80 MG tablet Take 1 tablet (80 mg total) by mouth daily. 30 tablet 1   carvedilol (COREG) 12.5 MG tablet Take 1 tablet (12.5 mg total) by mouth 2 (two) times daily with a meal. 60 tablet 0   docusate sodium (COLACE) 100 MG capsule Take 1 capsule (100 mg total) by mouth daily. 30 capsule 1   hydrALAZINE (APRESOLINE) 25 MG tablet Take 1 tablet (25 mg total) by mouth 2 (two) times daily. 60 tablet 1   Nystatin (GERHARDT'S BUTT CREAM) CREA Apply 1 Application topically as needed for irritation (apply to periarea as needed for excoriation). 1 each 0   sertraline (ZOLOFT) 25 MG tablet Take 1 tablet (25 mg total) by mouth daily. 30 tablet 1   Current Facility-Administered Medications  Medication Dose Route Frequency Provider Last Rate Last Admin   polyethylene glycol (MIRALAX / GLYCOLAX) packet 17 g  17 g Oral Daily [name], DO         ROS:  See HPI  Physical Exam:  Incision:  R groin and R AKA wounds with healthy granulation tissue. No signs of tissue infection or ischemia Extremities:  healed L AKA Neuro: intact Abdomen:  soft, NT        Assessment/Plan:  This is a 51 y.o. male who is here for R groin and R AKA wound check   -His wound vac therapy has been discontinued since the  wounds are small enough to convert to wet-to-dry dressings -R groin and R AKA wounds healing appropriately and decreasing in size. Both wounds have healthy granulation tissue with no signs of infection or ischemia -He can be converted to 1x daily wet to dry gauze dressing changes. HHRN will visit 3x weekly for dressing changes and the patient and his family will do dressing changes on the other days -He will come back in 3 weeks with repeat wound check   Jeffrey Dubonnet, PA-C [facility] [phone]   Clinic MD:  Jeffrey Fitzgerald

## 2022-07-13 NOTE — Telephone Encounter (Signed)
Amy with [facility] called stating that the pt was having a f/u visit today and wanted a call with new wound care orders. She stated that she felt that the wound was small enough that the vac would most likely be d/c'd.  Shon Millet, CMA, reported that the pt's wound care orders were wet-to-dry dressings 3 x weekly, wound vac d/c'd, and family was taught how to apply and change dressings.  Called Amy and reported the new wound care orders. She stated that d/t the area surrounding the wound and other limiting factors with the pt, she was asking for the addition of silver alginate to the wound bed.  Spoke with McKenzi, PA who approved the use of silver alginate. Called Amy and gave order. Confirmed understanding.

## 2022-07-14 NOTE — Telephone Encounter (Signed)
PCS only requires an OV w/i the last 90 days. Patient saw PCP on 06/08/22. PCP will be back in the office on 11/20 and can complete paperwork at that time.

## 2022-07-15 ENCOUNTER — Telehealth: Payer: Self-pay | Admitting: *Deleted

## 2022-07-15 NOTE — Telephone Encounter (Signed)
Incoming call from cindy / [facility] -[phone] / requesting pcs. Patient seen 06-08-2022 internal medicine clinic / no up coming appointments.

## 2022-07-19 ENCOUNTER — Telehealth: Payer: Self-pay | Admitting: *Deleted

## 2022-07-19 NOTE — Telephone Encounter (Signed)
Paper work for Jeffrey Fitzgerald placed on desk  for Dr. Geraldo Pitter to complete. Patient last seen 07-15-2022.

## 2022-07-20 ENCOUNTER — Other Ambulatory Visit: Payer: Self-pay | Admitting: Student

## 2022-07-20 DIAGNOSIS — Z89611 Acquired absence of right leg above knee: Secondary | ICD-10-CM

## 2022-07-21 ENCOUNTER — Telehealth: Payer: Self-pay | Admitting: *Deleted

## 2022-07-21 NOTE — Telephone Encounter (Signed)
Spoke with patient regarding his PCS forms / they have been faxed to [facility]ie [facility] / [place] for review and scheduling of the in home nurse visit. Patient is to contact our office with the date and time.  [phone] / fax [phone].

## 2022-07-26 ENCOUNTER — Other Ambulatory Visit (HOSPITAL_COMMUNITY): Payer: Self-pay

## 2022-07-28 ENCOUNTER — Encounter: Payer: Self-pay | Admitting: Licensed Clinical Social Worker

## 2022-08-02 ENCOUNTER — Encounter: Payer: Self-pay | Admitting: Student

## 2022-08-02 NOTE — Progress Notes (Unsigned)
POST OPERATIVE OFFICE NOTE    CC:  F/u for surgery  HPI:  This is a 51 y.o. male with a complex history.   On 04/07/2022 he underwent aortobifemoral bypass with bilateral common femoral endarterectomies and a left common femoral to PT bypass with PTFE for critical limb ischemia with tissue loss.  Ultimately he required going back to the OR for thrombectomy of the right limb and profundoplasty later that evening.  On 04/08/2022 he underwent a right common femoral to PT bypass.  On 04/09/2022 he had to undergo thrombectomy of left leg bypass.  He then on 04/20/2022 had a right groin bleed that required washout and sartorius muscle flap.  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  He is most recently had a right AKA debridement with VAC on 05/31/2022.   Pt was last seen on 07/13/2022 and at that time, the vacs had been discontinued due to the wounds being significantly decreased in size.  He was scheduled to return for wound check today.   Pt returns today for follow up.   Pt states ***   No Known Allergies  Current Outpatient Medications  Medication Sig Dispense Refill   acetaminophen (TYLENOL) 500 MG tablet Take 1 tablet (500 mg total) by mouth every 6 (six) hours as needed for moderate pain. 30 tablet 3   amLODipine (NORVASC) 10 MG tablet Take 1 tablet (10 mg total) by mouth daily. 30 tablet 1   aspirin EC 81 MG tablet Take 1 tablet (81 mg total) by mouth daily. Swallow whole. 30 tablet 12   atorvastatin (LIPITOR) 80 MG tablet Take 1 tablet (80 mg total) by mouth daily. 30 tablet 1   carvedilol (COREG) 12.5 MG tablet Take 1 tablet (12.5 mg total) by mouth 2 (two) times daily with a meal. 60 tablet 0   docusate sodium (COLACE) 100 MG capsule Take 1 capsule (100 mg total) by mouth daily. 30 capsule 1   hydrALAZINE (APRESOLINE) 25 MG tablet Take 1 tablet (25 mg total) by mouth 2 (two) times daily. 60 tablet 1   Nystatin (GERHARDT'S BUTT CREAM) CREA Apply 1 Application  topically as needed for irritation (apply to periarea as needed for excoriation). 1 each 0   sertraline (ZOLOFT) 25 MG tablet Take 1 tablet (25 mg total) by mouth daily. 30 tablet 1   Current Facility-Administered Medications  Medication Dose Route Frequency Provider Last Rate Last Admin   polyethylene glycol (MIRALAX / GLYCOLAX) packet 17 g  17 g Oral Daily Johny Blamer, DO         ROS:  See HPI  Physical Exam:  ***  Incision:  *** Extremities:  *** Neuro: *** Abdomen:  ***    Assessment/Plan:  This is a 51 y.o. male who is s/p: with a complex history.   On 04/07/2022 he underwent aortobifemoral bypass with bilateral common femoral endarterectomies and a left common femoral to PT bypass with PTFE for critical limb ischemia with tissue loss.  Ultimately he required going back to the OR for thrombectomy of the right limb and profundoplasty later that evening.  On 04/08/2022 he underwent a right common femoral to PT bypass.  On 04/09/2022 he had to undergo thrombectomy of left leg bypass.  He then on 04/20/2022 had a right groin bleed that required washout and sartorius muscle flap.  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  He is most recently had a right  AKA debridement with VAC on 05/31/2022.   -***   Doreatha Massed, [facility] [phone]   Clinic MD:  Chestine Spore

## 2022-08-03 ENCOUNTER — Ambulatory Visit (INDEPENDENT_AMBULATORY_CARE_PROVIDER_SITE_OTHER): Payer: Medicaid Other | Admitting: Physician Assistant

## 2022-08-03 VITALS — BP 112/65 | HR 82 | Temp 97.6°F | Ht 65.0 in | Wt 125.0 lb

## 2022-08-03 DIAGNOSIS — I739 Peripheral vascular disease, unspecified: Secondary | ICD-10-CM

## 2022-08-03 DIAGNOSIS — Z89612 Acquired absence of left leg above knee: Secondary | ICD-10-CM

## 2022-08-03 DIAGNOSIS — Z95828 Presence of other vascular implants and grafts: Secondary | ICD-10-CM

## 2022-08-03 DIAGNOSIS — Z89611 Acquired absence of right leg above knee: Secondary | ICD-10-CM

## 2022-08-05 ENCOUNTER — Other Ambulatory Visit (HOSPITAL_COMMUNITY): Payer: Self-pay

## 2022-08-05 ENCOUNTER — Other Ambulatory Visit: Payer: Self-pay

## 2022-08-05 DIAGNOSIS — I739 Peripheral vascular disease, unspecified: Secondary | ICD-10-CM

## 2022-08-05 DIAGNOSIS — I10 Essential (primary) hypertension: Secondary | ICD-10-CM

## 2022-08-05 DIAGNOSIS — K5900 Constipation, unspecified: Secondary | ICD-10-CM

## 2022-08-05 DIAGNOSIS — F32A Depression, unspecified: Secondary | ICD-10-CM

## 2022-08-05 DIAGNOSIS — L89309 Pressure ulcer of unspecified buttock, unspecified stage: Secondary | ICD-10-CM

## 2022-08-05 MED ORDER — ACETAMINOPHEN 500 MG PO TABS: 500.0000 mg | ORAL_TABLET | Freq: Four times a day (QID) | ORAL | 3 refills | Status: DC | PRN

## 2022-08-05 MED ORDER — HYDRALAZINE HCL 25 MG PO TABS: 25.0000 mg | ORAL_TABLET | Freq: Two times a day (BID) | ORAL | 1 refills | Status: DC

## 2022-08-05 MED ORDER — AMLODIPINE BESYLATE 10 MG PO TABS: 10.0000 mg | ORAL_TABLET | Freq: Every day | ORAL | 1 refills | Status: DC

## 2022-08-05 MED ORDER — DOCUSATE SODIUM 100 MG PO CAPS: 100.0000 mg | ORAL_CAPSULE | Freq: Every day | ORAL | 1 refills | Status: DC

## 2022-08-05 MED ORDER — CARVEDILOL 12.5 MG PO TABS: 12.5000 mg | ORAL_TABLET | Freq: Two times a day (BID) | ORAL | 0 refills | Status: DC

## 2022-08-05 MED ORDER — SERTRALINE HCL 25 MG PO TABS: 25.0000 mg | ORAL_TABLET | Freq: Every day | ORAL | 1 refills | Status: DC

## 2022-08-05 MED ORDER — ATORVASTATIN CALCIUM 80 MG PO TABS: 80.0000 mg | ORAL_TABLET | Freq: Every day | ORAL | 1 refills | Status: DC

## 2022-08-05 MED ORDER — ASPIRIN 81 MG PO TBEC: 81.0000 mg | DELAYED_RELEASE_TABLET | Freq: Every day | ORAL | 12 refills | Status: DC

## 2022-08-05 MED ORDER — GERHARDT'S BUTT CREAM: 1.0000 | TOPICAL_CREAM | CUTANEOUS | 0 refills | Status: DC | PRN

## 2022-08-05 NOTE — Telephone Encounter (Signed)
Amy from [facility] called patient has switched pharmacies and is needing all of his medications refilled even the Asprin.  Pharmacy:[facility] - [place], [place] - [address]

## 2022-08-09 ENCOUNTER — Other Ambulatory Visit: Payer: Self-pay

## 2022-08-09 DIAGNOSIS — I745 Embolism and thrombosis of iliac artery: Secondary | ICD-10-CM

## 2022-08-09 DIAGNOSIS — I739 Peripheral vascular disease, unspecified: Secondary | ICD-10-CM

## 2022-08-09 DIAGNOSIS — I70223 Atherosclerosis of native arteries of extremities with rest pain, bilateral legs: Secondary | ICD-10-CM

## 2022-08-31 ENCOUNTER — Other Ambulatory Visit: Payer: Self-pay | Admitting: Student

## 2022-08-31 DIAGNOSIS — I10 Essential (primary) hypertension: Secondary | ICD-10-CM

## 2022-09-06 ENCOUNTER — Telehealth: Payer: Self-pay | Admitting: *Deleted

## 2022-09-06 NOTE — Telephone Encounter (Addendum)
Call from Amy from [place] needs to get Nursing orders for Nursing Care Hypertension and Hypotension , Med management x 4 weeks .Need OT orders for 9 weeks for completion of Transfer. Amy can be reached [phone].

## 2022-09-06 NOTE — Telephone Encounter (Signed)
RTC to Amy from [place].  Given ok to do visits and OT for patient.

## 2022-09-20 ENCOUNTER — Telehealth: Payer: Self-pay | Admitting: *Deleted

## 2022-09-20 NOTE — Telephone Encounter (Signed)
Received call from Amy, RN with [facility] . States she is discharging patient today from [place] as VS are excellent and wounds have healed. States HH OT will continue working with patient for transfers.   Patient was turned down for PCS as he has managed MCD. States only straight MCD will pay for PCS. Patient's family is requesting FL2 for placement. Transferred back to front office to schedule tele appt for FL2. Patient is due for 3 month in- person f/u, however, family is struggling with transportation and their own health issues.

## 2022-09-21 ENCOUNTER — Telehealth: Payer: Medicaid Other | Admitting: Student

## 2022-09-22 ENCOUNTER — Ambulatory Visit (INDEPENDENT_AMBULATORY_CARE_PROVIDER_SITE_OTHER): Payer: Medicaid Other | Admitting: Internal Medicine

## 2022-09-22 ENCOUNTER — Other Ambulatory Visit: Payer: Self-pay

## 2022-09-22 ENCOUNTER — Encounter: Payer: Self-pay | Admitting: Internal Medicine

## 2022-09-22 VITALS — BP 108/49 | HR 72 | Temp 98.2°F

## 2022-09-22 DIAGNOSIS — Z23 Encounter for immunization: Secondary | ICD-10-CM | POA: Diagnosis not present

## 2022-09-22 DIAGNOSIS — Z89612 Acquired absence of left leg above knee: Secondary | ICD-10-CM

## 2022-09-22 DIAGNOSIS — K432 Incisional hernia without obstruction or gangrene: Secondary | ICD-10-CM

## 2022-09-22 DIAGNOSIS — Z89611 Acquired absence of right leg above knee: Secondary | ICD-10-CM

## 2022-09-22 NOTE — Progress Notes (Signed)
   CC: LTC placement  HPI:[name] is a 52 y.o. male who presents for evaluation of AKA and LTC. Please see individual problem based A/P for details.  7 yom hx HTN, tobacco use, PAD with hx left occlusion and critical limb ischemia, underwent left AKA 04/18/22 and right AKA 04/30/22, depression and constipation   Depression, PHQ-9: Based on the patients  [place] Visit from 09/22/2022 in [facility]  PHQ-9 Total Score 1      score we have .  Past Medical History:  Diagnosis Date   Hypertension    Peripheral vascular disease ([place])    Tobacco abuse    Review of Systems:   See hpi  Physical Exam: Vitals:   09/22/22 1450  BP: (!) 108/49  Pulse: 72  Temp: 98.2 F (36.8 C)  TempSrc: Oral  SpO2: 100%   General: nad HEENT: Conjunctiva nl , antiicteric sclerae, moist mucous membranes, no exudate or erythema Cardiovascular: Normal rate, regular rhythm.  No murmurs, rubs, or gallops Pulmonary : Equal breath sounds, No wheezes, rales, or rhonchi Abdominal: Well healed midline incision with incisional hernia just superior to umbilicus. Easily reducible. Normal bowel sounds.  Ext: bilateral AKA  Assessment & Plan:   See Encounters Tab for problem based charting.  Patient discussed with Dr. Angelia Mould

## 2022-09-22 NOTE — Patient Instructions (Signed)
Dear Mr. Takeshita,  Thank you for trusting [place] with your care today.   We discussed your need for additional care at home. We will get working on the [facility] paperwork for you. I have placed a referral to our case manager to assist with this.   Your hernia is not something that requires treatment at this time. If you notice pain in the area or constipation, please have the hernia evaluated.   We will see you back in 3 months to check in on you.

## 2022-09-24 ENCOUNTER — Encounter: Payer: Self-pay | Admitting: Internal Medicine

## 2022-09-24 DIAGNOSIS — K469 Unspecified abdominal hernia without obstruction or gangrene: Secondary | ICD-10-CM | POA: Insufficient documentation

## 2022-09-24 NOTE — Assessment & Plan Note (Signed)
Patient had recent bilateral AKA. Was discharged to SNF after hospitalization and has been living with sister since discharge from SNF. Patient had applied for PCS , however, since he is managed MCD, insurance will not cover PCS. He has been relying heavily on his sister for care, but patient reports that she has her own health problems and his mother is 52 years old with her own health issues. He does have [place] RN through [employer], but nurse not availbe enough to provide the care he is requiring. Patient struggles to do many ADLs fr himself. Currently unable to transfer to toilet or into shower. Consequently has been wearing diaper which he will go to the bathroom in and then relies on sister to clean him off. He is not able to transfer into shower either so sister has been washing him down. Not able to get pants on without assistance and not able to cook for himself. For these reasons, due to large burden on caregivers, family is requesting placement in long-term care facility.  Will reach out to case management for assistance with long term placement.  Patient needs to continue to work with [facility] OT and utilize Therapist, sports in meantime.
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
## 2022-09-27 NOTE — Progress Notes (Signed)
Internal Medicine Clinic Attending  Case discussed with the resident at the time of the visit.  We reviewed the resident's history and exam and pertinent patient test results.  I agree with the assessment, diagnosis, and plan of care documented in the resident's note.  

## 2022-09-28 ENCOUNTER — Ambulatory Visit (INDEPENDENT_AMBULATORY_CARE_PROVIDER_SITE_OTHER): Payer: Medicaid Other | Admitting: Licensed Clinical Social Worker

## 2022-09-28 DIAGNOSIS — I10 Essential (primary) hypertension: Secondary | ICD-10-CM

## 2022-09-28 NOTE — BH Specialist Note (Signed)
Integrated Behavioral Health Initial In-Person Visit  MRN: [id number] Name: Jeffrey Fitzgerald  Number of [place] Clinician visits: 1- Initial Visit  Session Start time: 1500    Session End time: 3546  Total time in minutes: 30   Types of Service: Individual psychotherapy and Telephone visit     Warm Hand Off Completed.         Objective:  Risk of harm to self or others: No plan to harm self or others    Goals Addressed: Patient will: Reduce symptoms of: stress.  Progress towards Goals: Ongoing  Interventions: Interventions utilized: Supportive Counseling  Standardized Assessments completed: PHQ-SADS     09/28/2022    3:18 PM 09/22/2022    4:48 PM  PHQ-SADS Last 3 Score only  PHQ-15 Score 0   Total GAD-7 Score 0   PHQ Adolescent Score 0 1        Assessment: Patient currently experiencing patient is stressed and requesting to be placed in a living facility. [facility] will assist in [place] form and submit to provider.   Patient may benefit from assistance with placement and supportive counseling.  Plan: Follow up with behavioral health clinician on : [facility] will contact patient once Fl2 is complete.Fl2 placed in providers box.   Milus Height, MSW, [place]  Internal Medicine Center Direct Dial:[phone]  Fax [phone] Main Office Phone: [phone] [address] [place], [place] [address] Website: [place], [place]

## 2022-10-05 ENCOUNTER — Telehealth: Payer: Self-pay | Admitting: *Deleted

## 2022-10-05 NOTE — Telephone Encounter (Signed)
Spoke with patient regarding his request for PCS/ per patient have not had appointment for in home nurse visit/ instructed patient to contact Judithann Graves / [place] for status of this request [phone].

## 2022-10-06 ENCOUNTER — Telehealth: Payer: Self-pay | Admitting: Licensed Clinical Social Worker

## 2022-10-06 NOTE — Telephone Encounter (Signed)
[place] attempted all numbers on file. VM boxes are all full. FL2 forms are ready for pick up. [place] unable to leave VM. Forms are scanned into system and can be picked up.   Milus Height, MSW, [place]  Internal Medicine Center Direct Dial:[phone]  Fax [phone] Main Office Phone: [phone] [address] [place], [place] [address] Website: [place], [address]

## 2022-10-07 ENCOUNTER — Telehealth: Payer: Self-pay | Admitting: *Deleted

## 2022-10-07 NOTE — Telephone Encounter (Signed)
PCS formed (re faxed) to [facility] for review / scheduling for in home nurse visit. Ph-[phone] / fax [phone]. Will follow up with patient .

## 2022-10-14 ENCOUNTER — Other Ambulatory Visit: Payer: Self-pay | Admitting: Student

## 2022-10-14 DIAGNOSIS — I10 Essential (primary) hypertension: Secondary | ICD-10-CM

## 2022-10-14 DIAGNOSIS — I739 Peripheral vascular disease, unspecified: Secondary | ICD-10-CM

## 2022-10-14 DIAGNOSIS — K5900 Constipation, unspecified: Secondary | ICD-10-CM

## 2022-10-14 DIAGNOSIS — F32A Depression, unspecified: Secondary | ICD-10-CM

## 2022-10-15 ENCOUNTER — Other Ambulatory Visit: Payer: Self-pay | Admitting: Student

## 2022-10-15 DIAGNOSIS — I10 Essential (primary) hypertension: Secondary | ICD-10-CM

## 2022-10-19 ENCOUNTER — Telehealth: Payer: Self-pay | Admitting: *Deleted

## 2022-10-19 NOTE — Telephone Encounter (Signed)
Was unable to contact patient by phone- full/ spoke with Cecille Rubin- case manager regarding the status of this patient's PCS. Cecille Rubin to give me a call back as to when nurse assessment appointment and number of hour per month approved.

## 2022-10-28 ENCOUNTER — Telehealth: Payer: Self-pay | Admitting: *Deleted

## 2022-10-28 NOTE — Telephone Encounter (Signed)
Spoke with Jeffrey Fitzgerald (case manager [phone]) regarding [name] and status of his PCS request. They have had trouble getting in contact with the patient. Patient did finally contacted Jeffrey Fitzgerald today and she and co worker will be getting back in touch with him to get assessment scheduled. Jeffrey Fitzgerald will give me a call back with assessment date , etc.

## 2022-11-09 ENCOUNTER — Telehealth: Payer: Self-pay

## 2022-11-09 DIAGNOSIS — F4329 Adjustment disorder with other symptoms: Secondary | ICD-10-CM

## 2022-11-09 NOTE — Telephone Encounter (Signed)
Jeffrey Fitzgerald with [employer] requesting to speak with a nurse about PCS. States she did an evaluation on this pt today, had questions about his case. Please call her back @ [phone].
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
## 2022-11-10 NOTE — Telephone Encounter (Signed)
Who helps him with these tasks at home?

## 2022-11-10 NOTE — Telephone Encounter (Signed)
Returned call to [employer], with [employer]. No answer. Left message on VM requesting return call.

## 2022-11-11 NOTE — Telephone Encounter (Signed)
I don't know if he'd qualify for a SNF without a recent PT/OT evaluation

## 2022-11-11 NOTE — Addendum Note (Signed)
Addended by: Riesa Pope on: 11/11/2022 01:19 PM   Modules accepted: Orders

## 2022-11-11 NOTE — Telephone Encounter (Signed)
Referral placed.

## 2022-11-16 ENCOUNTER — Other Ambulatory Visit: Payer: Self-pay | Admitting: Student

## 2022-11-16 DIAGNOSIS — L89309 Pressure ulcer of unspecified buttock, unspecified stage: Secondary | ICD-10-CM

## 2022-11-16 DIAGNOSIS — I739 Peripheral vascular disease, unspecified: Secondary | ICD-10-CM

## 2022-11-16 DIAGNOSIS — I10 Essential (primary) hypertension: Secondary | ICD-10-CM

## 2022-11-16 DIAGNOSIS — F32A Depression, unspecified: Secondary | ICD-10-CM

## 2022-11-16 DIAGNOSIS — K5900 Constipation, unspecified: Secondary | ICD-10-CM

## 2022-11-23 ENCOUNTER — Telehealth: Payer: Self-pay

## 2022-11-23 NOTE — Telephone Encounter (Signed)
..   Medicaid Managed Care   Unsuccessful Outreach Note  [id number] Name: Jeffrey Fitzgerald MRN: [id number] DOB: July 04, 1971  Referred by: Johny Blamer, DO Reason for referral : Appointment (I tried to reach the patient today to get him scheduled with the MM Team. I was unable to leave a message as his VM was full.)   An unsuccessful telephone outreach was attempted today. The patient was referred to the case management team for assistance with care management and care coordination.   Follow Up Plan: The care management team will reach out to the patient again over the next 5 days.   [place]  [phone]

## 2022-11-23 NOTE — Telephone Encounter (Signed)
...   Medicaid Managed Care   Unsuccessful Outreach Note  [id number] Name: Jeffrey Fitzgerald MRN: [id number] DOB: 1970/10/26  Referred by: Johny Blamer, DO Reason for referral : Appointment   A second unsuccessful telephone outreach was attempted today. The patient was referred to the case management team for assistance with care management and care coordination.   Follow Up Plan: The care management team will reach out to the patient again over the next 3 days.   [place]  [phone]

## 2022-11-25 ENCOUNTER — Telehealth: Payer: Self-pay

## 2022-11-25 NOTE — Telephone Encounter (Signed)
..   Medicaid Managed Care   Unsuccessful Outreach Note  [id number] Name: Emzie Gokey MRN: [id number] DOB: 24-Dec-1970  Referred by: Johny Blamer, DO Reason for referral : Appointment   A second unsuccessful telephone outreach was attempted today. The patient was referred to the case management team for assistance with care management and care coordination.   Follow Up Plan: The care management team will reach out to the patient again over the next 5 days.   [place]  [phone]

## 2022-11-26 ENCOUNTER — Telehealth: Payer: Self-pay

## 2022-11-26 NOTE — Progress Notes (Signed)
..   Medicaid Managed Care   Unsuccessful Outreach Note  [id number] Name: Jeffrey Fitzgerald MRN: [id number] DOB: 1971-07-24  Referred by: Johny Blamer, DO Reason for referral : Appointment   Third unsuccessful telephone outreach was attempted today. The patient was referred to the case management team for assistance with care management and care coordination. The patient's primary care provider has been notified of our unsuccessful attempts to make or maintain contact with the patient. The care management team is pleased to engage with this patient at any time in the future should he/she be interested in assistance from the care management team.   Follow Up Plan: We have been unable to make contact with the patient for follow up. The care management team is available to follow up with the patient after provider conversation with the patient regarding recommendation for care management engagement and subsequent re-referral to the care management team.   [place]  [phone]

## 2022-12-07 ENCOUNTER — Ambulatory Visit (INDEPENDENT_AMBULATORY_CARE_PROVIDER_SITE_OTHER): Payer: Medicaid Other | Admitting: Licensed Clinical Social Worker

## 2022-12-07 DIAGNOSIS — F32A Depression, unspecified: Secondary | ICD-10-CM

## 2022-12-07 NOTE — BH Specialist Note (Signed)
Patient no-showed today's appointment; appointment was for Telephone visit.  Patient will need to reschedule appointment by calling Internal medicine center [phone].  Maisen Klingler, MSW, LCSW-A She/Her Behavioral Health Clinician [place]  Internal Medicine Center Direct Dial:[phone]  Fax [phone] Main Office Phone: [phone] [address] [place], [place] [address] Website: [place] [facility]  Primary Care  [place], [place]      

## 2022-12-22 ENCOUNTER — Telehealth: Payer: Self-pay

## 2022-12-22 NOTE — Telephone Encounter (Signed)
..   Medicaid Managed Care   Unsuccessful Outreach Note  12/22/2022 Name: Jeffrey Fitzgerald MRN: [id number] DOB: Apr 21, 1971  Referred by: [name], DO Reason for referral : Appointment (I called the patient today to get him scheduled for a phone visit with the MM Team. Patient did not answer and the voicemail is full.)   An unsuccessful telephone outreach was attempted today. The patient was referred to the case management team for assistance with care management and care coordination.   Follow Up Plan: The care management team will reach out to the patient again over the next 7 days.   Weston Settle Care Guide  [facility] Managed  [facility]  [phone]

## 2022-12-23 ENCOUNTER — Telehealth: Payer: Self-pay

## 2022-12-23 NOTE — Telephone Encounter (Signed)
..   Medicaid Managed Care   Unsuccessful Outreach Note  12/23/2022 Name: Jeffrey Fitzgerald MRN: [id number] DOB: 1971-04-22  Referred by: [name], DO Reason for referral : Appointment   A second unsuccessful telephone outreach was attempted today. The patient was referred to the case management team for assistance with care management and care coordination.   Follow Up Plan: The care management team will reach out to the patient again over the next 7 days.   Weston Settle Care Guide  [facility] Managed  [facility]  [phone]

## 2022-12-24 ENCOUNTER — Telehealth: Payer: Self-pay

## 2022-12-24 NOTE — Telephone Encounter (Signed)
..   Medicaid Managed Care   Unsuccessful Outreach Note  12/24/2022 Name: Jeffrey Fitzgerald MRN: [id number] DOB: [birth year]  Referred by: [name], DO Reason for referral : Appointment   Third unsuccessful telephone outreach was attempted today. The patient was referred to the case management team for assistance with care management and care coordination. The patient's primary care provider has been notified of our unsuccessful attempts to make or maintain contact with the patient. The care management team is pleased to engage with this patient at any time in the future should he/she be interested in assistance from the care management team.   Follow Up Plan: We have been unable to make contact with the patient for follow up. The care management team is available to follow up with the patient after provider conversation with the patient regarding recommendation for care management engagement and subsequent re-referral to the care management team.   Weston Settle Care Guide  [facility] Managed  [facility]  [phone]

## 2022-12-31 ENCOUNTER — Other Ambulatory Visit: Payer: Self-pay | Admitting: Student

## 2022-12-31 DIAGNOSIS — I739 Peripheral vascular disease, unspecified: Secondary | ICD-10-CM

## 2022-12-31 DIAGNOSIS — I10 Essential (primary) hypertension: Secondary | ICD-10-CM

## 2022-12-31 DIAGNOSIS — F32A Depression, unspecified: Secondary | ICD-10-CM

## 2022-12-31 DIAGNOSIS — K5900 Constipation, unspecified: Secondary | ICD-10-CM

## 2023-01-06 ENCOUNTER — Other Ambulatory Visit: Payer: Medicaid Other | Admitting: Licensed Clinical Social Worker

## 2023-01-06 NOTE — Patient Outreach (Signed)
error 

## 2023-01-07 NOTE — Patient Instructions (Signed)
Visit Information  Jeffrey Fitzgerald was given information about Medicaid Managed Care team care coordination services as a part of their [facility] Community Plan Medicaid benefit. Jeffrey Fitzgerald family verbally consented to engagement with the [facility] Managed Care team.   If you are experiencing a medical emergency, please call 911 or report to your local emergency department or urgent care.   If you have a non-emergency medical problem during routine business hours, please contact your provider's office and ask to speak with a nurse.   For questions related to your [facility], please call: [phone] or visit the homepage here: [email or web address]  If you would like to schedule transportation through your [facility], please call the following number at least 2 days in advance of your appointment: [phone]   Rides for urgent appointments can also be made after hours by calling Member Services.  Call the Behavioral Health Crisis Line at [phone], at any time, 24 hours a day, 7 days a week. If you are in danger or need immediate medical attention call 911.  If you would like help to quit smoking, call 1-800-QUIT-NOW ((314)619-4161) OR Espaol: 1-855-Djelo-Ya (3-474-259-5638) o para ms informacin haga clic aqu or Text READY to 756-433 to register via text     24- Hour Availability:    Jeffrey Fitzgerald  [address] [place], [place] [phone] Crisis [phone]   Family Service of the [facility] (737)497-4912  [place] [facility]  [phone]    [facility]  [phone] (after hours)   Therapeutic Alternative/Mobile Crisis   514-664-9996   [place] National Suicide Hotline  (607)014-1763 [place]) [place] 938   Call 646 167 4024 for mental health emergencies   [facility]  858-866-7073);   [place] and [employer]  (620)686-3749); [place], [place], [place], [place], [place], [place], [place]    [place] [facility] for [facility] Residents For 24/7 walk-up access to mental health services for [facility] children (4+), adolescents and adults, please visit the [facility] located at [address] in [place], [place].  *Jeffrey Fitzgerald also provides comprehensive outpatient behavioral health services in a variety of locations around the [place].  Connect With [place] [address] [place], [place] [address] HelpLine: (947) 473-1407 or 1-207-310-3047  Get Directions  Find Help 24/7 By Phone Call our 24-hour HelpLine at 805-798-4959 or (573) 802-2034 for immediate assistance for mental health and substance abuse issues.  Walk-In Help [place]: [facility] (Ages 4 and Up) [place]: Emergency Dept., [facility] Additional Resources National Hopeline Network: 1-800-SUICIDE The National Suicide Prevention Lifeline: 1-800-273-TALK       Jeffrey Fitzgerald, BSW, MSW, LCSW Managed Medicaid LCSW [facility]  [name] [email or web address] Phone: [phone]

## 2023-01-07 NOTE — Patient Outreach (Signed)
Medicaid Managed Care Social Work Note  01/06/2023 Name:  Jeffrey Fitzgerald MRN:  [id number] DOB:  22-May-1971  Jeffrey Fitzgerald is an 52 y.o. year old male who is a primary patient of [name], DO.  The [facility] Managed Care Coordination team was consulted for assistance with:  Level of Care Concerns Mental Health Counseling and Resources  Jeffrey Fitzgerald was given information about Medicaid Managed Care Coordination team services today. Jeffrey Fitzgerald Parent agreed to services and verbal consent obtained.  Engaged with patient  for by telephone forinitial visit in response to referral for case management and/or care coordination services.   Assessments/Interventions:  Review of past medical history, allergies, medications, health status, including review of consultants reports, laboratory and other test data, was performed as part of comprehensive evaluation and provision of chronic care management services.  SDOH: (Social Determinant of Health) assessments and interventions performed: SDOH Interventions    Flowsheet Row Patient Outreach Telephone from 01/06/2023 in [facility]PART[facility] Office Visit from 09/22/2022 in [facility]  SDOH Interventions    Food Insecurity Interventions -- Intervention Not Indicated  Housing Interventions Intervention Not Indicated Intervention Not Indicated  Transportation Interventions -- Intervention Not Indicated  Utilities Interventions -- Intervention Not Indicated  Financial Strain Interventions -- Intervention Not Indicated  Stress Interventions Offered [employer], Provide Counseling --       Advanced Directives Status:  See Care Plan for related entries.  Care Plan                 No Known Allergies  Medications Reviewed Today     Reviewed by Adron Bene, MD (Resident) on 09/24/22 at 1526  Med List Status: <None>   Medication Order Taking? Sig Documenting Provider Last  Dose Status Informant  acetaminophen (TYLENOL) 500 MG tablet [id number]  Take 1 tablet (500 mg total) by mouth every 6 (six) hours as needed for moderate pain. [name], DO  Active   amLODipine (NORVASC) 10 MG tablet [id number]  Take 1 tablet (10 mg total) by mouth daily. [name], DO  Active   aspirin EC 81 MG tablet [id number]  Take 1 tablet (81 mg total) by mouth daily. Swallow whole. [name], DO  Active   atorvastatin (LIPITOR) 80 MG tablet [id number]  Take 1 tablet (80 mg total) by mouth daily. [name], DO  Active   carvedilol (COREG) 12.5 MG tablet [id number]  TAKE 1 TABLET (12.5 MG TOTAL) BY MOUTH TWO (TWO) TIMES DAILY WITH A MEAL. [name], DO  Active   docusate sodium (COLACE) 100 MG capsule [id number]  Take 1 capsule (100 mg total) by mouth daily. [name], DO  Active   hydrALAZINE (APRESOLINE) 25 MG tablet [id number]  Take 1 tablet (25 mg total) by mouth 2 (two) times daily. [name], DO  Active   Nystatin (GERHARDT'S BUTT CREAM) CREA [id number]  Apply 1 Application topically as needed for irritation (apply to periarea as needed for excoriation). [name], DO  Active   polyethylene glycol [facility] / GLYCOLAX) packet 17 g [id number]   [name], DO  Active   sertraline (ZOLOFT) 25 MG tablet [id number]  Take 1 tablet (25 mg total) by mouth daily. 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   Depression 06/09/2022   Constipation 06/09/2022  S/P AKA (above knee amputation) bilateral (HCC) 06/09/2022   Malnutrition of moderate degree 05/29/2022   Muscle weakness of left upper extremity 05/27/2022   Tobacco dependence 05/27/2022   Adjustment reaction 05/27/2022   DNR (do not resuscitate) 05/27/2022   Acute postoperative anemia due to expected blood loss    Gastroesophageal reflux disease    Pressure  injury of skin 04/13/2022   Acute kidney injury [facility])    PAD (peripheral artery disease) (HCC) 04/07/2022   Critical limb ischemia of both lower extremities (HCC) 04/01/2022   Iliac artery occlusion, 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   Hypertension 01/25/2020    Conditions to be addressed/monitored per PCP order:  Depression  Care Plan : LCSW Plan of Care  Updates made by Jeffrey Bryant, LCSW since 01/07/2023 12:00 AM     Problem: Quality of Life (General Plan of Care)      Goal: Quality of Life Maintained   Start Date: 01/06/2023  Note:   Priority: High  Timeframe:  Long-Range Goal Priority:  High Start Date:   01/06/23           Expected End Date:  ongoing                     Follow Up Date--01/12/23 at 1 pm  Current barriers:   Level of care concerns and need for ALF placement Physical and mental health concerns  Need for FL2 Need for Financial Assistance and connection to available community resources Need for education on ALF placement process and education on facilities nearby in order to chose a facility they would like placement at  Clinical Goals: Patient's mother we work with agencies/resources/PCP/MMC LCSW to address needs related to gaining ALF placement and gaining desired appropriate and safe level of care  Clinical Interventions:  Inter-disciplinary care team collaboration (see longitudinal plan of care) Parent provided patient history Assessment of needs, progress, barriers , and agencies contacted  Assessed need for ALF placement  Advised patient's caregivers to answer all calls from care providers and to keep phone nearby. Advised parents to contact 911 or 988 if a crisis occurs.  Clinical interventions provided:Solution-Focused Strategies, Active listening / Reflection utilized , Problem Solving /Task Center ,  Two emails sent to patient's family with several documents regarding the ALF placement process, a complete list of ALF's in the area, and Medicaid transfer  educational materials.   Patient Goals/Self-Care Activities: Over the next 30 days Complete review of emailed resources and narrow your choice down to which Assisted Living Facility you wish patient to go to Call Social Services to follow up Medicaid transfer  Make a PCP appointment       Follow up:  Patient agrees to Care Plan and Follow-up.  Plan: The Managed Medicaid care management team will reach out to the patient again over the next 30 days.  [name], BSW, MSW, [name] Managed Medicaid LCSW [facility]  [name] [email or web address] Phone: [phone]

## 2023-01-10 NOTE — Patient Outreach (Signed)
Error

## 2023-01-11 ENCOUNTER — Ambulatory Visit (INDEPENDENT_AMBULATORY_CARE_PROVIDER_SITE_OTHER): Payer: Medicaid Other | Admitting: Student

## 2023-01-11 VITALS — BP 109/52 | HR 54 | Temp 98.4°F | Ht 65.0 in

## 2023-01-11 DIAGNOSIS — R5381 Other malaise: Secondary | ICD-10-CM | POA: Diagnosis present

## 2023-01-11 DIAGNOSIS — Z23 Encounter for immunization: Secondary | ICD-10-CM

## 2023-01-11 DIAGNOSIS — I10 Essential (primary) hypertension: Secondary | ICD-10-CM

## 2023-01-11 DIAGNOSIS — K432 Incisional hernia without obstruction or gangrene: Secondary | ICD-10-CM

## 2023-01-11 DIAGNOSIS — Z1211 Encounter for screening for malignant neoplasm of colon: Secondary | ICD-10-CM

## 2023-01-11 NOTE — Patient Instructions (Addendum)
  Thank you, [name], for allowing [place] to provide your care today. Today we discussed . . .  > Blood pressure       -Your blood pressure today is low.  We will stop your hydralazine and you will continue on amlodipine 10 mg daily for your blood pressure. > Physical deconditioning       -We are going to work on referral to get you rehab at a nursing facility.  I think this is very important to help your quality of life.  We will let you know if there is anything we need for you to do while we work on this. > Abdominal hernia       -Please call our office or go to the emergency room if you experience pain that does not resolve, if your hernia is not able to be pushed back in, if you have any bloody stools, or any other worrisome symptoms.   I have ordered the following labs for you:  Lab Orders  No laboratory test(s) ordered today    Referrals ordered today:   Referral Orders         Ambulatory referral to Gastroenterology         Ambulatory referral to Physical Therapy       I have ordered the following medication/changed the following medications:   Stop the following medications: Medications Discontinued During This Encounter  Medication Reason   hydrALAZINE (APRESOLINE) 25 MG tablet      Start the following medications: No orders of the defined types were placed in this encounter.     Follow up: 3 months    Remember:     Should you have any questions or concerns please call the internal medicine clinic at [phone].     [name], DO [facility]

## 2023-01-11 NOTE — Progress Notes (Addendum)
CC: Physical deconditioning  HPI:  Jeffrey Fitzgerald is a 52 y.o. male with PMH of HTN, PAD s/p bilateral AKA and aortobifemoral bypass, and constipation with prior fecal incontinence who presents to the clinic for follow-up visit with acute concerns of progressive physical deconditioning.  He is spending most of his time laying in his bed and is not able to transfer himself or support his body weight with his arms despite having a normal BMI.  This is further discussed in the assessment and plan.  Past Medical History:  Diagnosis Date   Hypertension    Peripheral vascular disease (HCC)    Tobacco abuse    Review of Systems:   Pertinent items noted in HPI and/or A&P.  Physical Exam:  Vitals:   01/11/23 1449 01/11/23 1519  BP: (!) 100/47 (!) 109/52  Pulse: (!) 56 (!) 54  Temp: 98.4 F (36.9 C)   TempSrc: Oral   SpO2: 100%   Height: 5\' 5"  (1.651 m)     Constitutional: Disheveled and tired appearing male who appears older than stated age in a wheelchair. In no acute distress. HEENT: Normocephalic, atraumatic, Sclera non-icteric, PERRL, EOM intact Cardio:Regular rate and rhythm. 2+ bilateral radial pulses. Pulm:Clear to auscultation bilaterally. Normal work of breathing on room air. Abdomen: Soft, non-tender, non-distended, positive bowel sounds.  Small reducible hernia midline supraumbilical in line with surgical abdominal scar. MSK: Stable bilateral AKA. Skin:Warm and dry. Neuro:Alert and oriented x3. No focal deficit noted. Psych:Pleasant mood and affect.   Assessment & Plan:   Physical deconditioning Patient has had progressive physical deconditioning due to inactivity related to bilateral AKA.  He spends most of his time in his bed at home and only moves when there people able to transfer him to his wheelchair.  He has a home health nurse that comes out every day for about 2 hours and will change him.  They denied any pressure wounds developing.  He is unable to support  his own weight with his arms although his BMI is low and is unable to transfer to his wheelchair without full assistance.  We discussed home health PT versus rehab at a nursing facility and they strongly prefer the nursing facility.  Due to his severe physical deconditioning he would benefit from daily rehab in a nursing facility to improve his strength and work on transferring.  At the end of the visit his home health nurse did arrive and described a developing stage 1/2 pressure wound on his sacral area and irritation in his perineum.  We were unable to evaluate this area at that time but I do recommend barrier creams and will ensure the nursing facility is aware of these wherever he goes.  Plan - Work on approval for rehab at a nursing facility - Barrier creams for pressure injury of backside  Abdominal hernia Stable small, nontender, reducible incisional hernia in the midline suprapubic area.  Discussed signs and symptoms to watch out for that would require urgent medical care.  Will continue to monitor for now as there is no indication for surgery at this time.  Hypertension BP today is low at 109/52.  He is on amlodipine 10 mg daily, carvedilol 12.5 mg twice daily, and hydralazine 25 mg twice daily.  We will discontinue his hydralazine as it is low-dose and only twice a day. - Stop hydralazine and continue amlodipine 10 mg daily and carvedilol 12.5 mg twice daily    Patient discussed with Dr. Quinn Plowman, DO Internal  Medicine Center Internal Medicine Resident PGY-1 Pager: [phone]

## 2023-01-12 ENCOUNTER — Other Ambulatory Visit: Payer: Medicaid Other | Admitting: Licensed Clinical Social Worker

## 2023-01-12 ENCOUNTER — Telehealth: Payer: Self-pay

## 2023-01-12 DIAGNOSIS — R5381 Other malaise: Secondary | ICD-10-CM | POA: Insufficient documentation

## 2023-01-12 NOTE — Patient Instructions (Signed)
Visit Information  Jeffrey Fitzgerald was given information about Medicaid Managed Care team care coordination services as a part of their [facility] Community Plan Medicaid benefit. Jeffrey Fitzgerald's motherverbally consented to engagement with the [facility] Managed Care team.   If you are experiencing a medical emergency, please call 911 or report to your local emergency department or urgent care.   If you have a non-emergency medical problem during routine business hours, please contact your provider's office and ask to speak with a nurse.   For questions related to your [facility], please call: [phone] or visit the homepage here: [email or web address]  If you would like to schedule transportation through your [facility], please call the following number at least 2 days in advance of your appointment: [phone]   Rides for urgent appointments can also be made after hours by calling Member Services.  Call the Behavioral Health Crisis Line at [phone], at any time, 24 hours a day, 7 days a week. If you are in danger or need immediate medical attention call 911.  If you would like help to quit smoking, call 1-800-QUIT-NOW (5748011849) OR Espaol: 1-855-Djelo-Ya (1-324-401-0272) o para ms informacin haga clic aqu or Text READY to 536-644 to register via text

## 2023-01-12 NOTE — Patient Outreach (Addendum)
Medicaid Managed Care Social Work Note  01/12/2023 Name:  Jeffrey Fitzgerald MRN:  [id number] DOB:  [birth year]  Jeffrey Fitzgerald is an 52 y.o. year old male who is a primary patient of Jeffrey Morel, DO.  The [facility] Managed Care Coordination team was consulted for assistance with:  Level of Care Concerns  Jeffrey Fitzgerald was given information about Medicaid Managed Care Coordination team services today. Jeffrey Fitzgerald Parent agreed to services and verbal consent obtained.  Engaged with patient  for by telephone forfollow up visit in response to referral for case management and/or care coordination services.   Assessments/Interventions:  Review of past medical history, allergies, medications, health status, including review of consultants reports, laboratory and other test data, was performed as part of comprehensive evaluation and provision of chronic care management services.  SDOH: (Social Determinant of Health) assessments and interventions performed: SDOH Interventions    Flowsheet Row Patient Outreach Telephone from 01/06/2023 in [place] [facility]PART[facility] Office Visit from 09/22/2022 in [facility] - Psychiatric Support [facility] Internal Medicine Fitzgerald  SDOH Interventions    Food Insecurity Interventions -- Intervention Not Indicated  Housing Interventions Intervention Not Indicated Intervention Not Indicated  Transportation Interventions -- Intervention Not Indicated  Utilities Interventions -- Intervention Not Indicated  Financial Strain Interventions -- Intervention Not Indicated  Stress Interventions Offered [employer], Provide Counseling --       Advanced Directives Status:  See Care Plan for related entries.  Care Plan                 No Known Allergies  Medications Reviewed Today     Reviewed by Jeffrey Bene, MD (Resident) on 09/24/22 at 1526  Med List Status: <None>   Medication Order Taking? Sig Documenting Provider Last Dose Status Informant  acetaminophen  (TYLENOL) 500 MG tablet [id number]  Take 1 tablet (500 mg total) by mouth every 6 (six) hours as needed for moderate pain. Jeffrey Morel, DO  Active   amLODipine (NORVASC) 10 MG tablet [id number]  Take 1 tablet (10 mg total) by mouth daily. Jeffrey Morel, DO  Active   aspirin EC 81 MG tablet [id number]  Take 1 tablet (81 mg total) by mouth daily. Swallow whole. Jeffrey Morel, DO  Active   atorvastatin (LIPITOR) 80 MG tablet [id number]  Take 1 tablet (80 mg total) by mouth daily. Jeffrey Morel, DO  Active   carvedilol (COREG) 12.5 MG tablet [id number]  TAKE 1 TABLET (12.5 MG TOTAL) BY MOUTH TWO (TWO) TIMES DAILY WITH A MEAL. Jeffrey Morel, DO  Active   docusate sodium (COLACE) 100 MG capsule [id number]  Take 1 capsule (100 mg total) by mouth daily. Jeffrey Morel, DO  Active   hydrALAZINE (APRESOLINE) 25 MG tablet [id number]  Take 1 tablet (25 mg total) by mouth 2 (two) times daily. Jeffrey Morel, DO  Active   Nystatin (GERHARDT'S BUTT CREAM) CREA [id number]  Apply 1 Application topically as needed for irritation (apply to periarea as needed for excoriation). Jeffrey Morel, DO  Active   polyethylene glycol [facility] / GLYCOLAX) packet 17 g [id number]   Jeffrey Morel, DO  Active   sertraline (ZOLOFT) 25 MG tablet [id number]  Take 1 tablet (25 mg total) by mouth daily. Jeffrey Morel, DO  Active             Patient Active Problem List   Diagnosis Date Noted   Physical deconditioning 01/12/2023   Abdominal hernia 09/24/2022   S/P aortobifemoral bypass surgery 06/29/2022   Depression 06/09/2022   Constipation 06/09/2022  S/P AKA (above knee amputation) bilateral (HCC) 06/09/2022   Malnutrition of moderate degree 05/29/2022   Muscle weakness of left upper extremity 05/27/2022   Tobacco dependence 05/27/2022   Adjustment reaction 05/27/2022   DNR (do not resuscitate) 05/27/2022   Acute postoperative anemia due to expected blood loss    Gastroesophageal reflux disease    Pressure  injury of skin 04/13/2022   PAD (peripheral artery disease) (HCC) 04/07/2022   Critical limb ischemia of both lower extremities (HCC) 04/01/2022   Iliac artery occlusion, 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   Hypertension 01/25/2020    Care Plan : LCSW Plan of Care  Updates made by Jeffrey Bryant, LCSW since 01/12/2023 12:00 AM     Problem: Quality of Life (General Plan of Care)      Goal: Quality of Life Maintained   Start Date: 01/06/2023  Note:   Priority: High  Timeframe:  Long-Range Goal Priority:  High Start Date:   01/06/23           Expected End Date:  ongoing                     Follow Up Date--01/20/23 at 1030 am  Current barriers:   Level of care concerns and need for SNF placement Physical and mental health concerns  Need for FL2 Need for Financial Assistance and connection to available community resources Need for education on SNF placement process and education on facilities nearby in order to chose a facility they would like placement  Loss of recent caregiver support due to niece becoming ill (mother is now primary caregiver other than part time CNA that comes in)  Clinical Goals: Patient's mother we work with agencies/resources/PCP/Internal Medicine LCSW, [facility] LCSW to address needs related to gaining SNF placement and gaining desired physical therapy treatment at a safe level of care  Clinical Interventions:  Inter-disciplinary care team collaboration (see longitudinal plan of care) Parent provided patient history Assessment of needs, progress, barriers , and agencies contacted  Assessed need for SNF placement  Advised patient's caregivers to answer all calls from care providers and to keep phone nearby. Advised parents to contact 911 or [phone] if a crisis occurs.  Clinical interventions provided:Solution-Focused Strategies, Active listening / Reflection utilized , Problem Solving Jeffrey Fitzgerald ,  Past update-Two emails sent to patient's family with several documents regarding the  ALF placement process, a complete list of ALF's in the area, and Medicaid transfer educational materials. 01/12/23- [facility] LCSW completed call to mother today and was informed that patient completed PCP visit yesterday and a HH referral was made but that she received a call today regarding this referral stating that since patient has no prosthesis, he is not eligible for physical therapy. I first spoke with patient's family on 01/06/23 and family seemed on board with long term ALF placement but after disclosing today that the state would take over patient's income once Medicaid transitions from community Medicaid to nursing facility Medicaid, family report that they just wish for patient to be able to get stronger and be able to live in the home independently. Patient's previous primary caregiver [name], his niece is unable to provide as much care now so family is limited in support which was reminded to family. Family does have PCS involvement and a regular aide who they really like. [facility] LCSW will route encounter to PCP and PCP's LCSW and follow up next week.  Patient Goals/Self-Care Activities: Over the next 30 days Complete review of emailed  resources  Call Social Services to discuss resource needs with Medicaid caseworker Make a PCP appointment       Follow up:  Patient agrees to Care Plan and Follow-up.  Plan: The Managed Medicaid care management team will reach out to the patient again over the next 30 days.  [name], BSW, MSW, [name] Managed Medicaid LCSW [facility]  [name] [email or web address] Phone: [phone]

## 2023-01-12 NOTE — Assessment & Plan Note (Signed)
Patient has had progressive physical deconditioning due to inactivity related to bilateral AKA.  He spends most of his time in his bed at home and only moves when there people able to transfer him to his wheelchair.  He has a home health nurse that comes out every day for about 2 hours and will change him.  They denied any pressure wounds developing.  He is unable to support his own weight with his arms although his BMI is low and is unable to transfer to his wheelchair without full assistance.  We discussed home health PT versus rehab at a nursing facility and they strongly prefer the nursing facility.  Due to his severe physical deconditioning he would benefit from daily rehab in a nursing facility to improve his strength and work on transferring.  At the end of the visit his home health nurse did arrive and described a developing stage 1/2 pressure wound on his sacral area and irritation in his perineum.  We were unable to evaluate this area at that time but I do recommend barrier creams and will ensure the nursing facility is aware of these wherever he goes.  Plan - Work on approval for rehab at a nursing facility - Barrier creams for pressure injury of backside

## 2023-01-12 NOTE — Telephone Encounter (Signed)
Patients mother Marylene Land called she stated [facility] told her that they will not be able to see the patient until he has a prosthetic leg. Please return Angela's call @[phone]

## 2023-01-12 NOTE — Assessment & Plan Note (Signed)
Stable small, nontender, reducible incisional hernia in the midline suprapubic area.  Discussed signs and symptoms to watch out for that would require urgent medical care.  Will continue to monitor for now as there is no indication for surgery at this time.

## 2023-01-14 NOTE — Progress Notes (Signed)
Internal Medicine Clinic Attending  Case discussed with Dr. Goodwin  At the time of the visit.  We reviewed the resident's history and exam and pertinent patient test results.  I agree with the assessment, diagnosis, and plan of care documented in the resident's note.  

## 2023-01-17 ENCOUNTER — Telehealth: Payer: Self-pay | Admitting: *Deleted

## 2023-01-19 ENCOUNTER — Other Ambulatory Visit: Payer: Self-pay

## 2023-01-19 ENCOUNTER — Telehealth: Payer: Self-pay | Admitting: *Deleted

## 2023-01-19 ENCOUNTER — Ambulatory Visit (INDEPENDENT_AMBULATORY_CARE_PROVIDER_SITE_OTHER): Payer: Medicaid Other | Admitting: Student

## 2023-01-19 ENCOUNTER — Encounter: Payer: Self-pay | Admitting: Student

## 2023-01-19 VITALS — BP 113/55 | HR 64 | Temp 98.2°F

## 2023-01-19 DIAGNOSIS — N5089 Other specified disorders of the male genital organs: Secondary | ICD-10-CM

## 2023-01-19 DIAGNOSIS — I10 Essential (primary) hypertension: Secondary | ICD-10-CM | POA: Diagnosis not present

## 2023-01-19 DIAGNOSIS — L89151 Pressure ulcer of sacral region, stage 1: Secondary | ICD-10-CM | POA: Diagnosis not present

## 2023-01-19 DIAGNOSIS — S31000D Unspecified open wound of lower back and pelvis without penetration into retroperitoneum, subsequent encounter: Secondary | ICD-10-CM | POA: Diagnosis present

## 2023-01-19 NOTE — Telephone Encounter (Signed)
Call from patient's mom and Beatriz Chancellor from [facility]. Patient's backside , Scrotum area are breaking down.  Rash like appearance red. No drainage at present.  Have been applying Desitin ointment.  Sites are worsening and enlarging since last visit.  Given an appointment to come into day for assessment of the area at 10:45 AM today.

## 2023-01-19 NOTE — Patient Instructions (Addendum)
Take amlodipine 5mg  or half of your current tablet.   We will send a referral to a wound care center for your wounds. Please use aquaphor to help with preventing skin breakdown.   Please start ensure shakes.

## 2023-01-19 NOTE — Assessment & Plan Note (Signed)
BP today is low at 109/52.  He is on amlodipine 10 mg daily, carvedilol 12.5 mg twice daily, and hydralazine 25 mg twice daily.  We will discontinue his hydralazine as it is low-dose and only twice a day. - Stop hydralazine and continue amlodipine 10 mg daily and carvedilol 12.5 mg twice daily

## 2023-01-20 ENCOUNTER — Other Ambulatory Visit: Payer: Medicaid Other | Admitting: Licensed Clinical Social Worker

## 2023-01-20 NOTE — Patient Outreach (Addendum)
Care Coordination  01/20/2023  Jeffrey Fitzgerald 1970/11/29 [id number]  Ongoing care coordination calls/messages between with medical providers, [facility] team and LCSW at Internal Medicine regarding need for SNF placement. New FL2 has been completed and should show up in chart by tomorrow per physician and social work collaboration call. [name] completed call to patient's mother and provided updates. Per mother, family wishes for list of SNF's to be sent to [employer] email ([email or web address]).  Reminders for the family-( this was sent in the email )  Call facilities and ask to speak to admissions department, then ask if they can accept him for 21 day short term placement under [facility] Managed Medicaid insurance   Call number back on insurance card and ask about short term nursing facility placement. Mother reports that card is with niece and not her right now. [employer] benefits number provided as well.  Call DSS for placement assistance. Per PCP LCSW, "Family needs to contact a placement they prefer and have the Fl2 form to give to agency. Family also have a placement worker with DSS who assist with locating placement"  [facility] appointment reminder sent in email as well. Address and number included.  [name], BSW, MSW, [name] Phone: [phone]

## 2023-01-20 NOTE — Patient Instructions (Signed)
Visit Information   Email sent to family includes-  FULL LIST OF SNF'S (Short Term Nursing Facilities) in your area attached at the top of this email  Reminder for family- Call facilities and ask to speak to admissions department, then ask if they can accept him for 21 day short term placement under [facility] Managed Medicaid insurance  Call number back on insurance card and ask about short term nursing facility placement Call DSS because they can help with this as well.  Family needs to contact a placement they prefer and have the Fl2 form to give to agency. Family also have a placement worker with DSS who assist with locating placement  [email or web address]   Appointment Reminder Provided  02/10/2023 8:00 AM  with Duanne Guess, MD       [facility] at [facility] [address] [place], [place] [address] [place], [place] [address]   Mr. Manley was given information about Medicaid Managed Care team care coordination services as a part of their [facility] Community Plan Medicaid benefit. Kaeson Gural's mother verbally consented to engagement with the [facility] Managed Care team.   If you are experiencing a medical emergency, please call 911 or report to your local emergency department or urgent care.   If you have a non-emergency medical problem during routine business hours, please contact your provider's office and ask to speak with a nurse.   For questions related to your [facility], please call: [phone] or visit the homepage here: [email or web address]  If you would like to schedule transportation through your [facility], please call the following number at least 2 days in advance of your appointment: [phone]   Rides for urgent appointments can also be made after hours by  calling Member Services.  Call the Behavioral Health Crisis Line at [phone], at any time, 24 hours a day, 7 days a week. If you are in danger or need immediate medical attention call 911.  If you would like help to quit smoking, call 1-800-QUIT-NOW ((856)675-0396) OR Espaol: 1-855-Djelo-Ya (7-253-664-4034) o para ms informacin haga clic aqu or Text READY to 742-595 to register via text  Following is a copy of your plan of care:  Care Plan : LCSW Plan of Care  Updates made by [name], LCSW since 01/20/2023 12:00 AM     Problem: Quality of Life (General Plan of Care)      Goal: Quality of Life Maintained   Start Date: 01/06/2023  Note:   Priority: High  Timeframe:  Long-Range Goal Priority:  High Start Date:   01/06/23           Expected End Date:  ongoing                     Follow Up Date--01/27/23 at 9 am  Current barriers:   Level of care concerns and need for SNF placement Physical and mental health concerns  Need for FL2 Need for Financial Assistance and connection to available community resources Need for education on SNF placement process and education on facilities nearby in order to chose a facility they would like placement  Loss of recent caregiver support due to niece becoming ill (mother is now primary caregiver other than part time CNA that comes in) Update- Mother reports that family   Clinical Goals: Patient's mother we work with agencies/resources/PCP/Internal Medicine LCSW, [facility] LCSW to address needs related to gaining SNF placement and gaining  desired physical therapy treatment at a safe level of care  Patient Goals/Self-Care Activities: Over the next 30 days Complete review of emailed resources  Call Social Services to discuss resources and support from [facility] caseworker Make a PCP appointment for [facility] completion      24- Hour Availability:    [facility]  [address] [place], [place]  [phone] Crisis [phone]   [facility] [phone]  [place] [facility]  [phone]    [facility]  [phone] (after hours)   Therapeutic Alternative/Mobile Crisis   [phone]   [place] National Suicide Hotline  (984)876-9524 Len Childs) [place] [address]   Call 352-615-9648 for mental health emergencies   [facility]  [phone]);  [place] and [employer]  [phone]); [place], [place], [place], [place], [place], [place], [place]    [place] [facility] for [facility] Residents For 24/7 walk-up access to mental health services for [facility] children (4+), adolescents and adults, please visit the [facility] located at [address] in [place], [place].  *[place] also provides comprehensive outpatient behavioral health services in a variety of locations around the [place].  Connect With [place] [address] [place], [place] [address] HelpLine: 205-560-2088 or 1-515-058-3928  Get Directions  Find Help 24/7 By Phone Call our 24-hour HelpLine at 409-506-9168 or 719-633-3678 for immediate assistance for mental health and substance abuse issues.  Walk-In Help [place]: [facility] (Ages 4 and Up) [place]: Emergency Dept., [facility] Additional Resources National Hopeline Network: 1-800-SUICIDE The National Suicide Prevention Lifeline: 819-482-6526

## 2023-01-22 DIAGNOSIS — N5089 Other specified disorders of the male genital organs: Secondary | ICD-10-CM | POA: Insufficient documentation

## 2023-01-22 DIAGNOSIS — L89151 Pressure ulcer of sacral region, stage 1: Secondary | ICD-10-CM | POA: Insufficient documentation

## 2023-01-22 NOTE — Assessment & Plan Note (Signed)
Patient's aide states that the patient has access to her for a limited time each day. In addition, patient's help from family is limited as his closest family members are unable to physically help the patient due to their medical problems. Because of this patient is sometimes remains in his urine or feces until he sees his aide the following day. He has developed superficial wound of his scrotum and sacrum.   His aide has been applying desitin.   Referred patient to wound care and discussed continued use of barrier cream.    Patient referred to physical therapy to help with upper body strength and hopefully begin transferring to and from the toilet.   Our clinic staff is working to get this set up for patient. Most recently our team was told that PT would not see patient until he has prostheses for his legs.   Patient's PCP and his family are also working to see if patient qualifies for a nursing facility.

## 2023-01-22 NOTE — Assessment & Plan Note (Signed)
Continue barrier cream/ointment. Family and PCP working to get patient more assistance and to get him PT.

## 2023-01-22 NOTE — Assessment & Plan Note (Signed)
Decreased patient's amlodipine to 5mg  daily from 10mg .   Today's Vitals   01/19/23 1114  BP: (!) 113/55  Pulse: 64  Temp: 98.2 F (36.8 C)  TempSrc: Oral  SpO2: 100%  PainSc: 0-No pain   There is no height or weight on file to calculate BMI.

## 2023-01-22 NOTE — Progress Notes (Signed)
   CC: F/u of chronic wounds.   HPI:  [name] is a 52 y.o. M with PMH per below who presents for follow up of sacral and scrotal wounds. He is accompanied by his caregiver. Please see problem based charting under encounters tab for further details.    Past Medical History:  Diagnosis Date   Hypertension    Peripheral vascular disease (HCC)    Tobacco abuse    Review of Systems:  Please see problem based charting under encounters tab for further details.    Physical Exam:  Vitals:   01/19/23 1114  BP: (!) 113/55  Pulse: 64  Temp: 98.2 F (36.8 C)  TempSrc: Oral  SpO2: 100%   Physical Exam  Constitutional: Thin and in no distress.  HENT:  Head: Normocephalic and atraumatic.  Eyes: EOM are normal.  Neck: Normal range of motion.  Cardiovascular: Normal rate, regular rhythm, intact distal pulses. No gallop and no friction rub.  No murmur heard. No lower extremity edema  Pulmonary: Non labored breathing on room air, no wheezing or rales  Abdominal: Soft. Normal bowel sounds. Non distended and non tender Musculoskeletal: s/p bilateral AKA  Neurological: Alert and oriented to person, place, and time. Non focal  Skin:       Media Information     Assessment & Plan:   See Encounters Tab for problem based charting.  Patient discussed with Dr. Heide Spark

## 2023-01-25 ENCOUNTER — Telehealth: Payer: Self-pay | Admitting: Licensed Clinical Social Worker

## 2023-01-25 NOTE — Progress Notes (Signed)
Internal Medicine Clinic Attending  Case discussed with Dr. Carter  At the time of the visit.  We reviewed the resident's history and exam and pertinent patient test results.  I agree with the assessment, diagnosis, and plan of care documented in the resident's note.  

## 2023-01-25 NOTE — Patient Instructions (Signed)
Visit Information  Jeffrey Fitzgerald   If you are experiencing a medical emergency, please call 911 or report to your local emergency department or urgent care.   If you have a non-emergency medical problem during routine business hours, please contact your provider's office and ask to speak with a nurse.   For questions related to your [facility], please call: [phone] or visit the homepage here: [email or web address]  If you would like to schedule transportation through your [facility], please call the following number at least 2 days in advance of your appointment: [phone]   Rides for urgent appointments can also be made after hours by calling Member Services.  Call the Behavioral Health Crisis Line at [phone], at any time, 24 hours a day, 7 days a week. If you are in danger or need immediate medical attention call 911.  If you would like help to quit smoking, call 1-800-QUIT-NOW ((579)428-7223) OR Espaol: 1-855-Djelo-Ya (3-474-259-5638) o para ms informacin haga clic aqu or Text READY to 756-433 to register via text

## 2023-01-25 NOTE — Patient Outreach (Signed)
Care Coordination  01/21/2023  Christohper Borstad May 24, 1971 [id number]  [facility] LCSW received voice message from family stating that they were unable to find email that was sent and were requesting that this resource email be sent out again. [facility] LCSW resent email with resources that family have requested for Short Term SNF placement.  [name], BSW, MSW, [name] Managed Medicaid LCSW [facility]  [name] [email or web address] Phone: [phone]

## 2023-01-25 NOTE — Addendum Note (Signed)
Addended by: Earl Lagos on: 01/25/2023 10:12 AM   Modules accepted: Level of Service

## 2023-01-27 ENCOUNTER — Other Ambulatory Visit: Payer: Medicaid Other | Admitting: Licensed Clinical Social Worker

## 2023-01-27 NOTE — Patient Outreach (Addendum)
Medicaid Managed Care Social Work Note  01/27/2023 Name:  Jeffrey Fitzgerald MRN:  [id number] DOB:  Oct 23, 1970  Jeffrey Fitzgerald is an 52 y.o. year old male who is a primary patient of [name], DO.  The Medicaid Managed Care Coordination team was consulted for assistance with:  Mental Health Counseling and Resources  Jeffrey Fitzgerald was given information about Medicaid Managed Care Coordination team services today. Jeffrey Fitzgerald Patient agreed to services and verbal consent obtained.  Engaged with patient  for by telephone forfollow up visit in response to referral for case management and/or care coordination services.   Assessments/Interventions:  Review of past medical history, allergies, medications, health status, including review of consultants reports, laboratory and other test data, was performed as part of comprehensive evaluation and provision of chronic care management services.  SDOH: (Social Determinant of Health) assessments and interventions performed: SDOH Interventions    Flowsheet Row Patient Outreach Telephone from 01/27/2023 in [place] [facility] Patient Outreach Telephone from 01/06/2023 in [place] POPULATION HEALTH DEPARTMENT Office Visit from 09/22/2022 in [facility]  SDOH Interventions     Food Insecurity Interventions -- -- Intervention Not Indicated  Housing Interventions -- Intervention Not Indicated Intervention Not Indicated  Transportation Interventions -- -- Intervention Not Indicated  Utilities Interventions -- -- Intervention Not Indicated  Financial Strain Interventions -- -- Intervention Not Indicated  Stress Interventions Offered [employer] Resources, Provide Counseling  [per mother] Offered [employer], Provide Counseling --       Advanced Directives Status:  See Care Plan for related entries.  Care Plan                 No Known Allergies  Medications Reviewed Today      Reviewed by Adron Bene, MD (Resident) on 09/24/22 at 1526  Med List Status: <None>   Medication Order Taking? Sig Documenting Provider Last Dose Status Informant  acetaminophen (TYLENOL) 500 MG tablet [id number]  Take 1 tablet (500 mg total) by mouth every 6 (six) hours as needed for moderate pain. [name], DO  Active   amLODipine (NORVASC) 10 MG tablet [id number]  Take 1 tablet (10 mg total) by mouth daily. [name], DO  Active   aspirin EC 81 MG tablet [id number]  Take 1 tablet (81 mg total) by mouth daily. Swallow whole. [name], DO  Active   atorvastatin (LIPITOR) 80 MG tablet [id number]  Take 1 tablet (80 mg total) by mouth daily. [name], DO  Active   carvedilol (COREG) 12.5 MG tablet [id number]  TAKE 1 TABLET (12.5 MG TOTAL) BY MOUTH TWO (TWO) TIMES DAILY WITH A MEAL. [name], DO  Active   docusate sodium (COLACE) 100 MG capsule [id number]  Take 1 capsule (100 mg total) by mouth daily. [name], DO  Active   hydrALAZINE (APRESOLINE) 25 MG tablet [id number]  Take 1 tablet (25 mg total) by mouth 2 (two) times daily. [name], DO  Active   Nystatin (GERHARDT'S BUTT CREAM) CREA [id number]  Apply 1 Application topically as needed for irritation (apply to periarea as needed for excoriation). [name], DO  Active   polyethylene glycol [facility] / GLYCOLAX) packet 17 g [id number]   [name], DO  Active   sertraline (ZOLOFT) 25 MG tablet [id number]  Take 1 tablet (25 mg total) by mouth daily. [name], DO  Active             Patient Active Problem List   Diagnosis Date  Noted   Pressure injury of sacral region, stage 1 01/22/2023   Scrotal erythema 01/22/2023   Physical deconditioning 01/12/2023   Abdominal hernia 09/24/2022   S/P aortobifemoral bypass surgery 06/29/2022   Depression 06/09/2022   Constipation 06/09/2022   S/P AKA (above knee amputation) bilateral (HCC) 06/09/2022   Malnutrition of moderate degree  05/29/2022   Muscle weakness of left upper extremity 05/27/2022   Tobacco dependence 05/27/2022   Adjustment reaction 05/27/2022   DNR (do not resuscitate) 05/27/2022   Acute postoperative anemia due to expected blood loss    Gastroesophageal reflux disease    Pressure injury of skin 04/13/2022   PAD (peripheral artery disease) (HCC) 04/07/2022   Critical limb ischemia of both lower extremities (HCC) 04/01/2022   Iliac artery occlusion, 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   Hypertension 01/25/2020    Care Plan : LCSW Plan of Care  Updates made by [name], LCSW since 01/27/2023 12:00 AM     Problem: Quality of Life (General Plan of Care)      Goal: Quality of Life Maintained   Start Date: 01/06/2023  Note:   Priority: High  Timeframe:  Long-Range Goal Priority:  High Start Date:   01/06/23           Expected End Date:  ongoing                     Follow Up Date--02/04/23 at 1045 am  Current barriers:   Level of care concerns and need for SNF placement Physical and mental health concerns  Need for FL2 Need for Financial Assistance and connection to available community resources Need for education on SNF placement process and education on facilities nearby in order to chose a facility they would like placement  Loss of recent caregiver support due to niece becoming ill (mother is now primary caregiver other than part time CNA that comes in) Update- Mother reports that family   Clinical Goals: Patient's mother we work with agencies/resources/PCP/Internal Medicine LCSW, [facility] LCSW to address needs related to gaining SNF placement and gaining desired physical therapy treatment at a safe level of care  Clinical Interventions:  Inter-disciplinary care team collaboration (see longitudinal plan of care) Parent provided patient history Assessment of needs, progress, barriers , and agencies contacted  Assessed need for SNF placement  Advised patient's caregivers to answer all calls from care  providers and to keep phone nearby. Advised parents to contact 911 or [phone] if a crisis occurs.  Clinical interventions provided:Solution-Focused Strategies, Active listening / Reflection utilized , Problem Solving [name] ,  Past update-Two emails sent to patient's family with several documents regarding the ALF placement process, a complete list of ALF's in the area, and Medicaid transfer educational materials. 01/12/23- [facility] LCSW completed call to mother today and was informed that patient completed PCP visit yesterday and a HH referral was made but that she received a call today regarding this referral stating that since patient has no prosthesis, he is not eligible for physical therapy. I first spoke with patient's family on 01/06/23 and family seemed on board with long term ALF placement but after disclosing today that the state would take over patient's income once Medicaid transitions from community Medicaid to nursing facility Medicaid, family report that they just wish for patient to be able to get stronger and be able to live in the home independently. Patient's previous primary caregiver [name], his niece is unable to provide as much case now so family is  limited in support which was reminded to family. Family does have PCS involvement and a regular aide who they really like. [facility] LCSW will route encounter to PCP and PCP's LCSW and follow up next week. 01/20/23 update- Patient's mother reports that [facility] has started and wound care has been ordered by PCP. Mother is requesting assistance with SNF placement. [facility] LCSW provided education on requirements for short term placement. [facility] LCSW advised patient will need to be recommended for SNF placement but PT (this SNF recommendation has recently been documented in patient's chart) and a FL2 document signed by a physician. [facility] LCSW had mother physically write down some reminders: all apfuture appointments and to utilize [facility] benefits program and call the number on the  back of patient's insurance card. [facility] LCSW completed care coordination with PCP through secure chat. Update 01/27/23- Patient's mother reports that she still has not received email with resources that [facility] LCSW sent 3 different times. Patient's mother contacted [name] and received correct email and [facility] LCSW resent resources out to this correct email [email or web address] on 01/27/23. [facility] LCSW will follow up in in 2 weeks.    Patient Goals/Self-Care Activities: Over the next 30 days Complete review of emailed resources  Call Social Services to discuss resources and support from [facility] caseworker Make a PCP appointment for [facility] completion      24- Hour Availability:    [facility]  [address] [place], [place] [phone] Crisis [phone]   [facility] [phone]  [place] [facility]  [phone]    [facility]  [phone] (after hours)   Therapeutic Alternative/Mobile Crisis   [phone]   [place] National Suicide Hotline  765-764-0943 Len Childs) [place] [address]   Call [phone] for mental health emergencies   [facility]  [phone]);  [place] and [employer]  [phone]); [place], [place], [place], [place], [place], [place], [place]    [place] [facility] for [facility] Residents For 24/7 walk-up access to mental health services for [facility] children (4+), adolescents and adults, please visit the [facility] located at [address] in [place], [place].  *[place] also provides comprehensive outpatient behavioral health services in a variety of locations around the [place].  Connect With [place] [address] [place], [place] [address] HelpLine: 616-276-4944 or [phone]  Get Directions  Find Help 24/7 By Phone Call our 24-hour HelpLine at 929-444-8640 or 818-089-4882 for immediate assistance  for mental health and substance abuse issues.  Walk-In Help [place]: [facility] (Ages 4 and Up) [place]: Emergency Dept., [facility] Additional Resources National Hopeline Network: 1-800-SUICIDE The National Suicide Prevention Lifeline: 1-800-273-TALK         Follow up:  Patient agrees to Care Plan and Follow-up.  Plan: The Managed Medicaid care management team will reach out to the patient again over the next 30 days.  [name], BSW, MSW, [name] Managed Medicaid LCSW [facility]  [name] [email or web address] Phone: [phone]

## 2023-01-27 NOTE — Patient Instructions (Addendum)
Visit Information  Mr. Ancrum was given information about Medicaid Managed Care team care coordination services as a part of their [facility] Community Plan Medicaid benefit. Gryffin Solow verbally consented to engagement with the [facility] Managed Care team.   If you are experiencing a medical emergency, please call 911 or report to your local emergency department or urgent care.   If you have a non-emergency medical problem during routine business hours, please contact your provider's office and ask to speak with a nurse.   For questions related to your [facility], please call: [phone] or visit the homepage here: [email or web address]  If you would like to schedule transportation through your [facility], please call the following number at least 2 days in advance of your appointment: [phone]   Rides for urgent appointments can also be made after hours by calling Member Services.  Call the Behavioral Health Crisis Line at [phone], at any time, 24 hours a day, 7 days a week. If you are in danger or need immediate medical attention call 911.  If you would like help to quit smoking, call 1-800-QUIT-NOW ((920)659-7557) OR Espaol: 1-855-Djelo-Ya (1-324-401-0272) o para ms informacin haga clic aqu or Text READY to 536-644 to register via text   Following is a copy of your plan of care:  Care Plan : LCSW Plan of Care  Updates made by [name], LCSW since 01/27/2023 12:00 AM     Problem: Quality of Life (General Plan of Care)      Goal: Quality of Life Maintained   Start Date: 01/06/2023  Note:   Priority: High  Timeframe:  Long-Range Goal Priority:  High Start Date:   01/06/23           Expected End Date:  ongoing                     Follow Up Date--02/04/23 at 1045 am  Current barriers:   Level of care concerns and need for SNF placement Physical  and mental health concerns  Need for FL2 Need for Financial Assistance and connection to available community resources Need for education on SNF placement process and education on facilities nearby in order to chose a facility they would like placement  Loss of recent caregiver support due to niece becoming ill (mother is now primary caregiver other than part time CNA that comes in) Update- Mother reports that family   Clinical Goals: Patient's mother we work with agencies/resources/PCP/Internal Medicine LCSW, [facility] LCSW to address needs related to gaining SNF placement and gaining desired physical therapy treatment at a safe level of care  Patient Goals/Self-Care Activities: Over the next 30 days Complete review of emailed resources  Call Social Services to discuss resources and support from [facility] caseworker Make a PCP appointment for [facility] completion      24- Hour Availability:    [facility]  [address] [place], [place] [phone] Crisis [phone]   Family Service of the [facility] [phone]  [place] [facility]  [phone]    [facility]  [phone] (after hours)   Therapeutic Alternative/Mobile Crisis   [phone]   [place] National Suicide Hotline  (339)823-3681 Len Childs) [place] [address]   Call 330-099-6567 for mental health emergencies   [facility]  [phone]);  [place] and [employer]  ([phone]); [place], [place], [place], [place], [place], [place], [address]  Health Urgent Care for [facility] Residents For 24/7 walk-up access to mental health services for [facility] children (4+), adolescents and adults, please visit the [facility] located at [address] in [place], [place].  *[place] also provides comprehensive outpatient behavioral health services in a variety of locations around the [place].  Connect  With [place] [address] [place], [place] [address] HelpLine: (416)783-1246 or 1-787-777-9039  Get Directions  Find Help 24/7 By Phone Call our 24-hour HelpLine at [phone] or 253 479 5720 for immediate assistance for mental health and substance abuse issues.  Walk-In Help [place]: [facility] (Ages 4 and Up) [place]: Emergency Dept., [facility] Additional Resources National Hopeline Network: 1-800-SUICIDE The National Suicide Prevention Lifeline: (517) 017-5058

## 2023-01-31 ENCOUNTER — Telehealth: Payer: Self-pay | Admitting: *Deleted

## 2023-01-31 NOTE — Telephone Encounter (Signed)
Call fro patient's mother requesting that the Clinics call [facility] to find out what records are needed for patient's transfer.  Check with Practice Manager Gentry Fitz. Patient's mother was informed that a release of information will need to be completed by his sister who is listed as his caregiver.  ROI can be signed at [facility] or the Clinics before information can be released.  Mother voiced understanding of the need to sign a ROI prior to record release.Marland Kitchen

## 2023-02-04 ENCOUNTER — Other Ambulatory Visit: Payer: Medicaid Other | Admitting: Licensed Clinical Social Worker

## 2023-02-04 NOTE — Patient Instructions (Signed)
Visit Information  Jeffrey Fitzgerald 's mother was given information about Medicaid Managed Care team care coordination services as a part of their [facility] Community Plan Medicaid benefit. Jeffrey Fitzgerald's mother verbally consented to engagement with the [facility] Managed Care team.   If you are experiencing a medical emergency, please call 911 or report to your local emergency department or urgent care.   If you have a non-emergency medical problem during routine business hours, please contact your provider's office and ask to speak with a nurse.   For questions related to your [facility], please call: [phone] or visit the homepage here: [email or web address]  If you would like to schedule transportation through your [facility], please call the following number at least 2 days in advance of your appointment: [phone]   Rides for urgent appointments can also be made after hours by calling Member Services.  Call the Behavioral Health Crisis Line at 2310180587, at any time, 24 hours a day, 7 days a week. If you are in danger or need immediate medical attention call 911.  If you would like help to quit smoking, call 1-800-QUIT-NOW ((810)371-8363) OR Espaol: 1-855-Djelo-Ya (3-474-259-5638) o para ms informacin haga clic aqu or Text READY to 756-433 to register via text Following is a copy of your plan of care:  Care Plan : LCSW Plan of Care  Updates made by [name], LCSW since 02/04/2023 12:00 AM     Problem: Quality of Life (General Plan of Care)      Goal: Quality of Life Maintained   Start Date: 01/06/2023  Note:   Priority: High  Timeframe:  Long-Range Goal Priority:  High Start Date:   01/06/23           Expected End Date:  ongoing                     Follow Up Date--02/15/23 at 10 am  Current barriers:   Level of care concerns and need for SNF  placement Physical and mental health concerns  Need for FL2 Need for Financial Assistance and connection to available community resources Need for education on SNF placement process and education on facilities nearby in order to chose a facility they would like placement  Loss of recent caregiver support due to niece becoming ill (mother is now primary caregiver other than part time CNA that comes in) Update- Mother reports that family   Clinical Goals: Patient's mother we work with agencies/resources/PCP/Internal Medicine LCSW, [facility] LCSW to address needs related to gaining SNF placement and gaining desired physical therapy treatment at a safe level of care  Patient Goals/Self-Care Activities: Over the next 30 days Complete review of emailed resources  Call Social Services to discuss resources and support from [facility] caseworker Make a PCP appointment for [facility] completion      24- Hour Availability:    [facility]  [address] [place], [place] [phone] Crisis [phone]   [facility] Service [facility] the [facility] [phone]  [place] Crisis Service  [phone]    [facility]  [phone] (after hours)   Therapeutic Alternative/Mobile Crisis   [phone]   [place] National Suicide Hotline  (612)525-4708 Len Childs) [place] 938   Call (305)031-7225 for mental health emergencies   [facility]  [phone]);  [place] and [employer]  [phone]); [place], [place], [employer], [place], [place], [place], [place]  Behavioral Health Urgent Care for [facility] Residents For 24/7 walk-up access to mental health services for [facility] children (4+), adolescents and adults, please visit the [facility] located at [address] in [place], [place].  *[place] also provides comprehensive outpatient behavioral health services in a variety of locations around  the [place].  Connect With [place] [address] [place], [place] [address] HelpLine: (339)403-5734 or 1-234-719-6067  Get Directions  Find Help 24/7 By Phone Call our 24-hour HelpLine at (219) 191-3915 or 801 030 7858 for immediate assistance for mental health and substance abuse issues.  Walk-In Help [place]: [facility] (Ages 4 and Up) [place]: Emergency Dept., [facility] Additional Resources National Hopeline Network: 1-800-SUICIDE The National Suicide Prevention Lifeline: 713-548-9454

## 2023-02-06 NOTE — Patient Outreach (Signed)
Medicaid Managed Care Social Work Note  02/04/2023 Name:  Jeffrey Fitzgerald MRN:  [id number] DOB:  [birth year]  Jeffrey Fitzgerald is an 52 y.o. year old male who is a primary patient of [name], DO.  The [facility] Managed Care Coordination team was consulted for assistance with:  Level of Care Concerns  Mr. [name] was given information about Medicaid Managed Care Coordination team services today. [name] Parent agreed to services and verbal consent obtained.  Engaged with patient  for by telephone forfollow up visit in response to referral for case management and/or care coordination services.   Assessments/Interventions:  Review of past medical history, allergies, medications, health status, including review of consultants reports, laboratory and other test data, was performed as part of comprehensive evaluation and provision of chronic care management services.  SDOH: (Social Determinant of Health) assessments and interventions performed: SDOH Interventions    Flowsheet Row Patient Outreach Telephone from 02/04/2023 in [place] POPULATION HEALTH DEPARTMENT Patient Outreach Telephone from 01/27/2023 in [facility] POPULATION HEALTH DEPARTMENT Patient Outreach Telephone from 01/06/2023 in [place] POPULATION HEALTH DEPARTMENT Office Visit from 09/22/2022 in [facility]  SDOH Interventions      Food Insecurity Interventions -- -- -- Intervention Not Indicated  Housing Interventions -- -- Intervention Not Indicated Intervention Not Indicated  Transportation Interventions -- -- -- Intervention Not Indicated  Utilities Interventions -- -- -- Intervention Not Indicated  Financial Strain Interventions -- -- -- Intervention Not Indicated  Stress Interventions Offered [employer], Provide Counseling Offered Community Wellness Resources, Provide Counseling  [per mother] Offered [employer], Provide Counseling --        Advanced Directives Status:  See Care Plan for related entries.  Care Plan                 No Known Allergies  Medications Reviewed Today     Reviewed by Adron Bene, MD (Resident) on 09/24/22 at 1526  Med List Status: <None>   Medication Order Taking? Sig Documenting Provider Last Dose Status Informant  acetaminophen (TYLENOL) 500 MG tablet [id number]  Take 1 tablet (500 mg total) by mouth every 6 (six) hours as needed for moderate pain. [name], DO  Active   amLODipine (NORVASC) 10 MG tablet [id number]  Take 1 tablet (10 mg total) by mouth daily. [name], DO  Active   aspirin EC 81 MG tablet [id number]  Take 1 tablet (81 mg total) by mouth daily. Swallow whole. [name], DO  Active   atorvastatin (LIPITOR) 80 MG tablet [id number]  Take 1 tablet (80 mg total) by mouth daily. [name], DO  Active   carvedilol (COREG) 12.5 MG tablet [id number]  TAKE 1 TABLET (12.5 MG TOTAL) BY MOUTH TWO (TWO) TIMES DAILY WITH A MEAL. [name], DO  Active   docusate sodium (COLACE) 100 MG capsule [id number]  Take 1 capsule (100 mg total) by mouth daily. [name], DO  Active   hydrALAZINE (APRESOLINE) 25 MG tablet [id number]  Take 1 tablet (25 mg total) by mouth 2 (two) times daily. [name], DO  Active   Nystatin (GERHARDT'S BUTT CREAM) CREA [id number]  Apply 1 Application topically as needed for irritation (apply to periarea as needed for excoriation). [name], DO  Active   polyethylene glycol [facility] / GLYCOLAX) packet 17 g [id number]   [name], DO  Active   sertraline (ZOLOFT) 25 MG tablet [id number]  Take 1 tablet (25 mg total) by mouth daily. [name], DO  Active             Patient Active Problem List   Diagnosis Date Noted   Pressure injury of sacral region, stage 1 01/22/2023   Scrotal erythema 01/22/2023   Physical deconditioning 01/12/2023   Abdominal hernia 09/24/2022   S/P aortobifemoral bypass surgery 06/29/2022    Depression 06/09/2022   Constipation 06/09/2022   S/P AKA (above knee amputation) bilateral (HCC) 06/09/2022   Malnutrition of moderate degree 05/29/2022   Muscle weakness of left upper extremity 05/27/2022   Tobacco dependence 05/27/2022   Adjustment reaction 05/27/2022   DNR (do not resuscitate) 05/27/2022   Acute postoperative anemia due to expected blood loss    Gastroesophageal reflux disease    Pressure injury of skin 04/13/2022   PAD (peripheral artery disease) (HCC) 04/07/2022   Critical limb ischemia of both lower extremities (HCC) 04/01/2022   Iliac artery occlusion, 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   Hypertension 01/25/2020   Priority: High  Timeframe:  Long-Range Goal Priority:  High Start Date:   01/06/23           Expected End Date:  ongoing                     Follow Up Date--02/15/23 at 10 am  Current barriers:   Level of care concerns and need for SNF placement Physical and mental health concerns  Need for FL2 Need for Financial Assistance and connection to available community resources Need for education on SNF placement process and education on facilities nearby in order to chose a facility they would like placement  Loss of recent caregiver support due to niece becoming ill (mother is now primary caregiver other than part time CNA that comes in) Update- Mother reports that family   Clinical Goals: Patient's mother we work with agencies/resources/PCP/Internal Medicine LCSW, [facility] LCSW to address needs related to gaining SNF placement and gaining desired physical therapy treatment at a safe level of care  Clinical Interventions:  Inter-disciplinary care team collaboration (see longitudinal plan of care) Parent provided patient history Assessment of needs, progress, barriers , and agencies contacted  Assessed need for SNF placement  Advised patient's caregivers to answer all calls from care providers and to keep phone nearby. Advised parents to contact 911 or [phone] if a  crisis occurs.  Clinical interventions provided:Solution-Focused Strategies, Active listening / Reflection utilized , Problem Solving [name] ,  Past update-Two emails sent to patient's family with several documents regarding the ALF placement process, a complete list of ALF's in the area, and Medicaid transfer educational materials. 01/12/23- [facility] LCSW completed call to mother today and was informed that patient completed PCP visit yesterday and a HH referral was made but that she received a call today regarding this referral stating that since patient has no prosthesis, he is not eligible for physical therapy. I first spoke with patient's family on 01/06/23 and family seemed on board with long term ALF placement but after disclosing today that the state would take over patient's income once Medicaid transitions from community Medicaid to nursing facility Medicaid, family report that they just wish for patient to be able to get stronger and be able to live in the home independently. Patient's previous primary caregiver [name], his niece is unable to provide as much case now so family is limited in support which was reminded to family. Family does have PCS involvement and a regular aide who they really like. [facility] LCSW will route encounter to PCP and PCP's  LCSW and follow up next week. 01/20/23 update- Patient's mother reports that [facility] has started and wound care has been ordered by PCP. Mother is requesting assistance with SNF placement. [facility] LCSW provided education on requirements for short term placement. [facility] LCSW advised patient will need to be recommended for SNF placement but PT (this SNF recommendation has recently been documented in patient's chart) and a FL2 document signed by a physician. [facility] LCSW had mother physically write down some reminders: all apfuture appointments and to utilize [facility] benefits program and call the number on the back of patient's insurance card. [facility] LCSW completed care coordination with PCP  through secure chat. Update 01/27/23- Patient's mother reports that she still has not received email with resources that [facility] LCSW sent 3 different times. Patient's mother contacted [name] and received correct email and [facility] LCSW resent resources out to this correct email [email or web address] on 01/27/23. [facility] LCSW will follow up in in 2 weeks. Update- Patient's mother reports that they have decided to go with [facility] but that patient's legal guardian (niece) will have to go and sign medical record forms in order to release information. However, niece is having a procedure down tomorrow and is experiencing health issues herself and will not be able to do this until next week. [facility] LCSW provided education on the SNF placement process to mother. Family is agreeable to [facility] LCSW making next follow up call with patient's Niece instead of patient's Mother.    Patient Goals/Self-Care Activities: Over the next 30 days Complete review of emailed resources  Call Social Services to discuss resources and support from [facility] caseworker Make a PCP appointment for [facility] completion      24- Hour Availability:    [facility]  [address] [place], [place] [phone] Crisis [phone]   [facility] the [facility] [phone]  [place] Crisis [facility]  [phone]    [facility]  [phone] (after hours)   Therapeutic Alternative/Mobile Crisis   [phone]   [place] National Suicide Hotline  (419) 602-4043 Len Childs) [place] [address]   Call [phone])605-9790 for mental health emergencies   [facility]  [phone]);  [place] and [employer]  [phone]); [place], [place], [place], [place], [place], [place], [place]    [place] [facility] Urgent Care for [facility] Residents For 24/7 walk-up access to mental health services for [facility] children (4+), adolescents and adults, please visit the [facility] located at [address] in [place], [place].  * also provides comprehensive outpatient behavioral health services in a variety of locations around the [place].  Connect With [place] [address] [place], [place] [address] HelpLine: (319) 035-0153 or 1-986-632-8512  Get Directions  Find Help 24/7 By Phone Call our 24-hour HelpLine at 986-174-0919 or (469) 728-1133 for immediate assistance for mental health and substance abuse issues.  Walk-In Help [place]: [facility] (Ages 4 and Up) [place]: Emergency Dept., [facility] Additional Resources National Hopeline Network: 1-800-SUICIDE The National Suicide Prevention Lifeline: 1-800-273-TALK      Follow up:  Patient agrees to Care Plan and Follow-up.  Plan: The Managed Medicaid care management team will reach out to the patient again over the next 30 days.  Date/time of next scheduled Social Work care management/care coordination outreach:  02/15/23 at 10 am  [name], BSW, MSW, [name] Managed Medicaid LCSW [facility]  [place] [employer] [email or web address] Phone: [phone]

## 2023-02-07 ENCOUNTER — Other Ambulatory Visit: Payer: Self-pay | Admitting: Student

## 2023-02-07 DIAGNOSIS — I10 Essential (primary) hypertension: Secondary | ICD-10-CM

## 2023-02-07 DIAGNOSIS — K5900 Constipation, unspecified: Secondary | ICD-10-CM

## 2023-02-07 DIAGNOSIS — F32A Depression, unspecified: Secondary | ICD-10-CM

## 2023-02-07 DIAGNOSIS — I739 Peripheral vascular disease, unspecified: Secondary | ICD-10-CM

## 2023-02-08 ENCOUNTER — Other Ambulatory Visit: Payer: Self-pay | Admitting: Student

## 2023-02-08 ENCOUNTER — Other Ambulatory Visit: Payer: Self-pay

## 2023-02-08 ENCOUNTER — Ambulatory Visit (INDEPENDENT_AMBULATORY_CARE_PROVIDER_SITE_OTHER): Payer: Medicaid Other | Admitting: Licensed Clinical Social Worker

## 2023-02-08 ENCOUNTER — Other Ambulatory Visit (HOSPITAL_COMMUNITY): Payer: Self-pay

## 2023-02-08 DIAGNOSIS — I10 Essential (primary) hypertension: Secondary | ICD-10-CM

## 2023-02-08 DIAGNOSIS — F32A Depression, unspecified: Secondary | ICD-10-CM

## 2023-02-08 MED ORDER — AMLODIPINE BESYLATE 5 MG PO TABS
5.0000 mg | ORAL_TABLET | Freq: Every day | ORAL | 0 refills | Status: DC
Start: 2023-02-08 — End: 2023-02-21
  Filled 2023-02-08: qty 30, 30d supply, fill #0

## 2023-02-08 MED ORDER — CARVEDILOL 12.5 MG PO TABS
12.5000 mg | ORAL_TABLET | Freq: Two times a day (BID) | ORAL | 0 refills | Status: DC
Start: 2023-02-08 — End: 2023-02-21
  Filled 2023-02-08: qty 60, 30d supply, fill #0

## 2023-02-08 MED ORDER — AMLODIPINE BESYLATE 10 MG PO TABS
10.0000 mg | ORAL_TABLET | Freq: Every day | ORAL | 0 refills | Status: DC
Start: 2023-02-08 — End: 2023-02-08
  Filled 2023-02-08: qty 30, 30d supply, fill #0

## 2023-02-08 NOTE — Addendum Note (Signed)
Addended by: [name] on: 02/08/2023 03:01 PM   Modules accepted: Orders

## 2023-02-08 NOTE — BH Specialist Note (Signed)
[facility] reviewed chart. Patient is being assisted by LCSW- [name] and Managed Medicaid working  with agencies/resources/PCP/Internal Medicine LCSW, [facility] LCSW to address needs related to gaining SNF placement and gaining desired physical therapy treatment at a safe level of care   Patient Goals/Self-Care Activities: Over the next 30 days Complete review of emailed resources  Call Social Services to discuss resources and support from [facility] caseworker Make a PCP appointment for [facility] completion           Plan: The Managed Medicaid care management team will reach out to the patient again over the next 30 days.   Date/time of next scheduled Social Work care management/care coordination outreach:  02/15/23 at 10 am   Jeffrey Fitzgerald, MSW, LCSW-A She/Her Behavioral Health Clinician [facility]  Internal Medicine Center Direct Dial:[phone]  Fax [phone] Main Office Phone: [phone] [address] [place], [place] [address] Website: [facility] Internal Medicine [facility]  [place], [place]  [place]

## 2023-02-10 ENCOUNTER — Encounter (HOSPITAL_BASED_OUTPATIENT_CLINIC_OR_DEPARTMENT_OTHER): Payer: Medicaid Other | Attending: General Surgery | Admitting: General Surgery

## 2023-02-10 DIAGNOSIS — Z89612 Acquired absence of left leg above knee: Secondary | ICD-10-CM | POA: Insufficient documentation

## 2023-02-10 DIAGNOSIS — Z89611 Acquired absence of right leg above knee: Secondary | ICD-10-CM | POA: Diagnosis not present

## 2023-02-10 DIAGNOSIS — F17200 Nicotine dependence, unspecified, uncomplicated: Secondary | ICD-10-CM | POA: Insufficient documentation

## 2023-02-10 DIAGNOSIS — I739 Peripheral vascular disease, unspecified: Secondary | ICD-10-CM | POA: Insufficient documentation

## 2023-02-10 DIAGNOSIS — I10 Essential (primary) hypertension: Secondary | ICD-10-CM | POA: Insufficient documentation

## 2023-02-12 NOTE — Progress Notes (Signed)
[name], [name] ([id number]) [id number] Page 1 of 4 Visit Report for 02/10/2023 Abuse Risk Screen Details Patient Name: Date of Service: Jeffrey Fitzgerald, Jeffrey Fitzgerald 02/10/2023 8:00 A M Medical Record Number: [id number] Patient Account Number: [id number] Date of Birth/Sex: Treating RN: 1971-07-20 (52 y.o. Jeffrey Fitzgerald Primary Care Monna Crean: [name] Other Clinician: Referring Yides Saidi: Treating Anela Bensman/Extender: Selena Lesser in Treatment: 0 Abuse Risk Screen Items Answer ABUSE RISK SCREEN: Has anyone close to you tried to hurt or harm you recentlyo No Do you feel uncomfortable with anyone in your familyo No Has anyone forced you do things that you didnt want to doo No Electronic Signature(s) Signed: 02/10/2023 3:55:24 PM By: Brenton Grills Entered By: Brenton Grills on 02/10/2023 08:23:02 -------------------------------------------------------------------------------- Activities of Daily Living Details Patient Name: Date of Service: Jeffrey Fitzgerald, Jeffrey Fitzgerald 02/10/2023 8:00 A M Medical Record Number: [id number] Patient Account Number: [id number] Date of Birth/Sex: Treating RN: Nov 03, 1970 (52 y.o. Jeffrey Fitzgerald Primary Care Ladarrius Bogdanski: [name] Other Clinician: Referring Tyke Outman: Treating Darvin Dials/Extender: Selena Lesser in Treatment: 0 Activities of Daily Living Items Answer Activities of Daily Living (Please select one for each item) Drive Automobile Not Able T Medications ake Completely Able Use T elephone Completely Able Care for Appearance Need Assistance Use T oilet Need Assistance Bath / Shower Need Assistance Dress Self Need Assistance Feed Self Need Assistance Walk Need Assistance Get In / Out Bed Need Assistance Housework Need Assistance Prepare Meals Need Assistance Handle Money Need Assistance Shop for Self Need Assistance Electronic Signature(s) Signed: 02/10/2023  3:55:24 PM By: Brenton Grills Entered By: Brenton Grills on 02/10/2023 08:24:47 Salmons, Korrey ([id number]) [id number] Page 2 of 4 -------------------------------------------------------------------------------- Education Screening Details Patient Name: Date of Service: Jeffrey Fitzgerald, Jeffrey Fitzgerald 02/10/2023 8:00 A M Medical Record Number: [id number] Patient Account Number: [id number] Date of Birth/Sex: Treating RN: 1970-12-03 (52 y.o. Jeffrey Fitzgerald Primary Care Katheline Brendlinger: [name] Other Clinician: Referring Floyd Wade: Treating Rishab Stoudt/Extender: Selena Lesser in Treatment: 0 Learning Preferences/Education Level/Primary Language Highest Education Level: High School Preferred Language: English Cognitive Barrier Language Barrier: No Translator Needed: No Memory Deficit: No Emotional Barrier: No Cultural/Religious Beliefs Affecting Medical Care: No Physical Barrier Impaired Vision: No Impaired Hearing: No Decreased Hand dexterity: No Knowledge/Comprehension Knowledge Level: Medium Comprehension Level: Medium Ability to understand written instructions: Medium Ability to understand verbal instructions: Medium Motivation Anxiety Level: Calm Cooperation: Cooperative Education Importance: Acknowledges Need Interest in Health Problems: Asks Questions Perception: Coherent Willingness to Engage in Self-Management High Activities: Readiness to Engage in Self-Management High Activities: Electronic Signature(s) Signed: 02/10/2023 3:55:24 PM By: Brenton Grills Entered By: Brenton Grills on 02/10/2023 08:25:40 -------------------------------------------------------------------------------- Fall Risk Assessment Details Patient Name: Date of Service: Jeffrey Fitzgerald, Jeffrey Fitzgerald 02/10/2023 8:00 A M Medical Record Number: [id number] Patient Account Number: [id number] Date of Birth/Sex: Treating RN: Jul 28, 1971 (52 y.o. Jeffrey Fitzgerald Primary Care Bastion Bolger: [name] Other Clinician: Referring Rubbie Goostree: Treating Harper Smoker/Extender: Selena Lesser in Treatment: 0 Fall Risk Assessment Items Have you had 2 or more falls in the last 12 monthso 0 No Have you had any fall that resulted in injury in the last 12 monthso 0 No FALLS RISK SCREEN Raulston, Javarus ([id number]) [id number] Page 3 of 4 History of falling - immediate or within 3 months 0 No Secondary diagnosis (Do you have 2 or more medical diagnoseso) 0 No Ambulatory aid None/bed rest/wheelchair/nurse 0 No Crutches/cane/walker 0 No Furniture 0 No Intravenous therapy Access/Saline/Heparin Lock 0 No Gait/Transferring Normal/ bed rest/  wheelchair 0 No Weak (short steps with or without shuffle, stooped but able to lift head while walking, may seek 0 No support from furniture) Impaired (short steps with shuffle, may have difficulty arising from chair, head down, impaired 0 No balance) Mental Status Oriented to own ability 0 No Electronic Signature(s) Signed: 02/10/2023 3:55:24 PM By: Brenton Grills Entered By: Brenton Grills on 02/10/2023 08:25:49 -------------------------------------------------------------------------------- Foot Assessment Details Patient Name: Date of Service: Jeffrey Fitzgerald, Jeffrey Fitzgerald 02/10/2023 8:00 A M Medical Record Number: [id number] Patient Account Number: [id number] Date of Birth/Sex: Treating RN: 08/05/1971 (52 y.o. Jeffrey Fitzgerald Primary Care Damier Disano: [name] Other Clinician: Referring Annaleah Arata: Treating Jasmain Ahlberg/Extender: Selena Lesser in Treatment: 0 Foot Assessment Items [x]  Unable to perform right foot assessment due to amputation [x]  Unable to perform left foot assessment due to amputation Site Locations + = Sensation present, - = Sensation absent, C = Callus, U = Ulcer R = Redness, W = Warmth, M = Maceration, PU =  Pre-ulcerative lesion F = Fissure, S = Swelling, D = Dryness Assessment Right: Left: Other Deformity: Prior Foot Ulcer: Prior Amputation: Charcot Joint: Ambulatory Status: Non-ambulatory Assistance Device: Wheelchair [place], [name] ([id number]) [id number] Page 4 of 4 Gait: Electronic Signature(s) Signed: 02/10/2023 3:55:24 PM By: Brenton Grills Entered By: Brenton Grills on 02/10/2023 08:26:32 -------------------------------------------------------------------------------- Nutrition Risk Screening Details Patient Name: Date of Service: Jeffrey Fitzgerald, Jeffrey Fitzgerald 02/10/2023 8:00 A M Medical Record Number: [id number] Patient Account Number: [id number] Date of Birth/Sex: Treating RN: 04-15-1971 (52 y.o. Jeffrey Fitzgerald Primary Care Sylvestre Rathgeber: [name] Other Clinician: Referring Shaunna Rosetti: Treating Leyah Bocchino/Extender: Selena Lesser in Treatment: 0 Height (in): Weight (lbs): Body Mass Index (BMI): Nutrition Risk Screening Items Score Screening NUTRITION RISK SCREEN: I have an illness or condition that made me change the kind and/or amount of food I eat 0 No I eat fewer than two meals per day 0 No I eat few fruits and vegetables, or milk products 0 No I have three or more drinks of beer, liquor or wine almost every day 0 No I have tooth or mouth problems that make it hard for me to eat 0 No I don't always have enough money to buy the food I need 0 No I eat alone most of the time 0 No I take three or more different prescribed or over-the-counter drugs a day 0 No Without wanting to, I have lost or gained 10 pounds in the last six months 0 No I am not always physically able to shop, cook and/or feed myself 0 No Nutrition Protocols Good Risk Protocol Moderate Risk Protocol High Risk Proctocol Risk Level: Good Risk Score: 0 Electronic Signature(s) Signed: 02/10/2023 3:55:24 PM By: Brenton Grills Entered By: Brenton Grills  on 02/10/2023 08:25:54

## 2023-02-12 NOTE — Progress Notes (Signed)
EVERTTE, UNKEFER ([id number]) [id number] Page 1 of 6 Visit Report for 02/10/2023 Chief Complaint Document Details Patient Name: Date of Service: Jeffrey Fitzgerald, Jeffrey Fitzgerald 02/10/2023 8:00 A M Medical Record Number: [id number] Patient Account Number: [id number] Date of Birth/Sex: Treating RN: 1971-04-20 (52 y.o. M) Primary Care Provider: [name] Other Clinician: Referring Provider: Treating Provider/Extender: Selena Lesser in Treatment: 0 Information Obtained from: Patient Electronic Signature(s) Signed: 02/10/2023 9:23:57 AM By: Duanne Guess MD FACS Previous Signature: 02/10/2023 8:31:40 AM Version By: Duanne Guess MD FACS Entered By: Duanne Guess on 02/10/2023 09:23:56 -------------------------------------------------------------------------------- HPI Details Patient Name: Date of Service: Jeffrey Fitzgerald, Jeffrey Fitzgerald 02/10/2023 8:00 A M Medical Record Number: [id number] Patient Account Number: [id number] Date of Birth/Sex: Treating RN: 04-19-1971 (52 y.o. M) Primary Care Provider: [name] Other Clinician: Referring Provider: Treating Provider/Extender: Selena Lesser in Treatment: 0 History of Present Illness HPI Description: CONSULTATION ONLY 02/10/2023 This is a 52 year old nondiabetic who first presented to the vascular surgery service in August 2023. He had critical limb ischemia and after a multitude of operative interventions, he ultimately required bilateral above-knee amputations. He was referred to the wound care center because he had developed some skin breakdown on his sacrum and perineum. Apparently part of the issue is that he has not been able to get full-time home health care and does not have the ability to transfer to a bedside commode so he is sometimes left lying in a soiled brief. He is accompanied today by the home health aide that he has for a 2- hour. Each day. She says that  they were able to get his wounds to heal using Aquaphor. They are interested in any suggestions we might have to prevent future episodes of tissue breakdown. Electronic Signature(s) Signed: 02/10/2023 9:27:17 AM By: Duanne Guess MD FACS Entered By: Duanne Guess on 02/10/2023 09:27:16 -------------------------------------------------------------------------------- Physical Exam Details Patient Name: Date of Service: Jeffrey Fitzgerald, Jeffrey Fitzgerald 02/10/2023 8:00 A M Medical Record Number: [id number] Patient Account Number: [id number] Date of Birth/Sex: Treating RN: Jun 06, 1971 (52 y.o. M) Primary Care Provider: [name] Other Clinician: Referring Provider: Treating Provider/Extender: Selena Lesser in Treatment: 0 Fitzgerald, Jeffrey ([id number]) [id number] Page 2 of 6 Constitutional Slightly hypertensive. . . . No acute distress. Respiratory Normal work of breathing on room air. Cardiovascular Bilateral above-knee amputations.. Notes No open wounds at this time. Examination of the sacral area, buttocks, and perineum show evidence of previous injury that has now healed. Electronic Signature(s) Signed: 02/10/2023 9:28:23 AM By: Duanne Guess MD FACS Entered By: Duanne Guess on 02/10/2023 24:40:10 -------------------------------------------------------------------------------- Physician Orders Details Patient Name: Date of Service: Jeffrey Fitzgerald, Jeffrey Fitzgerald 02/10/2023 8:00 A M Medical Record Number: [id number] Patient Account Number: [id number] Date of Birth/Sex: Treating RN: 08-20-1971 (52 y.o. [name] Primary Care Provider: [name] Other Clinician: Referring Provider: Treating Provider/Extender: Selena Lesser in Treatment: 0 Verbal / Phone Orders: No Diagnosis Coding ICD-10 Coding Code Description [phone] Acquired absence of right leg above knee [phone] Acquired absence of left leg  above knee I73.9 Peripheral vascular disease, unspecified Z72.0 Tobacco use Discharge From [facility] Services Discharge from Wound Care Center - Please call us if you need [place]. Congratulations on healing! Bathing/ Shower/ Hygiene May shower and wash wound with soap and water. Additional Orders / Instructions Stop/Decrease Smoking Follow Nutritious Diet - Get lots of protein (100g per day) and water in your diet. Use protein shakes to supplement. Other: - Use Desitin on peri area to prevent skin  breakdown. Use layers of 2-3 incontinence pads and remove any wet layers during the night as needed. May also consider multiple urinals. Electronic Signature(s) Signed: 02/10/2023 9:28:34 AM By: Duanne Guess MD FACS Entered By: Duanne Guess on 02/10/2023 09:28:34 -------------------------------------------------------------------------------- Problem List Details Patient Name: Date of Service: Jeffrey Fitzgerald, Jeffrey Fitzgerald 02/10/2023 8:00 A M Medical Record Number: [id number] Patient Account Number: [id number] Date of Birth/Sex: Treating RN: [birth year] (52 y.o. Jeffrey Fitzgerald, Jeffrey Fitzgerald ([id number]) [id number] Page 3 of 6 Primary Care Provider: [name] Other Clinician: Referring Provider: Treating Provider/Extender: Selena Lesser in Treatment: 0 Active Problems ICD-10 Encounter Code Description Active Date MDM Diagnosis Z89.611 Acquired absence of right leg above knee 02/10/2023 No Yes Z89.612 Acquired absence of left leg above knee 02/10/2023 No Yes I73.9 Peripheral vascular disease, unspecified 02/10/2023 No Yes Z72.0 Tobacco use 02/10/2023 No Yes Inactive Problems Resolved Problems Electronic Signature(s) Signed: 02/10/2023 9:23:32 AM By: Duanne Guess MD FACS Previous Signature: 02/10/2023 8:31:25 AM Version By: Duanne Guess MD FACS Entered By: Duanne Guess on 02/10/2023  09:23:32 -------------------------------------------------------------------------------- Progress Note Details Patient Name: Date of Service: Jeffrey Fitzgerald, Jeffrey Fitzgerald 02/10/2023 8:00 A M Medical Record Number: [id number] Patient Account Number: [id number] Date of Birth/Sex: Treating RN: 25-Aug-1971 (52 y.o. M) Primary Care Provider: [name] Other Clinician: Referring Provider: Treating Provider/Extender: Selena Lesser in Treatment: 0 Subjective Chief Complaint Information obtained from Patient History of Present Illness (HPI) CONSULTATION ONLY 02/10/2023 This is a 52 year old nondiabetic who first presented to the vascular surgery service in August 2023. He had critical limb ischemia and after a multitude of operative interventions, he ultimately required bilateral above-knee amputations. He was referred to the wound care center because he had developed some skin breakdown on his sacrum and perineum. Apparently part of the issue is that he has not been able to get full-time home health care and does not have the ability to transfer to a bedside commode so he is sometimes left lying in a soiled brief. He is accompanied today by the home health aide that he has for a 2- hour. Each day. She says that they were able to get his wounds to heal using Aquaphor. They are interested in any suggestions we might have to prevent future episodes of tissue breakdown. Patient History Allergies No Known Allergies Family History Cancer, Heart Disease - Father, Hypertension - Father. Social History Current every day smoker, Alcohol Use - Never, Drug Use - No History, Caffeine Use - Daily. TAMI, TARDO ([id number]) [id number] Page 4 of 6 Medical History Cardiovascular Patient has history of Hypertension, Peripheral Arterial Disease Hospitalization/Surgery History - bilateral amputations. Objective Constitutional Slightly hypertensive. No  acute distress. Vitals Time Taken: 8:19 AM, Temperature: 98.6 F, Pulse: 64 bpm, Respiratory Rate: 18 breaths/min, Blood Pressure: 144/80 mmHg. Respiratory Normal work of breathing on room air. Cardiovascular Bilateral above-knee amputations.. General Notes: No open wounds at this time. Examination of the sacral area, buttocks, and perineum show evidence of previous injury that has now healed. Integumentary (Hair, Skin) Wound #1 status is Healed - Epithelialized. Original cause of wound was Gradually Appeared. The date acquired was: 09/30/2022. The wound is located on the Sacrum. The wound measures 0cm length x 0cm width x 0cm depth; 0cm^2 area and 0cm^3 volume. There is no tunneling or undermining noted. There is a none present amount of drainage noted. There is no granulation within the wound bed. There is no necrotic tissue within the wound bed. The periwound skin appearance had no abnormalities noted for moisture.  The periwound skin appearance had no abnormalities noted for color. The periwound skin appearance exhibited: Scarring. General Notes: No open wound noted. 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 Congratulations on healing! Bathing/ Shower/ Hygiene: May shower and wash wound with soap and water. Additional Orders / Instructions: Stop/Decrease Smoking Follow Nutritious Diet - Get lots of protein (100g per day) and water in your diet. Use protein shakes to supplement. Other: - Use Desitin on peri area to prevent skin breakdown. Use layers of 2-3 incontinence pads and remove any wet layers during the night as needed. May also consider multiple urinals. This is a 52 year old vasculopath status post bilateral above-knee amputations. He does not have any  wounds at this time. Given his limited ability to transfer, should he develop wounds in the future, he would benefit from a low-air-loss mattress bed. In the meantime, optimizing his nutrition with adequate protein intake (100 g/day), zinc, vitamin C, and protein shakes as supplements would be beneficial. Of course the most beneficial thing for him would be to quit smoking, but he does not seem interested in doing so at this time. We did an experiment in clinic to see if he was able to shift his weight enough to pull a clean Chux pad under his body and he was able to do so, so this may be a way to help him stay clean and dry when he is unable to be changed by another individual. I also advised using Desitin or zinc oxide to his perineal area to help avoid tissue maceration. As he does not have any open wounds at this time, he does not need to follow-up. He is certainly welcome to return should any occur. Electronic Signature(s) Signed: 02/10/2023 9:33:14 AM By: Duanne Guess MD FACS Entered By: Duanne Guess on 02/10/2023 09:33:14 Jeffrey Fitzgerald, Jeffrey Fitzgerald ([id number]) [id number] Page 5 of 6 -------------------------------------------------------------------------------- HxROS Details Patient Name: Date of Service: Jeffrey Fitzgerald, Jeffrey Fitzgerald 02/10/2023 8:00 A M Medical Record Number: [id number] Patient Account Number: [id number] Date of Birth/Sex: Treating RN: [birth year] (52 y.o. [name] Primary Care Provider: [name] Other Clinician: Referring Provider: Treating Provider/Extender: Selena Lesser in Treatment: 0 Cardiovascular Medical History: Positive for: Hypertension; Peripheral Arterial Disease Immunizations Implantable Devices No devices added Hospitalization / Surgery History Type of Hospitalization/Surgery bilateral amputations Family and Social History Cancer: Yes; Heart Disease: Yes - Father; Hypertension: Yes -  Father; Current every day smoker; Alcohol Use: Never; Drug Use: No History; Caffeine Use: Daily; Financial Concerns: No; Food, Clothing or Shelter Needs: No; Support System Lacking: No; Transportation Concerns: No Electronic Signature(s) Signed: 02/10/2023 12:40:38 PM By: Duanne Guess MD FACS Signed: 02/10/2023 3:55:24 PM By: Brenton Grills Entered By: Brenton Grills on 02/10/2023 08:22:51 -------------------------------------------------------------------------------- SuperBill Details Patient Name: Date of Service: Jeffrey Fitzgerald, Jeffrey Fitzgerald 02/10/2023 Medical Record Number: [id number] Patient Account Number: [id number] Date of Birth/Sex: Treating RN: Sep 16, 1970 (52 y.o. [name] Primary Care Provider: [name] Other Clinician: Referring Provider: Treating Provider/Extender: Selena Lesser in Treatment: 0 Diagnosis Coding ICD-10 Codes Code Description [phone] Acquired absence of right leg above knee [phone] Acquired absence of left leg above knee I73.9 Peripheral vascular disease, unspecified Z72.0 Tobacco use Facility Procedures Physician Procedures : CPT4 Code Description Modifier [id number] WC PHYS LEVEL 3 NEW PT ICD-10 Diagnosis Description Z89.611  Acquired absence of right leg above knee Z89.612 Acquired absence of left leg above knee I73.9 Peripheral vascular disease, unspecified Z72.0 Tobacco use Quantity: 1 Electronic Signature(s) Signed: 02/10/2023 9:33:34 AM By: Duanne Guess MD FACS Entered By: Duanne Guess on 02/10/2023 09:33:34

## 2023-02-12 NOTE — Progress Notes (Signed)
KEEGHAN, KUDER ([id number]) [id number] Page 1 of 7 Visit Report for 02/10/2023 Allergy List Details Patient Name: Date of Service: Jeffrey Fitzgerald, Jeffrey Fitzgerald 02/10/2023 8:00 A M Medical Record Number: [id number] Patient Account Number: [id number] Date of Birth/Sex: Treating RN: Apr 15, 1971 (52 y.o. Jeffrey Fitzgerald Primary Care Rosanna Bickle: [name] Other Clinician: Referring Lavilla Delamora: Treating Morgann Woodburn/Extender: Selena Lesser in Treatment: 0 Allergies Active Allergies No Known Allergies Allergy Notes Electronic Signature(s) Signed: 02/10/2023 3:55:24 PM By: Brenton Grills Entered By: Brenton Grills on 02/10/2023 08:20:37 -------------------------------------------------------------------------------- Arrival Information Details Patient Name: Date of Service: Jeffrey Fitzgerald, Jeffrey Fitzgerald 02/10/2023 8:00 A M Medical Record Number: [id number] Patient Account Number: [id number] Date of Birth/Sex: Treating RN: 1971-04-25 (52 y.o. Jeffrey Fitzgerald Primary Care Ketra Duchesne: [name] Other Clinician: Referring Quynh Basso: Treating Ezriel Boffa/Extender: Selena Lesser in Treatment: 0 Visit Information Patient Arrived: Wheel Chair Arrival Time: 08:08 Accompanied By: caregiver Transfer Assistance: Michiel Sites Lift Patient Identification Verified: Yes Secondary Verification Process Completed: Yes Patient Requires Transmission-Based Precautions: No Patient Has Alerts: No Electronic Signature(s) Signed: 02/10/2023 3:55:24 PM By: Brenton Grills Entered By: Brenton Grills on 02/10/2023 08:18:58 -------------------------------------------------------------------------------- Clinic Level of Care Assessment Details Patient Name: Date of Service: Jeffrey Fitzgerald, Jeffrey Fitzgerald 02/10/2023 8:00 A M Medical Record Number: [id number] Patient Account Number: [id number] Jeffrey Fitzgerald, Jeffrey Fitzgerald ([id number]) [id number] Page 2 of 7 Date  of Birth/Sex: Treating RN: 1970-12-17 (52 y.o. Jeffrey Fitzgerald Primary Care Saharsh Sterling: [name] Other Clinician: Referring Quanetta Truss: Treating Blaise Grieshaber/Extender: Selena Lesser in Treatment: 0 Clinic Level of Care Assessment Items TOOL 2 Quantity Score X- 1 0 Use when only an EandM is performed on the INITIAL visit ASSESSMENTS - Nursing Assessment / Reassessment X- 1 20 General Physical Exam (combine w/ comprehensive assessment (listed just below) when performed on new pt. evals) X- 1 25 Comprehensive Assessment (HX, ROS, Risk Assessments, Wounds Hx, etc.) ASSESSMENTS - Wound and Skin A ssessment / Reassessment X - Simple Wound Assessment / Reassessment - one wound 1 5 X- 1 5 Complex Wound Assessment / Reassessment - multiple wounds X- 1 10 Dermatologic / Skin Assessment (not related to wound area) ASSESSMENTS - Ostomy and/or Continence Assessment and Care X- 1 10 Incontinence Assessment and Management []  - 0 Ostomy Care Assessment and Management (repouching, etc.) PROCESS - Coordination of Care X - Simple Patient / Family Education for ongoing care 1 15 []  - 0 Complex (extensive) Patient / Family Education for ongoing care []  - 0 Staff obtains Chiropractor, Records, T Results / Process Orders est []  - 0 Staff telephones HHA, Nursing Homes / Clarify orders / etc []  - 0 Routine Transfer to another Facility (non-emergent condition) []  - 0 Routine Hospital Admission (non-emergent condition) []  - 0 New Admissions / Manufacturing engineer / Ordering NPWT Apligraf, etc. , []  - 0 Emergency Hospital Admission (emergent condition) X- 1 10 Simple Discharge Coordination []  - 0 Complex (extensive) Discharge Coordination PROCESS - Special Needs []  - 0 Pediatric / Minor Patient Management []  - 0 Isolation Patient Management []  - 0 Hearing / Language / Visual special needs []  - 0 Assessment of Community assistance (transportation, D/C planning,  etc.) []  - 0 Additional assistance / Altered mentation []  - 0 Support Surface(s) Assessment (bed, cushion, seat, etc.) INTERVENTIONS - Wound Cleansing / Measurement []  - 0 Wound Imaging (photographs - any number of wounds) []  - 0 Wound Tracing (instead of photographs) []  - 0 Simple Wound Measurement - one wound []  - 0 Complex Wound Measurement - multiple wounds []  -  0 Simple Wound Cleansing - one wound []  - 0 Complex Wound Cleansing - multiple wounds INTERVENTIONS - Wound Dressings []  - 0 Small Wound Dressing one or multiple wounds []  - 0 Medium Wound Dressing one or multiple wounds []  - 0 Large Wound Dressing one or multiple wounds []  - 0 Application of Medications - injection Plott, Thaison ([id number]) [id number] Page 3 of 7 INTERVENTIONS - Miscellaneous []  - 0 External ear exam []  - 0 Specimen Collection (cultures, biopsies, blood, body fluids, etc.) []  - 0 Specimen(s) / Culture(s) sent or taken to Lab for analysis []  - 0 Patient Transfer (multiple staff / Michiel Sites Lift / Similar devices) []  - 0 Simple Staple / Suture removal (25 or less) []  - 0 Complex Staple / Suture removal (26 or more) []  - 0 Hypo / Hyperglycemic Management (close monitor of Blood Glucose) []  - 0 Ankle / Brachial Index (ABI) - do not check if billed separately Has the patient been seen at the hospital within the last three years: Yes Total Score: 100 Level Of Care: New/Established - Level 3 Electronic Signature(s) Signed: 02/10/2023 3:55:24 PM By: Brenton Grills Entered By: Brenton Grills on 02/10/2023 08:50:26 -------------------------------------------------------------------------------- Encounter Discharge Information Details Patient Name: Date of Service: Jeffrey Fitzgerald, Jeffrey Fitzgerald 02/10/2023 8:00 A M Medical Record Number: [id number] Patient Account Number: [id number] Date of Birth/Sex: Treating RN: 1971/05/15 (52 y.o. Jeffrey Fitzgerald Primary Care Coretta Leisey:  [name] Other Clinician: Referring Yarelie Hams: Treating Astha Probasco/Extender: Selena Lesser in Treatment: 0 Encounter Discharge Information Items Discharge Condition: Stable Ambulatory Status: Wheelchair Discharge Destination: Home Transportation: Private Auto Accompanied By: caregiver Schedule Follow-up Appointment: Yes Clinical Summary of Care: Patient Declined Electronic Signature(s) Signed: 02/10/2023 3:55:24 PM By: Brenton Grills Entered By: Brenton Grills on 02/10/2023 08:55:53 -------------------------------------------------------------------------------- Lower Extremity Assessment Details Patient Name: Date of Service: Jeffrey Fitzgerald, Jeffrey Fitzgerald 02/10/2023 8:00 A M Medical Record Number: [id number] Patient Account Number: [id number] Date of Birth/Sex: Treating RN: 02/14/1971 (52 y.o. Jeffrey Fitzgerald Primary Care Alhaji Mcneal: [name] Other Clinician: Referring Nirvan Laban: Treating Kristion Holifield/Extender: Selena Lesser in Treatment: 0 Electronic Signature(s) Signed: 02/10/2023 3:55:24 PM By: Brenton Grills Entered By: Brenton Grills on 02/10/2023 08:26:42 JAYDENN, MALINA ([id number]) [id number] Page 4 of 7 -------------------------------------------------------------------------------- Multi Wound Chart Details Patient Name: Date of Service: Jeffrey Fitzgerald, Jeffrey Fitzgerald 02/10/2023 8:00 A M Medical Record Number: [id number] Patient Account Number: [id number] Date of Birth/Sex: Treating RN: Jun 10, 1971 (52 y.o. M) Primary Care Kadeja Granada: [name] Other Clinician: Referring Toluwani Ruder: Treating Emmilee Reamer/Extender: Selena Lesser in Treatment: 0 Vital Signs Height(in): Pulse(bpm): 64 Weight(lbs): Blood Pressure(mmHg): 144/80 Body Mass Index(BMI): Temperature(F): 98.6 Respiratory Rate(breaths/min): 18 [1:Photos: No Photos Sacrum Wound Location: Gradually Appeared Wounding Event:  Pressure Ulcer Primary Etiology: Hypertension, Peripheral Arterial Comorbid History: Disease 09/30/2022 Date Acquired: 0 Weeks of Treatment: Healed - Epithelialized Wound Status: No Wound  Recurrence: 0x0x0 Measurements L x W x D (cm) 0 A (cm) : rea 0 Volume (cm) : Suspected Deep Tissue Injury Classification: None Present Exudate A mount: None Present (0%) Granulation A mount: None Present (0%) Necrotic A mount: Fascia: No Exposed  Structures: Fat Layer (Subcutaneous Tissue): No Tendon: No Muscle: No Joint: No Bone: No Large (67-100%) Epithelialization: Scarring: Yes Periwound Skin Texture: No Abnormalities Noted Periwound Skin Moisture: No Abnormalities Noted Periwound Skin Color:  No open wound noted. Assessment Notes:] [N/A:N/A N/A N/A N/A N/A N/A N/A N/A N/A N/A N/A N/A N/A N/A N/A N/A N/A N/A N/A N/A N/A N/A] Treatment Notes Wound #1 (Sacrum) Cleanser  Peri-Wound Care Topical Primary Dressing Secondary Dressing Secured With Compression Wrap Compression Stockings Add-Ons Electronic Signature(s) VEDROS, Corday ([id number]) [id number] Page 5 of 7 Signed: 02/10/2023 9:23:39 AM By: Duanne Guess MD FACS Entered By: Duanne Guess on 02/10/2023 09:23:39 -------------------------------------------------------------------------------- Multi-Disciplinary Care Plan Details Patient Name: Date of Service: Jeffrey Fitzgerald, Jeffrey Fitzgerald 02/10/2023 8:00 A M Medical Record Number: [id number] Patient Account Number: [id number] Date of Birth/Sex: Treating RN: [birth year] (52 y.o. Jeffrey Fitzgerald Primary Care Ornella Coderre: [name] Other Clinician: Referring Jessalyn Hinojosa: Treating Antwoine Zorn/Extender: Selena Lesser in Treatment: 0 Active Inactive Electronic Signature(s) Signed: 02/10/2023 3:55:24 PM By: Brenton Grills Entered By: Brenton Grills on 02/10/2023 09:02:20 -------------------------------------------------------------------------------- Pain  Assessment Details Patient Name: Date of Service: Jeffrey Fitzgerald, Jeffrey Fitzgerald 02/10/2023 8:00 A M Medical Record Number: [id number] Patient Account Number: [id number] Date of Birth/Sex: Treating RN: Feb 05, 1971 (52 y.o. Jeffrey Fitzgerald Primary Care Keijuan Schellhase: [name] Other Clinician: Referring Joniyah Mallinger: Treating Baylea Milburn/Extender: Selena Lesser in Treatment: 0 Active Problems Location of Pain Severity and Description of Pain Patient Has Paino No Site Locations Pain Management and Medication Current Pain Management: Electronic Signature(s) Signed: 02/10/2023 3:55:24 PM By: Abigail Miyamoto, Aristides ([id number]) By: Brenton Grills [phone].pdf Page 6 of 7 Signed: 02/10/2023 3:55:24 PM Entered By: Brenton Grills on 02/10/2023 08:39:24 -------------------------------------------------------------------------------- Patient/Caregiver Education Details Patient Name: Date of Service: Jeffrey Fitzgerald, Jeffrey Fitzgerald 6/13/2024andnbsp8:00 A M Medical Record Number: [id number] Patient Account Number: [id number] Date of Birth/Gender: Treating RN: 1971-05-28 (52 y.o. Jeffrey Fitzgerald Primary Care Physician: [name] Other Clinician: Referring Physician: Treating Physician/Extender: Selena Lesser in Treatment: 0 Education Assessment Education Provided To: Patient and Caregiver Education Topics Provided Wound/Skin Impairment: Methods: Explain/Verbal Responses: State content correctly Electronic Signature(s) Signed: 02/10/2023 3:55:24 PM By: Brenton Grills Entered By: Brenton Grills on 02/10/2023 09:03:53 -------------------------------------------------------------------------------- Wound Assessment Details Patient Name: Date of Service: Jeffrey Fitzgerald, Shahin 02/10/2023 8:00 A M Medical Record Number: [id number] Patient Account Number: [id number] Date of Birth/Sex: Treating RN: 1971/08/05 (52 y.o. Jeffrey Fitzgerald Primary Care Emmaly Leech: [name] Other Clinician: Referring Davey Bergsma: Treating Letoya Stallone/Extender: Selena Lesser in Treatment: 0 Wound Status Wound Number: 1 Primary Etiology: Pressure Ulcer Wound Location: Sacrum Wound Status: Healed - Epithelialized Wounding Event: Gradually Appeared Comorbid History: Hypertension, Peripheral Arterial Disease Date Acquired: 09/30/2022 Weeks Of Treatment: 0 Clustered Wound: No Wound Measurements Length: (cm) Width: (cm) Depth: (cm) Area: (cm) Volume: (cm) 0 % Reduction in Area: 0 % Reduction in Volume: 0 Epithelialization: Large (67-100%) 0 Tunneling: No 0 Undermining: No Wound Description Classification: Suspected Deep Tissue Injury Exudate Amount: None Present Foul Odor After Cleansing: No Slough/Fibrino No Wound Bed Huxtable, Braylen ([id number]) [id number] Page 7 of 7 Granulation Amount: None Present (0%) Exposed Structure Necrotic Amount: None Present (0%) Fascia Exposed: No Fat Layer (Subcutaneous Tissue) Exposed: No Tendon Exposed: No Muscle Exposed: No Joint Exposed: No Bone Exposed: No Periwound Skin Texture Texture Color No Abnormalities Noted: No No Abnormalities Noted: Yes Scarring: Yes Moisture No Abnormalities Noted: Yes Assessment Notes No open wound noted. Treatment Notes Wound #1 (Sacrum) Cleanser Peri-Wound Care Topical Primary Dressing Secondary Dressing Secured With Compression Wrap Compression Stockings Add-Ons Electronic Signature(s) Signed: 02/10/2023 3:55:24 PM By: Brenton Grills Entered By: Brenton Grills on 02/10/2023 09:01:32 -------------------------------------------------------------------------------- Vitals Details Patient Name: Date of Service: Jeffrey Fitzgerald, Amillion 02/10/2023 8:00 A M Medical Record Number: [id number] Patient Account Number: [id number] Date of Birth/Sex: Treating RN: [birth year] (52 y.o. Jeffrey Fitzgerald Primary Care Leesa Leifheit: [name] Other Clinician: Referring  Caz Weaver: Treating Breann Losano/Extender: Selena Lesser in Treatment: 0 Vital Signs Time Taken: 08:19 Temperature (F): 98.6 Pulse (bpm): 64 Respiratory Rate (breaths/min): 18 Blood Pressure (mmHg): 144/80 Reference Range: 80 - 120 mg / dl Electronic Signature(s) Signed: 02/10/2023 3:55:24 PM By: Brenton Grills Entered By: Brenton Grills on 02/10/2023 08:20:26

## 2023-02-15 ENCOUNTER — Other Ambulatory Visit: Payer: Medicaid Other | Admitting: Licensed Clinical Social Worker

## 2023-02-15 NOTE — Patient Outreach (Addendum)
  Medicaid Managed Care   Unsuccessful Attempt Note   02/15/2023 Name: Jeffrey Fitzgerald MRN: [id number] DOB: Apr 17, 1971  Referred by: [name], DO Reason for referral : No chief complaint on file.   LCSW completed [facility] outreach attempt today during scheduled appointment time but was unable to reach patient's niece successfully. [facility] LCSW was unable to leave a voice message. LCSW will ask Scheduling Care Guide to reschedule [facility] SW appointment with patient.  Follow Up Plan- Voice mailbox was full. The Managed Medicaid care management team will reach out to the patient again over the next 30 days.   [name], BSW, MSW, [name] Managed Medicaid LCSW [facility]  [name] [email or web address] Phone: [phone]

## 2023-02-15 NOTE — Patient Instructions (Signed)
Jeffrey Fitzgerald ,   The [facility] Managed Care Team is available to provide assistance to you with your healthcare needs at no cost and as a benefit of your [facility] plan. I'm sorry I was unable to reach you today for our scheduled appointment. Our care guide will call you to reschedule our telephone appointment. Please call me at the number below. I am available to be of assistance to you regarding your healthcare needs. .   Thank you,   [name], BSW, MSW, LCSW Managed Medicaid LCSW [facility]  [address] [email or web address] Phone: [phone]

## 2023-02-18 ENCOUNTER — Other Ambulatory Visit: Payer: Self-pay | Admitting: Student

## 2023-02-18 DIAGNOSIS — I10 Essential (primary) hypertension: Secondary | ICD-10-CM

## 2023-02-21 ENCOUNTER — Other Ambulatory Visit: Payer: Self-pay | Admitting: Student

## 2023-03-07 ENCOUNTER — Other Ambulatory Visit: Payer: Medicaid Other | Admitting: Licensed Clinical Social Worker

## 2023-03-07 NOTE — Patient Outreach (Addendum)
Medicaid Managed Care Social Work Note  03/07/2023 Name:  Jeffrey Fitzgerald MRN:  [id number] DOB:  06-Sep-1970  Jeffrey Fitzgerald is an 52 y.o. year old male who is a primary patient of Jeffrey Morel, DO.  The Medicaid Managed Care Coordination team was consulted for assistance with:  Mental Health Counseling and Resources  Jeffrey Fitzgerald was given information about Medicaid Managed Care Coordination team services today. Jeffrey Fitzgerald Patient, Parent, and Caregiver agreed to services and verbal consent obtained.  Engaged with patient  for by telephone forfollow up visit in response to referral for case management and/or care coordination services.   Assessments/Interventions:  Review of past medical history, allergies, medications, health status, including review of consultants reports, laboratory and other test data, was performed as part of comprehensive evaluation and provision of chronic care management services.  SDOH: (Social Determinant of Health) assessments and interventions performed: SDOH Interventions    Flowsheet Row Patient Outreach Telephone from 03/07/2023 in [place] POPULATION HEALTH DEPARTMENT Patient Outreach Telephone from 02/04/2023 in [place] POPULATION HEALTH DEPARTMENT Patient Outreach Telephone from 01/27/2023 in [place] POPULATION HEALTH DEPARTMENT Patient Outreach Telephone from 01/06/2023 in [place] POPULATION HEALTH DEPARTMENT Office Visit from 09/22/2022 in [facility]  SDOH Interventions       Food Insecurity Interventions -- -- -- -- Intervention Not Indicated  Housing Interventions -- -- -- Intervention Not Indicated Intervention Not Indicated  Transportation Interventions -- -- -- -- Intervention Not Indicated  Utilities Interventions -- -- -- -- Intervention Not Indicated  Financial Strain Interventions -- -- -- -- Intervention Not Indicated  Physical Activity Interventions Other (Comments)  [PCP message sent for PT request  from family] -- -- -- --  Stress Interventions Provide Counseling, Other (Comment)  [SNF placement education] [employer], Provide Counseling Offered [employer] Resources, Provide Counseling  [per mother] Offered [employer], Provide Counseling --       Advanced Directives Status:  See Care Plan for related entries.  Care Plan                 No Known Allergies  Medications Reviewed Today     Reviewed by Jeffrey Bene, MD (Resident) on 09/24/22 at 1526  Med List Status: <None>   Medication Order Taking? Sig Documenting Provider Last Dose Status Informant  acetaminophen (TYLENOL) 500 MG tablet [id number]  Take 1 tablet (500 mg total) by mouth every 6 (six) hours as needed for moderate pain. Jeffrey Morel, DO  Active   amLODipine (NORVASC) 10 MG tablet [id number]  Take 1 tablet (10 mg total) by mouth daily. Jeffrey Morel, DO  Active   aspirin EC 81 MG tablet [id number]  Take 1 tablet (81 mg total) by mouth daily. Swallow whole. Jeffrey Morel, DO  Active   atorvastatin (LIPITOR) 80 MG tablet [id number]  Take 1 tablet (80 mg total) by mouth daily. Jeffrey Morel, DO  Active   carvedilol (COREG) 12.5 MG tablet [id number]  TAKE 1 TABLET (12.5 MG TOTAL) BY MOUTH TWO (TWO) TIMES DAILY WITH A MEAL. Jeffrey Morel, DO  Active   docusate sodium (COLACE) 100 MG capsule [id number]  Take 1 capsule (100 mg total) by mouth daily. Jeffrey Morel, DO  Active   hydrALAZINE (APRESOLINE) 25 MG tablet [id number]  Take 1 tablet (25 mg total) by mouth 2 (two) times daily. Jeffrey Morel, DO  Active   Nystatin (GERHARDT'S BUTT CREAM) CREA [id number]  Apply 1 Application topically as needed for irritation (apply to periarea  as needed for excoriation). Jeffrey Morel, DO  Active   polyethylene glycol [facility] / GLYCOLAX) packet 17 g [id number]   Jeffrey Morel, DO  Active   sertraline (ZOLOFT) 25 MG tablet [id number]  Take 1 tablet (25 mg total) by mouth daily.  Jeffrey Morel, DO  Active             Patient Active Problem List   Diagnosis Date Noted   Pressure injury of sacral region, stage 1 01/22/2023   Scrotal erythema 01/22/2023   Physical deconditioning 01/12/2023   Abdominal hernia 09/24/2022   S/P aortobifemoral bypass surgery 06/29/2022   Depression 06/09/2022   Constipation 06/09/2022   S/P AKA (above knee amputation) bilateral (HCC) 06/09/2022   Malnutrition of moderate degree 05/29/2022   Muscle weakness of left upper extremity 05/27/2022   Tobacco dependence 05/27/2022   Adjustment reaction 05/27/2022   DNR (do not resuscitate) 05/27/2022   Acute postoperative anemia due to expected blood loss    Gastroesophageal reflux disease    Pressure injury of skin 04/13/2022   PAD (peripheral artery disease) (HCC) 04/07/2022   Critical limb ischemia of both lower extremities (HCC) 04/01/2022   Iliac artery occlusion, 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   Hypertension 01/25/2020    Conditions to be addressed/monitored per PCP order:  Depression  Care Plan : LCSW Plan of Care  Updates made by Jeffrey Bryant, LCSW since 03/07/2023 12:00 AM     Problem: Quality of Life (General Plan of Care)      Goal: Quality of Life Maintained   Start Date: 01/06/2023  Note:   Priority: High  Timeframe:  Long-Range Goal Priority:  High Start Date:   01/06/23           Expected End Date:  ongoing                     Follow Up Date--03/21/23 at 9am  Current barriers:   Level of care concerns and need for SNF placement Physical and mental health concerns  Need for FL2 Need for Financial Assistance and connection to available community resources Need for education on SNF placement process and education on facilities nearby in order to chose a facility they would like placement  Loss of recent caregiver support due to niece becoming ill (mother is now primary caregiver other than part time CNA that comes in)  Clinical Goals: Patient's mother we work  with agencies/resources/PCP/Internal Medicine LCSW, [facility] LCSW to address needs related to gaining SNF placement and gaining desired physical therapy treatment at a safe level of care  Clinical Interventions:  Inter-disciplinary care team collaboration (see longitudinal plan of care) Parent provided patient history Assessment of needs, progress, barriers , and agencies contacted  Assessed need for SNF placement  Advised patient's caregivers to answer all calls from care providers and to keep phone nearby. Advised parents to contact 911 or [phone] if a crisis occurs.  Clinical interventions provided:Solution-Focused Strategies, Active listening / Reflection utilized , Problem Solving [name] ,  Past update-Two emails sent to patient's family with several documents regarding the ALF placement process, a complete list of ALF's in the area, and Medicaid transfer educational materials. 01/12/23- [facility] LCSW completed call to mother today and was informed that patient completed PCP visit yesterday and a HH referral was made but that she received a call today regarding this referral stating that since patient has no prosthesis, he is not eligible for physical therapy. I first spoke with patient's family on 01/06/23  and family seemed on board with long term ALF placement but after disclosing today that the state would take over patient's income once Medicaid transitions from community Medicaid to nursing facility Medicaid, family report that they just wish for patient to be able to get stronger and be able to live in the home independently. Patient's previous primary caregiver [name], his niece is unable to provide as much case now so family is limited in support which was reminded to family. Family does have PCS involvement and a regular aide who they really like. [facility] LCSW will route encounter to PCP and PCP's LCSW and follow up next week. 01/20/23 update- Patient's mother reports that [facility] has started and wound care has been  ordered by PCP. Mother is requesting assistance with SNF placement. [facility] LCSW provided education on requirements for short term placement. [facility] LCSW advised patient will need to be recommended for SNF placement but PT (this SNF recommendation has recently been documented in patient's chart) and a FL2 document signed by a physician. [facility] LCSW had mother physically write down some reminders: all apfuture appointments and to utilize [facility] benefits program and call the number on the back of patient's insurance card. [facility] LCSW completed care coordination with PCP through secure chat. Update 01/27/23- Patient's mother reports that she still has not received email with resources that [facility] LCSW sent 3 different times. Patient's mother contacted [name] and received correct email and [facility] LCSW resent resources out to this correct email [email or web address] on 01/27/23. [facility] LCSW will follow up in in 2 weeks. Update- Patient's mother reports that they have decided to go with [facility] but that patient's legal guardian (niece) will have to go and sign medical record forms in order to release information. However, niece is having a procedure down tomorrow and is experiencing health issues herself and will not be able to do this until next week. [facility] LCSW provided education on the SNF placement process to mother. Family is agreeable to [facility] LCSW making next follow up call with patient's Niece instead of patient's Mother. [facility] spoke with patient and successfully gained consent to discuss his health care with both his mother Draden Rumley and Caregiver Aide Beatriz Chancellor. Aide provided patient's healthcare updates. Family has been in communication with [facility] and was informed that he will need PT in the home before being eligible for SNF placement. [facility] LCSW will update PCP. [facility] LCSW scheduled family with [facility] RNCM for an initial appointment/assessment per family's wishes. Family has a caseworker involved named Jacki Cones  from [facility] as well. Family reports that patient is suffering from isolation and wishes for socialization resources. [facility] LCSW sent email to family with community resource information.    Patient Goals/Self-Care Activities: Over the next 30 days Complete review of emailed resources  Call Social Services to discuss resources and support from [facility] caseworker Make a PCP appointment for [facility] completion      24- Hour Availability:    [facility]  [address] [place], [place] [phone] Crisis [phone]   [facility] the [facility] [phone]  [place] [facility]  [phone]    [facility]  [phone] (after hours)   Therapeutic Alternative/Mobile Crisis   [phone]   [place] National Suicide Hotline  978-135-2152 Len Childs) [place] 884   Call [phone] for mental health emergencies   [facility]  [phone]);  [place] and [employer]  [phone]); [place], [place], [place], [place],  [place], Carmelina Dane    [employer] [facility] Urgent Care for [facility] Residents For 24/7 walk-up access to mental health services for [facility] children (4+), adolescents and adults, please visit the [facility] located at [address] in [place], [place].  *[place] also provides comprehensive outpatient behavioral health services in a variety of locations around the [place].  Connect With [place] [address] [place], [place] [address] HelpLine: (705) 705-9147 or 1-508-291-1117  Get Directions  Find Help 24/7 By Phone Call our 24-hour HelpLine at 912-510-8855 or 301-780-8137 for immediate assistance for mental health and substance abuse issues.  Walk-In Help [place]: [facility] (Ages 4 and Up) [place]: Emergency Dept., [facility] Additional Resources National  Hopeline Network: 1-800-SUICIDE The National Suicide Prevention Lifeline: 1-800-273-TALK         Follow up:  Patient agrees to Care Plan and Follow-up.  Plan: The Managed Medicaid care management team will reach out to the patient again over the next 30 days.  [name], BSW, MSW, [name] Managed Medicaid LCSW [facility]  [name] Phone: [phone]

## 2023-03-07 NOTE — Patient Instructions (Signed)
Visit Information  Jeffrey Fitzgerald was given information about Medicaid Managed Care team care coordination services as a part of their [facility] Community Plan Medicaid benefit. Alicia Whitehorn verbally consented to engagement with the [facility] Managed Care team.   If you are experiencing a medical emergency, please call 911 or report to your local emergency department or urgent care.   If you have a non-emergency medical problem during routine business hours, please contact your provider's office and ask to speak with a nurse.   For questions related to your [facility], please call: [phone] or visit the homepage here: [email or web address]  If you would like to schedule transportation through your [facility], please call the following number at least 2 days in advance of your appointment: [phone]   Rides for urgent appointments can also be made after hours by calling Member Services.  Call the Behavioral Health Crisis Line at 6820300086, at any time, 24 hours a day, 7 days a week. If you are in danger or need immediate medical attention call 911.  If you would like help to quit smoking, call 1-800-QUIT-NOW (917-131-0519) OR Espaol: 1-855-Djelo-Ya (1-324-401-0272) o para ms informacin haga clic aqu or Text READY to 536-644 to register via text  Following is a copy of your plan of care:  Care Plan : LCSW Plan of Care  Updates made by [name], LCSW since 03/07/2023 12:00 AM     Problem: Quality of Life (General Plan of Care)      Goal: Quality of Life Maintained   Start Date: 01/06/2023  Note:   Priority: High  Timeframe:  Long-Range Goal Priority:  High Start Date:   01/06/23           Expected End Date:  ongoing                     Follow Up Date--03/21/23 at 9am  Current barriers:   Level of care concerns and need for SNF placement Physical and  mental health concerns  Need for FL2 Need for Financial Assistance and connection to available community resources Need for education on SNF placement process and education on facilities nearby in order to chose a facility they would like placement  Loss of recent caregiver support due to niece becoming ill (mother is now primary caregiver other than part time CNA that comes in)  Clinical Goals: Patient's mother we work with agencies/resources/PCP/Internal Medicine LCSW, [facility] LCSW to address needs related to gaining SNF placement and gaining desired physical therapy treatment at a safe level of care   Patient Goals/Self-Care Activities: Over the next 30 days Complete review of emailed resources  Call Social Services to discuss resources and support from [facility] caseworker Make a PCP appointment for [facility] completion      24- Hour Availability:    [facility]  [address] [place], [place] [phone] Crisis [phone]   [facility] [phone]  [place] [facility]  [phone]    [facility]  [phone] (after hours)   Therapeutic Alternative/Mobile Crisis   [phone]   [place] National Suicide Hotline  210-072-3060 Len Childs) [place] [address]   Call (909)200-1423 for mental health emergencies   [facility]  [phone]);  [place] and [employer]  [phone]); [place], [place], [place], [place], [place], [place], [place]    [place] [facility] Urgent Care for [facility]  For 24/7 walk-up access to mental health services for [facility] children (4+), adolescents and adults, please visit the [facility] located at [address] in [place], [place].  *[place] also provides comprehensive outpatient behavioral health services in a variety of locations around the [place].  Connect With [place] [address] [place], [place] [address] HelpLine: (651)173-8203 or 1-734 101 8363  Get Directions  Find Help 24/7 By Phone Call our 24-hour HelpLine at 575 511 9575 or 854-608-2019 for immediate assistance for mental health and substance abuse issues.  Walk-In Help [place]: [facility] (Ages 4 and Up) [place]: Emergency Dept., [facility] Additional Resources National Hopeline Network: 1-800-SUICIDE The National Suicide Prevention Lifeline: (715)835-5618

## 2023-03-14 ENCOUNTER — Encounter: Payer: Self-pay | Admitting: Student

## 2023-03-14 ENCOUNTER — Ambulatory Visit (INDEPENDENT_AMBULATORY_CARE_PROVIDER_SITE_OTHER): Payer: Medicaid Other | Admitting: Student

## 2023-03-14 VITALS — BP 128/68 | HR 54 | Temp 98.2°F

## 2023-03-14 DIAGNOSIS — Z89612 Acquired absence of left leg above knee: Secondary | ICD-10-CM

## 2023-03-14 DIAGNOSIS — I739 Peripheral vascular disease, unspecified: Secondary | ICD-10-CM | POA: Diagnosis present

## 2023-03-14 DIAGNOSIS — Z89611 Acquired absence of right leg above knee: Secondary | ICD-10-CM | POA: Diagnosis not present

## 2023-03-14 DIAGNOSIS — F17210 Nicotine dependence, cigarettes, uncomplicated: Secondary | ICD-10-CM | POA: Diagnosis not present

## 2023-03-14 DIAGNOSIS — R5381 Other malaise: Secondary | ICD-10-CM

## 2023-03-14 DIAGNOSIS — L89151 Pressure ulcer of sacral region, stage 1: Secondary | ICD-10-CM | POA: Diagnosis not present

## 2023-03-14 DIAGNOSIS — D649 Anemia, unspecified: Secondary | ICD-10-CM

## 2023-03-14 DIAGNOSIS — E782 Mixed hyperlipidemia: Secondary | ICD-10-CM

## 2023-03-14 DIAGNOSIS — E785 Hyperlipidemia, unspecified: Secondary | ICD-10-CM | POA: Insufficient documentation

## 2023-03-14 DIAGNOSIS — G546 Phantom limb syndrome with pain: Secondary | ICD-10-CM

## 2023-03-14 DIAGNOSIS — F17200 Nicotine dependence, unspecified, uncomplicated: Secondary | ICD-10-CM

## 2023-03-14 DIAGNOSIS — Z8639 Personal history of other endocrine, nutritional and metabolic disease: Secondary | ICD-10-CM | POA: Insufficient documentation

## 2023-03-14 DIAGNOSIS — Z0000 Encounter for general adult medical examination without abnormal findings: Secondary | ICD-10-CM | POA: Insufficient documentation

## 2023-03-14 MED ORDER — DULOXETINE HCL 30 MG PO CPEP
ORAL_CAPSULE | ORAL | 2 refills | Status: DC
Start: 2023-03-14 — End: 2023-03-21

## 2023-03-14 MED ORDER — ATORVASTATIN CALCIUM 80 MG PO TABS
80.0000 mg | ORAL_TABLET | Freq: Every day | ORAL | 11 refills | Status: DC
Start: 2023-03-14 — End: 2023-03-21

## 2023-03-14 MED ORDER — DULOXETINE HCL 30 MG PO CPEP: ORAL_CAPSULE | ORAL | 2 refills | Status: DC

## 2023-03-14 NOTE — Progress Notes (Signed)
Subjective:  CC: Routine physical in the setting of bilateral above-the-knee amputation.  HPI:  [name] is a 52 y.o. male with a past medical history stated below and presents today for routine physical. Please see problem based assessment and plan for additional details.  Past Medical History:  Diagnosis Date   Hypertension    Peripheral vascular disease (HCC)    Tobacco abuse     Current Outpatient Medications on File Prior to Visit  Medication Sig Dispense Refill   amLODipine (NORVASC) 5 MG tablet Take 1 tablet (5 mg total) by mouth daily. 30 tablet 11   carvedilol (COREG) 12.5 MG tablet TAKE ONE TABLET BY MOUTH TWICE A DAY WITH MEALS 60 tablet 11   Nystatin (GERHARDT'S BUTT CREAM) CREA Apply 1 Application topically as needed for irritation (apply to periarea as needed for excoriation). 1 each 0   Acetaminophen Extra Strength 500 MG TABS TAKE ONE TABLET BY MOUTH EVERY SIX HOURS AS NEEDED FOR MODERATE PAIN (Patient not taking: Reported on 03/14/2023) 30 tablet 2   aspirin EC 81 MG tablet Take 1 tablet (81 mg total) by mouth daily. Swallow whole. (Patient not taking: Reported on 03/14/2023) 30 tablet 12   atorvastatin (LIPITOR) 80 MG tablet TAKE ONE TABLET BY MOUTH AT BEDTIME (Patient not taking: Reported on 03/14/2023) 30 tablet 0   docusate sodium (COLACE) 100 MG capsule TAKE 1 CAPSULE (100 MG TOTAL) BY MOUTH DAILY (BOTTLE) 30 capsule 0   No current facility-administered medications on file prior to visit.    Family History  Problem Relation Age of Onset   Hypertension Father    Heart disease Father        No details.  Died in his earlies 17s and might have had heart attacks or coronary disease prior to that.   Cancer Paternal Uncle    Vascular Disease Paternal Uncle     Social History   Socioeconomic History   Marital status: Single    Spouse name: Not on file   Number of children: Not on file   Years of education: Not on file   Highest education level:  Not on file  Occupational History   Not on file  Tobacco Use   Smoking status: Every Day    Current packs/day: 0.50    Average packs/day: 0.5 packs/day for 30.0 years (15.0 ttl pk-yrs)    Types: Cigarettes    Passive exposure: Current   Smokeless tobacco: Never   Tobacco comments:    0.5 PPD  Vaping Use   Vaping status: Never Used  Substance and Sexual Activity   Alcohol use: Not Currently    Comment: 04/06/22- 1 LIter a week; none since last hospitalization   Drug use: Not Currently    Types: Marijuana   Sexual activity: Not on file  Other Topics Concern   Not on file  Social History Narrative   He is currently living with his mom.  He still smokes cigarettes.  Used to work in a [employer] .   Social Determinants of Health   Financial Resource Strain: Low Risk  (09/23/2022)   Overall Financial Resource Strain (CARDIA)    Difficulty of Paying Living Expenses: Not hard at all  Food Insecurity: No Food Insecurity (09/23/2022)   Hunger Vital Sign    Worried About Running Out of Food in the Last Year: Never true    Ran Out of Food in the Last Year: Never true  Transportation Needs: No Transportation Needs (09/23/2022)  PRAPARE - Administrator, Civil Service (Medical): No    Lack of Transportation (Non-Medical): No  Physical Activity: Inactive (03/07/2023)   Exercise Vital Sign    Days of Exercise per Week: 0 days    Minutes of Exercise per Session: 0 min  Stress: No Stress Concern Present (03/07/2023)   [employer] of Occupational Health - Occupational Stress Questionnaire    Feeling of Stress : Only a little  Recent Concern: Stress - Stress Concern Present (02/04/2023)   [employer] of Occupational Health - Occupational Stress Questionnaire    Feeling of Stress : To some extent  Social Connections: Socially Isolated (09/23/2022)   Social Connection and Isolation Panel [NHANES]    Frequency of Communication with Friends and Family: More than three times a  week    Frequency of Social Gatherings with Friends and Family: More than three times a week    Attends Religious Services: Never    Database administrator or Organizations: No    Attends Banker Meetings: Never    Marital Status: Separated  Intimate Partner Violence: Not At Risk (09/23/2022)   Humiliation, Afraid, Rape, and Kick questionnaire    Fear of Current or Ex-Partner: No    Emotionally Abused: No    Physically Abused: No    Sexually Abused: No    Review of Systems: ROS negative except for what is noted on the assessment and plan.  Objective:   Vitals:   03/14/23 0858  BP: 128/68  Pulse: (!) 54  Temp: 98.2 F (36.8 C)  TempSrc: Oral  SpO2: 99%    Physical Exam: Constitutional: well-appearing in no acute distress HENT: normocephalic atraumatic, mucous membranes moist Eyes: conjunctiva non-erythematous Neck: supple Cardiovascular: regular rate and rhythm, no m/r/g Pulmonary/Chest: normal work of breathing on room air, lungs clear to auscultation bilaterally Abdominal: soft, non-tender, non-distended MSK: normal bulk and tone, bilateral above-the-knee amputation.  The right femur is in flexion, and the patient has pain when attempting to extend the hip. Skin: warm and dry Psych: Normal mood and affect.   Assessment & Plan:  PAD (peripheral artery disease) (HCC) Patient has a history of critical limb ischemia requiring multiple operative interventions.  Ultimately required above-the-knee amputation bilaterally.  Since then has had issues with deconditioning and isolation mobility and sacral wounds.   The patient denies any pain or claudication of the upper extremities.  He is still smoking and says he is not interested in quitting at this time.  We discussed the importance of smoking cessation, and counseled the patient to return to me if he was interested in pursuing medical treatment for smoking.  Patient has not been taking statin, aspirin.  We  reviewed his medications today and instructed him to resume taking these medications.  Plan: Lipid panel Resume aspirin, Lipitor 80mg   S/P AKA (above knee amputation) bilateral (HCC) Patient is status post above-the-knee amputation bilaterally.  Patient endorses pain both in his operative stump, and his phantom limbs.  Patient's caregiver says that he is frequently found in the morning with a flexion contracture of the right femur.  It is difficult to him to relax his right femur, and he has significant pain during this time.  He has been taking Tylenol for the pain, there is concern regarding amount of Tylenol he is taking.  We instructed the patient to take no more than 3500 mg a day.  They are attempting to get the patient placed in SNF for rehabilitation purposes.  I think this would likely be appropriate given the deconditioning the patient has.  They are unable to do their activities of daily living by themselves.  They have a nurse who is with him for 2 hours a day, however once the nurse leaves they are unable to ambulate.  He is frequently found in his own feces and urine the next day.   Pressure injury of sacral region, stage 1 Patient recently seen by wound care specialist for his sacral wound, they are pleased with how his wounds are healing, and have recommended that they continue treatment.   Physical deconditioning Patient has suffered from physical deconditioning following his surgery, he is unable to do his ADLs due to little strength.  He is unable to ambulate or dress himself.  He relies on nursing for much of his needs, however he only received 2 hours of nursing care per day.  They have tried to get the patient to physical therapy in the past, however physical therapist have refused to work with the patient until he has prosthesis.  We but the patient would likely benefit from additional therapy in order to regain his strength and become more  independent.  Hyperlipidemia Patient with history of PAD, hyperlipidemia on last lab.  Has not been taking his Lipitor due to some confusion regarding drug prescription.  Counseled the patient on resuming his statin, and will obtain lipid panel today.  History of elevated glucose Patient with multiple elevated glucose readings on prior BMPs.  Would like to pursue HbA1c at today's visit  Healthcare maintenance Patient has no hep C screening on file, is due for colonoscopy.  Will obtain hep C screening, refer to GI for colonoscopy today.  Anemia Patient has a history of anemia, and has not had a CBC to follow-up on this since 2023.  Anemia is likely due to blood loss from his procedure, however it is difficult to know if this is a chronic issue with multiple etiologies or simply due to his procedure in the past. Plan: Obtain CBC today to check in on anemia.  Phantom limb pain (HCC) 10/10 pain in the legs, especially in the mornings when he wakes up. He says it feels like " a leg is growing out his legs". Denies electric pain. Says the pain is different than the pain at his surgical stump, but rather in his phantom limb.  We discussed with the patient that phantom limb pain can sometimes be difficult to treat and may require multimodal treatment.  The patient was an SSRI, but were not taking it due to confusions regarding the prescription.  Due to this patient's phantom limb pain, we feel that they would be a good candidate to be switched to Cymbalta. Plan: Begin Cymbalta 30 mg for 1 week, increased to 60 mg after 1 week.   Patient seen with Dr. Cleda Daub  Lovie Macadamia MD [facility] Internal Medicine  PGY-1 Pager: [phone]  Phone: [phone] Date 03/14/2023  Time 11:11 AM

## 2023-03-14 NOTE — Assessment & Plan Note (Signed)
Patient has a history of critical limb ischemia requiring multiple operative interventions.  Ultimately required above-the-knee amputation bilaterally.  Since then has had issues with deconditioning and isolation mobility and sacral wounds.   The patient denies any pain or claudication of the upper extremities.  He is still smoking and says he is not interested in quitting at this time.  We discussed the importance of smoking cessation, and counseled the patient to return to me if he was interested in pursuing medical treatment for smoking.  Patient has not been taking statin, aspirin.  We reviewed his medications today and instructed him to resume taking these medications.  Plan: Lipid panel Resume aspirin, Lipitor 80mg 

## 2023-03-14 NOTE — Assessment & Plan Note (Signed)
10/10 pain in the legs, especially in the mornings when he wakes up. He says it feels like " a leg is growing out his legs". Denies electric pain. Says the pain is different than the pain at his surgical stump, but rather in his phantom limb.  We discussed with the patient that phantom limb pain can sometimes be difficult to treat and may require multimodal treatment.  The patient was an SSRI, but were not taking it due to confusions regarding the prescription.  Due to this patient's phantom limb pain, we feel that they would be a good candidate to be switched to Cymbalta. Plan: Begin Cymbalta 30 mg for 1 week, increased to 60 mg after 1 week.

## 2023-03-14 NOTE — Assessment & Plan Note (Signed)
Patient is status post above-the-knee amputation bilaterally.  Patient endorses pain both in his operative stump, and his phantom limbs.  Patient's caregiver says that he is frequently found in the morning with a flexion contracture of the right femur.  It is difficult to him to relax his right femur, and he has significant pain during this time.  He has been taking Tylenol for the pain, there is concern regarding amount of Tylenol he is taking.  We instructed the patient to take no more than 3500 mg a day.  They are attempting to get the patient placed in SNF for rehabilitation purposes.  I think this would likely be appropriate given the deconditioning the patient has.  They are unable to do their activities of daily living by themselves.  They have a nurse who is with him for 2 hours a day, however once the nurse leaves they are unable to ambulate.  He is frequently found in his own feces and urine the next day.

## 2023-03-14 NOTE — Assessment & Plan Note (Signed)
Patient has suffered from physical deconditioning following his surgery, he is unable to do his ADLs due to little strength.  He is unable to ambulate or dress himself.  He relies on nursing for much of his needs, however he only received 2 hours of nursing care per day.  They have tried to get the patient to physical therapy in the past, however physical therapist have refused to work with the patient until he has prosthesis.  We but the patient would likely benefit from additional therapy in order to regain his strength and become more independent.

## 2023-03-14 NOTE — Assessment & Plan Note (Signed)
Patient has no hep C screening on file, is due for colonoscopy.  Will obtain hep C screening, refer to GI for colonoscopy today.

## 2023-03-14 NOTE — Patient Instructions (Addendum)
Thank you, Mr.Jaylynn Baugh for allowing [place] to provide your care today. Today we discussed pain, routine health screening.    I have ordered the following labs for you:  Lab Orders         Hepatitis C Ab reflex to Quant PCR         Hemoglobin A1c         Lipid Profile         CBC no Diff       Referrals ordered today:   Referral Orders         Ambulatory referral to Gastroenterology       I have ordered the following medication/changed the following medications:   Stop the following medications: Medications Discontinued During This Encounter  Medication Reason   polyethylene glycol (MIRALAX / GLYCOLAX) packet 17 g    sertraline (ZOLOFT) 25 MG tablet    atorvastatin (LIPITOR) 80 MG tablet Reorder   DULoxetine (CYMBALTA) 30 MG capsule      Start the following medications: Meds ordered this encounter  Medications   DISCONTD: DULoxetine (CYMBALTA) 30 MG capsule    Sig: Take 1 capsule (30 mg total) by mouth daily for 7 days, THEN 2 capsules (60 mg total) daily.    Dispense:  30 capsule    Refill:  2   DULoxetine (CYMBALTA) 30 MG capsule    Sig: Take 1 capsule (30 mg total) by mouth daily for 7 days, THEN 2 capsules (60 mg total) daily.    Dispense:  30 capsule    Refill:  2   atorvastatin (LIPITOR) 80 MG tablet    Sig: Take 1 tablet (80 mg total) by mouth at bedtime.    Dispense:  30 tablet    Refill:  11     Follow up: 2-3 months   We look forward to seeing you next time. Please call our clinic at [phone] if you have any questions or concerns. The best time to call is Monday-Friday from 9am-4pm, but there is someone available 24/7. If after hours or the weekend, call the main hospital number and ask for the Internal Medicine Resident On-Call. If you need medication refills, please notify your pharmacy one week in advance and they will send [place] a request.   Thank you for trusting me with your care. Wishing you the best!  Lovie Macadamia MD [facility]

## 2023-03-14 NOTE — Assessment & Plan Note (Signed)
Patient with multiple elevated glucose readings on prior BMPs.  Would like to pursue HbA1c at today's visit

## 2023-03-14 NOTE — Assessment & Plan Note (Signed)
Patient has a history of anemia, and has not had a CBC to follow-up on this since 2023.  Anemia is likely due to blood loss from his procedure, however it is difficult to know if this is a chronic issue with multiple etiologies or simply due to his procedure in the past. Plan: Obtain CBC today to check in on anemia.

## 2023-03-14 NOTE — Assessment & Plan Note (Signed)
Patient recently seen by wound care specialist for his sacral wound, they are pleased with how his wounds are healing, and have recommended that they continue treatment.

## 2023-03-14 NOTE — Assessment & Plan Note (Signed)
Patient with history of PAD, hyperlipidemia on last lab.  Has not been taking his Lipitor due to some confusion regarding drug prescription.  Counseled the patient on resuming his statin, and will obtain lipid panel today.

## 2023-03-15 LAB — LIPID PANEL
Chol/HDL Ratio: 5.4 ratio — ABNORMAL HIGH (ref 0.0–5.0)
Cholesterol, Total: 185 mg/dL (ref 100–199)
HDL: 34 mg/dL — ABNORMAL LOW (ref >39)
LDL Chol Calc (NIH): 123 mg/dL — ABNORMAL HIGH (ref 0–99)
Triglycerides: 156 mg/dL — ABNORMAL HIGH (ref 0–149)
VLDL Cholesterol Cal: 28 mg/dL (ref 5–40)

## 2023-03-15 LAB — CBC
Hematocrit: 37.2 % — ABNORMAL LOW (ref 37.5–51.0)
Hemoglobin: 12.3 g/dL — ABNORMAL LOW (ref 13.0–17.7)
MCH: 28.3 pg (ref 26.6–33.0)
MCHC: 33.1 g/dL (ref 31.5–35.7)
MCV: 86 fL (ref 79–97)
Platelets: 179 10*3/uL (ref 150–450)
RBC: 4.34 x10E6/uL (ref 4.14–5.80)
RDW: 15.1 % (ref 11.6–15.4)
WBC: 4.5 10*3/uL (ref 3.4–10.8)

## 2023-03-15 LAB — HCV INTERPRETATION

## 2023-03-15 LAB — HCV AB W REFLEX TO QUANT PCR: HCV Ab: NONREACTIVE

## 2023-03-15 LAB — HEMOGLOBIN A1C
Est. average glucose Bld gHb Est-mCnc: 117 mg/dL
Hgb A1c MFr Bld: 5.7 % — ABNORMAL HIGH (ref 4.8–5.6)

## 2023-03-15 NOTE — Progress Notes (Signed)
 Internal Medicine Clinic Attending  I saw and evaluated the patient.  I personally confirmed the key portions of the history and exam documented by the resident  and I reviewed pertinent patient test results.  The assessment, diagnosis, and plan were formulated together and I agree with the documentation in the resident's note.  

## 2023-03-17 ENCOUNTER — Other Ambulatory Visit: Payer: Medicaid Other | Admitting: *Deleted

## 2023-03-17 NOTE — Patient Instructions (Signed)
Visit Information  [name]  - as a part of your Medicaid benefit, you are eligible for care management and care coordination services at no cost or copay. I was unable to reach you by phone today but would be happy to help you with your health related needs. Please feel free to call me @ [phone].   A member of the Managed Medicaid care management team will reach out to you again over the next 7 days.   [name] RN, BSN [place]  Managed [facility] RN Care Coordinator [phone]

## 2023-03-17 NOTE — Patient Outreach (Signed)
  Medicaid Managed Care   Unsuccessful Attempt Note   03/17/2023 Name: Jeffrey Fitzgerald MRN: [id number] DOB: 1970-10-15  Referred by: [name], DO Reason for referral : High Risk Managed Medicaid (Unsuccessful RNCM initial telephone outreach)   An unsuccessful telephone outreach was attempted today. The patient was referred to the case management team for assistance with care management and care coordination.    Follow Up Plan: The Managed Medicaid care management team will reach out to the patient again over the next 7 days.    [name] RN, BSN [place]  Managed [facility] RN Care Coordinator [phone]

## 2023-03-18 ENCOUNTER — Ambulatory Visit: Payer: Medicaid Other | Admitting: *Deleted

## 2023-03-21 ENCOUNTER — Other Ambulatory Visit: Payer: Medicaid Other | Admitting: Licensed Clinical Social Worker

## 2023-03-21 ENCOUNTER — Other Ambulatory Visit: Payer: Self-pay

## 2023-03-21 DIAGNOSIS — G546 Phantom limb syndrome with pain: Secondary | ICD-10-CM

## 2023-03-21 DIAGNOSIS — K5900 Constipation, unspecified: Secondary | ICD-10-CM

## 2023-03-21 DIAGNOSIS — I10 Essential (primary) hypertension: Secondary | ICD-10-CM

## 2023-03-21 DIAGNOSIS — L89309 Pressure ulcer of unspecified buttock, unspecified stage: Secondary | ICD-10-CM

## 2023-03-21 DIAGNOSIS — I739 Peripheral vascular disease, unspecified: Secondary | ICD-10-CM

## 2023-03-21 MED ORDER — ATORVASTATIN CALCIUM 80 MG PO TABS
80.0000 mg | ORAL_TABLET | Freq: Every day | ORAL | 11 refills | Status: AC
Start: 2023-03-21 — End: ?

## 2023-03-21 MED ORDER — ASPIRIN 81 MG PO TBEC
81.0000 mg | DELAYED_RELEASE_TABLET | Freq: Every day | ORAL | Status: DC
Start: 2023-03-21 — End: 2023-05-24

## 2023-03-21 MED ORDER — GERHARDT'S BUTT CREAM
1.0000 | TOPICAL_CREAM | CUTANEOUS | 0 refills | Status: DC | PRN
Start: 2023-03-21 — End: 2023-05-24

## 2023-03-21 MED ORDER — DOCUSATE SODIUM 100 MG PO CAPS
100.0000 mg | ORAL_CAPSULE | Freq: Every day | ORAL | 2 refills | Status: AC
Start: 2023-03-21 — End: ?

## 2023-03-21 MED ORDER — AMLODIPINE BESYLATE 5 MG PO TABS
5.0000 mg | ORAL_TABLET | Freq: Every day | ORAL | 11 refills | Status: AC
Start: 2023-03-21 — End: ?

## 2023-03-21 MED ORDER — ACETAMINOPHEN EXTRA STRENGTH 500 MG PO TABS
500.0000 mg | ORAL_TABLET | Freq: Four times a day (QID) | ORAL | 2 refills | Status: AC | PRN
Start: 2023-03-21 — End: ?

## 2023-03-21 MED ORDER — DULOXETINE HCL 30 MG PO CPEP
ORAL_CAPSULE | ORAL | 2 refills | Status: DC
Start: 2023-03-21 — End: 2023-05-03

## 2023-03-21 MED ORDER — CARVEDILOL 12.5 MG PO TABS
12.5000 mg | ORAL_TABLET | Freq: Two times a day (BID) | ORAL | 11 refills | Status: AC
Start: 2023-03-21 — End: ?

## 2023-03-21 NOTE — Patient Instructions (Signed)
Visit Information  [name] was given information about Medicaid Managed Care team care coordination services as a part of their [facility] Medicaid benefit. Jeffrey Fitzgerald verbally consented to engagement with the [facility] Managed Care team.   If you are experiencing a medical emergency, please call 911 or report to your local emergency department or urgent care.   If you have a non-emergency medical problem during routine business hours, please contact your provider's office and ask to speak with a nurse.   For questions related to your [facility], please call: [phone] or visit the homepage here: [email or web address]  If you would like to schedule transportation through your [facility], please call the following number at least 2 days in advance of your appointment: [phone]   Rides for urgent appointments can also be made after hours by calling Member Services.  Call the Behavioral Health Crisis Line at [phone], at any time, 24 hours a day, 7 days a week. If you are in danger or need immediate medical attention call 911.  If you would like help to quit smoking, call 1-800-QUIT-NOW ((720) 840-6844) OR Espaol: 1-855-Djelo-Ya (2-536-644-0347) o para ms informacin haga clic aqu or Text READY to 425-956 to register via text  Following is a copy of your plan of care:  Care Plan : LCSW Plan of Care  Updates made by [name], LCSW since 03/21/2023 12:00 AM     Problem: Quality of Life (General Plan of Care)      Goal: Quality of Life Maintained   Start Date: 01/06/2023  Note:   Priority: High  Timeframe:  Long-Range Goal Priority:  High Start Date:   01/06/23           Expected End Date:  ongoing                     Follow Up Date--04/19/23 at 9am  Current barriers:   Level of care concerns and need for SNF placement Physical and  mental health concerns  Need for Financial Assistance and connection to available community resources Need for education on SNF placement process and education on facilities nearby in order to chose a facility they would like placement  Loss of recent caregiver support due to niece becoming ill (mother is now primary caregiver other than part time CNA [name] that comes in)  Clinical Goals: Patient's mother we work with agencies/resources/PCP/Internal Medicine LCSW, [facility] LCSW to address needs related to gaining SNF placement and gaining desired physical therapy treatment at a safe level of care   Patient Goals/Self-Care Activities: Over the next 30 days Complete review of emailed resources  Call Social Services to discuss resources and support from [facility] caseworker Stay in contact with PCP office     24- Hour Availability:    [facility]  [address] [place], [place] [phone] Crisis [phone]   Family Service of the [facility] [phone]  [place] [facility]  [phone]    [facility]  [phone] (after hours)   Therapeutic Alternative/Mobile Crisis   [phone]   [place] National Suicide Hotline  517-209-6915 [place]) [place] 627   Call 9208378053 for mental health emergencies   [facility]  [phone]);  [place] and [employer]  [phone]); [place], [place], [place], [place], [place], [place], [place]    [place] [facility] for [facility] Residents For 24/7 walk-up access  to mental health services for [facility] children (4+), adolescents and adults, please visit the [facility] located at [address] in [place], [place].  *[place] also provides comprehensive outpatient behavioral health services in a variety of locations around the [place].  Connect With [place] [address] [place], [place]  [address] HelpLine: (815) 876-6938 or 1-507-629-4600  Get Directions  Find Help 24/7 By Phone Call our 24-hour HelpLine at 780-225-6188 or (202)359-4676 for immediate assistance for mental health and substance abuse issues.  Walk-In Help [place]: [facility] (Ages 4 and Up) [place]: Emergency Dept., [facility] Additional Resources National Hopeline Network: 1-800-SUICIDE The National Suicide Prevention Lifeline: 1-800-273-TALK     [name], BSW, MSW, LCSW Managed Medicaid LCSW [facility]  [name] [email or web address] Phone: [phone]

## 2023-03-21 NOTE — Patient Outreach (Signed)
Medicaid Managed Care Social Work Note  03/21/2023 Name:  Jeffrey Fitzgerald MRN:  [id number] DOB:  07-06-1971  Jeffrey Fitzgerald is an 52 y.o. year old male who is a primary patient of Jeffrey Morel, DO.  The Medicaid Managed Care Coordination team was consulted for assistance with:  Community Resources  Level of Care Concerns Mental Health Counseling and Resources Caregiver Stress Loss/grief related to bilateral AKA and lack of Independence   Jeffrey Fitzgerald was given information about Medicaid Managed Care Coordination team services today. Jeffrey Fitzgerald Patient and Primary Caregiver agreed to services and verbal consent obtained.  Engaged with patient  for by telephone forfollow up visit in response to referral for case management and/or care coordination services.   Assessments/Interventions:  Review of past medical history, allergies, medications, health status, including review of consultants reports, laboratory and other test data, was performed as part of comprehensive evaluation and provision of chronic care management services.  SDOH: (Social Determinant of Health) assessments and interventions performed: SDOH Interventions    Flowsheet Row Patient Outreach Telephone from 03/21/2023 in [place] POPULATION HEALTH DEPARTMENT Patient Outreach Telephone from 03/07/2023 in [place] POPULATION HEALTH DEPARTMENT Patient Outreach Telephone from 02/04/2023 in [place] POPULATION HEALTH DEPARTMENT Patient Outreach Telephone from 01/27/2023 in [place] POPULATION HEALTH DEPARTMENT Patient Outreach Telephone from 01/06/2023 in [place] POPULATION HEALTH DEPARTMENT Office Visit from 09/22/2022 in [facility]  SDOH Interventions        Food Insecurity Interventions -- -- -- -- -- Intervention Not Indicated  Housing Interventions -- -- -- -- Intervention Not Indicated Intervention Not Indicated  Transportation Interventions -- -- -- -- -- Intervention Not Indicated   Utilities Interventions -- -- -- -- -- Intervention Not Indicated  Financial Strain Interventions -- -- -- -- -- Intervention Not Indicated  Physical Activity Interventions -- Other (Comments)  [PCP message sent for PT request from family] -- -- -- --  Stress Interventions Offered [employer], Provide Counseling  [Due to no medication refills] Provide Counseling, Other (Comment)  [SNF placement education] [employer], Provide Counseling Offered [employer] Resources, Provide Counseling  [per mother] Offered [employer], Provide Counseling --       Advanced Directives Status:  Not addressed in this encounter.  Care Plan                 No Known Allergies  Medications Reviewed Today     Reviewed by Jeffrey Macadamia, MD (Resident) on 03/14/23 at 0929  Med List Status: <None>   Medication Order Taking? Sig Documenting Provider Last Dose Status Informant  Acetaminophen Extra Strength 500 MG TABS [id number] No TAKE ONE TABLET BY MOUTH EVERY SIX HOURS AS NEEDED FOR MODERATE PAIN  Patient not taking: Reported on 03/14/2023   Jeffrey Morel, DO Not Taking Active   amLODipine (NORVASC) 5 MG tablet [id number] Yes Take 1 tablet (5 mg total) by mouth daily. Jeffrey Morel, DO Taking Active   aspirin EC 81 MG tablet [id number] No Take 1 tablet (81 mg total) by mouth daily. Swallow whole.  Patient not taking: Reported on 03/14/2023   Jeffrey Morel, DO Not Taking Active   atorvastatin (LIPITOR) 80 MG tablet [id number] No TAKE ONE TABLET BY MOUTH AT BEDTIME  Patient not taking: Reported on 03/14/2023   Jeffrey Morel, DO Not Taking Active   carvedilol (COREG) 12.5 MG tablet [id number] Yes TAKE ONE TABLET BY MOUTH TWICE A DAY WITH MEALS Jeffrey Morel, DO Taking Active  docusate sodium (COLACE) 100 MG capsule [id number] No TAKE 1 CAPSULE (100 MG TOTAL) BY MOUTH DAILY (BOTTLE) Jeffrey Morel, DO Unknown Active   Nystatin (GERHARDT'S  BUTT CREAM) CREA [id number] Yes Apply 1 Application topically as needed for irritation (apply to periarea as needed for excoriation). Jeffrey Morel, DO Taking Active   sertraline (ZOLOFT) 25 MG tablet [id number] No TAKE ONE TABLET BY MOUTH AT BEDTIME  Patient not taking: Reported on 03/14/2023   Jeffrey Morel, DO Not Taking Active             Patient Active Problem List   Diagnosis Date Noted   Hyperlipidemia 03/14/2023   Healthcare maintenance 03/14/2023   Phantom limb pain (HCC) 03/14/2023   History of elevated glucose 03/14/2023   Pressure injury of sacral region, stage 1 01/22/2023   Scrotal erythema 01/22/2023   Physical deconditioning 01/12/2023   Abdominal hernia 09/24/2022   S/P aortobifemoral bypass surgery 06/29/2022   Depression 06/09/2022   Constipation 06/09/2022   S/P AKA (above knee amputation) bilateral (HCC) 06/09/2022   Malnutrition of moderate degree 05/29/2022   Muscle weakness of left upper extremity 05/27/2022   Tobacco dependence 05/27/2022   Adjustment reaction 05/27/2022   DNR (do not resuscitate) 05/27/2022   Acute postoperative anemia due to expected blood loss    Gastroesophageal reflux disease    Pressure injury of skin 04/13/2022   Anemia    PAD (peripheral artery disease) (HCC) 04/07/2022   Critical limb ischemia of both lower extremities (HCC) 04/01/2022   Iliac artery occlusion, 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   Hypertension 01/25/2020    Conditions to be addressed/monitored per PCP order:  Depression  Care Plan : LCSW Plan of Care  Updates made by Jeffrey Bryant, LCSW since 03/21/2023 12:00 AM     Problem: Quality of Life (General Plan of Care)      Goal: Quality of Life Maintained   Start Date: 01/06/2023  Note:   Priority: High  Timeframe:  Long-Range Goal Priority:  High Start Date:   01/06/23           Expected End Date:  ongoing                     Follow Up Date--04/19/23 at 9am  Current barriers:   Level of care concerns and  need for SNF placement Physical and mental health concerns  Need for Financial Assistance and connection to available community resources Need for education on SNF placement process and education on facilities nearby in order to chose a facility they would like placement  Loss of recent caregiver support due to niece becoming ill (mother is now primary caregiver other than part time CNA [name] that comes in)  Clinical Goals: Patient's mother we work with agencies/resources/PCP/Internal Medicine LCSW, [facility] LCSW to address needs related to gaining SNF placement and gaining desired physical therapy treatment at a safe level of care  Clinical Interventions:  Inter-disciplinary care team collaboration (see longitudinal plan of care) Parent provided patient history Assessment of needs, progress, barriers , and agencies contacted  Assessed need for SNF placement  Advised patient's caregivers to answer all calls from care providers and to keep phone nearby. Advised parents to contact 911 or [phone] if a crisis occurs.  Clinical interventions provided:Solution-Focused Strategies, Active listening / Reflection utilized , Problem Solving [name] ,  Past update-Two emails sent to patient's family with several documents regarding the ALF placement process, a complete list of ALF's in the area, and Medicaid  transfer Transport planner. 01/12/23- [facility] LCSW completed call to mother today and was informed that patient completed PCP visit yesterday and a HH referral was made but that she received a call today regarding this referral stating that since patient has no prosthesis, he is not eligible for physical therapy. I first spoke with patient's family on 01/06/23 and family seemed on board with long term ALF placement but after disclosing today that the state would take over patient's income once Medicaid transitions from community Medicaid to nursing facility Medicaid, family report that they just wish for patient to  be able to get stronger and be able to live in the home independently. Patient's previous primary caregiver [name], his niece is unable to provide as much case now so family is limited in support which was reminded to family. Family does have PCS involvement and a regular aide who they really like. [facility] LCSW will route encounter to PCP and PCP's LCSW and follow up next week. 01/20/23 update- Patient's mother reports that [facility] has started and wound care has been ordered by PCP. Mother is requesting assistance with SNF placement. [facility] LCSW provided education on requirements for short term placement. [facility] LCSW advised patient will need to be recommended for SNF placement but PT (this SNF recommendation has recently been documented in patient's chart) and a FL2 document signed by a physician. [facility] LCSW had mother physically write down some reminders: all apfuture appointments and to utilize [facility] benefits program and call the number on the back of patient's insurance card. [facility] LCSW completed care coordination with PCP through secure chat. Update 01/27/23- Patient's mother reports that she still has not received email with resources that [facility] LCSW sent 3 different times. Patient's mother contacted [name] and received correct email and [facility] LCSW resent resources out to this correct email [email or web address] on 01/27/23. [facility] LCSW will follow up in in 2 weeks. Update- Patient's mother reports that they have decided to go with [facility] but that patient's legal guardian (niece) will have to go and sign medical record forms in order to release information. However, niece is having a procedure down tomorrow and is experiencing health issues herself and will not be able to do this until next week. [facility] LCSW provided education on the SNF placement process to mother. Family is agreeable to [facility] LCSW making next follow up call with patient's Niece instead of patient's Mother. [facility] L[facility] spoke with patient and successfully gained consent  to discuss his health care with both his mother Tallen Schnorr and Caregiver Aide Beatriz Chancellor. Aide provided patient's healthcare updates. Family has been in communication with [facility] and was informed that he will need PT in the home before being eligible for SNF placement. [facility] LCSW will update PCP. [facility] LCSW scheduled family with [facility] RNCM for an initial appointment/assessment per family's wishes. Family has a caseworker involved named Jacki Cones from [facility] as well. Family reports that patient is suffering from isolation and wishes for socialization resources. [facility] LCSW sent email to family with community resource information. Update- Patient has not gained a PT referral but caregiver did state that she discussed this desire with physician during his office visit on 03/14/23. Family was upset during phone call today due to patient being without any medication for 7 days. Family has not contact PCP office but did speak with his pharmacy and they reported having no refills. Patient was prescribed Cymbalta by provider during office visit on 03/14/23 to assist with emotional and physical pain. [facility] LCSW collaborated  with [facility] Pharmacist and PCP office. PCP office will order all medications, update doctor and send over to [employer]. [facility] LCSW provided emotional support to patient, family and caregiver.    Patient Goals/Self-Care Activities: Over the next 30 days Complete review of emailed resources  Call Social Services to discuss resources and support from [facility] caseworker Stay in contact with PCP office     24- Hour Availability:    [facility]  [address] [place], [place] [phone] Crisis [phone]   Family Service of the [facility] [phone]  [place] Crisis Service  [phone]    [facility]  [phone] (after hours)   Therapeutic Alternative/Mobile Crisis   [phone]   [place] National Suicide  Hotline  684-418-6902 Len Childs) [place] [address]   Call [phone])203-6863 for mental health emergencies   [facility]  [phone]);  [place] and [employer]  [phone]); [place], [place], [place], [place], [place], [place], [place]    [place] [facility] Urgent Care for [facility] Residents For 24/7 walk-up access to mental health services for [facility] children (4+), adolescents and adults, please visit the [facility] located at [address] in [place], [place].  *[place] also provides comprehensive outpatient behavioral health services in a variety of locations around the [place].  Connect With [place] [address] [place], [place] [address] HelpLine: 641 107 3692 or 1-925 370 0078  Get Directions  Find Help 24/7 By Phone Call our 24-hour HelpLine at 475-087-0989 or (810)497-4297 for immediate assistance for mental health and substance abuse issues.  Walk-In Help [place]: [place] [facility] (Ages 4 and Up) [place]: Emergency Dept., [facility] Additional Resources National Hopeline Network: 1-800-SUICIDE The National Suicide Prevention Lifeline: 1-800-273-TALK            03/14/2023    9:37 AM 09/28/2022    3:18 PM 09/22/2022    4:48 PM  Depression screen PHQ 2/9  Decreased Interest 0 0 1  Down, Depressed, Hopeless 0 0 0  PHQ - 2 Score 0 0 1  Altered sleeping 0 0 0  Tired, decreased energy 0 0 0  Change in appetite 0 0 0  Feeling bad or failure about yourself  0 0 0  Trouble concentrating 0 0 0  Moving slowly or fidgety/restless 0 0 0  Suicidal thoughts 0  0  PHQ-9 Score 0 0 1  Difficult doing work/chores Not difficult at all  Not difficult at all    Follow up:  Patient agrees to Care Plan and Follow-up.  Plan: The Managed Medicaid care management team will reach out to the patient again over the next 30 days.  [name], BSW, MSW,  [name] Managed Medicaid LCSW [facility]  [name] [email or web address] Phone: [phone]

## 2023-03-21 NOTE — Telephone Encounter (Signed)
Theryl from Managed Medicaid called she is requesting refills for all of patients medication, rx for Duloxetine and Atorvastatin were sent in 7/15 per Theryl the pharmacy didn't get the rx. Patient is out of all of his medication and expressed the family is upset due to him not having any medication for about a week.

## 2023-03-25 ENCOUNTER — Ambulatory Visit: Payer: Medicaid Other | Admitting: *Deleted

## 2023-03-28 ENCOUNTER — Telehealth: Payer: Self-pay | Admitting: *Deleted

## 2023-03-28 NOTE — Telephone Encounter (Signed)
PCS forms faxed to [facility] Sue Lush [phone] / fax- [phone].)  for review /scheduling for services.

## 2023-03-29 ENCOUNTER — Encounter: Payer: Self-pay | Admitting: Gastroenterology

## 2023-03-29 ENCOUNTER — Telehealth: Payer: Self-pay | Admitting: *Deleted

## 2023-03-29 NOTE — Telephone Encounter (Signed)
Call from [place] regarding PCS form / assessment will not be scheduled until toward the end of the year.  His PCS will not expire until August or September 2024/ Lawson Fiscal to give me a call back @ [phone] with assessment appointment.

## 2023-04-07 ENCOUNTER — Other Ambulatory Visit: Payer: Medicaid Other | Admitting: *Deleted

## 2023-04-07 NOTE — Patient Outreach (Signed)
  Medicaid Managed Care   Unsuccessful Attempt Note   04/07/2023 Name: Jeffrey Fitzgerald MRN: [id number] DOB: 29-Jan-1971  Referred by: [name], DO Reason for referral : High Risk Managed Medicaid (Unsuccessful RNCM initial telephone outreach)   A second unsuccessful telephone outreach was attempted today. The patient was referred to the case management team for assistance with care management and care coordination.    Follow Up Plan: The Managed Medicaid care management team will reach out to the patient again over the next 7 days.    [name] RN, BSN [place]  Managed [facility] RN Care Coordinator [phone]

## 2023-04-07 NOTE — Patient Instructions (Signed)
Visit Information  [name]  - as a part of your Medicaid benefit, you are eligible for care management and care coordination services at no cost or copay. I was unable to reach you by phone today but would be happy to help you with your health related needs. Please feel free to call me @ [phone].   A member of the Managed Medicaid care management team will reach out to you again over the next 7 days.   [name] RN, BSN [place]  Managed [facility] RN Care Coordinator [phone]

## 2023-04-19 ENCOUNTER — Telehealth: Payer: Self-pay | Admitting: *Deleted

## 2023-04-19 ENCOUNTER — Other Ambulatory Visit: Payer: Medicaid Other | Admitting: Licensed Clinical Social Worker

## 2023-04-19 NOTE — Telephone Encounter (Signed)
Patient's HH Aide here to let doctor know that the Cymbalta is not working for the patient.  Would like to get something else for his pain.  Patient has been rescheduled for an earlier appointment as the pain is interrupting his sleep.

## 2023-04-19 NOTE — Patient Instructions (Signed)
Visit Information  Mr. Sitzes was given information about Medicaid Managed Care team care coordination services as a part of their [facility] Community Plan Medicaid benefit. Jaiceon Peggye Pitt motherverbally consented to engagement with the [facility] Managed Care team.   If you are experiencing a medical emergency, please call 911 or report to your local emergency department or urgent care.   If you have a non-emergency medical problem during routine business hours, please contact your provider's office and ask to speak with a nurse.   For questions related to your [facility], please call: [phone] or visit the homepage here: [email or web address]  If you would like to schedule transportation through your [facility], please call the following number at least 2 days in advance of your appointment: [phone]   Rides for urgent appointments can also be made after hours by calling Member Services.  Call the Behavioral Health Crisis Line at [phone], at any time, 24 hours a day, 7 days a week. If you are in danger or need immediate medical attention call 911.  If you would like help to quit smoking, call 1-800-QUIT-NOW (4690233473) OR Espaol: 1-855-Djelo-Ya (1-324-401-0272) o para ms informacin haga clic aqu or Text READY to 536-644 to register via text   Following is a copy of your plan of care:  Care Plan : LCSW Plan of Care  Updates made by [name], LCSW since 04/19/2023 12:00 AM     Problem: Quality of Life (General Plan of Care)      Goal: Quality of Life Maintained   Start Date: 01/06/2023  Note:   Priority: High  Timeframe:  Long-Range Goal Priority:  High Start Date:   01/06/23           Expected End Date:  ongoing                     Follow Up Date--05/20/23 at 10 am  Current barriers:   Level of care concerns and need for SNF  placement Physical and mental health concerns  Need for Financial Assistance and connection to available community resources Need for education on SNF placement process and education on facilities nearby in order to chose a facility they would like placement  Loss of recent caregiver support due to niece becoming ill (mother is now primary caregiver other than part time CNA [name] that comes in)  Clinical Goals: Patient's mother we work with agencies/resources/PCP/Internal Medicine LCSW, [facility] LCSW to address needs related to gaining SNF placement and gaining desired physical therapy treatment at a safe level of care  Patient Goals/Self-Care Activities: Over the next 30 days Complete review of emailed resources  Call Social Services to discuss resources and support from [facility] caseworker Stay in contact with PCP office     24- Hour Availability:    [facility]  [address] [place], [place] [phone] Crisis [phone]   Family Service of the [facility] [phone]  [place] Crisis Service  [phone]    [facility]  [phone] (after hours)   Therapeutic Alternative/Mobile Crisis   [phone]   [place] National Suicide Hotline  864-421-1961 Len Childs) [place] [address]   Call (780)354-5134 for mental health emergencies   [facility]  [phone]);  [place] and [employer]  [phone]); [place], [place], [place], [place], [place], [place], [place]    [place] Health Urgent Care for [facility] Residents For 24/7 walk-up  access to mental health services for [facility] children (4+), adolescents and adults, please visit the [facility] located at [address] in [place], [place].  *[place] also provides comprehensive outpatient behavioral health services in a variety of locations around the [place].  Connect With [place] [address] [place], [place] [address] HelpLine: 864-686-0318 or 1-425-190-2993  Get Directions  Find Help 24/7 By Phone Call our 24-hour HelpLine at (229) 085-3747 or 430-003-2558 for immediate assistance for mental health and substance abuse issues.  Walk-In Help [place]: [facility] (Ages 4 and Up) [place]: Emergency Dept., [facility] Additional Resources National Hopeline Network: 1-800-SUICIDE The National Suicide Prevention Lifeline: 1-800-273-TALK     [name], BSW, MSW, LCSW Managed Medicaid LCSW [facility]  [name] [email or web address] Phone: [phone]

## 2023-04-19 NOTE — Patient Outreach (Signed)
Medicaid Managed Care Social Work Note  04/19/2023 Name:  Jeffrey Fitzgerald MRN:  [id number] DOB:  Feb 23, 1971  Jeffrey Fitzgerald is an 52 y.o. year old male who is a primary patient of Jeffrey Morel, DO.  The Medicaid Managed Care Coordination team was consulted for assistance with:  Mental Health Counseling and Resources  Jeffrey Fitzgerald was given information about Medicaid Managed Care Coordination team services today. Cap Peggye Pitt 's mother (primary caregiver)Primary Caregiver agreed to services and verbal consent obtained.  Engaged with patient  for by telephone forfollow up visit in response to referral for case management and/or care coordination services.   Assessments/Interventions:  Review of past medical history, allergies, medications, health status, including review of consultants reports, laboratory and other test data, was performed as part of comprehensive evaluation and provision of chronic care management services.  SDOH: (Social Determinant of Health) assessments and interventions performed: SDOH Interventions    Flowsheet Row Patient Outreach Telephone from 04/19/2023 in [place] [facility] POPULATION HEALTH DEPARTMENT Patient Outreach Telephone from 03/21/2023 in [place] POPULATION HEALTH DEPARTMENT Patient Outreach Telephone from 03/07/2023 in [place] POPULATION HEALTH DEPARTMENT Patient Outreach Telephone from 02/04/2023 in [place] POPULATION HEALTH DEPARTMENT Patient Outreach Telephone from 01/27/2023 in [place] POPULATION HEALTH DEPARTMENT Patient Outreach Telephone from 01/06/2023 in  POPULATION HEALTH DEPARTMENT  SDOH Interventions        Housing Interventions -- -- -- -- -- Intervention Not Indicated  Physical Activity Interventions -- -- Other (Comments)  [PCP message sent for PT request from family] -- -- --  Stress Interventions Offered [employer], Provide Counseling  [Family still are seeking pain relief for patient] [employer], Provide Counseling  [Due to no medication refills] Provide Counseling, Other (Comment)  [SNF placement education] [employer], Provide Counseling Offered [employer], Provide Counseling  [per mother] Offered [employer], Provide Counseling       Advanced Directives Status:  See Care Plan for related entries.  Care Plan                 No Known Allergies  Medications Reviewed Today   Medications were not reviewed in this encounter     Patient Active Problem List   Diagnosis Date Noted   Hyperlipidemia 03/14/2023   Healthcare maintenance 03/14/2023   Phantom limb pain (HCC) 03/14/2023   History of elevated glucose 03/14/2023   Pressure injury of sacral region, stage 1 01/22/2023   Scrotal erythema 01/22/2023   Physical deconditioning 01/12/2023   Abdominal hernia 09/24/2022   S/P aortobifemoral bypass surgery 06/29/2022   Depression 06/09/2022   Constipation 06/09/2022   S/P AKA (above knee amputation) bilateral (HCC) 06/09/2022   Malnutrition of moderate degree 05/29/2022   Muscle weakness of left upper extremity 05/27/2022   Tobacco dependence 05/27/2022   Adjustment reaction 05/27/2022   DNR (do not resuscitate) 05/27/2022   Acute postoperative anemia due to expected blood loss    Gastroesophageal reflux disease    Pressure injury of skin 04/13/2022   Anemia    PAD (peripheral artery disease) (HCC) 04/07/2022   Critical limb ischemia of both lower extremities (HCC) 04/01/2022   Iliac artery occlusion, 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   Hypertension 01/25/2020    Care Plan : LCSW Plan of Care  Updates made by [name], LCSW since 04/19/2023 12:00 AM     Problem: Quality of Life (General Plan of Care)      Goal: Quality of Life Maintained  Start Date: 01/06/2023  Note:   Priority: High  Timeframe:  Long-Range Goal Priority:  High Start Date:   01/06/23           Expected End Date:   ongoing                     Follow Up Date--05/20/23 at 10 am  Current barriers:   Level of care concerns and need for SNF placement Physical and mental health concerns  Need for Financial Assistance and connection to available community resources Need for education on SNF placement process and education on facilities nearby in order to chose a facility they would like placement  Loss of recent caregiver support due to niece becoming ill (mother is now primary caregiver other than part time CNA [name] that comes in)  Clinical Goals: Patient's mother we work with agencies/resources/PCP/Internal Medicine LCSW, [facility] LCSW to address needs related to gaining SNF placement and gaining desired physical therapy treatment at a safe level of care  Clinical Interventions:  Inter-disciplinary care team collaboration (see longitudinal plan of care) Parent provided patient history Assessment of needs, progress, barriers , and agencies contacted  Assessed need for SNF placement  Advised patient's caregivers to answer all calls from care providers and to keep phone nearby. Advised parents to contact 911 or [phone] if a crisis occurs.  Clinical interventions provided:Solution-Focused Strategies, Active listening / Reflection utilized , Problem Solving [name] ,  Past update-Two emails sent to patient's family with several documents regarding the ALF placement process, a complete list of ALF's in the area, and Medicaid transfer educational materials. 01/12/23- [facility] LCSW completed call to mother today and was informed that patient completed PCP visit yesterday and a HH referral was made but that she received a call today regarding this referral stating that since patient has no prosthesis, he is not eligible for physical therapy. I first spoke with patient's family on 01/06/23 and family seemed on board with long term ALF placement but after disclosing today that the state would take over patient's income once Medicaid  transitions from community Medicaid to nursing facility Medicaid, family report that they just wish for patient to be able to get stronger and be able to live in the home independently. Patient's previous primary caregiver [name], his niece is unable to provide as much case now so family is limited in support which was reminded to family. Family does have PCS involvement and a regular aide who they really like. [facility] LCSW will route encounter to PCP and PCP's LCSW and follow up next week. 01/20/23 update- Patient's mother reports that [facility] has started and wound care has been ordered by PCP. Mother is requesting assistance with SNF placement. [facility] LCSW provided education on requirements for short term placement. [facility] LCSW advised patient will need to be recommended for SNF placement but PT (this SNF recommendation has recently been documented in patient's chart) and a FL2 document signed by a physician. [facility] LCSW had mother physically write down some reminders: all apfuture appointments and to utilize [facility] benefits program and call the number on the back of patient's insurance card. [facility] LCSW completed care coordination with PCP through secure chat. Update 01/27/23- Patient's mother reports that she still has not received email with resources that [facility] LCSW sent 3 different times. Patient's mother contacted [name] and received correct email and [facility] LCSW resent resources out to this correct email [email or web address] on 01/27/23. [facility] LCSW will follow up in in 2 weeks. Update-  Patient's mother reports that they have decided to go with [facility] but that patient's legal guardian (niece) will have to go and sign medical record forms in order to release information. However, niece is having a procedure down tomorrow and is experiencing health issues herself and will not be able to do this until next week. [facility] LCSW provided education on the SNF placement process to mother. Family is agreeable to [facility] LCSW making next follow up call  with patient's Niece instead of patient's Mother. [facility] spoke with patient and successfully gained consent to discuss his health care with both his mother Rahat Voorhees and Caregiver Aide Beatriz Chancellor. Aide provided patient's healthcare updates. Family has been in communication with [facility] and was informed that he will need PT in the home before being eligible for SNF placement. [facility] LCSW will update PCP. [facility] LCSW scheduled family with [facility] RNCM for an initial appointment/assessment per family's wishes. Family has a caseworker involved named Jacki Cones from [facility] as well. Family reports that patient is suffering from isolation and wishes for socialization resources. [facility] LCSW sent email to family with community resource information. Update- Patient has not gained a PT referral but caregiver did state that she discussed this desire with physician during his office visit on 03/14/23. Family was upset during phone call today due to patient being without any medication for 7 days. Family has not contact PCP office but did speak with his pharmacy and they reported having no refills. Patient was prescribed Cymbalta by provider during office visit on 03/14/23 to assist with emotional and physical pain. [facility] LCSW collaborated with [facility] Pharmacist and PCP office. PCP office will order all medications, update doctor and send over to [employer]. [facility] LCSW provided emotional support to patient, family and caregiver. 04/19/23 update- Pt continues to receive PCS. Mother reports that she has been trying more than ever to get patient to do more for himself like answer phone calls from medical providers. Patient did not answer last [facility] RNCM's outreach attempt and mother shares that his health continues to decline as well as his motivation for change. She shares that she would like to reschedule [facility] RNCM's appointment and have her contact herself (mother) as their aide [name] has picked up a new client and will be  very busy with this new transition. Mother is experiencing severe caregiver strain. She reports frustration for patient's unwillingness to do what is needed in order for him to live independently and safely in the community. However, she does acknowledge his severe chronic pain that seems to be getting worse. Family report interest in Short Term Rehab and [facility] LCSW informed them that in order for insurance to pay for that, it has to be justified that is indeed a level of recommended care by a medical provider such as PT and that sometimes insurances require a 3 day hospitalization for SNF placement if it is short-term. Family to decline LTC but reports ongoing frustration and caregiver strain. [facility] LCSW provided an extensive amount of emotional support to mother. [facility] LCSW successfully rescheduled initial with [facility] RNCM. [facility] LCSW will follow up in 30 days.    Patient Goals/Self-Care Activities: Over the next 30 days Complete review of emailed resources  Call Social Services to discuss resources and support from [facility] caseworker Stay in contact with PCP office     24- Hour Availability:    [facility]  [address] [place], [place] [phone] Crisis [phone]   Family  Service of the [facility] [phone]  [facility] Crisis Service  [phone]    [facility] Crisis Services  [phone] (after hours)   Therapeutic Alternative/Mobile Crisis   [phone]   [place] National Suicide Hotline  9053610235 Len Childs) [place] 623   Call [phone] for mental health emergencies   [facility]  [phone]);  [place] and [employer]  [phone]); [place], [place], [place], [place], [place], [place], [place]    [place] [facility] for [facility] Residents For 24/7 walk-up access to mental health services for [facility] children (4+), adolescents and adults, please visit the [facility] located at [address] in [place], [place].  *[place] also provides comprehensive outpatient behavioral health services in a variety of locations around the [place].  Connect With [place] [address] [place], [place] [address] HelpLine: 856 628 3697 or 1-7131196365  Get Directions  Find Help 24/7 By Phone Call our 24-hour HelpLine at 240-189-8917 or 248-191-8985 for immediate assistance for mental health and substance abuse issues.  Walk-In Help [place]: [facility] [place] [facility] (Ages 4 and Up) [place]: Emergency Dept., [facility] Additional Resources National Hopeline Network: 1-800-SUICIDE The National Suicide Prevention Lifeline: 1-800-273-TALK          Follow up:  Patient agrees to Care Plan and Follow-up.  Plan: The Managed Medicaid care management team will reach out to the patient again over the next 30 days.  [name], BSW, MSW, [name] Managed Medicaid LCSW [facility]  [name] [email or web address] Phone: [phone]

## 2023-04-21 ENCOUNTER — Telehealth: Payer: Self-pay

## 2023-04-21 NOTE — Telephone Encounter (Addendum)
Spoke with Pt's MotherMarcelus Fitzgerald) to informed her paperwork for CAP has been completed and faxed to [facility] @ [phone]. Original is ready for pick up. Paperwork will be left at he front desk inside the cabin. Copy will be scanned into pt chart.

## 2023-04-25 ENCOUNTER — Other Ambulatory Visit: Payer: Self-pay | Admitting: Student

## 2023-04-25 DIAGNOSIS — F32A Depression, unspecified: Secondary | ICD-10-CM

## 2023-04-26 ENCOUNTER — Other Ambulatory Visit: Payer: Medicaid Other | Admitting: Licensed Clinical Social Worker

## 2023-04-26 ENCOUNTER — Telehealth: Payer: Self-pay | Admitting: Student

## 2023-04-26 NOTE — Patient Instructions (Signed)
Visit Information  Jeffrey Fitzgerald was given information about Medicaid Managed Care team care coordination services as a part of their [facility] Community Plan Medicaid benefit. Jeffrey Fitzgerald verbally consented to engagement with the [facility] Managed Care team.   If you are experiencing a medical emergency, please call 911 or report to your local emergency department or urgent care.   If you have a non-emergency medical problem during routine business hours, please contact your provider's office and ask to speak with a nurse.   For questions related to your [facility], please call: [phone] or visit the homepage here: [email or web address]  If you would like to schedule transportation through your [facility], please call the following number at least 2 days in advance of your appointment: [phone]   Rides for urgent appointments can also be made after hours by calling Member Services.  Call the Behavioral Health Crisis Line at [phone]) [phone], at any time, 24 hours a day, 7 days a week. If you are in danger or need immediate medical attention call 911.  If you would like help to quit smoking, call 1-800-QUIT-NOW (405-648-4371) OR Espaol: 1-855-Djelo-Ya (8-416-606-3016) o para ms informacin haga clic aqu or Text READY to 010-932 to register via text  Following is a copy of your plan of care:  Care Plan : LCSW Plan of Care  Updates made by Jeffrey Bryant, LCSW since 04/26/2023 12:00 AM     Problem: Quality of Life (General Plan of Care)      Goal: Quality of Life Maintained   Start Date: 01/06/2023  Note:   Priority: High  Timeframe:  Long-Range Goal Priority:  High Start Date:   01/06/23           Expected End Date:  ongoing                     Follow Up Date--05/20/23 at 10 am  Current barriers:   Level of care concerns and need for SNF placement Physical and  mental health concerns  Need for Financial Assistance and connection to available community resources Need for education on SNF placement process and education on facilities nearby in order to chose a facility they would like placement  Loss of recent caregiver support due to niece becoming ill (mother is now primary caregiver other than part time CNA Jeffrey Fitzgerald that comes in)  Clinical Goals: Patient's mother we work with agencies/resources/PCP/Internal Medicine LCSW, [facility] LCSW to address needs related to gaining SNF placement and gaining desired physical therapy treatment at a safe level of care  Patient Goals/Self-Care Activities: Over the next 30 days Complete review of emailed resources  Call Social Services to discuss resources and support from [facility] caseworker Stay in contact with PCP office     24- Hour Availability:    [facility]  [address] [place], [place] [phone] Crisis [phone]   Family Service of the [facility] [phone]  [place] Crisis Service  [phone]    [facility]  [phone] (after hours)   Therapeutic Alternative/Mobile Crisis   [phone]   [place] National Suicide Hotline  7252015510 Len Childs) [place] 751   Call [phone] for mental health emergencies   [facility]  [phone]);  [place] and [employer]  [phone]); [place], [place], [place], [place], [place], [place], [place]    [place] [facility] for [facility] Residents For 24/7 walk-up access  to mental health services for [facility] children (4+), adolescents and adults, please visit the [facility] located at [address] in [place], [place].  *[place] also provides comprehensive outpatient behavioral health services in a variety of locations around the [place].  Connect With [place] [address] [place], [place]  [address] HelpLine: 848-816-3954 or 1-507-460-6947  Get Directions  Find Help 24/7 By Phone Call our 24-hour HelpLine at 705 184 4428 or (858)793-3036 for immediate assistance for mental health and substance abuse issues.  Walk-In Help [place]: [facility] (Ages 4 and Up) [place]: Emergency Dept., [facility] Additional Resources National Hopeline Network: 1-800-SUICIDE The National Suicide Prevention Lifeline: 1-800-273-TALK     [name], BSW, MSW, LCSW Managed Medicaid LCSW [facility]  [name] [email or web address] Phone: [phone]

## 2023-04-26 NOTE — Telephone Encounter (Signed)
Please call back to speak with Veleta Miners with [facility] in reference to his [facility] Services @ [phone].
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
## 2023-04-27 ENCOUNTER — Ambulatory Visit (AMBULATORY_SURGERY_CENTER): Payer: Medicaid Other | Admitting: *Deleted

## 2023-04-27 ENCOUNTER — Telehealth: Payer: Self-pay | Admitting: Licensed Clinical Social Worker

## 2023-04-27 VITALS — Ht 65.0 in | Wt 150.0 lb

## 2023-04-27 DIAGNOSIS — Z1211 Encounter for screening for malignant neoplasm of colon: Secondary | ICD-10-CM

## 2023-04-27 MED ORDER — PEG 3350-KCL-NA BICARB-NACL 420 G PO SOLR
4000.0000 mL | Freq: Once | ORAL | 0 refills | Status: AC
Start: 2023-04-27 — End: 2023-04-27
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
## 2023-04-27 NOTE — Progress Notes (Signed)
Pt's name and DOB verified at the beginning of the pre-visit.  Pt bilateral amputee in wheelchair.  Gave both [facility] main # and MD on call # prior to instructions.  No egg or soy allergy known to patient  No issues known to pt with past sedation with any surgeries or procedures Pt denies having issues being intubated Patient denies ever being intubated Pt has no issues moving head neck or swallowing No FH of Malignant Hyperthermia Pt is not on diet pills Pt is not on home 02  Pt is not on blood thinners  Pt denies issues with constipation  Pt has frequent issues with constipation RN instructed pt to use Miralax per bottles instructions a week before prep days. Pt states they will Pt is not on dialysis Pt denise any abnormal heart rhythms  Pt denies any upcoming cardiac testing Pt encouraged to use to use Singlecare or Goodrx to reduce cost  Patient's chart reviewed by Cathlyn Parsons CNRA prior to pre-visit and patient appropriate for the [facility].  Pre-visit completed and red dot placed by patient's name on their procedure day (on provider's schedule).  . Visit in person with Mother and Caregiver present Pt states weight is 150 lb Instructed pt why it is important to and  to call if they have any changes in health or new medications. Directed them to the # given and on instructions.   Pt states they will.  Instructions reviewed with pt and pt states understanding. Instructed to review again prior to procedure. Pt states they will.  Instructions given to pt  and by my chart

## 2023-05-03 ENCOUNTER — Telehealth: Payer: Self-pay | Admitting: Licensed Clinical Social Worker

## 2023-05-03 ENCOUNTER — Other Ambulatory Visit: Payer: Self-pay

## 2023-05-03 ENCOUNTER — Other Ambulatory Visit: Payer: Medicaid Other | Admitting: *Deleted

## 2023-05-03 ENCOUNTER — Ambulatory Visit (INDEPENDENT_AMBULATORY_CARE_PROVIDER_SITE_OTHER): Payer: Medicaid Other | Admitting: Student

## 2023-05-03 ENCOUNTER — Encounter: Payer: Self-pay | Admitting: Student

## 2023-05-03 ENCOUNTER — Encounter: Payer: Self-pay | Admitting: *Deleted

## 2023-05-03 VITALS — BP 128/65 | HR 68 | Temp 97.7°F | Wt 150.0 lb

## 2023-05-03 DIAGNOSIS — Z89611 Acquired absence of right leg above knee: Secondary | ICD-10-CM | POA: Diagnosis not present

## 2023-05-03 DIAGNOSIS — Z89612 Acquired absence of left leg above knee: Secondary | ICD-10-CM | POA: Diagnosis not present

## 2023-05-03 DIAGNOSIS — Z23 Encounter for immunization: Secondary | ICD-10-CM

## 2023-05-03 DIAGNOSIS — G546 Phantom limb syndrome with pain: Secondary | ICD-10-CM | POA: Diagnosis not present

## 2023-05-03 MED ORDER — PREGABALIN 75 MG PO CAPS: 75.0000 mg | ORAL_CAPSULE | Freq: Three times a day (TID) | ORAL | 2 refills | Status: AC

## 2023-05-03 NOTE — Patient Outreach (Signed)
Medicaid Managed Care   Nurse Care Manager Note  05/03/2023 Name:  Jeffrey Fitzgerald MRN:  [id number] DOB:  23-Sep-1970  Jeffrey Fitzgerald is an 52 y.o. year old male who is a primary Jeffrey Fitzgerald of Jeffrey Morel, DO.  The [facility] Managed Care Coordination team was consulted for assistance with:    Bilat AKA  Jeffrey Fitzgerald was given information about Medicaid Managed Care Coordination team services today. Jeffrey Fitzgerald Jeffrey Fitzgerald agreed to services and verbal consent obtained.  Engaged with Jeffrey Fitzgerald by telephone for initial visit in response to provider referral for case management and/or care coordination services.   Assessments/Interventions:  Review of past medical history, allergies, medications, health status, including review of consultants reports, laboratory and other test data, was performed as part of comprehensive evaluation and provision of chronic care management services.  SDOH (Social Determinants of Health) assessments and interventions performed: SDOH Interventions    Flowsheet Row Jeffrey Fitzgerald Outreach Telephone from 05/03/2023 in [place] POPULATION HEALTH DEPARTMENT Jeffrey Fitzgerald Outreach Telephone from 04/26/2023 in [place] POPULATION HEALTH DEPARTMENT Jeffrey Fitzgerald Outreach Telephone from 04/19/2023 in [place] POPULATION HEALTH DEPARTMENT Jeffrey Fitzgerald Outreach Telephone from 03/21/2023 in [place] POPULATION HEALTH DEPARTMENT Jeffrey Fitzgerald Outreach Telephone from 03/07/2023 in [place] POPULATION HEALTH DEPARTMENT Jeffrey Fitzgerald Outreach Telephone from 02/04/2023 in [place] POPULATION HEALTH DEPARTMENT  SDOH Interventions        Food Insecurity Interventions Intervention Not Indicated -- -- -- -- --  Housing Interventions Intervention Not Indicated -- -- -- -- --  Transportation Interventions Intervention Not Indicated -- -- -- -- --  Utilities Interventions Intervention Not Indicated -- -- -- -- --  Physical Activity Interventions -- -- -- -- Other (Comments)  [PCP message sent for PT request from  family] --  Stress Interventions -- [employer], Provide Counseling Offered [employer], Provide Counseling  [Family still are seeking pain relief for Jeffrey Fitzgerald] [employer], Provide Counseling  [Due to no medication refills] Provide Counseling, Other (Comment)  [SNF placement education] [employer], Provide Counseling       Care Plan  No Known Allergies  Medications Reviewed Today     Reviewed by Heidi Dach, RN (Registered Nurse) on 05/03/23 at [phone]  Med List Status: <None>   Medication Order Taking? Sig Documenting Provider Last Dose Status Informant  Acetaminophen Extra Strength 500 MG TABS [id number] Yes Take 1 tablet (500 mg total) by mouth every 6 (six) hours as needed. Jeffrey Morel, DO  Active   amLODipine (NORVASC) 5 MG tablet [id number] Yes Take 1 tablet (5 mg total) by mouth daily. Jeffrey Morel, DO  Active   aspirin EC 81 MG tablet [id number] Yes Take 1 tablet (81 mg total) by mouth daily. Swallow whole. Jeffrey Morel, DO  Active   atorvastatin (LIPITOR) 80 MG tablet [id number] Yes Take 1 tablet (80 mg total) by mouth at bedtime. Jeffrey Morel, DO  Active   carvedilol (COREG) 12.5 MG tablet [id number] Yes Take 1 tablet (12.5 mg total) by mouth 2 (two) times daily with a meal. Jeffrey Morel, DO  Active   docusate sodium (COLACE) 100 MG capsule [id number]  Take 1 capsule (100 mg total) by mouth daily.  Jeffrey Fitzgerald not taking: Reported on 04/27/2023   Jeffrey Morel, DO  Active   DULoxetine (CYMBALTA) 30 MG capsule [id number] Yes Take 1 capsule (30 mg total) by mouth daily for 7 days, THEN 2 capsules (60 mg total) daily. Jeffrey Morel, DO  Active   Nystatin (GERHARDT'S BUTT CREAM) CREA [id number]  Yes Apply 1 Application topically as needed for irritation (apply to periarea as needed for excoriation). Jeffrey Morel, DO  Active             Jeffrey Fitzgerald Active Problem List   Diagnosis  Date Noted   Hyperlipidemia 03/14/2023   Healthcare maintenance 03/14/2023   Phantom limb pain (HCC) 03/14/2023   History of elevated glucose 03/14/2023   Pressure injury of sacral region, stage 1 01/22/2023   Scrotal erythema 01/22/2023   Physical deconditioning 01/12/2023   Abdominal hernia 09/24/2022   S/P aortobifemoral bypass surgery 06/29/2022   Depression 06/09/2022   Constipation 06/09/2022   S/P AKA (above knee amputation) bilateral (HCC) 06/09/2022   Malnutrition of moderate degree 05/29/2022   Muscle weakness of left upper extremity 05/27/2022   Tobacco dependence 05/27/2022   Adjustment reaction 05/27/2022   DNR (do not resuscitate) 05/27/2022   Acute postoperative anemia due to expected blood loss    Gastroesophageal reflux disease    Pressure injury of skin 04/13/2022   Anemia    PAD (peripheral artery disease) (HCC) 04/07/2022   Critical limb ischemia of both lower extremities (HCC) 04/01/2022   Iliac artery occlusion, 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 today. He expressed desire for prosthetics and to regain his independence. [name] receives PCS 7 days a week for 2 hours each day. He lives with his sister and she helps him at other times. He has no income and has applied for disability.  RNCM Clinical Goal(s):  Jeffrey Fitzgerald will verbalize understanding of plan for management of Bilat AKA as evidenced by Jeffrey Fitzgerald reports take all  medications exactly as prescribed and will call provider for medication related questions as evidenced by Jeffrey Fitzgerald reports    attend all scheduled medical appointments: 05/03/23 with PCP as evidenced by provider documentation        continue to work with RN Care Manager and/or Social Worker to address care management and care coordination needs related to bilat AKA as evidenced by adherence to CM Team Scheduled appointments     work with Child psychotherapist to address Level of care concerns related to the management of Bilat AKA as evidenced by review of EMR and Jeffrey Fitzgerald or Child psychotherapist report     through collaboration with Medical illustrator, provider, and care team.   Interventions: Evaluation of current treatment plan related to  self management and Jeffrey Fitzgerald's adherence to plan as established by provider   Bilat AKA  (Status: New goal.) Long Term Goal  Evaluation of current treatment plan related to  Bilat AKA ,  self-management and Jeffrey Fitzgerald's adherence to plan as established by provider. Discussed plans with Jeffrey Fitzgerald for ongoing care management follow up and provided Jeffrey Fitzgerald with direct contact information for care management team Provided education to Jeffrey Fitzgerald re: exercises to do while sitting; Reviewed medications with Jeffrey Fitzgerald and discussed advised Jeffrey Fitzgerald to take all medications to PCP appointment  today; Collaborated with [facility] regarding prosthetics, conference call made with Jeffrey Fitzgerald; Reviewed scheduled/upcoming provider appointments including PCP today at 3:45pm; Social Work referral for assistance with LTC placement; Assessed social determinant of health barriers;  Collaborated with PCP, requesting prescription for prosthetics, facesheet and medical notes faxed to [facility] [phone] Discussed the importance in keeping his body healthy and strong, advised Jeffrey Fitzgerald to work on strengthening his upper body Advised Jeffrey Fitzgerald to get out of the bed everyday, explained the benefits of changing  position Explained to Jeffrey Fitzgerald to have assistance with toileting at least 4-5 times a day or more if needed Advised Jeffrey Fitzgerald to discuss needing a bedside commode and shower chair with PCP during visit today  Jeffrey Fitzgerald Goals/Self-Care Activities: Attend all scheduled provider appointments Call provider office for new concerns or questions  Work with the social worker to address care coordination needs and will continue to work with the clinical team to address health care and disease management related needs Work on strengthening upper body       Follow Up:  Jeffrey Fitzgerald agrees to Care Plan and Follow-up.  Plan: The Managed Medicaid care management team will reach out to the Jeffrey Fitzgerald again over the next 14 days.  Date/time of next scheduled RN care management/care coordination outreach:  05/17/23 at 9am  [name] RN, BSN [place]  [facility] RN Care Coordinator [phone]

## 2023-05-03 NOTE — Patient Outreach (Signed)
Care Coordination Documentation   05/03/2023  Jeffrey Fitzgerald 11/17/1970 [id number]  Care coordination completed with PCP and entire [facility] team regarding need for LTC placement which will require PT recommendation and FL2 completion.  [name], BSW, MSW, [name] Managed Medicaid LCSW [facility]  [name] [name] Phone: [phone]

## 2023-05-03 NOTE — Progress Notes (Signed)
CC: Phantom pain  HPI:  JeffreyJeffrey Fitzgerald is a 52 y.o. male with a past medical history of hypertension, status post bilateral AKA's, hyperlipidemia, iliac artery occlusion, PAD presenting for refill of medications.  Please see assessment and plan for full HPI.  Medications: Hypertension: Coreg 12.5 mg twice daily, amlodipine 5 mg daily Hyperlipidemia: Atorvastatin 80 mg nightly Phantom limb pain: Lyrica 75 mg 3 times daily  Past Medical History:  Diagnosis Date   Diabetes mellitus without complication (HCC)    Hyperlipidemia    Hypertension    Peripheral vascular disease (HCC)    Tobacco abuse      Current Outpatient Medications:    pregabalin (LYRICA) 75 MG capsule, Take 1 capsule (75 mg total) by mouth 3 (three) times daily., Disp: 90 capsule, Rfl: 2   Acetaminophen Extra Strength 500 MG TABS, Take 1 tablet (500 mg total) by mouth every 6 (six) hours as needed., Disp: 30 tablet, Rfl: 2   amLODipine (NORVASC) 5 MG tablet, Take 1 tablet (5 mg total) by mouth daily., Disp: 30 tablet, Rfl: 11   aspirin EC 81 MG tablet, Take 1 tablet (81 mg total) by mouth daily. Swallow whole., Disp: , Rfl:    atorvastatin (LIPITOR) 80 MG tablet, Take 1 tablet (80 mg total) by mouth at bedtime., Disp: 30 tablet, Rfl: 11   carvedilol (COREG) 12.5 MG tablet, Take 1 tablet (12.5 mg total) by mouth 2 (two) times daily with a meal., Disp: 60 tablet, Rfl: 11   docusate sodium (COLACE) 100 MG capsule, Take 1 capsule (100 mg total) by mouth daily. (Patient not taking: Reported on 04/27/2023), Disp: 30 capsule, Rfl: 2   Nystatin (GERHARDT'S BUTT CREAM) CREA, Apply 1 Application topically as needed for irritation (apply to periarea as needed for excoriation)., Disp: 1 each, Rfl: 0  Review of Systems:    MSK: Patient endorses leg pain     Physical Exam:  Vitals:   05/03/23 1604  BP: 128/65  Pulse: 68  Temp: 97.7 F (36.5 C)  TempSrc: Oral  SpO2: 100%  Weight: 150 lb (68 kg)   General: Patient  is sitting comfortably in the room  Head: Normocephalic, atraumatic  Cardio: Regular rate and rhythm, no murmurs, rubs or gallops Pulmonary: Clear to ausculation bilaterally with no rales, rhonchi, and crackles  MSK: Bilateral lower extremity AKA stumps appreciated, no abscesses, drainage, or erythema appreciated bilaterally.  Surgical scars noted bilaterally.   Assessment & Plan:   S/P AKA (above knee amputation) bilateral (HCC) Patient is status post bilateral AKA's. He states since the surgeries, he has been having phantom type leg pain.  He states that he has been on many medications for this, with no relief of the pain.  At most recent visit, patient was given duloxetine, and instructed to increase to 60 mg daily.  He presents today with the same pain.  He describes it to be a 10/10 in intensity.  He describes it as "a leg growing out of his leg "and he cannot sleep at night.  He states compliance with his duloxetine.  Even with his compliance, he states the pain is still intolerable.  Patient reports he would like to try something else for pain.  On exam, patient does not have any obvious masses or abscesses noted to bilateral AKA stumps.  Wounds have healed nicely.  Will try Lyrica.  Plan: -Discontinue Cymbalta -Start Lyrica 75 mg, 3 times daily, plan to increase to 3 times daily -Patient referred to physical therapy  for SNF placement -Patient referred to vascular surgery to start prosthetic placement process -Had conversation with patient about what expectations are about the pain and that potentially the pain might never go away, and this is a type of pain that we will try and make tolerable      Flu vaccine need Administered flu vaccine today.  Patient discussed with Dr. Oren Bracket, DO PGY-2 Internal Medicine Resident  Pager: [phone]

## 2023-05-03 NOTE — Assessment & Plan Note (Signed)
Patient is status post bilateral AKA's. He states since the surgeries, he has been having phantom type leg pain.  He states that he has been on many medications for this, with no relief of the pain.  At most recent visit, patient was given duloxetine, and instructed to increase to 60 mg daily.  He presents today with the same pain.  He describes it to be a 10/10 in intensity.  He describes it as "a leg growing out of his leg "and he cannot sleep at night.  He states compliance with his duloxetine.  Even with his compliance, he states the pain is still intolerable.  Patient reports he would like to try something else for pain.  On exam, patient does not have any obvious masses or abscesses noted to bilateral AKA stumps.  Wounds have healed nicely.  Will try Lyrica.  Plan: -Discontinue Cymbalta -Start Lyrica 75 mg, 3 times daily, plan to increase to 3 times daily -Patient referred to physical therapy for SNF placement -Patient referred to vascular surgery to start prosthetic placement process -Had conversation with patient about what expectations are about the pain and that potentially the pain might never go away, and this is a type of pain that we will try and make tolerable

## 2023-05-03 NOTE — Patient Instructions (Signed)
Visit Information  Mr. Maran was given information about Medicaid Managed Care team care coordination services as a part of their [facility] Community Plan Medicaid benefit. Manav Kennerson verbally consented to engagement with the [facility] Managed Care team.   If you are experiencing a medical emergency, please call 911 or report to your local emergency department or urgent care.   If you have a non-emergency medical problem during routine business hours, please contact your provider's office and ask to speak with a nurse.   For questions related to your [facility], please call: [phone] or visit the homepage here: [email or web address]  If you would like to schedule transportation through your [facility], please call the following number at least 2 days in advance of your appointment: [phone]   Rides for urgent appointments can also be made after hours by calling Member Services.  Call the Behavioral Health Crisis Line at [phone], at any time, 24 hours a day, 7 days a week. If you are in danger or need immediate medical attention call 911.  If you would like help to quit smoking, call 1-800-QUIT-NOW (303-181-1327) OR Espaol: 1-855-Djelo-Ya (5-956-387-5643) o para ms informacin haga clic aqu or Text READY to 329-518 to register via text  Mr. Garate,   Please see education materials related to upper body exercises provided by MyChart link. and as Financial risk analyst.   The patient verbalized understanding of instructions, educational materials, and care plan provided today and agreed to receive a mailed copy of patient instructions, educational materials, and care plan.   Telephone follow up appointment with Managed Medicaid care management team member scheduled for:05/17/23 @ 9am  [name] RN, BSN [place]  [facility]  Health RN Care Coordinator [phone]   Following is a copy of your plan of care:  Care Plan : RN Care Manager Plan of Care  Updates made by Heidi Dach, RN since 05/03/2023 12:00 AM     Problem: Health Management needs related to Bilat AKA      Long-Range Goal: Development of Plan of Care to address Health Management needs related to Bilat AKA   Start Date: 05/03/2023  Expected End Date: 08/01/2023  Note:   Current Barriers:  Knowledge Deficits related to plan of care for management of Bilat AKA RNCM spoke with Mr. Cure today. He expressed desire for prosthetics and to regain his independence. Mr. Reeves receives PCS 7 days a week for 2 hours each day. He lives with his sister and she helps him at other times. He has no income and has applied for disability.  RNCM Clinical Goal(s):  Patient will verbalize understanding of plan for management of Bilat AKA as evidenced by patient reports take all medications exactly as prescribed and will call provider for medication related questions as evidenced by patient reports    attend all scheduled medical appointments: 05/03/23 with PCP as evidenced by provider documentation        continue to work with RN Care Manager and/or Social Worker to address care management and care coordination needs related to bilat AKA as evidenced by adherence to CM Team Scheduled appointments     work with Child psychotherapist to address Level of care concerns related to the management of Bilat AKA as evidenced by review of EMR and patient or Child psychotherapist report     through collaboration with Medical illustrator, provider, and care team.   Interventions: Evaluation of current treatment plan related  to  self management and patient's adherence to plan as established by provider   Bilat AKA  (Status: New goal.) Long Term Goal  Evaluation of current treatment plan related to  Bilat AKA ,  self-management and patient's adherence to plan as established by  provider. Discussed plans with patient for ongoing care management follow up and provided patient with direct contact information for care management team Provided education to patient re: exercises to do while sitting; Reviewed medications with patient and discussed advised patient to take all medications to PCP appointment today; Collaborated with [facility] regarding prosthetics, conference call made with patient; Reviewed scheduled/upcoming provider appointments including PCP today at 3:45pm; Social Work referral for assistance with LTC placement; Assessed social determinant of health barriers;  Collaborated with PCP, requesting prescription for prosthetics, facesheet and medical notes faxed to [facility] [phone] Discussed the importance in keeping his body healthy and strong, advised patient to work on strengthening his upper body Advised patient to get out of the bed everyday, explained the benefits of changing position Explained to patient to have assistance with toileting at least 4-5 times a day or more if needed Advised patient to discuss needing a bedside commode and shower chair with PCP during visit today  Patient Goals/Self-Care Activities: Attend all scheduled provider appointments Call provider office for new concerns or questions  Work with the social worker to address care coordination needs and will continue to work with the clinical team to address health care and disease management related needs Work on strengthening upper body

## 2023-05-03 NOTE — Assessment & Plan Note (Signed)
Administered flu vaccine today.

## 2023-05-03 NOTE — Patient Instructions (Addendum)
Jeffrey Fitzgerald,Thank you for allowing me to take part in your care today.  Here are your instructions.  1. I have started you on Lyrica, which can help with phantom pain. You can take this 3 times a day. Stop taking the duloxetine.   2. I have referred you for physical therapy. Please await for them to call you for them to come to the house.   3. Return in about 1 month for [place] to assess the pain.   Thank you, Dr. Allena Katz  If you have any other questions please contact the internal medicine clinic at [phone]

## 2023-05-04 NOTE — Addendum Note (Signed)
Addended by: [name] on: 05/04/2023 07:03 AM   Modules accepted: Level of Service

## 2023-05-11 ENCOUNTER — Other Ambulatory Visit: Payer: Medicaid Other
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
## 2023-05-12 NOTE — Progress Notes (Signed)
Internal Medicine Clinic Attending  Case discussed with the resident at the time of the visit.  We reviewed the resident's history and exam and pertinent patient test results.  I agree with the assessment, diagnosis, and plan of care documented in the resident's note.  

## 2023-05-17 ENCOUNTER — Other Ambulatory Visit: Payer: Medicaid Other | Admitting: Licensed Clinical Social Worker

## 2023-05-17 ENCOUNTER — Other Ambulatory Visit: Payer: Medicaid Other | Admitting: *Deleted

## 2023-05-17 NOTE — Patient Outreach (Signed)
Medicaid Managed Care   Unsuccessful Attempt Note   05/17/2023 Name: Jeffrey Fitzgerald MRN: [id number] DOB: 1971/04/05  Referred by: [name], DO Reason for referral : High Risk Managed Medicaid (Unsuccessful RNCM follow up telephone outreach)   An unsuccessful telephone outreach was attempted today. The patient was referred to the case management team for assistance with care management and care coordination.    Follow Up Plan: The Managed Medicaid care management team will reach out to the patient again over the next 7 days.    [name] RN, BSN [place]  Value-Based Care Institute [facility] Health RN Care Coordinator [phone]

## 2023-05-17 NOTE — Patient Instructions (Signed)
Jeffrey Fitzgerald ,   The [facility] Managed Care Team is available to provide assistance to you with your healthcare needs at no cost and as a benefit of your [facility] Health plan. I'm sorry I was unable to reach you today for our scheduled appointment. Our care guide will call you to reschedule our telephone appointment. Please call me at the number below. I am available to be of assistance to you regarding your healthcare needs. .   Thank you,   [name], BSW, MSW, LCSW Managed Medicaid LCSW [facility]  [address] [email or web address] Phone: [phone]

## 2023-05-17 NOTE — Patient Instructions (Signed)
Visit Information  [name]  - as a part of your Medicaid benefit, you are eligible for care management and care coordination services at no cost or copay. I was unable to reach you by phone today but would be happy to help you with your health related needs. Please feel free to call me @ [phone].   A member of the Managed Medicaid care management team will reach out to you again over the next 7 days.   [name] RN, BSN [place]  Value-Based Care Institute [facility] Health RN Care Coordinator [phone]

## 2023-05-17 NOTE — Patient Outreach (Signed)
Medicaid Managed Care   Unsuccessful Attempt Note   05/17/2023 Name: Jeffrey Fitzgerald MRN: [id number] DOB: 1971/05/15  Referred by: [name], DO Reason for referral : No chief complaint on file.   A second unsuccessful telephone outreach was attempted today. The patient was referred to the case management team for assistance with care management and care coordination.    Follow Up Plan: The Managed Medicaid care management team will reach out to the patient again over the next 30 days.   [name], BSW, MSW, [name] Managed Medicaid LCSW [facility]  [name] [email or web address] Phone: [phone]

## 2023-05-20 ENCOUNTER — Ambulatory Visit: Payer: Medicaid Other | Admitting: Licensed Clinical Social Worker

## 2023-05-23 ENCOUNTER — Encounter: Payer: Medicaid Other | Admitting: Student

## 2023-05-23 NOTE — Progress Notes (Deleted)
52 year old male with HTN, PAD, bilateral AKA, and preDM 05/03/2023    Colonoscopy?

## 2023-05-24 ENCOUNTER — Other Ambulatory Visit: Payer: Self-pay | Admitting: Student

## 2023-05-24 DIAGNOSIS — L89309 Pressure ulcer of unspecified buttock, unspecified stage: Secondary | ICD-10-CM

## 2023-05-24 DIAGNOSIS — I10 Essential (primary) hypertension: Secondary | ICD-10-CM

## 2023-05-24 DIAGNOSIS — I739 Peripheral vascular disease, unspecified: Secondary | ICD-10-CM

## 2023-05-25 ENCOUNTER — Other Ambulatory Visit: Payer: Self-pay

## 2023-05-25 ENCOUNTER — Encounter: Payer: Medicaid Other | Admitting: Gastroenterology

## 2023-05-25 ENCOUNTER — Other Ambulatory Visit (HOSPITAL_COMMUNITY): Payer: Self-pay

## 2023-05-25 MED ORDER — GERHARDT'S BUTT CREAM
1.0000 | TOPICAL_CREAM | CUTANEOUS | 0 refills | Status: DC | PRN
Start: 2023-05-25 — End: 2023-05-25

## 2023-05-25 MED ORDER — ASPIRIN 81 MG PO TBEC
81.0000 mg | DELAYED_RELEASE_TABLET | Freq: Every day | ORAL | 3 refills | Status: AC
  Filled 2023-05-25: qty 90, 90d supply, fill #0

## 2023-05-25 MED ORDER — ASPIRIN 81 MG PO TBEC
81.0000 mg | DELAYED_RELEASE_TABLET | Freq: Every day | ORAL | Status: DC
Start: 2023-05-25 — End: 2023-05-25

## 2023-05-25 MED ORDER — GERHARDT'S BUTT CREAM
1.0000 | TOPICAL_CREAM | CUTANEOUS | 3 refills | Status: AC | PRN
  Filled 2023-05-25: qty 1, fill #0

## 2023-05-25 NOTE — Addendum Note (Signed)
Addended by: [name] on: 05/25/2023 02:14 PM   Modules accepted: Orders

## 2023-05-26 ENCOUNTER — Other Ambulatory Visit: Payer: Self-pay

## 2023-05-26 ENCOUNTER — Other Ambulatory Visit (HOSPITAL_COMMUNITY): Payer: Self-pay

## 2023-06-07 ENCOUNTER — Other Ambulatory Visit: Payer: Medicaid Other | Admitting: Licensed Clinical Social Worker

## 2023-06-07 NOTE — Patient Outreach (Addendum)
Medicaid Managed Care   Unsuccessful Attempt Note   06/07/2023 Name: Jeffrey Fitzgerald MRN: [id number] DOB: 1970/10/25  Referred by: [name], DO Reason for referral : No chief complaint on file.   An unsuccessful telephone outreach was attempted today. The patient was referred to the case management team for assistance with care management and care coordination.  PCP and [facility] team updated.  Follow Up Plan: The Managed Medicaid care management team will reach out to the patient again over the next 30 days.   [name], BSW, MSW, [name] Managed Medicaid LCSW [facility]  [name] [email or web address] Phone: [phone]

## 2023-06-07 NOTE — Patient Instructions (Signed)
Chaney Malling ,   The [facility] Managed Care Team is available to provide assistance to you with your healthcare needs at no cost and as a benefit of your [facility] Health plan. I'm sorry I was unable to reach you today for our scheduled appointment. Our care guide will call you to reschedule our telephone appointment. Please call me at the number below. I am available to be of assistance to you regarding your healthcare needs. .   Thank you,   [name], BSW, MSW, LCSW Managed Medicaid LCSW [facility]  [address] [email or web address] Phone: [phone]

## 2023-06-09 ENCOUNTER — Encounter: Payer: Medicaid Other | Admitting: Student

## 2023-06-09 NOTE — Progress Notes (Deleted)
52 year old male with HTN, severe PAD s/p bilateral AKA 05/03/2023 Discuss conditions at home   Colonoscopy?

## 2023-06-13 ENCOUNTER — Other Ambulatory Visit: Payer: Medicaid Other

## 2023-06-13 ENCOUNTER — Telehealth: Payer: Self-pay | Admitting: *Deleted

## 2023-06-13 ENCOUNTER — Emergency Department (HOSPITAL_COMMUNITY)
Admission: EM | Admit: 2023-06-13 | Discharge: 2023-06-14 | Disposition: A | Discharge status: Home / Self Care | Payer: Medicaid Other | Attending: Emergency Medicine | Admitting: Emergency Medicine

## 2023-06-13 ENCOUNTER — Other Ambulatory Visit: Payer: Self-pay

## 2023-06-13 DIAGNOSIS — T426X1A Poisoning by other antiepileptic and sedative-hypnotic drugs, accidental (unintentional), initial encounter: Secondary | ICD-10-CM | POA: Diagnosis not present

## 2023-06-13 DIAGNOSIS — D649 Anemia, unspecified: Secondary | ICD-10-CM | POA: Diagnosis not present

## 2023-06-13 DIAGNOSIS — I10 Essential (primary) hypertension: Secondary | ICD-10-CM | POA: Diagnosis not present

## 2023-06-13 DIAGNOSIS — T461X1A Poisoning by calcium-channel blockers, accidental (unintentional), initial encounter: Secondary | ICD-10-CM | POA: Diagnosis not present

## 2023-06-13 DIAGNOSIS — Z7982 Long term (current) use of aspirin: Secondary | ICD-10-CM | POA: Diagnosis not present

## 2023-06-13 DIAGNOSIS — Z79899 Other long term (current) drug therapy: Secondary | ICD-10-CM | POA: Insufficient documentation

## 2023-06-13 DIAGNOSIS — R7401 Elevation of levels of liver transaminase levels: Secondary | ICD-10-CM | POA: Diagnosis not present

## 2023-06-13 DIAGNOSIS — T447X1A Poisoning by beta-adrenoreceptor antagonists, accidental (unintentional), initial encounter: Secondary | ICD-10-CM | POA: Insufficient documentation

## 2023-06-13 DIAGNOSIS — T466X1A Poisoning by antihyperlipidemic and antiarteriosclerotic drugs, accidental (unintentional), initial encounter: Secondary | ICD-10-CM | POA: Insufficient documentation

## 2023-06-13 DIAGNOSIS — T50901A Poisoning by unspecified drugs, medicaments and biological substances, accidental (unintentional), initial encounter: Secondary | ICD-10-CM

## 2023-06-13 LAB — I-STAT CHEM 8, ED
BUN: 16 mg/dL (ref 6–20)
Calcium, Ion: 1.23 mmol/L (ref 1.15–1.40)
Chloride: 107 mmol/L (ref 98–111)
Creatinine, Ser: 1.1 mg/dL (ref 0.61–1.24)
Glucose, Bld: 88 mg/dL (ref 70–99)
HCT: 39 % (ref 39.0–52.0)
Hemoglobin: 13.3 g/dL (ref 13.0–17.0)
Potassium: 4.1 mmol/L (ref 3.5–5.1)
Sodium: 142 mmol/L (ref 135–145)
TCO2: 24 mmol/L (ref 22–32)

## 2023-06-13 LAB — RAPID URINE DRUG SCREEN, HOSP PERFORMED
Amphetamines: NOT DETECTED
Barbiturates: NOT DETECTED
Benzodiazepines: NOT DETECTED
Cocaine: NOT DETECTED
Opiates: NOT DETECTED
Tetrahydrocannabinol: NOT DETECTED

## 2023-06-13 LAB — CBC WITH DIFFERENTIAL/PLATELET
Abs Immature Granulocytes: 0.01 10*3/uL (ref 0.00–0.07)
Basophils Absolute: 0 10*3/uL (ref 0.0–0.1)
Basophils Relative: 1 %
Eosinophils Absolute: 0.1 10*3/uL (ref 0.0–0.5)
Eosinophils Relative: 1 %
HCT: 39.1 % (ref 39.0–52.0)
Hemoglobin: 12.4 g/dL — ABNORMAL LOW (ref 13.0–17.0)
Immature Granulocytes: 0 %
Lymphocytes Relative: 51 %
Lymphs Abs: 3.3 10*3/uL (ref 0.7–4.0)
MCH: 28.6 pg (ref 26.0–34.0)
MCHC: 31.7 g/dL (ref 30.0–36.0)
MCV: 90.3 fL (ref 80.0–100.0)
Monocytes Absolute: 0.5 10*3/uL (ref 0.1–1.0)
Monocytes Relative: 8 %
Neutro Abs: 2.5 10*3/uL (ref 1.7–7.7)
Neutrophils Relative %: 39 %
Platelets: 234 10*3/uL (ref 150–400)
RBC: 4.33 MIL/uL (ref 4.22–5.81)
RDW: 16 % — ABNORMAL HIGH (ref 11.5–15.5)
WBC: 6.4 10*3/uL (ref 4.0–10.5)
nRBC: 0 % (ref 0.0–0.2)

## 2023-06-13 LAB — COMPREHENSIVE METABOLIC PANEL
ALT: 53 U/L — ABNORMAL HIGH (ref 0–44)
AST: 28 U/L (ref 15–41)
Albumin: 3.6 g/dL (ref 3.5–5.0)
Alkaline Phosphatase: 79 U/L (ref 38–126)
Anion gap: 8 (ref 5–15)
BUN: 15 mg/dL (ref 6–20)
CO2: 23 mmol/L (ref 22–32)
Calcium: 9.2 mg/dL (ref 8.9–10.3)
Chloride: 110 mmol/L (ref 98–111)
Creatinine, Ser: 1.07 mg/dL (ref 0.61–1.24)
GFR, Estimated: >60 mL/min (ref >60)
Glucose, Bld: 89 mg/dL (ref 70–99)
Potassium: 4.3 mmol/L (ref 3.5–5.1)
Sodium: 141 mmol/L (ref 135–145)
Total Bilirubin: 0.5 mg/dL (ref 0.3–1.2)
Total Protein: 7.2 g/dL (ref 6.5–8.1)

## 2023-06-13 LAB — SALICYLATE LEVEL: Salicylate Lvl: <7 mg/dL — ABNORMAL LOW (ref 7.0–30.0)

## 2023-06-13 LAB — ACETAMINOPHEN LEVEL: Acetaminophen (Tylenol), Serum: <10 ug/mL — ABNORMAL LOW (ref 10–30)

## 2023-06-13 LAB — ETHANOL: Alcohol, Ethyl (B): <10 mg/dL (ref <10)

## 2023-06-13 NOTE — Telephone Encounter (Signed)
I sent a community message to [facility] for PT.The patient had a start of care date of  05-27-2023 with a missed appointment of 06-02-2023 and patient did have appointment with PT on 06-06-2023 [place], [place] C10/14/20242:42 PM

## 2023-06-13 NOTE — ED Provider Triage Note (Signed)
Emergency Medicine Provider Triage Evaluation Note  Jeffrey Fitzgerald , a 52 y.o. male  was evaluated in triage.  Pt complains of ingestion of 4 days worth of amlodipine, carvedilol, duloxetine, and atorvastatin.  Patient states that he accidentally missed all of his doses of his medications for the last 4 days and he was "trying to catch up" on the missed doses.  Patient denies SI/HI and states he had no intention of harming himself.  Patient has a home health nurse that comes out to see him and keep track of his medications, and patient was worried he was going to "get in trouble" with his nurse if she saw that he missed any doses of medication.  Denies chest pain, shortness of breath, dizziness, nausea, vomiting, or any other symptoms.  Review of Systems  Positive: As above Negative: As above  Physical Exam  BP 127/72   Pulse (!) 53   Temp 98.7 F (37.1 C)   Resp 17   SpO2 100%  Gen:   Awake, no distress   Resp:  Normal effort  MSK:   Moves extremities without difficulty  Other:    Medical Decision Making  Medically screening exam initiated at 6:28 PM.  Appropriate orders placed.  Jeffrey Fitzgerald was informed that the remainder of the evaluation will be completed by another provider, this initial triage assessment does not replace that evaluation, and the importance of remaining in the ED until their evaluation is complete.     Lenard Simmer, [place] 06/13/23 [phone]

## 2023-06-13 NOTE — ED Triage Notes (Signed)
Pt to ED POV from home. Pt states he accidentally took 4 days worth of his amlodipine, carvedilol, duloxentine, and atorvastatin. Pt states he had accidentally missed all of his doses of his medications x4 days and was trying to "catch up" on the missed doses. Pt denies SI / HI. Pt states he has a home health nurse that comes out to see him and she keeps track of his pills and pt states he did not want his home health nurse to see that he missed any doses so he took 4 doses of each medication because he has missed taking his medication for the past 4 days. Pt denies CP, SOB, dizziness, nausea, or any other symptoms. GCS 15 in triage.

## 2023-06-13 NOTE — Telephone Encounter (Signed)
Received a call from [employer] with [facility]. Stated she's concern about pt's wellbeing. She stated except for PCS aide, pt has no one else to care for him. And this is why he has cancelled appts; he has no transportation except from family. But "his family does not feel the need to take care of him". This also why pt had to cancel the colonoscopy appt - stated the family wanted a nurse to give the prep for the procedure, stay thru the night and clean pt as needed which is impossible.I asked the RN if pt has Home Health, she stated no only a PCS aide ( referral has been ordered - I will ask Mamie to f/u). And she stated if pt is ever admitted to the hospital; the family will not take him back home. Stated pt needs long-term care/facility.

## 2023-06-13 NOTE — Patient Outreach (Signed)
Medicaid Managed Care   Unsuccessful Outreach Note  06/13/2023 Name: Jeffrey Fitzgerald MRN: [id number] DOB: 1971/04/14  Referred by: [name], DO Reason for referral : High Risk Managed Medicaid (MM social work unsuccessful telephone outreach )   An unsuccessful telephone outreach was attempted today. The patient was referred to the case management team for assistance with care management and care coordination.   Follow Up Plan: The patient has been provided with contact information for the care management team and has been advised to call with any health related questions or concerns.   Abelino Derrick, [name] [facility] Health  Managed [facility] Social Worker [phone]

## 2023-06-13 NOTE — Patient Instructions (Signed)
Medicaid Managed Care   Unsuccessful Outreach Note  06/13/2023 Name: Jeffrey Fitzgerald MRN: [id number] DOB: Nov 05, 1970  Referred by: [name], DO Reason for referral : High Risk Managed Medicaid (MM social work unsuccessful telephone outreach )   An unsuccessful telephone outreach was attempted today. The patient was referred to the case management team for assistance with care management and care coordination.   Follow Up Plan: We have been unable to make contact with the patient for follow up. The care management team is available to follow up with the patient after provider conversation with the patient regarding recommendation for care management engagement and subsequent re-referral to the care management team.   [name], BSW, [facility] Health  Managed [facility] Social Worker [phone]

## 2023-06-14 ENCOUNTER — Telehealth: Payer: Self-pay | Admitting: Surgery

## 2023-06-14 ENCOUNTER — Telehealth: Payer: Self-pay

## 2023-06-14 ENCOUNTER — Telehealth: Payer: Self-pay | Admitting: *Deleted
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
## 2023-06-14 NOTE — ED Notes (Signed)
Attempted to contact Jeffrey Fitzgerald for patient's transportation home, no answer, voice mail left with return contact number.

## 2023-06-14 NOTE — ED Notes (Signed)
[facility] unable to take patient home, Charge Nurse [place] notified. Jeffrey Fitzgerald has attempted to contact the patient's Home Health nurse for possible transportation for the patient to go home. Patient refuses for [place] to contact his mother or sister for discharge. Will continue to attempt to call patient's home heath nurse.

## 2023-06-14 NOTE — ED Notes (Signed)
[facility] unable to take patient home due to "patient having other means to get home" also due to "patient having a wheelchair in which they can not properly secure". Patient states that his home health nurse [name] drove him here. Voicemail left at [employer] phone number.
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
## 2023-06-14 NOTE — ED Notes (Signed)
Attempted to call Jeffrey Fitzgerald, to transport patient home, voice mail left with return phone number.

## 2023-06-14 NOTE — ED Notes (Signed)
[facility] called unable to give pick up time

## 2023-06-14 NOTE — Patient Outreach (Signed)
Care Management   Visit Note  06/14/2023 Name: [name] MRN: [id number] DOB: [birth year]  Subjective: Jeffrey Fitzgerald is a 52 y.o. year old male who is a primary care patient of [name], DO. The Care Management team was consulted by the patient's health plan in response to a call from the patient for assistance related to discharge from the hospital today.     Per the health plan representative Raynelle Fanning [phone]), several calls were placed by the patient from his cell phone to the health plan requesting assistance because he had been discharged from the hospital and did not have transportation home and did not feel he would be able to return to the home where he previously lived with his mother and sister. I was unable to reach the patient by phone or leave a message on 2 separate attempts [phone]. I contacted the ED Charge nurse Megan [phone] and the [facility] team supervisor on call, Jiles Crocker at [phone] who further involved the Case Manager in the ED this evening. I spoke again with Aundra Millet who is working with the team to determine the most appropriate plan for the patient's care this evening.   This patient has a scheduled appointment with the RN Care Manager and is actively being followed by the High Risk ambulatory social worker who will be advised of the aforementioned care and planning for [name].   Marja Kays [name],BSN,RN,CCM Director, Population Health  [facility] Health

## 2023-06-14 NOTE — ED Notes (Signed)
Patient said he lives with his sister and it was ok for the nurse her. Rn called Jasun Gasparini (Sister) at [phone] and left a message for her to call my number.

## 2023-06-14 NOTE — ED Provider Notes (Addendum)
[place] EMERGENCY DEPARTMENT AT [facility] Provider Note   CSN: [id number] Arrival date & time: 06/13/23  1758     History  Chief Complaint  Patient presents with   Ingestion    Jeffrey Fitzgerald is a 52 y.o. male.  The history is provided by the patient.  Ingestion  He has history of hypertension, peripheral vascular disease status post bilateral above-the-knee amputations and had not taken his medications for 3 days.  He was concerned that a nurse would be upset with him, so he took 4 days worth of medication today.  His medications include amlodipine, atorvastatin, carvedilol, Probalan.  He has no complaints.  He denies abdominal pain, nausea, dyspnea, dizziness, lightheadedness, weakness, muscle pain.  He denies suicidal intent.  He does state that his living situation is not good and he feels that he needs to be in a long-term care facility.   Home Medications Prior to Admission medications   Medication Sig Start Date End Date Taking? Authorizing Provider  Acetaminophen Extra Strength 500 MG TABS Take 1 tablet (500 mg total) by mouth every 6 (six) hours as needed. 03/21/23   [name], DO  amLODipine (NORVASC) 5 MG tablet Take 1 tablet (5 mg total) by mouth daily. 03/21/23   [name], DO  aspirin EC 81 MG tablet Take 1 tablet (81 mg total) by mouth daily. Swallow whole. 05/25/23   [name], DO  atorvastatin (LIPITOR) 80 MG tablet Take 1 tablet (80 mg total) by mouth at bedtime. 03/21/23   [name], DO  carvedilol (COREG) 12.5 MG tablet Take 1 tablet (12.5 mg total) by mouth 2 (two) times daily with a meal. 03/21/23   [name], DO  docusate sodium (COLACE) 100 MG capsule Take 1 capsule (100 mg total) by mouth daily. Patient not taking: Reported on 04/27/2023 03/21/23   [name], DO  Nystatin (GERHARDT'S BUTT CREAM) CREA Apply 1 Application topically as needed for irritation (apply to periarea as needed for excoriation). 05/25/23    [name], DO  pregabalin (LYRICA) 75 MG capsule Take 1 capsule (75 mg total) by mouth 3 (three) times daily. 05/03/23 05/02/24  [name], DO      Allergies    Patient has no known allergies.    Review of Systems   Review of Systems  All other systems reviewed and are negative.   Physical Exam Updated Vital Signs BP 96/63 (BP Location: Right Arm)   Pulse 80   Temp 97.7 F (36.5 C) (Oral)   Resp 20   SpO2 98%  Physical Exam Vitals and nursing note reviewed.   52 year old male, resting comfortably and in no acute distress. Vital signs are normal. Oxygen saturation is 98%, which is normal. Head is normocephalic and atraumatic. PERRLA, EOMI. Oropharynx is clear. Neck is nontender and supple without adenopathy or JVD. Lungs are clear without rales, wheezes, or rhonchi. Chest is nontender. Heart has regular rate and rhythm without murmur. Abdomen is soft, flat, nontender. Extremities have bilateral above-the-knee amputations. Skin is warm and dry without rash. Neurologic: Mental status is normal, cranial nerves are intact, moves all extremities equally.  ED Results / Procedures / Treatments   Labs (all labs ordered are listed, but only abnormal results are displayed) Labs Reviewed  COMPREHENSIVE METABOLIC PANEL - Abnormal; Notable for the following components:      Result Value   ALT 53 (*)    All other components within normal limits  SALICYLATE LEVEL - Abnormal; Notable  for the following components:   Salicylate Lvl <7.0 (*)    All other components within normal limits  ACETAMINOPHEN LEVEL - Abnormal; Notable for the following components:   Acetaminophen (Tylenol), Serum <10 (*)    All other components within normal limits  CBC WITH DIFFERENTIAL/PLATELET - Abnormal; Notable for the following components:   Hemoglobin 12.4 (*)    RDW 16.0 (*)    All other components within normal limits  ETHANOL  RAPID URINE DRUG SCREEN, HOSP PERFORMED  I-STAT CHEM 8, ED  CBG  MONITORING, ED    EKG Interpretation Date/Time:  Monday June 13 2023 18:16:57 EDT Ventricular Rate:  63 PR Interval:  190 QRS Duration:  90 QT Interval:  384 QTC Calculation: 392 R Axis:   66  Text Interpretation: Normal sinus rhythm Minimal voltage criteria for LVH, may be normal variant ( Sokolow-Lyon ) Borderline ECG When compared with ECG of 26-May-2022 22:04, HEART RATE INCREASED SINCE has decreased Confirmed by Dione Booze ([id number]) on 06/14/2023 2:06:42 AM       Procedures Procedures  Cardiac monitor shows normal sinus rhythm, per my interpretation.  Medications Ordered in ED Medications - No data to display  ED Course/ Medical Decision Making/ A&P                                 Medical Decision Making  Accidental overdose of carvedilol, pregabalin, atorvastatin, amlodipine.  I believe that this is a nontoxic overdose.  At this point, he has been in the emergency department for over 6 hours with no evidence of any adverse effects.  Blood pressure is adequate, no altered mentation, no myalgias.  I did attempt to call poison control, but was unable to reach them despite several attempts.  Patient shows no evidence of any toxicity and I feel he is safe for discharge.  I have reviewed his electrocardiogram and my interpretation is mild left ventricular hypertrophy unchanged from prior.  I have requested transitions of care consult to assist him with placement in a long-term care facility.  Final Clinical Impression(s) / ED Diagnoses Final diagnoses:  Accidental drug overdose, initial encounter  Normochromic normocytic anemia  Elevated ALT measurement    Rx / DC Orders ED Discharge Orders     None         Dione Booze, MD 06/14/23 Jacinta Shoe    Dione Booze, MD 06/14/23 [phone]

## 2023-06-14 NOTE — Patient Outreach (Signed)
BSW contacted [employer] APS for a follow up/status of APS report and left a voicemail. BSW also contacted patient and was unsuccessful.   Jeffrey Fitzgerald, [name] [facility]  Managed [facility] Social Worker [phone]

## 2023-06-14 NOTE — Discharge Instructions (Addendum)
If you miss doses of any of your medications, do not try to catch up.  Simply take the next scheduled dose.  You need to work with a Child psychotherapist to try to find an appropriate facility for you to live at.

## 2023-06-16 ENCOUNTER — Telehealth: Payer: Self-pay | Admitting: *Deleted

## 2023-06-16 ENCOUNTER — Other Ambulatory Visit: Payer: Self-pay

## 2023-06-16 NOTE — Telephone Encounter (Signed)
RTC to [place] the SW who had returned my call.  Message was left to call the Clinics.

## 2023-06-16 NOTE — Telephone Encounter (Signed)
Call from patient's sister asking about placement for the patient.  Sister was informed that the Clinics do not locate the Facility and that she will need to look around for possible places for her brother.  Sister stated did not know that she needed to to this.  Will start to look at possible placement places for the patient.  Call to [name] SW to see iif she will call the family and inform them of her progress with locating/getting placement for the patient.  Message was left for her to call the Clinics.

## 2023-06-16 NOTE — Telephone Encounter (Signed)
RTC from [place], PT D/C Therapist with [facility]. Wanted to know if the patient is going to be placed in a Facility.  Informed Aurther Loft that placement has been started by the [facility] SW and the patient's family.  States that she saw where patient was recently in the ER for overdose and was sent back home.  Informed her that I had spoken with the patient's sister who is working with the SW to get placement for the patient.

## 2023-06-16 NOTE — Patient Outreach (Signed)
BSW received a message from Vernona Rieger, stating patients mom had called. BSW returned call to patients mother, she stated patients sister wants to sell her house and patient has to go. She states sister is disgusted with patient and wants him out. BSW reminded mom that LCSW sent facilities to her awhile ago to start the process for placement. BSW reminded mom that they have to start looking at facilities once one is located BSW will assist with getting the process started for placement. She states she still has the facilities and will start on Monday.   Abelino Derrick, [name] [facility] Health  Managed [facility] Social Worker [phone]

## 2023-06-16 NOTE — Telephone Encounter (Signed)
RTC to [facility] Nurse for patient x 2 .  No answer and unable to leave a message.

## 2023-06-21 ENCOUNTER — Telehealth: Payer: Self-pay | Admitting: Licensed Clinical Social Worker

## 2023-06-21 ENCOUNTER — Encounter: Payer: Medicaid Other | Admitting: Gastroenterology

## 2023-06-21 ENCOUNTER — Other Ambulatory Visit: Payer: Self-pay | Admitting: *Deleted

## 2023-06-21 NOTE — Patient Instructions (Signed)
Visit Information  Mr. Linkin Peggye Pitt  - as a part of your Medicaid benefit, you are eligible for care management and care coordination services at no cost or copay. I was unable to reach you by phone today but would be happy to help you with your health related needs. Please feel free to call me @ [phone].   A member of the Managed Medicaid care management team will reach out to you again over the next 7 days.   [name] RN, BSN [place]  Value-Based Care Institute [facility] Health RN Care Coordinator [phone]

## 2023-06-21 NOTE — Patient Outreach (Addendum)
Medicaid Managed Care   Unsuccessful Attempt Note   06/21/2023 Name: Jeffrey Fitzgerald MRN: [id number] DOB: Mar 27, 1971  Referred by: [name], DO Reason for referral : No chief complaint on file.   An unsuccessful telephone outreach was attempted today. The patient was referred to the case management team for assistance with care management and care coordination.  Voice mail box full.  [facility] LCSW, [facility] RNCM and [facility] BSW completed care coordination discussion today regarding patient's need for placement. Dorene Sorrow his APS worker, is working on getting patient connected with the placement Child psychotherapist at [employer]. [facility] team has been unsuccessful in reaching patient and both [facility] LCSW and [facility] RNCM were unable to reach patient successfully today. Multiple unsuccessful outreaches have been made by entire team including [facility]/[place] Engineer, structural.  [name], BSW, MSW, [name] Managed Medicaid LCSW [facility]  [name] [email or web address] Phone: [phone]

## 2023-06-21 NOTE — Patient Outreach (Signed)
Medicaid Managed Care   Unsuccessful Attempt Note   06/21/2023 Name: Jeffrey Fitzgerald MRN: [id number] DOB: 05-May-1971  Referred by: [name], DO Reason for referral : High Risk Managed Medicaid (Unsuccessful RNCM follow up telephone outreach)   An unsuccessful telephone outreach was attempted today. The patient was referred to the case management team for assistance with care management and care coordination.    Attempted to reach [name] on primary number multiple times. Attempted to reach patient on alternate number patient's mother answered this number. She is not with [name], and did not have an alternate contact number, stating "If he doesn't answer, that's on him" . RNCM attempted to reach Emergency contact-Gina, a voicemail was left, requesting patient return call to [facility].  Follow Up Plan: A HIPAA compliant phone message was left for the patient providing contact information and requesting a return call.    [name] RN, BSN [place]  Value-Based Care Institute [facility] Health RN Care Coordinator [phone]

## 2023-06-23 ENCOUNTER — Other Ambulatory Visit: Payer: Self-pay

## 2023-06-23 NOTE — Patient Outreach (Signed)
Medicaid Managed Care Social Work Note  06/23/2023 Name:  Jeffrey Fitzgerald MRN:  [id number] DOB:  March 19, 1971  Jeffrey Fitzgerald is an 52 y.o. year old male who is a primary patient of [name], DO.  The [facility] Managed Care Coordination team was consulted for assistance with:  Level of Care Concerns  Jeffrey Fitzgerald was given information about Medicaid Managed Care Coordination team services today. Jeffrey Fitzgerald Patient and Parent agreed to services and verbal consent obtained.  Engaged with patient  for by telephone forfollow up visit in response to referral for case management and/or care coordination services.   Assessments/Interventions:  Review of past medical history, allergies, medications, health status, including review of consultants reports, laboratory and other test data, was performed as part of comprehensive evaluation and provision of chronic care management services.  SDOH: (Social Determinant of Health) assessments and interventions performed: SDOH Interventions    Flowsheet Row Patient Outreach Telephone from 05/03/2023 in [place] POPULATION HEALTH DEPARTMENT Patient Outreach Telephone from 04/26/2023 in [place] POPULATION HEALTH DEPARTMENT Patient Outreach Telephone from 04/19/2023 in [place] POPULATION HEALTH DEPARTMENT Patient Outreach Telephone from 03/21/2023 in [place] POPULATION HEALTH DEPARTMENT Patient Outreach Telephone from 03/07/2023 in [place] POPULATION HEALTH DEPARTMENT Patient Outreach Telephone from 02/04/2023 in [place] POPULATION HEALTH DEPARTMENT  SDOH Interventions        Food Insecurity Interventions Intervention Not Indicated -- -- -- -- --  Housing Interventions Intervention Not Indicated -- -- -- -- --  Transportation Interventions Intervention Not Indicated -- -- -- -- --  Utilities Interventions Intervention Not Indicated -- -- -- -- --  Physical Activity Interventions -- -- -- -- Other (Comments)  [PCP message sent for PT  request from family] --  Stress Interventions -- [employer], Provide Counseling Offered [employer], Provide Counseling  [Family still are seeking pain relief for patient] [employer], Provide Counseling  [Due to no medication refills] Provide Counseling, Other (Comment)  [SNF placement education] [employer], Provide Counseling     BSW completed a telephone outreach with patient and his mother. He states he is at home and still needs somewhere to go. BSW informed patient information was sent to his mother to located placement and BSW would assist with getting the process started. Patient then put his mother on the phone, BSW asked mom if she started her search for facilities, per our conversation on last week. She states she lost the resources and would like for them to be mailed to her. BSW will mail facilities.   Advanced Directives Status:  Not addressed in this encounter.  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   Depression 06/09/2022   Constipation 06/09/2022   S/P AKA (above knee amputation) bilateral (HCC) 06/09/2022   Malnutrition of moderate degree 05/29/2022   Muscle weakness of left upper extremity 05/27/2022   Tobacco dependence 05/27/2022  Adjustment reaction 05/27/2022   DNR (do not resuscitate) 05/27/2022   Acute postoperative anemia due to expected blood loss     Gastroesophageal reflux disease    Pressure injury of skin 04/13/2022   Anemia    PAD (peripheral artery disease) (HCC) 04/07/2022   Critical limb ischemia of both lower extremities (HCC) 04/01/2022   Iliac artery occlusion, 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   Follow up:  Patient agrees to Care Plan and Follow-up.  Plan: The Managed Medicaid care management team will reach out to the patient again over the next 07/07/23 days.  Date/time of next scheduled Social Work care management/care coordination outreach:07/07/23    [name], BSW, [facility] Health  Managed [facility] Social Worker [phone]

## 2023-06-23 NOTE — Patient Instructions (Signed)
Visit Information  Mr. Corbit was given information about Medicaid Managed Care team care coordination services as a part of their [facility] Community Plan Medicaid benefit. Lorcan Weisenberg verbally consented to engagement with the [facility] Managed Care team.   If you are experiencing a medical emergency, please call 911 or report to your local emergency department or urgent care.   If you have a non-emergency medical problem during routine business hours, please contact your provider's office and ask to speak with a nurse.   For questions related to your [facility], please call: [phone] or visit the homepage here: [email or web address]  If you would like to schedule transportation through your [facility], please call the following number at least 2 days in advance of your appointment: [phone]   Rides for urgent appointments can also be made after hours by calling Member Services.  Call the Behavioral Health Crisis Line at [phone], at any time, 24 hours a day, 7 days a week. If you are in danger or need immediate medical attention call 911.  If you would like help to quit smoking, call 1-800-QUIT-NOW (640-818-5251) OR Espaol: 1-855-Djelo-Ya (1-324-401-0272) o para ms informacin haga clic aqu or Text READY to 536-644 to register via text  Mr. Witmer - following are the goals we discussed in your visit today:   Goals Addressed   None      Social Worker will follow up on 07/07/23.   [name], [name], [name] [facility] Health  Managed Medicaid Social Worker [phone]   Following is a copy of your plan of care:  There are no care plans that you recently modified to display for this patient.

## 2023-06-24 ENCOUNTER — Telehealth: Payer: Self-pay | Admitting: *Deleted

## 2023-06-24 ENCOUNTER — Other Ambulatory Visit: Payer: Self-pay

## 2023-06-24 NOTE — Patient Outreach (Signed)
BSW received a message from [place] that patients mother was trying to get in touch with her. BSW contacted patients mother and she stated she called [facility] and they wanted to send patient to the [facility] for pros ethics but he needs PT.    Abelino Derrick, [name] [facility] Health  Managed [facility] Social Worker [phone]

## 2023-06-24 NOTE — Patient Outreach (Signed)
Care Management   Note  06/24/2023 Name: Shariff Corneau MRN: [id number] DOB: 1970/10/29  Crew Rzepecki is enrolled in a Managed Medicaid plan: Yes. Outreach attempt today was unsuccessful.   The care management team will reach out to the patient again over the next 7 days.   [name] RN, BSN [place]  Value-Based Care Institute [facility] Health RN Care Coordinator [phone]

## 2023-07-01 ENCOUNTER — Other Ambulatory Visit: Payer: Self-pay | Admitting: *Deleted

## 2023-07-01 NOTE — Patient Instructions (Signed)
Visit Information  Jeffrey Fitzgerald was given information about Medicaid Managed Care team care coordination services as a part of their [facility] Community Plan Medicaid benefit. Jeffrey Fitzgerald verbally consented to engagement with the [facility] Managed Care team.   If you are experiencing a medical emergency, please call 911 or report to your local emergency department or urgent care.   If you have a non-emergency medical problem during routine business hours, please contact your provider's office and ask to speak with a nurse.   For questions related to your [facility], please call: [phone] or visit the homepage here: [email or web address]  If you would like to schedule transportation through your [facility], please call the following number at least 2 days in advance of your appointment: [phone]   Rides for urgent appointments can also be made after hours by calling Member Services.  Call the Behavioral Health Crisis Line at [phone], at any time, 24 hours a day, 7 days a week. If you are in danger or need immediate medical attention call 911.  If you would like help to quit smoking, call 1-800-QUIT-NOW (732-276-3462) OR Espaol: 1-855-Djelo-Ya (1-607-371-0626) o para ms informacin haga clic aqu or Text READY to 948-546 to register via text  Jeffrey Fitzgerald,   Please see education materials related to preventive care provided as print materials.   The patient verbalized understanding of instructions, educational materials, and care plan provided today and agreed to receive a mailed copy of patient instructions, educational materials, and care plan.   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 He moved to [place] to live with his son 2 days ago. His son will work to find placement in an ALF.   RNCM Clinical Goal(s):  Patient will verbalize understanding of plan for management of Bilat AKA as evidenced by patient reports take all medications exactly as prescribed and will call provider for medication related questions as evidenced by patient reports    attend all scheduled medical appointments: 08/02/23 with [facility] as evidenced by provider documentation        continue to work with RN Care Manager and/or Social Worker to address care management and care coordination needs related to bilat AKA as evidenced by adherence to CM Team Scheduled appointments     work with Child psychotherapist to address Level of care concerns related to the management of Bilat AKA as evidenced by review of EMR and patient or Child psychotherapist report     through collaboration with Medical illustrator, provider, and care team.   Interventions: Evaluation of current treatment plan related to  self management and patient's adherence to plan as established by provider   Bilat AKA  (Status: Goal on Track (progressing): YES.) Long Term  Goal  Evaluation of current treatment plan related to  Bilat AKA ,  self-management and patient's adherence to plan as established by provider. Discussed plans with patient for ongoing care management follow up and provided patient with direct contact information for care management  team Provided education to patient re: exercises to do while sitting; Reviewed medications with patient and discussed advised patient to contact a local pharmacy and have prescriptions transferred; Reviewed scheduled/upcoming provider appointments including 08/02/23 with [facility]-may need to cancel; Assessed social determinant of health barriers;  Discussed the importance in keeping his body healthy and strong, advised patient to work on strengthening his upper body-discussed healthy foods Advised patient to get out of the bed everyday, explained the benefits of changing position Explained to patient to have assistance with toileting at least 4-5 times a day or more if needed Advised patient to establish care with a PCP in [place] if this will be his permanent residence Advised patient to contact Social Security Administration [phone] for disability status RNCM will follow up with patient in 2 weeks to ensure he is working on Nurse, learning disability with MM team regarding patient's new residence   Patient Goals/Self-Care Activities: Attend all scheduled provider appointments Call provider office for new concerns or questions  Work with the social worker to address care coordination needs and will continue to work with the clinical team to address health care and disease management related needs Work on strengthening upper body

## 2023-07-01 NOTE — Patient Outreach (Signed)
Medicaid Managed Care   Nurse Care Manager Note  07/01/2023 Name:  Jeffrey Fitzgerald MRN:  [id number] DOB:  1971/07/12  Jeffrey Fitzgerald is an 52 y.o. year old male who is a primary patient of Jeffrey Morel, DO.  The [facility] Managed Care Coordination team was consulted for assistance with:    Bilat AKA  Jeffrey Fitzgerald was given information about Medicaid Managed Care Coordination team services today. Jeffrey Fitzgerald Patient agreed to services and verbal consent obtained.  Engaged with patient by telephone for follow up visit in response to provider referral for case management and/or care coordination services.   Assessments/Interventions:  Review of past medical history, allergies, medications, health status, including review of consultants reports, laboratory and other test data, was performed as part of comprehensive evaluation and provision of chronic care management services.  SDOH (Social Determinants of Health) assessments and interventions performed: SDOH Interventions    Flowsheet Row Patient Outreach Telephone from 05/03/2023 in [place] POPULATION HEALTH DEPARTMENT Patient Outreach Telephone from 04/26/2023 in [place] POPULATION HEALTH DEPARTMENT Patient Outreach Telephone from 04/19/2023 in [place] POPULATION HEALTH DEPARTMENT Patient Outreach Telephone from 03/21/2023 in [place] POPULATION HEALTH DEPARTMENT Patient Outreach Telephone from 03/07/2023 in [place] POPULATION HEALTH DEPARTMENT Patient Outreach Telephone from 02/04/2023 in [place] POPULATION HEALTH DEPARTMENT  SDOH Interventions        Food Insecurity Interventions Intervention Not Indicated -- -- -- -- --  Housing Interventions Intervention Not Indicated -- -- -- -- --  Transportation Interventions Intervention Not Indicated -- -- -- -- --  Utilities Interventions Intervention Not Indicated -- -- -- -- --  Physical Activity Interventions -- -- -- -- Other (Comments)  [PCP message sent for PT request  from family] --  Stress Interventions -- [employer], Provide Counseling Offered [employer], Provide Counseling  [Family still are seeking pain relief for patient] [employer], Provide Counseling  [Due to no medication refills] Provide Counseling, Other (Comment)  [SNF placement education] [employer], Provide Counseling       Care Plan  No Known Allergies  Medications Reviewed Today     Reviewed by Heidi Dach, RN (Registered Nurse) on 07/01/23 at 1130  Med List Status: <None>   Medication Order Taking? Sig Documenting Provider Last Dose Status Informant  Acetaminophen Extra Strength 500 MG TABS [id number] Yes Take 1 tablet (500 mg total) by mouth every 6 (six) hours as needed. Jeffrey Morel, DO Taking Active   amLODipine (NORVASC) 5 MG tablet [id number] Yes Take 1 tablet (5 mg total) by mouth daily. Jeffrey Morel, DO Taking Active   aspirin EC 81 MG tablet [id number] Yes Take 1 tablet (81 mg total) by mouth daily. Swallow whole. Jeffrey Morel, DO Taking Active   atorvastatin (LIPITOR) 80 MG tablet [id number] No Take 1 tablet (80 mg total) by mouth at bedtime.  Patient not taking: Reported on 07/01/2023   Jeffrey Morel, DO Not Taking Active   carvedilol (COREG) 12.5 MG tablet [id number] Yes Take 1 tablet (12.5 mg total) by mouth 2 (two) times daily with a meal. Jeffrey Morel, DO Taking Active   docusate sodium (COLACE) 100 MG capsule [id number] No Take 1 capsule (100 mg total) by mouth daily.  Patient not taking: Reported on 04/27/2023   Jeffrey Morel, DO Not Taking Active   Nystatin (GERHARDT'S BUTT CREAM) CREA [id number] Yes Apply 1 Application topically as needed for irritation (apply to periarea as needed for excoriation). Jeffrey Morel, DO Taking Active  pregabalin (LYRICA) 75 MG capsule [id number] No Take 1 capsule (75 mg total) by mouth 3 (three) times daily.  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/29/2022   Depression 06/09/2022   Constipation 06/09/2022   S/P AKA (above knee amputation) bilateral (HCC) 06/09/2022   Malnutrition of moderate degree 05/29/2022   Muscle weakness of left upper extremity 05/27/2022   Tobacco dependence 05/27/2022   Adjustment reaction 05/27/2022   DNR (do not resuscitate) 05/27/2022   Acute postoperative anemia due to expected blood loss    Gastroesophageal reflux disease    Pressure injury of skin 04/13/2022   Anemia    PAD (peripheral artery disease) (HCC) 04/07/2022   Critical limb ischemia of both lower extremities (HCC) 04/01/2022   Iliac artery occlusion, 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 He moved to [place] to live with his son 2 days ago. His son will work to find placement in an ALF.   RNCM Clinical Goal(s):  Patient will verbalize understanding of plan for management of Bilat AKA as evidenced by patient reports take  all medications exactly as prescribed and will call provider for medication related questions as evidenced by patient reports    attend all scheduled medical appointments: 08/02/23 with [facility] as evidenced by provider documentation        continue to work with RN Care Manager and/or Social Worker to address care management and care coordination needs related to bilat AKA as evidenced by adherence to CM Team Scheduled appointments     work with Child psychotherapist to address Level of care concerns related to the management of Bilat AKA as evidenced by review of EMR and patient or Child psychotherapist report     through collaboration with Medical illustrator, provider, and care team.   Interventions: Evaluation of current treatment plan related to  self management and patient's adherence to plan as established by provider   Bilat AKA  (Status: Goal on Track (progressing): YES.) Long Term Goal  Evaluation of current treatment plan related to  Bilat AKA ,  self-management and patient's adherence to plan as established by provider. Discussed plans with patient for ongoing care management follow up and provided patient with direct contact information for care management team Provided education to patient re: exercises to do while sitting; Reviewed medications with patient and discussed advised patient to contact a local pharmacy and  have prescriptions transferred; Reviewed scheduled/upcoming provider appointments including 08/02/23 with [facility]-may need to cancel; Assessed social determinant of health barriers;  Discussed the importance in keeping his body healthy and strong, advised patient to work on strengthening his upper body-discussed healthy foods Advised patient to get out of the bed everyday, explained the benefits of changing position Explained to patient to have assistance with toileting at least 4-5 times a day or more if needed Advised patient to establish care with a PCP in [place] if this will be his permanent  residence Advised patient to contact Social Security Administration [phone] for disability status RNCM will follow up with patient in 2 weeks to ensure he is working on Nurse, learning disability with MM team regarding patient's new residence   Patient Goals/Self-Care Activities: Attend all scheduled provider appointments Call provider office for new concerns or questions  Work with the social worker to address care coordination needs and will continue to work with the clinical team to address health care and disease management related needs Work on strengthening upper body       Follow Up:  Patient agrees to Care Plan and Follow-up.  Plan: The Managed Medicaid care management team will reach out to the patient again over the next 14 days.  Date/time of next scheduled RN care management/care coordination outreach:  07/18/23 at 11:15am  [name] RN, BSN [place]  Value-Based Care Institute [facility] Health RN Care Coordinator [phone]

## 2023-07-07 ENCOUNTER — Other Ambulatory Visit: Payer: Self-pay

## 2023-07-07 NOTE — Patient Instructions (Signed)
Visit Information  Jeffrey Fitzgerald was given information about Medicaid Managed Care team care coordination services as a part of their [facility] Community Plan Medicaid benefit. Jeffrey Fitzgerald verbally consented to engagement with the [facility] Managed Care team.   If you are experiencing a medical emergency, please call 911 or report to your local emergency department or urgent care.   If you have a non-emergency medical problem during routine business hours, please contact your provider's office and ask to speak with a nurse.   For questions related to your [facility], please call: [phone] or visit the homepage here: [email or web address]  If you would like to schedule transportation through your [facility], please call the following number at least 2 days in advance of your appointment: [phone]   Rides for urgent appointments can also be made after hours by calling Member Services.  Call the Behavioral Health Crisis Line at [phone], at any time, 24 hours a day, 7 days a week. If you are in danger or need immediate medical attention call 911.  If you would like help to quit smoking, call 1-800-QUIT-NOW (502 524 1740) OR Espaol: 1-855-Djelo-Ya (1-324-401-0272) o para ms informacin haga clic aqu or Text READY to 536-644 to register via text  Jeffrey Fitzgerald - following are the goals we discussed in your visit today:   Goals Addressed   None      The  Patient                                              has been provided with contact information for the Managed Medicaid care management team and has been advised to call with any health related questions or concerns.   Jeffrey Fitzgerald, Jeffrey Fitzgerald, [name] [facility] Health  Managed Medicaid Social Worker [phone]   Following is a copy of your plan of care:  There are no care plans that you recently modified to display  for this patient.

## 2023-07-07 NOTE — Patient Outreach (Signed)
Medicaid Managed Care Social Work Note  07/07/2023 Name:  Jeffrey Fitzgerald MRN:  [id number] DOB:  Nov 19, 1970  Jeffrey Fitzgerald is an 52 y.o. year old male who is a primary patient of Jeffrey Morel, DO.  The [facility] Managed Care Coordination team was consulted for assistance with:  Level of Care Concerns  [name] was given information about Medicaid Managed Care Coordination team services today. Jeffrey Fitzgerald Patient agreed to services and verbal consent obtained.  Engaged with patient  for by telephone forfollow up visit in response to referral for case management and/or care coordination services.   Assessments/Interventions:  Review of past medical history, allergies, medications, health status, including review of consultants reports, laboratory and other test data, was performed as part of comprehensive evaluation and provision of chronic care management services.  SDOH: (Social Determinant of Health) assessments and interventions performed: SDOH Interventions    Flowsheet Row Patient Outreach Telephone from 05/03/2023 in [place] POPULATION HEALTH DEPARTMENT Patient Outreach Telephone from 04/26/2023 in [place] POPULATION HEALTH DEPARTMENT Patient Outreach Telephone from 04/19/2023 in [place] POPULATION HEALTH DEPARTMENT Patient Outreach Telephone from 03/21/2023 in [place] POPULATION HEALTH DEPARTMENT Patient Outreach Telephone from 03/07/2023 in [place] POPULATION HEALTH DEPARTMENT Patient Outreach Telephone from 02/04/2023 in [place] POPULATION HEALTH DEPARTMENT  SDOH Interventions        Food Insecurity Interventions Intervention Not Indicated -- -- -- -- --  Housing Interventions Intervention Not Indicated -- -- -- -- --  Transportation Interventions Intervention Not Indicated -- -- -- -- --  Utilities Interventions Intervention Not Indicated -- -- -- -- --  Physical Activity Interventions -- -- -- -- Other (Comments)  [PCP message sent for PT request from  family] --  Stress Interventions -- [employer], Provide Counseling Offered [employer], Provide Counseling  [Family still are seeking pain relief for patient] [employer], Provide Counseling  [Due to no medication refills] Provide Counseling, Other (Comment)  [SNF placement education] [employer], Provide Counseling     BSW completed a telephone outreach with patient, he states he now lives in [place] with his son and his son is now taking care of him. His son will also start looking for placement in [place] for patient. He states he will not be coming back to [place]. No other resources are needed at this time. BSW provided patient with her contact number for future needs.   Advanced Directives Status:  Not addressed in this encounter.  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   Depression 06/09/2022   Constipation 06/09/2022   S/P AKA (above knee amputation) bilateral (HCC) 06/09/2022   Malnutrition of moderate degree 05/29/2022   Muscle weakness of left upper extremity 05/27/2022   Tobacco dependence 05/27/2022   Adjustment reaction 05/27/2022   DNR (do not resuscitate) 05/27/2022   Acute postoperative anemia due to expected blood loss  Gastroesophageal reflux disease    Pressure injury of skin 04/13/2022   Anemia    PAD (peripheral artery disease) (HCC) 04/07/2022   Critical limb  ischemia of both lower extremities (HCC) 04/01/2022   Iliac artery occlusion, 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   Follow up:  Patient agrees to Care Plan and Follow-up.  Plan: The  Patient has been provided with contact information for the Managed Medicaid care management team and has been advised to call with any health related questions or concerns.   Jeffrey Fitzgerald, [name] [facility] Health  Managed [facility] Social Worker [phone]

## 2023-07-15 ENCOUNTER — Other Ambulatory Visit: Payer: Self-pay | Admitting: *Deleted

## 2023-07-15 DIAGNOSIS — I739 Peripheral vascular disease, unspecified: Secondary | ICD-10-CM

## 2023-07-15 DIAGNOSIS — I745 Embolism and thrombosis of iliac artery: Secondary | ICD-10-CM

## 2023-07-15 DIAGNOSIS — I70223 Atherosclerosis of native arteries of extremities with rest pain, bilateral legs: Secondary | ICD-10-CM

## 2023-07-15 DIAGNOSIS — Z95828 Presence of other vascular implants and grafts: Secondary | ICD-10-CM

## 2023-07-18 ENCOUNTER — Other Ambulatory Visit: Payer: Self-pay | Admitting: *Deleted

## 2023-07-18 NOTE — Patient Outreach (Signed)
Care Management/Care Coordination  RN Case Manager Case Closure Note  07/18/2023 Name: Jeffrey Fitzgerald MRN: [id number] DOB: 11-19-1970  Jeffrey Fitzgerald is a 52 y.o. year old male who is a primary care patient of [name], DO. The care management/care coordination team was consulted for assistance with chronic disease management and/or care coordination needs.   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 He is living with his son in [place] and working to establish care with PCP.   RNCM Clinical Goal(s):  Patient will verbalize understanding of plan for management of Bilat AKA as evidenced by patient reports take all medications exactly as prescribed and will call provider for medication related questions as evidenced by patient reports    attend all scheduled medical appointments: 08/02/23 with [facility] as evidenced by provider documentation        continue to work with RN Care Manager and/or Social Worker to address care management and care coordination needs related to bilat AKA as evidenced by adherence to CM Team Scheduled appointments     work with Child psychotherapist to address Level of care concerns related to the management of Bilat AKA as evidenced by review of EMR and patient or Child psychotherapist report     through collaboration with Medical illustrator, provider, and care team.   Interventions: Evaluation of current treatment plan related to  self management and patient's adherence to plan as established by provider Address  updated in Epic   Bilat AKA  (Status: Goal on Track (progressing): YES.) Long Term Goal  Evaluation of current treatment plan related to  Bilat AKA ,  self-management and patient's adherence to plan as established by provider. Discussed plans with patient for ongoing care management follow up and provided patient with direct contact information for care management team Provided education to patient re: exercises to do while sitting; Reviewed medications with patient and discussed advised patient to contact a local pharmacy and have prescriptions transferred; Reviewed scheduled/upcoming provider appointments including 08/02/23 with [facility]-patient will need to cancel; Assessed social determinant of health barriers;  Discussed the importance in keeping his body healthy and strong, advised patient to work on strengthening his upper body-discussed healthy foods Advised patient to get out of the bed everyday, explained the benefits of changing position Explained to patient to have assistance with toileting at least 4-5 times a day or more if needed Advised patient to establish care with a PCP in [place] if this will be his permanent residence Advised patient to contact Social Security Administration [phone] for disability status Provided patient with [facility] Social Services [phone] to call about applying for Medicaid and in home services Advised patient to call [phone] to cancel upcoming appointments on 08/02/23 with [facility] Discuss case closure    Patient Goals/Self-Care Activities: Attend all scheduled provider appointments Call provider office for new concerns or questions  Work with the social worker to address care coordination needs and will continue to work with the clinical team to address health care  and disease management related needs Work on strengthening upper body       Plan: All appropriate care management assessments have been performed and a  patient centered care plan has been developed in collaboration with the patient subsequent to referral for care management services. The patient has been provided with exhaustive education and care management resources but has not met established and adjusted patient centered goals. The care management team has no additional activities or interventions to offer at this time.  Should new needs arise, the care management team is available to collaborate with the provider, care team and patient. The patient has been provided contact information for the care management team.  [name] RN, BSN [place]  Value-Based Care [facility] RN Care Coordinator [phone]

## 2023-07-18 NOTE — Patient Instructions (Signed)
Visit Information  Jeffrey Fitzgerald was given information about Medicaid Managed Care team care coordination services as a part of their [facility] Community Plan Medicaid benefit. Jeffrey Fitzgerald verbally consented to engagement with the [facility] Managed Care team.   If you are experiencing a medical emergency, please call 911 or report to your local emergency department or urgent care.   If you have a non-emergency medical problem during routine business hours, please contact your provider's office and ask to speak with a nurse.   For questions related to your [facility], please call: [phone] or visit the homepage here: [email or web address]  If you would like to schedule transportation through your [facility], please call the following number at least 2 days in advance of your appointment: [phone]   Rides for urgent appointments can also be made after hours by calling Member Services.  Call the Behavioral Health Crisis Line at [phone], at any time, 24 hours a day, 7 days a week. If you are in danger or need immediate medical attention call 911.  If you would like help to quit smoking, call 1-800-QUIT-NOW (272-706-6894) OR Espaol: 1-855-Djelo-Ya (6-644-034-7425) o para ms informacin haga clic aqu or Text READY to 956-387 to register via text  Mr. Bryner - following are the goals we discussed in your visit today:   Goals Addressed   None     Please see education materials related to health maintenance provided by MyChart link. and as Financial risk analyst.   The patient verbalized understanding of instructions, educational materials, and care plan provided today and agreed to receive a mailed copy of patient instructions, educational materials, and care plan.   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 He is living with his son in [place] and working to establish care with PCP.   RNCM Clinical Goal(s):  Patient will verbalize understanding of plan for management of Bilat AKA as evidenced by patient reports take all medications exactly as prescribed and will call provider for medication related questions as evidenced by patient reports    attend all scheduled medical appointments: 08/02/23 with [facility] as evidenced by provider documentation        continue to work with RN Care Manager and/or Social Worker to address care management and care coordination needs related to bilat AKA as evidenced by adherence to CM Team Scheduled appointments     work with Child psychotherapist to address Level of care concerns related to the management of Bilat AKA as evidenced by review of EMR and patient or Child psychotherapist report     through collaboration with Medical illustrator, provider, and care team.   Interventions: Evaluation of current treatment plan related to  self management and patient's adherence to plan as established by provider Address  updated in Epic   Bilat AKA  (Status: Goal on Track (progressing): YES.) Long Term Goal  Evaluation of current treatment plan related to  Bilat AKA ,  self-management and patient's adherence to plan as established by provider. Discussed plans with patient for  ongoing care management follow up and provided patient with direct contact information for care management team Provided education to patient re: exercises to do while sitting; Reviewed medications with patient and discussed advised patient to contact a local pharmacy and have prescriptions transferred; Reviewed scheduled/upcoming provider appointments including 08/02/23 with [facility]-patient will need to cancel; Assessed social determinant of health barriers;  Discussed the importance in keeping his body healthy and strong, advised patient to work on strengthening his upper body-discussed healthy foods Advised patient to get out of the bed everyday, explained the benefits of changing position Explained to patient to have assistance with toileting at least 4-5 times a day or more if needed Advised patient to establish care with a PCP in [place] if this will be his permanent residence Advised patient to contact Social Security Administration [phone] for disability status Provided patient with [facility] Social Services [phone] to call about applying for Medicaid and in home services Advised patient to call [phone] to cancel upcoming appointments on 08/02/23 with [facility] Discuss case closure    Patient Goals/Self-Care Activities: Attend all scheduled provider appointments Call provider office for new concerns or questions  Work with the social worker to address care coordination needs and will continue to work with the clinical team to address health care and disease management related needs Work on strengthening upper body

## 2023-07-26 ENCOUNTER — Encounter (HOSPITAL_COMMUNITY): Payer: Medicaid Other

## 2023-07-26 ENCOUNTER — Ambulatory Visit: Payer: Medicaid Other

## 2023-08-02 ENCOUNTER — Ambulatory Visit (HOSPITAL_COMMUNITY): Payer: Medicaid Other

## 2023-08-02 ENCOUNTER — Ambulatory Visit: Payer: Medicaid Other

## 2023-08-15 ENCOUNTER — Encounter: Payer: Medicaid Other | Admitting: Student

## 2023-09-13 ENCOUNTER — Telehealth: Payer: Self-pay | Admitting: *Deleted

## 2023-09-13 NOTE — Telephone Encounter (Signed)
 Unable to contact patient/ called Andrea-contact for [facility] who was working with this patient/  Sue Lush stated that the patient has moved to [place] with family according the [facility] agency.

## 2024-05-17 ENCOUNTER — Telehealth: Payer: Self-pay

## 2024-05-17 NOTE — Telephone Encounter (Signed)
 Patient last seen 05/03/23 I called the patient to schedule a appointment. Unable to reach the patient, mailbox full.
